# Patient Record
Sex: Female | Born: 1950 | Race: Black or African American | Hispanic: No | Smoking: Current every day smoker
Health system: Southern US, Community
[De-identification: ages and names within clinical notes are randomized; demographics above are authoritative.]

## PROBLEM LIST (undated history)

## (undated) DIAGNOSIS — I10 Essential (primary) hypertension: Secondary | ICD-10-CM

## (undated) DIAGNOSIS — M1990 Unspecified osteoarthritis, unspecified site: Secondary | ICD-10-CM

## (undated) DIAGNOSIS — I4891 Unspecified atrial fibrillation: Secondary | ICD-10-CM

## (undated) DIAGNOSIS — R32 Unspecified urinary incontinence: Secondary | ICD-10-CM

## (undated) DIAGNOSIS — R0600 Dyspnea, unspecified: Secondary | ICD-10-CM

## (undated) DIAGNOSIS — J449 Chronic obstructive pulmonary disease, unspecified: Secondary | ICD-10-CM

## (undated) DIAGNOSIS — C801 Malignant (primary) neoplasm, unspecified: Secondary | ICD-10-CM

## (undated) DIAGNOSIS — F329 Major depressive disorder, single episode, unspecified: Secondary | ICD-10-CM

## (undated) DIAGNOSIS — F32A Depression, unspecified: Secondary | ICD-10-CM

## (undated) DIAGNOSIS — E119 Type 2 diabetes mellitus without complications: Secondary | ICD-10-CM

## (undated) HISTORY — DX: Unspecified urinary incontinence: R32

## (undated) HISTORY — PX: CHOLECYSTECTOMY: SHX55

## (undated) HISTORY — PX: FRACTURE SURGERY: SHX138

---

## 2002-06-04 ENCOUNTER — Encounter: Payer: Self-pay | Admitting: *Deleted

## 2002-06-04 ENCOUNTER — Ambulatory Visit (HOSPITAL_COMMUNITY): Admission: RE | Admit: 2002-06-04 | Discharge: 2002-06-04 | Payer: Self-pay | Admitting: *Deleted

## 2003-12-07 ENCOUNTER — Ambulatory Visit (HOSPITAL_COMMUNITY): Admission: RE | Admit: 2003-12-07 | Discharge: 2003-12-07 | Payer: Self-pay | Admitting: Family Medicine

## 2011-06-22 DIAGNOSIS — J9859 Other diseases of mediastinum, not elsewhere classified: Secondary | ICD-10-CM | POA: Insufficient documentation

## 2011-06-22 DIAGNOSIS — S065XAA Traumatic subdural hemorrhage with loss of consciousness status unknown, initial encounter: Secondary | ICD-10-CM | POA: Insufficient documentation

## 2011-06-22 DIAGNOSIS — S2249XA Multiple fractures of ribs, unspecified side, initial encounter for closed fracture: Secondary | ICD-10-CM | POA: Insufficient documentation

## 2011-06-23 DIAGNOSIS — S22009A Unspecified fracture of unspecified thoracic vertebra, initial encounter for closed fracture: Secondary | ICD-10-CM | POA: Insufficient documentation

## 2012-12-17 ENCOUNTER — Encounter (HOSPITAL_COMMUNITY): Payer: Self-pay | Admitting: *Deleted

## 2012-12-17 ENCOUNTER — Emergency Department (HOSPITAL_COMMUNITY)
Admission: EM | Admit: 2012-12-17 | Discharge: 2012-12-17 | Disposition: A | Discharge status: Home / Self Care | Payer: Medicaid Other | Attending: Emergency Medicine | Admitting: Emergency Medicine

## 2012-12-17 DIAGNOSIS — E119 Type 2 diabetes mellitus without complications: Secondary | ICD-10-CM | POA: Insufficient documentation

## 2012-12-17 DIAGNOSIS — F329 Major depressive disorder, single episode, unspecified: Secondary | ICD-10-CM | POA: Insufficient documentation

## 2012-12-17 DIAGNOSIS — F3289 Other specified depressive episodes: Secondary | ICD-10-CM | POA: Insufficient documentation

## 2012-12-17 DIAGNOSIS — I10 Essential (primary) hypertension: Secondary | ICD-10-CM | POA: Insufficient documentation

## 2012-12-17 DIAGNOSIS — Z79899 Other long term (current) drug therapy: Secondary | ICD-10-CM | POA: Insufficient documentation

## 2012-12-17 DIAGNOSIS — F17200 Nicotine dependence, unspecified, uncomplicated: Secondary | ICD-10-CM | POA: Insufficient documentation

## 2012-12-17 HISTORY — DX: Major depressive disorder, single episode, unspecified: F32.9

## 2012-12-17 HISTORY — DX: Depression, unspecified: F32.A

## 2012-12-17 LAB — BASIC METABOLIC PANEL
CO2: 31 mEq/L (ref 19–32)
Calcium: 9.7 mg/dL (ref 8.4–10.5)
Creatinine, Ser: 0.73 mg/dL (ref 0.50–1.10)
GFR calc Af Amer: >90 mL/min (ref >90)
GFR calc non Af Amer: >90 mL/min (ref >90)

## 2012-12-17 MED ORDER — HYDROCHLOROTHIAZIDE 12.5 MG PO TABS: 25.0000 mg | ORAL_TABLET | Freq: Every day | ORAL | Status: DC

## 2012-12-17 MED ORDER — AMLODIPINE BESYLATE 5 MG PO TABS: 10.0000 mg | ORAL_TABLET | Freq: Every day | ORAL | Status: DC

## 2012-12-17 MED ORDER — HYDROCHLOROTHIAZIDE 25 MG PO TABS
25.0000 mg | ORAL_TABLET | Freq: Every day | ORAL | Status: DC
  Administered 2012-12-17: 25 mg via ORAL
  Filled 2012-12-17 (×4): qty 1

## 2012-12-17 MED ORDER — AMLODIPINE BESYLATE 5 MG PO TABS
10.0000 mg | ORAL_TABLET | Freq: Once | ORAL | Status: AC
  Administered 2012-12-17: 10 mg via ORAL
  Filled 2012-12-17: qty 2

## 2012-12-17 MED ORDER — ENALAPRILAT 1.25 MG/ML IV SOLN
1.2500 mg | Freq: Once | INTRAVENOUS | Status: AC
  Administered 2012-12-17: 1.25 mg via INTRAVENOUS
  Filled 2012-12-17: qty 2

## 2012-12-17 MED ORDER — METFORMIN HCL 500 MG PO TABS: 500.0000 mg | ORAL_TABLET | Freq: Two times a day (BID) | ORAL | Status: DC

## 2012-12-17 MED ORDER — LISINOPRIL 10 MG PO TABS: 10.0000 mg | ORAL_TABLET | Freq: Every day | ORAL | Status: DC

## 2012-12-17 NOTE — ED Notes (Signed)
Pt recently had blood pressure checked, PTA and states it was 250/132. BP in triage 222/102. Pt last took BP meds 2 days ago. States she ran out of some meds and has been unable to pick them up due to inability to afford to get them.

## 2012-12-17 NOTE — ED Provider Notes (Signed)
CSN: [id number]     Arrival date & time 12/17/12  1143 History   First MD Initiated Contact with Patient 12/17/12 1248     Chief Complaint  Patient presents with  . Hypertension   (Consider location/radiation/quality/duration/timing/severity/associated sxs/prior Treatment) HPI Comments: 62 year old female who is a known hypertensive and diabetic who admittedly has not been taking her medications regularly in over one month. She takes them every couple of days when she remembers but states that because of financial issues she is trying to make her medications last as long as possible. She has no ill feelings at all, no chest pain, shortness of breath, headache, nausea, vomiting, abdominal pain, neck pain, back pain, numbness, weakness, rash, dysuria, diarrhea, blurred vision. She is unaware of her blood pressure until she went to get disability evaluation at her family doctor today when they found her blood pressure to be 250/132. On arrival here it was 222/102. Her last use of medication was 2 days ago, she does smoke cigarettes, denies using alcohol, hypertension is a chronic problem for her.  Patient is a 62 y.o. female presenting with hypertension. The history is provided by the patient and a relative.  Hypertension    Past Medical History  Diagnosis Date  . Hypertension   . Diabetes mellitus without complication   . Depression    Past Surgical History  Procedure Laterality Date  . Cholecystectomy     No family history on file. History  Substance Use Topics  . Smoking status: Current Every Day Smoker    Types: Cigarettes  . Smokeless tobacco: Not on file  . Alcohol Use: No   OB History   Grav Para Term Preterm Abortions TAB SAB Ect Mult Living                 Review of Systems  All other systems reviewed and are negative.    Allergies  Review of patient's allergies indicates no known allergies.  Home Medications   Current Outpatient Rx  Name  Route  Sig  Dispense   Refill  . albuterol (PROVENTIL HFA;VENTOLIN HFA) 108 (90 BASE) MCG/ACT inhaler   Inhalation   Inhale 2 puffs into the lungs every 6 (six) hours as needed for wheezing.         Marland Kitchen amLODipine (NORVASC) 10 MG tablet   Oral   Take 10 mg by mouth daily.         . Aspirin-Salicylamide-Caffeine (BC HEADACHE POWDER PO)   Oral   Take 1 packet by mouth every 8 (eight) hours as needed (Pain).         . citalopram (CELEXA) 40 MG tablet   Oral   Take 40 mg by mouth daily.         Marland Kitchen lisinopril-hydrochlorothiazide (PRINZIDE,ZESTORETIC) 10-12.5 MG per tablet   Oral   Take 1 tablet by mouth daily.         . meloxicam (MOBIC) 7.5 MG tablet   Oral   Take 7.5 mg by mouth daily.         Marland Kitchen oxybutynin (DITROPAN) 5 MG tablet   Oral   Take 5 mg by mouth 2 (two) times daily.          . pravastatin (PRAVACHOL) 40 MG tablet   Oral   Take 40 mg by mouth daily.         . traMADol (ULTRAM) 50 MG tablet   Oral   Take 50 mg by mouth every 6 (six) hours as needed  for pain.         Marland Kitchen amLODipine (NORVASC) 5 MG tablet   Oral   Take 2 tablets (10 mg total) by mouth daily.   30 tablet   1   . hydrochlorothiazide (HYDRODIURIL) 12.5 MG tablet   Oral   Take 2 tablets (25 mg total) by mouth daily.   30 tablet   1   . lisinopril (PRINIVIL,ZESTRIL) 10 MG tablet   Oral   Take 1 tablet (10 mg total) by mouth daily.   30 tablet   1   . metFORMIN (GLUCOPHAGE) 500 MG tablet   Oral   Take 1 tablet (500 mg total) by mouth 2 (two) times daily with a meal.   60 tablet   1    BP 186/92  Pulse 76  Temp(Src) 98.2 F (36.8 C) (Oral)  Resp 20  SpO2 98% Physical Exam  Nursing note and vitals reviewed. Constitutional: She appears well-developed and well-nourished. No distress.  HENT:  Head: Normocephalic and atraumatic.  Mouth/Throat: Oropharynx is clear and moist. No oropharyngeal exudate.  Eyes: Conjunctivae and EOM are normal. Pupils are equal, round, and reactive to light. Right eye  exhibits no discharge. Left eye exhibits no discharge. No scleral icterus.  Neck: Normal range of motion. Neck supple. No JVD present. No thyromegaly present.  Cardiovascular: Normal rate, regular rhythm, normal heart sounds and intact distal pulses.  Exam reveals no gallop and no friction rub.   No murmur heard. Pulmonary/Chest: Effort normal and breath sounds normal. No respiratory distress. She has no wheezes. She has no rales.  Abdominal: Soft. Bowel sounds are normal. She exhibits no distension and no mass. There is no tenderness.  Musculoskeletal: Normal range of motion. She exhibits no edema and no tenderness.  Lymphadenopathy:    She has no cervical adenopathy.  Neurological: She is alert. Coordination normal.  Skin: Skin is warm and dry. No rash noted. No erythema.  Psychiatric: She has a normal mood and affect. Her behavior is normal.    ED Course  Procedures (including critical care time) Labs Review Labs Reviewed  BASIC METABOLIC PANEL - Abnormal; Notable for the following:    Glucose, Bld 102 (*)    All other components within normal limits   Imaging Review No results found.  MDM   1. Hypertension    Well appearing, VS with severe htn, but no other acute findings on exam or history to suggest end organ dysfunction - on metformin, but no urinary frequency or excessive thirst.  I have counseled pt to stop smoking and allocate those funds to her medications.  Filed Vitals:   12/17/12 1214 12/17/12 1330 12/17/12 1423 12/17/12 1522  BP: 217/112 196/97 203/99 186/92  Pulse:    76  Temp:      TempSrc:      Resp:    20  SpO2:    98%    Blood pressure has steadily improved, medications are given and refills given on her home prescriptions. Her blood sugar is normal, kidney function is preserved and the patient is alert, oriented and asymptomatic.   Meds given in ED:  Medications  hydrochlorothiazide (HYDRODIURIL) tablet 25 mg (25 mg Oral Given 12/17/12 1423)   enalaprilat (VASOTEC) injection 1.25 mg (1.25 mg Intravenous Given 12/17/12 1325)  amLODipine (NORVASC) tablet 10 mg (10 mg Oral Given 12/17/12 1325)    New Prescriptions   AMLODIPINE (NORVASC) 5 MG TABLET    Take 2 tablets (10 mg total) by mouth daily.  HYDROCHLOROTHIAZIDE (HYDRODIURIL) 12.5 MG TABLET    Take 2 tablets (25 mg total) by mouth daily.   LISINOPRIL (PRINIVIL,ZESTRIL) 10 MG TABLET    Take 1 tablet (10 mg total) by mouth daily.      Vida Roller, MD 12/17/12 [phone]

## 2013-03-31 ENCOUNTER — Emergency Department (HOSPITAL_COMMUNITY): Payer: Medicaid Other

## 2013-03-31 ENCOUNTER — Encounter (HOSPITAL_COMMUNITY): Payer: Self-pay | Admitting: Emergency Medicine

## 2013-03-31 ENCOUNTER — Inpatient Hospital Stay (HOSPITAL_COMMUNITY)
Admit: 2013-03-31 | Discharge: 2013-03-31 | Disposition: A | Discharge status: Home / Self Care | Payer: Medicaid Other | Attending: Internal Medicine | Admitting: Internal Medicine

## 2013-03-31 ENCOUNTER — Observation Stay (HOSPITAL_COMMUNITY): Payer: Medicaid Other

## 2013-03-31 ENCOUNTER — Inpatient Hospital Stay (HOSPITAL_COMMUNITY)
Admission: EM | Admit: 2013-03-31 | Discharge: 2013-04-02 | DRG: 312 | Disposition: A | Discharge status: Home / Self Care | Payer: Medicaid Other | Attending: Family Medicine | Admitting: Family Medicine

## 2013-03-31 DIAGNOSIS — Z7982 Long term (current) use of aspirin: Secondary | ICD-10-CM

## 2013-03-31 DIAGNOSIS — N390 Urinary tract infection, site not specified: Secondary | ICD-10-CM | POA: Diagnosis present

## 2013-03-31 DIAGNOSIS — E876 Hypokalemia: Secondary | ICD-10-CM | POA: Diagnosis present

## 2013-03-31 DIAGNOSIS — E7800 Pure hypercholesterolemia, unspecified: Secondary | ICD-10-CM | POA: Diagnosis present

## 2013-03-31 DIAGNOSIS — R51 Headache: Secondary | ICD-10-CM | POA: Diagnosis present

## 2013-03-31 DIAGNOSIS — R269 Unspecified abnormalities of gait and mobility: Secondary | ICD-10-CM | POA: Diagnosis present

## 2013-03-31 DIAGNOSIS — F17210 Nicotine dependence, cigarettes, uncomplicated: Secondary | ICD-10-CM | POA: Diagnosis present

## 2013-03-31 DIAGNOSIS — R55 Syncope and collapse: Principal | ICD-10-CM | POA: Diagnosis present

## 2013-03-31 DIAGNOSIS — W19XXXA Unspecified fall, initial encounter: Secondary | ICD-10-CM | POA: Diagnosis present

## 2013-03-31 DIAGNOSIS — Z720 Tobacco use: Secondary | ICD-10-CM | POA: Diagnosis present

## 2013-03-31 DIAGNOSIS — Z833 Family history of diabetes mellitus: Secondary | ICD-10-CM

## 2013-03-31 DIAGNOSIS — M129 Arthropathy, unspecified: Secondary | ICD-10-CM | POA: Diagnosis present

## 2013-03-31 DIAGNOSIS — Y92009 Unspecified place in unspecified non-institutional (private) residence as the place of occurrence of the external cause: Secondary | ICD-10-CM

## 2013-03-31 DIAGNOSIS — S42302A Unspecified fracture of shaft of humerus, left arm, initial encounter for closed fracture: Secondary | ICD-10-CM

## 2013-03-31 DIAGNOSIS — R195 Other fecal abnormalities: Secondary | ICD-10-CM

## 2013-03-31 DIAGNOSIS — D649 Anemia, unspecified: Secondary | ICD-10-CM | POA: Diagnosis present

## 2013-03-31 DIAGNOSIS — F17200 Nicotine dependence, unspecified, uncomplicated: Secondary | ICD-10-CM | POA: Diagnosis present

## 2013-03-31 DIAGNOSIS — D509 Iron deficiency anemia, unspecified: Secondary | ICD-10-CM | POA: Diagnosis present

## 2013-03-31 DIAGNOSIS — S42309A Unspecified fracture of shaft of humerus, unspecified arm, initial encounter for closed fracture: Secondary | ICD-10-CM | POA: Diagnosis present

## 2013-03-31 DIAGNOSIS — F329 Major depressive disorder, single episode, unspecified: Secondary | ICD-10-CM | POA: Diagnosis present

## 2013-03-31 DIAGNOSIS — F3289 Other specified depressive episodes: Secondary | ICD-10-CM | POA: Diagnosis present

## 2013-03-31 DIAGNOSIS — I10 Essential (primary) hypertension: Secondary | ICD-10-CM | POA: Diagnosis present

## 2013-03-31 DIAGNOSIS — T3995XA Adverse effect of unspecified nonopioid analgesic, antipyretic and antirheumatic, initial encounter: Secondary | ICD-10-CM | POA: Diagnosis present

## 2013-03-31 DIAGNOSIS — E119 Type 2 diabetes mellitus without complications: Secondary | ICD-10-CM | POA: Diagnosis present

## 2013-03-31 HISTORY — DX: Essential (primary) hypertension: I10

## 2013-03-31 HISTORY — DX: Type 2 diabetes mellitus without complications: E11.9

## 2013-03-31 HISTORY — DX: Unspecified osteoarthritis, unspecified site: M19.90

## 2013-03-31 LAB — CBC WITH DIFFERENTIAL/PLATELET
Basophils Absolute: 0 10*3/uL (ref 0.0–0.1)
Eosinophils Relative: 0 % (ref 0–5)
Lymphocytes Relative: 10 % — ABNORMAL LOW (ref 12–46)
MCV: 87 fL (ref 78.0–100.0)
Neutrophils Relative %: 84 % — ABNORMAL HIGH (ref 43–77)
Platelets: 187 10*3/uL (ref 150–400)
RDW: 15.1 % (ref 11.5–15.5)
WBC: 18.2 10*3/uL — ABNORMAL HIGH (ref 4.0–10.5)

## 2013-03-31 LAB — COMPREHENSIVE METABOLIC PANEL
ALT: 10 U/L (ref 0–35)
AST: 15 U/L (ref 0–37)
CO2: 27 mEq/L (ref 19–32)
Calcium: 9.2 mg/dL (ref 8.4–10.5)
GFR calc non Af Amer: >90 mL/min (ref >90)
Potassium: 3.3 mEq/L — ABNORMAL LOW (ref 3.7–5.3)
Sodium: 132 mEq/L — ABNORMAL LOW (ref 137–147)
Total Protein: 7.3 g/dL (ref 6.0–8.3)

## 2013-03-31 LAB — HEMOGLOBIN A1C: Hgb A1c MFr Bld: 5.9 % — ABNORMAL HIGH (ref <5.7)

## 2013-03-31 LAB — IRON AND TIBC
Saturation Ratios: 5 % — ABNORMAL LOW (ref 20–55)
TIBC: 215 ug/dL — ABNORMAL LOW (ref 250–470)

## 2013-03-31 LAB — TSH: TSH: 0.484 u[IU]/mL (ref 0.350–4.500)

## 2013-03-31 LAB — RETICULOCYTES
RBC.: 3.63 MIL/uL — ABNORMAL LOW (ref 3.87–5.11)
Retic Count, Absolute: 61.7 10*3/uL (ref 19.0–186.0)
Retic Ct Pct: 1.7 % (ref 0.4–3.1)

## 2013-03-31 LAB — URINALYSIS, ROUTINE W REFLEX MICROSCOPIC
Nitrite: NEGATIVE
Specific Gravity, Urine: 1.025 (ref 1.005–1.030)
pH: 6 (ref 5.0–8.0)

## 2013-03-31 LAB — GLUCOSE, CAPILLARY
Glucose-Capillary: 293 mg/dL — ABNORMAL HIGH (ref 70–99)
Glucose-Capillary: 154 mg/dL — ABNORMAL HIGH (ref 70–99)

## 2013-03-31 LAB — TROPONIN I
Troponin I: <0.3 ng/mL (ref <0.30)
Troponin I: <0.3 ng/mL (ref <0.30)

## 2013-03-31 LAB — PROTIME-INR: INR: 0.96 (ref 0.00–1.49)

## 2013-03-31 LAB — URINE MICROSCOPIC-ADD ON

## 2013-03-31 LAB — VITAMIN B12: Vitamin B-12: 370 pg/mL (ref 211–911)

## 2013-03-31 LAB — FOLATE: Folate: 4.8 ng/mL

## 2013-03-31 LAB — FERRITIN: Ferritin: 784 ng/mL — ABNORMAL HIGH (ref 10–291)

## 2013-03-31 MED ORDER — ACETAMINOPHEN 650 MG RE SUPP: 650.0000 mg | Freq: Four times a day (QID) | RECTAL | Status: DC | PRN

## 2013-03-31 MED ORDER — PANTOPRAZOLE SODIUM 40 MG IV SOLR
40.0000 mg | Freq: Two times a day (BID) | INTRAVENOUS | Status: DC
  Filled 2013-03-31: qty 40

## 2013-03-31 MED ORDER — POTASSIUM CHLORIDE CRYS ER 20 MEQ PO TBCR
40.0000 meq | EXTENDED_RELEASE_TABLET | Freq: Two times a day (BID) | ORAL | Status: AC
  Administered 2013-03-31 – 2013-04-02 (×5): 40 meq via ORAL
  Filled 2013-03-31 (×5): qty 2

## 2013-03-31 MED ORDER — ONDANSETRON HCL 4 MG/2ML IJ SOLN: 4.0000 mg | Freq: Four times a day (QID) | INTRAMUSCULAR | Status: DC | PRN

## 2013-03-31 MED ORDER — FAMOTIDINE IN NACL 20-0.9 MG/50ML-% IV SOLN
20.0000 mg | INTRAVENOUS | Status: AC
  Administered 2013-03-31: 20 mg via INTRAVENOUS
  Filled 2013-03-31: qty 50

## 2013-03-31 MED ORDER — ALUM & MAG HYDROXIDE-SIMETH 200-200-20 MG/5ML PO SUSP: 30.0000 mL | Freq: Four times a day (QID) | ORAL | Status: DC | PRN

## 2013-03-31 MED ORDER — ACETAMINOPHEN 325 MG PO TABS: 650.0000 mg | ORAL_TABLET | Freq: Four times a day (QID) | ORAL | Status: DC | PRN

## 2013-03-31 MED ORDER — SODIUM CHLORIDE 0.9 % IJ SOLN
3.0000 mL | Freq: Two times a day (BID) | INTRAMUSCULAR | Status: DC
  Administered 2013-03-31 – 2013-04-02 (×4): 3 mL via INTRAVENOUS

## 2013-03-31 MED ORDER — NICOTINE 14 MG/24HR TD PT24
14.0000 mg | MEDICATED_PATCH | Freq: Every day | TRANSDERMAL | Status: DC
  Administered 2013-03-31 – 2013-04-02 (×3): 14 mg via TRANSDERMAL
  Filled 2013-03-31 (×3): qty 1

## 2013-03-31 MED ORDER — HYDROCODONE-ACETAMINOPHEN 5-325 MG PO TABS: 1.0000 | ORAL_TABLET | ORAL | Status: DC | PRN

## 2013-03-31 MED ORDER — CITALOPRAM HYDROBROMIDE 20 MG PO TABS
40.0000 mg | ORAL_TABLET | Freq: Every day | ORAL | Status: DC
  Administered 2013-03-31 – 2013-04-02 (×3): 40 mg via ORAL
  Filled 2013-03-31: qty 2
  Filled 2013-03-31: qty 1
  Filled 2013-03-31: qty 2
  Filled 2013-03-31: qty 1
  Filled 2013-03-31: qty 2

## 2013-03-31 MED ORDER — DOCUSATE SODIUM 100 MG PO CAPS
100.0000 mg | ORAL_CAPSULE | Freq: Two times a day (BID) | ORAL | Status: DC
  Administered 2013-03-31 – 2013-04-02 (×5): 100 mg via ORAL
  Filled 2013-03-31 (×8): qty 1

## 2013-03-31 MED ORDER — SIMVASTATIN 20 MG PO TABS
20.0000 mg | ORAL_TABLET | Freq: Every day | ORAL | Status: DC
  Administered 2013-03-31 – 2013-04-01 (×2): 20 mg via ORAL
  Filled 2013-03-31 (×3): qty 1

## 2013-03-31 MED ORDER — CEFTRIAXONE SODIUM 1 G IJ SOLR
1.0000 g | INTRAMUSCULAR | Status: DC
  Administered 2013-03-31 – 2013-04-02 (×3): 1 g via INTRAVENOUS
  Filled 2013-03-31 (×6): qty 10

## 2013-03-31 MED ORDER — PANTOPRAZOLE SODIUM 40 MG PO TBEC
40.0000 mg | DELAYED_RELEASE_TABLET | Freq: Every day | ORAL | Status: DC
  Administered 2013-03-31 – 2013-04-01 (×2): 40 mg via ORAL
  Filled 2013-03-31 (×3): qty 1

## 2013-03-31 MED ORDER — INSULIN ASPART 100 UNIT/ML ~~LOC~~ SOLN
0.0000 [IU] | Freq: Every day | SUBCUTANEOUS | Status: DC
  Administered 2013-03-31: 2 [IU] via SUBCUTANEOUS

## 2013-03-31 MED ORDER — INSULIN ASPART 100 UNIT/ML ~~LOC~~ SOLN
0.0000 [IU] | Freq: Three times a day (TID) | SUBCUTANEOUS | Status: DC
  Administered 2013-03-31: 8 [IU] via SUBCUTANEOUS
  Administered 2013-03-31 – 2013-04-01 (×2): 3 [IU] via SUBCUTANEOUS
  Administered 2013-04-01: 2 [IU] via SUBCUTANEOUS
  Administered 2013-04-02: 3 [IU] via SUBCUTANEOUS

## 2013-03-31 MED ORDER — PANTOPRAZOLE SODIUM 40 MG IV SOLR
40.0000 mg | INTRAVENOUS | Status: AC
  Administered 2013-03-31: 40 mg via INTRAVENOUS
  Filled 2013-03-31: qty 40

## 2013-03-31 MED ORDER — ONDANSETRON HCL 4 MG PO TABS: 4.0000 mg | ORAL_TABLET | Freq: Four times a day (QID) | ORAL | Status: DC | PRN

## 2013-03-31 MED ORDER — SODIUM CHLORIDE 0.9 % IV SOLN
INTRAVENOUS | Status: AC
  Administered 2013-03-31: 06:00:00 via INTRAVENOUS

## 2013-03-31 MED ORDER — HYDROMORPHONE HCL PF 1 MG/ML IJ SOLN
0.5000 mg | INTRAMUSCULAR | Status: DC | PRN
  Administered 2013-03-31: 0.5 mg via INTRAVENOUS
  Filled 2013-03-31: qty 1

## 2013-03-31 NOTE — ED Notes (Addendum)
Patient states she fell tonight getting out of bed and is complaining of left arm pain. Patient has noted deformity to upper left arm. Patient given Fentanyl IV en route via EMS.

## 2013-03-31 NOTE — Progress Notes (Signed)
OT Cancellation Note  Patient Details Name: Holly May MRN: [id number] DOB: 05-21-1950   Cancelled Treatment:    Reason Eval/Treat Not Completed: Patient not medically ready (Patient with ortho consult this date for UE fracture.  No specifics given for therapy at this time.  nursing reports MD states he wants to address syncope first then UE,.   Will defer OT eval at this time until UE addressed and parameters/precautions given.  Please reconsult when patient medically appropriate.  Thank you for this referral.    Velora Mediate, OTR/L  03/31/2013, 9:37 AM

## 2013-03-31 NOTE — ED Notes (Signed)
Patient O2 saturation 92-93% on room air. Applied 2L O2 via nasal canula. Patient's O2 saturation increased to 97%.

## 2013-03-31 NOTE — Care Management Note (Signed)
    Page 1 of 1   03/31/2013     11:21:22 AM   CARE MANAGEMENT NOTE 03/31/2013  Patient:  NHYIRA, LEANO   Account Number:  [id number]  Date Initiated:  03/31/2013  Documentation initiated by:  Sharrie Rothman  Subjective/Objective Assessment:   Pt admitted from home with syncope and UTI. Pt lives with her husband and will return home at discharge. Pt is independent with ADL's.     Action/Plan:   Pt may require surgery for broken arm. Will continue to follow for  [facility] and DME needs once plan for surgery has been devised.   Anticipated DC Date:  04/05/2013   Anticipated DC Plan:  HOME/SELF CARE      DC Planning Services  CM consult      Choice offered to / List presented to:             Status of service:  Completed, signed off Medicare Important Message given?   (If response is "NO", the following Medicare IM given date fields will be blank) Date Medicare IM given:   Date Additional Medicare IM given:    Discharge Disposition:  HOME/SELF CARE  Per UR Regulation:    If discussed at Long Length of Stay Meetings, dates discussed:    Comments:  03/31/13 1120 Arlyss Queen, RN BSN CM

## 2013-03-31 NOTE — Progress Notes (Signed)
EEG Completed; Results Pending  

## 2013-03-31 NOTE — Progress Notes (Signed)
INITIAL NUTRITION ASSESSMENT  DOCUMENTATION CODES Per approved criteria  -Not Applicable   INTERVENTION: CHO Modified diet per MD and provide snacks as needed between meals  NUTRITION DIAGNOSIS: Inadequate oral intake related to decreased appetite as evidenced by pt nutrition hx   Goal: Pt to meet >/= 90% of their estimated nutrition needs   Monitor:  Po intake, labs and wt trends  Reason for Assessment: Malnutrition Screen Score = 3  62 y.o. female  Admitting Dx: Syncope  ASSESSMENT:  Pt is 62 yo female with diagnosis of syncope and  hx of wt loss 30-40# per H&P? Her reported wt loss is not consistent. She told RD UBW of 212# last March and says she began losing wt for no apparent reason. Therefore question her actual percentage change. She lives with husband and son in [place]. Her husband is present and reports pt appetite and po intake has decreased over past 2 weeks. She is able to provide limited hx. No physical evidence of malnutrition noted. Will continue to follow for her nutrition care.  Height: Ht Readings from Last 1 Encounters:  03/31/13 5\' 9"  (1.753 m)    Weight: Wt Readings from Last 1 Encounters:  03/31/13 143 lb (64.864 kg)    Ideal Body Weight: 145# (65.9 kg)  % Ideal Body Weight: 99%  Wt Readings from Last 10 Encounters:  03/31/13 143 lb (64.864 kg)    Usual Body Weight: 212# ( March 2013) per pt  BMI:  Body mass index is 21.11 kg/(m^2).normal range  Estimated Nutritional Needs: Kcal: 1600-1800 Protein: 65 gr Fluid: 2000 ml daily  Skin: No issues noted  Diet Order: Carb Control (po 50%) per pt  EDUCATION NEEDS: -Education needs addressed  No intake or output data in the 24 hours ending 03/31/13 0926  Last BM: 03/30/13  Labs:   Recent Labs Lab 03/31/13 0358  NA 132*  K 3.3*  CL 91*  CO2 27  BUN 29*  CREATININE 0.75  CALCIUM 9.2  GLUCOSE 193*    CBG (last 3)   Recent Labs  03/31/13 0750  GLUCAP 293*     Scheduled Meds: . cefTRIAXone (ROCEPHIN)  IV  1 g Intravenous Q24H  . citalopram  40 mg Oral Daily  . docusate sodium  100 mg Oral BID  . insulin aspart  0-15 Units Subcutaneous TID WC  . insulin aspart  0-5 Units Subcutaneous QHS  . nicotine  14 mg Transdermal Daily  . pantoprazole (PROTONIX) IV  40 mg Intravenous Q12H  . potassium chloride  40 mEq Oral BID  . simvastatin  20 mg Oral q1800  . sodium chloride  3 mL Intravenous Q12H    Continuous Infusions: . sodium chloride      Past Medical History  Diagnosis Date  . Hypertension   . Diabetes mellitus without complication   . Depression   . Arthritis     Past Surgical History  Procedure Laterality Date  . Cholecystectomy      Royann Shivers MS,RD,CSG,LDN Office: [phone] Pager: [phone]

## 2013-03-31 NOTE — Consult Note (Signed)
Referring Provider: Dr. Rito Ehrlich Primary Care Physician:  [facility] Primary Gastroenterologist:  Dr. Darrick Penna   Date of Admission: 03/31/13 Date of Consultation: 03/31/13  Reason for Consultation:  Anemia, heme positive stool  HPI:  Holly May is a 62 year old female who presented after a syncopal episode resulting in a fall. Poor historian. Dr. Romeo Apple consulted due to segmental humerus fracture, left upper extremity. Needs internal fixation likely; cardiology consult requested.   Admitting Hgb 10.8. Normocytic anemia. Heme positive stool. No overt GI bleeding. No prior colonoscopy or upper endoscopy. No hematochezia. No melena. BC powders daily for headaches, sometimes two at a time. Reports since March 2013 lost "over 100 lbs". Clothes falling off her. Unintentional weight loss. Poor appetite, decreased po intake. Early satiety. Nausea and vomiting worse this week. Intermittent N/V. No dysphagia. No reflux symptoms. Sometimes vomiting after taking pills. No pain with eating. States sometimes 3-4 weeks without bowel movement. Has felt weak for about 2 weeks.    Past Medical History  Diagnosis Date  . Hypertension   . Diabetes mellitus without complication   . Depression   . Arthritis     Past Surgical History  Procedure Laterality Date  . Cholecystectomy      Prior to Admission medications   Medication Sig Start Date End Date Taking? Authorizing Provider  carvedilol (COREG) 12.5 MG tablet Take 12.5 mg by mouth 2 (two) times daily with a meal.   Yes Historical Provider, MD  lovastatin (MEVACOR) 40 MG tablet Take 40 mg by mouth at bedtime.   Yes Historical Provider, MD  albuterol (PROVENTIL HFA;VENTOLIN HFA) 108 (90 BASE) MCG/ACT inhaler Inhale 2 puffs into the lungs every 6 (six) hours as needed for wheezing.    Historical Provider, MD  amLODipine (NORVASC) 10 MG tablet Take 10 mg by mouth daily.    Historical Provider, MD  Aspirin-Salicylamide-Caffeine (BC  HEADACHE POWDER PO) Take 1 packet by mouth every 8 (eight) hours as needed (Pain).    Historical Provider, MD  citalopram (CELEXA) 40 MG tablet Take 40 mg by mouth daily.    Historical Provider, MD  hydrochlorothiazide (HYDRODIURIL) 12.5 MG tablet Take 2 tablets (25 mg total) by mouth daily. 12/17/12   Vida Roller, MD  lisinopril (PRINIVIL,ZESTRIL) 10 MG tablet Take 1 tablet (10 mg total) by mouth daily. 12/17/12   Vida Roller, MD  meloxicam (MOBIC) 7.5 MG tablet Take 7.5 mg by mouth daily.    Historical Provider, MD  metFORMIN (GLUCOPHAGE) 500 MG tablet Take 1 tablet (500 mg total) by mouth 2 (two) times daily with a meal. 12/17/12   Vida Roller, MD  oxybutynin (DITROPAN) 5 MG tablet Take 5 mg by mouth 2 (two) times daily.     Historical Provider, MD  traMADol (ULTRAM) 50 MG tablet Take 50 mg by mouth every 6 (six) hours as needed for pain.    Historical Provider, MD    Current Facility-Administered Medications  Medication Dose Route Frequency Provider Last Rate Last Dose  . 0.9 %  sodium chloride infusion   Intravenous Continuous Osvaldo Shipper, MD      . acetaminophen (TYLENOL) tablet 650 mg  650 mg Oral Q6H PRN Osvaldo Shipper, MD       Or  . acetaminophen (TYLENOL) suppository 650 mg  650 mg Rectal Q6H PRN Osvaldo Shipper, MD      . alum & mag hydroxide-simeth (MAALOX/MYLANTA) 200-200-20 MG/5ML suspension 30 mL  30 mL Oral Q6H PRN Osvaldo Shipper, MD      .  cefTRIAXone (ROCEPHIN) 1 g in dextrose 5 % 50 mL IVPB  1 g Intravenous Q24H Nishant Dhungel, MD      . citalopram (CELEXA) tablet 40 mg  40 mg Oral Daily Osvaldo Shipper, MD      . docusate sodium (COLACE) capsule 100 mg  100 mg Oral BID Osvaldo Shipper, MD      . HYDROcodone-acetaminophen (NORCO/VICODIN) 5-325 MG per tablet 1-2 tablet  1-2 tablet Oral Q4H PRN Osvaldo Shipper, MD      . HYDROmorphone (DILAUDID) injection 0.5 mg  0.5 mg Intravenous Q4H PRN Osvaldo Shipper, MD      . insulin aspart (novoLOG) injection 0-15 Units  0-15  Units Subcutaneous TID WC Osvaldo Shipper, MD      . insulin aspart (novoLOG) injection 0-5 Units  0-5 Units Subcutaneous QHS Osvaldo Shipper, MD      . nicotine (NICODERM CQ - dosed in mg/24 hours) patch 14 mg  14 mg Transdermal Daily Osvaldo Shipper, MD      . ondansetron (ZOFRAN) tablet 4 mg  4 mg Oral Q6H PRN Osvaldo Shipper, MD       Or  . ondansetron (ZOFRAN) injection 4 mg  4 mg Intravenous Q6H PRN Osvaldo Shipper, MD      . pantoprazole (PROTONIX) injection 40 mg  40 mg Intravenous Q12H Osvaldo Shipper, MD      . potassium chloride SA (K-DUR,KLOR-CON) CR tablet 40 mEq  40 mEq Oral BID Nishant Dhungel, MD      . simvastatin (ZOCOR) tablet 20 mg  20 mg Oral q1800 Osvaldo Shipper, MD      . sodium chloride 0.9 % injection 3 mL  3 mL Intravenous Q12H Osvaldo Shipper, MD        Allergies as of 03/31/2013  . (No Known Allergies)    Family History  Problem Relation Age of Onset  . Diabetes Sister   . Colon cancer Neg Hx     History   Social History  . Marital Status: Married    Spouse Name: N/A    Number of Children: N/A  . Years of Education: N/A   Occupational History  . Not on file.   Social History Main Topics  . Smoking status: Current Every Day Smoker -- 1.00 packs/day    Types: Cigarettes  . Smokeless tobacco: Not on file  . Alcohol Use: No  . Drug Use: No  . Sexual Activity: Not on file   Other Topics Concern  . Not on file   Social History Narrative  . No narrative on file    Review of Systems: Gen: see HPI CV: Denies chest pain, heart palpitations, syncope, edema  Resp: +cough, +DOE GI: see HPI GU : Denies urinary burning, urinary frequency, urinary incontinence.  MS: Denies joint pain,swelling, cramping Derm: Denies rash, itching, dry skin Psych: +depression  Physical Exam: Vital signs in last 24 hours: Temp:  [98 F (36.7 C)-98.1 F (36.7 C)] 98 F (36.7 C) (12/31 0747) Pulse Rate:  [74-88] 88 (12/31 0747) Resp:  [14-20] 20 (12/31 0747) BP:  (113-126)/(52-62) 113/52 mmHg (12/31 0747) SpO2:  [93 %-98 %] 98 % (12/31 0747) Weight:  [143 lb (64.864 kg)] 143 lb (64.864 kg) (12/31 0208) Last BM Date: 03/30/13 General:   Alert,  Well-developed, well-nourished, pleasant and cooperative in NAD Head:  Normocephalic and atraumatic. Eyes:  Sclera clear, no icterus.   Conjunctiva pink. Ears:  Normal auditory acuity. Nose:  No deformity, discharge,  or lesions. Mouth:  Poor dentition Neck:  Supple; no masses or thyromegaly. Lungs:  Scattered rhonchi bilaterally Heart:  S1 S2 present; no murmurs, clicks, rubs,  or gallops. Abdomen:  Soft, nontender and nondistended. No masses, hepatosplenomegaly or hernias noted. Normal bowel sounds, without guarding, and without rebound.   Rectal:  Deferred until time of colonoscopy.   Msk:  Symmetrical without gross deformities. Normal posture. Extremities:  Without clubbing or edema. Neurologic:  Alert and  oriented x4;  grossly normal neurologically. Skin:  Intact without significant lesions or rashes. Cervical Nodes:  No significant cervical adenopathy. Psych:  Alert and cooperative. Normal mood and affect.  Intake/Output from previous day:   Intake/Output this shift:    Lab Results:  Recent Labs  03/31/13 0358  WBC 18.2*  HGB 10.8*  HCT 31.5*  PLT 187   BMET  Recent Labs  03/31/13 0358  NA 132*  K 3.3*  CL 91*  CO2 27  GLUCOSE 193*  BUN 29*  CREATININE 0.75  CALCIUM 9.2   LFT  Recent Labs  03/31/13 0358  PROT 7.3  ALBUMIN 2.9*  AST 15  ALT 10  ALKPHOS 89  BILITOT 0.7   PT/INR  Recent Labs  03/31/13 0358  LABPROT 12.6  INR 0.96    Studies/Results: Ct Head Wo Contrast  03/31/2013   CLINICAL DATA:  Syncope and headaches.  [employer].  EXAM: CT HEAD WITHOUT CONTRAST  TECHNIQUE: Contiguous axial images were obtained from the base of the skull through the vertex without intravenous contrast.  COMPARISON:  None.  FINDINGS: Mild diffuse cerebral atrophy. No  ventricular dilatation. Old lacune or infarct in the deep white matter on the left. Patchy low-attenuation changes adjacent to the periventricular white matter consistent with small vessel ischemia. No mass effect or midline shift. No abnormal extra-axial fluid collections. Gray-white matter junctions are distinct. Basal cisterns are not effaced. No evidence of acute intracranial hemorrhage. No depressed skull fractures. Vascular calcifications. Visualized paranasal sinuses and mastoid air cells are not opacified.  IMPRESSION: No acute intracranial abnormalities. Chronic atrophy and small vessel ischemic changes.   Electronically Signed   By: Burman Nieves M.D.   On: 03/31/2013 05:51   Dg Chest Port 1 View  03/31/2013   CLINICAL DATA:  Chest pain after a fall.  EXAM: PORTABLE CHEST - 1 VIEW  COMPARISON:  None.  FINDINGS: The heart size and mediastinal contours are within normal limits. Both lungs are clear. The visualized skeletal structures are unremarkable. Calcification of the aorta.  IMPRESSION: No active disease.   Electronically Signed   By: Burman Nieves M.D.   On: 03/31/2013 04:12   Dg Humerus Left  03/31/2013   CLINICAL DATA:  Post reduction  EXAM: LEFT HUMERUS - 2+ VIEW  COMPARISON:  Prior radiograph performed earlier on the same day  FINDINGS: Casting material is now seen overlying the left humerus. Previously identified acute transverse fracture through the midshaft of the left humerus with medial angulation again seen. Overall medial angulation at this fracture site is improved. A 2nd oblique displaced fracture is now seen through the distal shaft of the left humerus. There is posterior displacement and anterior a medial angulation at this fracture. The midshaft of the humerus is now free floating. The elbow and shoulder remain intact.  IMPRESSION: 1. Improved medial angulation at the previous identified transverse fracture through the midshaft of the left humerus. 2. New oblique fracture  traversing the distal shaft of the left humerus with posterior displacement and anterior angulation.   Electronically Signed  By: Rise Mu M.D.   On: 03/31/2013 04:53   Dg Humerus Left  03/31/2013   CLINICAL DATA:  Left humerus deformity. Patient passed out and fell. Arm pain.  EXAM: LEFT HUMERUS - 2+ VIEW  COMPARISON:  None.  FINDINGS: There is a transverse fracture through the midshaft of the left humerus with medial angulation of the distal fracture fragments. Suggestion of a lucent structure over the distal humeral shaft but I believe this is associated with the nectar shaft consistent with a superimposed foreign body. Repeat radiograph should be obtained when the patient is able to exclude a focal bone lesion. No definite evidence that this represents a pathologic fracture. No dislocation of the shoulder.  IMPRESSION: Acute transverse fracture of the midshaft left humerus with medial angulation of the distal fracture fragments.   Electronically Signed   By: Burman Nieves M.D.   On: 03/31/2013 03:41    Impression/Plan: 62 year old female with mild, normocytic anemia, heme positive stool, and no overt signs of GI bleeding noted; iron studies are currently pending. Readily admits to daily BC powders, sometimes several a day, which raises the concern for gastritis, PUD, small bowel involvement. Vague symptoms of intermittent nausea, vomiting, and early satiety noted with associated weight loss per patient.   It is unclear her baseline Hgb; she has never had any lower or upper GI evaluation. As no overt bleeding noted, recommend she return to see [place] as an outpatient with plans for a colonoscopy and upper endoscopy. In the interim, needs to absolutely avoid BC powders and continue PPI daily as outpatient. We will be happy to arrange this once she is discharged from this current hospitalization. Doubt syncopal episode related to anemia, as it is mild. Agree with cardiology evaluation. Will  follow-up on iron studies as they come available.  We will arrange outpatient follow-up with [place]. Thank you for the opportunity to take part in her care.   Nira Retort, ANP-BC [facility]     LOS: 0 days    03/31/2013, 9:28 AM

## 2013-03-31 NOTE — ED Provider Notes (Signed)
CSN: [id number]     Arrival date & time 03/31/13  0206 History   First MD Initiated Contact with Patient 03/31/13 [phone]     Chief Complaint  Patient presents with  . Arm Pain   (Consider location/radiation/quality/duration/timing/severity/associated sxs/prior Treatment) HPI Comments: 62 year old female, has a history of diabetes, hypertension, hypercholesterolemia who does not go to a family physician for regular checkups who presents with a complaint of a syncopal episode. The patient states that over the last week she has had 2 discreet episodes where she has passed out falling to the ground unexpectedly. This evening she was standing at the refrigerator making a glass of water when she became acutely lightheaded and fell to the ground. She had no control over her body. She endorses a complete loss of consciousness, when she came to she was on the floor, had had bowel incontinence and a severely painful left arm. She states this is similar to the event that happened several days ago. She reports that she has had very loose dark colored stools recently, no abdominal pain chest pain palpitations headache but has felt mild weakness. Family reports that she uses a BC powder every morning at least once. Paramedics transported the patient who had a deformity of her left mid arm, she was given a small amount of intravenous fentanyl with some improvement of her symptoms.  Patient is a 62 y.o. female presenting with arm pain. The history is provided by the patient, a relative and the EMS personnel.  Arm Pain    Past Medical History  Diagnosis Date  . Hypertension   . Diabetes mellitus without complication   . Depression   . Arthritis    Past Surgical History  Procedure Laterality Date  . Cholecystectomy     Family History  Problem Relation Age of Onset  . Diabetes Sister    History  Substance Use Topics  . Smoking status: Current Every Day Smoker    Types: Cigarettes  . Smokeless tobacco:  Not on file  . Alcohol Use: No   OB History   Grav Para Term Preterm Abortions TAB SAB Ect Mult Living                 Review of Systems  All other systems reviewed and are negative.    Allergies  Review of patient's allergies indicates no known allergies.  Home Medications   Current Outpatient Rx  Name  Route  Sig  Dispense  Refill  . carvedilol (COREG) 12.5 MG tablet   Oral   Take 12.5 mg by mouth 2 (two) times daily with a meal.         . lovastatin (MEVACOR) 40 MG tablet   Oral   Take 40 mg by mouth at bedtime.         Marland Kitchen albuterol (PROVENTIL HFA;VENTOLIN HFA) 108 (90 BASE) MCG/ACT inhaler   Inhalation   Inhale 2 puffs into the lungs every 6 (six) hours as needed for wheezing.         Marland Kitchen amLODipine (NORVASC) 10 MG tablet   Oral   Take 10 mg by mouth daily.         . Aspirin-Salicylamide-Caffeine (BC HEADACHE POWDER PO)   Oral   Take 1 packet by mouth every 8 (eight) hours as needed (Pain).         . citalopram (CELEXA) 40 MG tablet   Oral   Take 40 mg by mouth daily.         Marland Kitchen  hydrochlorothiazide (HYDRODIURIL) 12.5 MG tablet   Oral   Take 2 tablets (25 mg total) by mouth daily.   30 tablet   1   . lisinopril (PRINIVIL,ZESTRIL) 10 MG tablet   Oral   Take 1 tablet (10 mg total) by mouth daily.   30 tablet   1   . meloxicam (MOBIC) 7.5 MG tablet   Oral   Take 7.5 mg by mouth daily.         . metFORMIN (GLUCOPHAGE) 500 MG tablet   Oral   Take 1 tablet (500 mg total) by mouth 2 (two) times daily with a meal.   60 tablet   1   . oxybutynin (DITROPAN) 5 MG tablet   Oral   Take 5 mg by mouth 2 (two) times daily.          . traMADol (ULTRAM) 50 MG tablet   Oral   Take 50 mg by mouth every 6 (six) hours as needed for pain.          BP 125/62  Pulse 74  Temp(Src) 98.1 F (36.7 C) (Oral)  Resp 15  Ht 5\' 9"  (1.753 m)  Wt 143 lb (64.864 kg)  BMI 21.11 kg/m2  SpO2 97% Physical Exam  Nursing note and vitals  reviewed. Constitutional: She appears well-developed and well-nourished. No distress.  HENT:  Head: Normocephalic and atraumatic.  Mouth/Throat: Oropharynx is clear and moist. No oropharyngeal exudate.  Eyes: Conjunctivae and EOM are normal. Pupils are equal, round, and reactive to light. Right eye exhibits no discharge. Left eye exhibits no discharge. No scleral icterus.  Neck: Normal range of motion. Neck supple. No JVD present. No thyromegaly present.  Cardiovascular: Normal rate, regular rhythm, normal heart sounds and intact distal pulses.  Exam reveals no gallop and no friction rub.   No murmur heard. Pulmonary/Chest: Effort normal and breath sounds normal. No respiratory distress. She has no wheezes. She has no rales.  Abdominal: Soft. Bowel sounds are normal. She exhibits no distension and no mass. There is no tenderness.  Genitourinary:  Chaperone present for exam, normal-appearing rectum, no fissures or hemorrhoids or masses palpated. Rectal exam without intraoral masses or hemorrhoids, stool appears liquid, dark orange in appearance  Musculoskeletal: Normal range of motion. She exhibits tenderness ( Tenderness and deformity of the mid left upper extremity, mid humerus. Pulses and sensation are normal at the left hand). She exhibits no edema.  Lymphadenopathy:    She has no cervical adenopathy.  Neurological: She is alert. Coordination normal.  Moves all extremities x4, normal strength 5 out of 5 in all extremities, speech is clear, mentation is normal, cranial nerves III through XII intact.  Skin: Skin is warm and dry. No rash noted. No erythema.  Psychiatric: She has a normal mood and affect. Her behavior is normal.    ED Course  Procedures (including critical care time) Labs Review Labs Reviewed  CBC WITH DIFFERENTIAL - Abnormal; Notable for the following:    WBC 18.2 (*)    RBC 3.62 (*)    Hemoglobin 10.8 (*)    HCT 31.5 (*)    Neutrophils Relative % 84 (*)    Neutro Abs  15.4 (*)    Lymphocytes Relative 10 (*)    All other components within normal limits  COMPREHENSIVE METABOLIC PANEL - Abnormal; Notable for the following:    Sodium 132 (*)    Potassium 3.3 (*)    Chloride 91 (*)    Glucose, Bld 193 (*)  BUN 29 (*)    Albumin 2.9 (*)    All other components within normal limits  APTT - Abnormal; Notable for the following:    aPTT 43 (*)    All other components within normal limits  URINALYSIS, ROUTINE W REFLEX MICROSCOPIC - Abnormal; Notable for the following:    Hgb urine dipstick LARGE (*)    Bilirubin Urine SMALL (*)    Ketones, ur TRACE (*)    Protein, ur 100 (*)    Urobilinogen, UA 4.0 (*)    Leukocytes, UA TRACE (*)    All other components within normal limits  URINE MICROSCOPIC-ADD ON - Abnormal; Notable for the following:    Squamous Epithelial / LPF FEW (*)    Bacteria, UA MANY (*)    Casts WAXY CAST (*)    All other components within normal limits  PROTIME-INR  TROPONIN I  TROPONIN I  TROPONIN I   Imaging Review Dg Chest Port 1 View  03/31/2013   CLINICAL DATA:  Chest pain after a fall.  EXAM: PORTABLE CHEST - 1 VIEW  COMPARISON:  None.  FINDINGS: The heart size and mediastinal contours are within normal limits. Both lungs are clear. The visualized skeletal structures are unremarkable. Calcification of the aorta.  IMPRESSION: No active disease.   Electronically Signed   By: Burman Nieves M.D.   On: 03/31/2013 04:12   Dg Humerus Left  03/31/2013   CLINICAL DATA:  Post reduction  EXAM: LEFT HUMERUS - 2+ VIEW  COMPARISON:  Prior radiograph performed earlier on the same day  FINDINGS: Casting material is now seen overlying the left humerus. Previously identified acute transverse fracture through the midshaft of the left humerus with medial angulation again seen. Overall medial angulation at this fracture site is improved. A 2nd oblique displaced fracture is now seen through the distal shaft of the left humerus. There is posterior  displacement and anterior a medial angulation at this fracture. The midshaft of the humerus is now free floating. The elbow and shoulder remain intact.  IMPRESSION: 1. Improved medial angulation at the previous identified transverse fracture through the midshaft of the left humerus. 2. New oblique fracture traversing the distal shaft of the left humerus with posterior displacement and anterior angulation.   Electronically Signed   By: Rise Mu M.D.   On: 03/31/2013 04:53   Dg Humerus Left  03/31/2013   CLINICAL DATA:  Left humerus deformity. Patient passed out and fell. Arm pain.  EXAM: LEFT HUMERUS - 2+ VIEW  COMPARISON:  None.  FINDINGS: There is a transverse fracture through the midshaft of the left humerus with medial angulation of the distal fracture fragments. Suggestion of a lucent structure over the distal humeral shaft but I believe this is associated with the nectar shaft consistent with a superimposed foreign body. Repeat radiograph should be obtained when the patient is able to exclude a focal bone lesion. No definite evidence that this represents a pathologic fracture. No dislocation of the shoulder.  IMPRESSION: Acute transverse fracture of the midshaft left humerus with medial angulation of the distal fracture fragments.   Electronically Signed   By: Burman Nieves M.D.   On: 03/31/2013 03:41    EKG Interpretation    Date/Time:  Wednesday March 31 2013 03:25:15 EST Ventricular Rate:  76 PR Interval:  138 QRS Duration: 90 QT Interval:  388 QTC Calculation: 436 R Axis:   13 Text Interpretation:  Normal sinus rhythm Nonspecific ST and T wave abnormality Left  ventricular hypertrophy REPOLARIZATION ABNORMALITY Abnormal ECG No previous ECGs available Confirmed by Chasyn Cinque  MD, Akeiba Axelson ([id number]) on 03/31/2013 3:41:46 AM            MDM   1. Syncope   2. Heme positive stool   3. DM type 2 (diabetes mellitus, type 2)   4. HTN (hypertension)   5. Fracture of humerus,  left, closed, initial encounter   6. Anemia    The patient likely has had a syncopal episode, there was no seizure activity but there was some incontinence of bowel. The patient reports that she was unable to get up off of the floor feeling weak. With the use of aspirin I suspect that she may have a bleeding ulcer, she will need Pepcid, Protonix, labs to evaluate for hemodynamics, blood counts, electrolytes, cardiac function. EKG has been obtained and does show some signs of left ventricular hypertrophy and nonspecific T waves. Cardiac monitor, fluids, rectal exam.  I have reduced the fracture, immobilized the patient with a long arm splint and performed a repeat x-ray.  Hemoccult testing is positive at the bedside.  D/w Dr. Rito Ehrlich - will admit  The fracture was easily reduced with minimal traction at the elbow. I suspect that the fracture that is seen on the repeat x-rays was present on the initial x-ray however distraction to allow reduction of the proximal fracture uncovered the distal fracture.  Vida Roller, MD 03/31/13 [phone]
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
## 2013-03-31 NOTE — Evaluation (Signed)
Physical Therapy Evaluation Patient Details Name: Holly May MRN: [id number] DOB: 1950-09-30 Today's Date: 03/31/2013 Time: 4098-1191 PT Time Calculation (min): 29 min  PT Assessment / Plan / Recommendation History of Present Illness  Pt is admitted due to a syncopal episode at home.  She fell on the floor and had a complex fx to her left humerus.  Clinical Impression  Pt was in bed, LUE in a posterior splint and in a sling.  She had some difficulty sitting from supine (not dizzy but just "not feeling right").  She had instability with standing and ataxic gait even with a cane.  She had stable BP during tx.  At this point, she is a very high fall risk and I have asked staff to watch her carefully.  We will continue to work with her and try to establish stable gait.    PT Assessment  Patient needs continued PT services    Follow Up Recommendations  Other (comment) (unable to determine at this time)    Does the patient have the potential to tolerate intense rehabilitation      Barriers to Discharge        Equipment Recommendations   (unable to determine at this time)    Recommendations for Other Services     Frequency Min 5X/week    Precautions / Restrictions Precautions Precautions: Fall Restrictions Weight Bearing Restrictions: No   Pertinent Vitals/Pain       Mobility  Bed Mobility Bed Mobility: Supine to Sit;Sit to Supine Supine to Sit: 7: Independent;HOB elevated Sit to Supine: 7: Independent;HOB flat Transfers Transfers: Sit to Stand;Stand to Sit Sit to Stand: 4: Min guard;With upper extremity assist Stand to Sit: 4: Min guard;With upper extremity assist Details for Transfer Assistance: pt was very unsteady initially upon rising and needed to sit down...BP was taken and found to be 159/70.Marland KitchenMarland KitchenShe did not have any dizziness and was able to stand again and hold stance independently after a few minutes.   Ambulation/Gait Ambulation/Gait Assistance: 3: Mod  assist Ambulation Distance (Feet): 20 Feet Assistive device: Straight cane Gait Pattern: Ataxic Gait velocity: WNL General Gait Details: pt had no dizziness or syncope reported but had weaving gait pattern, even with a cane Stairs: No Wheelchair Mobility Wheelchair Mobility: No    Exercises     PT Diagnosis: Difficulty walking;Abnormality of gait  PT Problem List: Decreased activity tolerance;Decreased balance;Decreased mobility;Decreased knowledge of use of DME PT Treatment Interventions: Gait training     PT Goals(Current goals can be found in the care plan section) Acute Rehab PT Goals Patient Stated Goal: none stated PT Goal Formulation: With patient Time For Goal Achievement: 04/14/13 Potential to Achieve Goals: Good  Visit Information  Last PT Received On: 03/31/13 History of Present Illness: Pt is admitted due to a syncopal episode at home.  She fell on the floor and had a complex fx to her left humerus.       Prior Functioning  Home Living Family/patient expects to be discharged to:: Private residence Living Arrangements: Spouse/significant other;Children Available Help at Discharge: Family;Available 24 hours/day Type of Home: House Home Access: Ramped entrance Home Layout: One level Home Equipment: None Prior Function Level of Independence: Independent Communication Communication: No difficulties    Cognition  Cognition Arousal/Alertness: Lethargic Behavior During Therapy: WFL for tasks assessed/performed Overall Cognitive Status: Within Functional Limits for tasks assessed    Extremity/Trunk Assessment Lower Extremity Assessment Lower Extremity Assessment: Overall WFL for tasks assessed   Balance Balance Balance Assessed:  Yes Static Standing Balance Static Standing - Balance Support: No upper extremity supported Static Standing - Level of Assistance: 5: Stand by assistance Dynamic Standing Balance Dynamic Standing - Balance Support: Right upper  extremity supported Dynamic Standing - Level of Assistance: 3: Mod assist  End of Session PT - End of Session Equipment Utilized During Treatment: Gait belt Activity Tolerance: Patient limited by lethargy Patient left: in bed Nurse Communication: Mobility status  GP     Konrad Penta 03/31/2013, 3:46 PM

## 2013-03-31 NOTE — Progress Notes (Signed)
UR completed. Patient changed to inpatient r/t requiring IVF @ 100cc/hr and IV antibiotics.  
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
## 2013-03-31 NOTE — Progress Notes (Signed)
[facility] PROGRESS NOTE  GWENDA HEINER ZOX:[id number] DOB: [birth year] DOA: 03/31/2013 PCP: No PCP Per Patient   Brief narrative:  refer to admission H&P from this am for details.  Assessment/Plan:  syncope:  -orthostasis vs vasovagal vs seizure. Patient reports LOC for few minutes and bowel incontinence during one episode. -monitor on  telemetry.  -follow 2D Echocardiogram and EEG  - Check orthostatics. Continue  IVF.  - CT head negative for acute abnormality.  PT and OT eval.    left humeral fracture:  . Posterior splint has been placed by the ED physician, which will be continued.  -appreciate orthopedics consult . Plan on further management following syncope w/up. -feels internal fixation might be adequate but may need transfer to [facility] or [facility].   UTI  urine cx ordered. She does report dysuria. Added empiric IV rocephin    heme positive stools with weight loss and  mild anemia -follow  anemia panel.  - consult gastroenterology. Has not ahd any colonoscopy in past. Hold NSAIDs. Continue PPI.  Hypokalemia: Replenish    diabetes mellitus, type II Check HbA1c. On sliding scale coverage. Hold metformin  hypertension:  Check orthostatics. Hold antihypertensive medications for now.    headaches:  Resolved. Head CT negative   tobacco abuse:  Counseled on cessation. Nicotine patch   Diet: diabetic  DVT prophylaxis: SCDs      Code Status: full code Family Communication: none at bedside Disposition Plan: pending workup    Consultants:  Ortho  GI  Procedures:  none  Antibiotics:  IV rocephin ( 12/31__)  HPI/Subjective: Admission H&P from this am reviewed. patient denies any symptoms at home.   Objective: Filed Vitals:   03/31/13 0747  BP: 113/52  Pulse: 88  Temp: 98 F (36.7 C)  Resp: 20   No intake or output data in the 24 hours ending 03/31/13 0903 Filed Weights   03/31/13 0208  Weight: 64.864 kg (143 lb)     Exam:   General: Elderly female in no acute distress  HEENT: No pallor, no icterus oral mucosa  Chest: Clear to auscultation bilaterally, no added sounds  CVS: Normal S1-S2, no murmurs or gallop  Abdomen: Soft, nontender, nondistended, bowel sounds present  Extremities: Warm, no edema, posterior splint over the left arm  CNS: AAO x3    Data Reviewed: Basic Metabolic Panel:  Recent Labs Lab 03/31/13 0358  NA 132*  K 3.3*  CL 91*  CO2 27  GLUCOSE 193*  BUN 29*  CREATININE 0.75  CALCIUM 9.2   Liver Function Tests:  Recent Labs Lab 03/31/13 0358  AST 15  ALT 10  ALKPHOS 89  BILITOT 0.7  PROT 7.3  ALBUMIN 2.9*   No results found for this basename: LIPASE, AMYLASE,  in the last 168 hours No results found for this basename: AMMONIA,  in the last 168 hours CBC:  Recent Labs Lab 03/31/13 0358  WBC 18.2*  NEUTROABS 15.4*  HGB 10.8*  HCT 31.5*  MCV 87.0  PLT 187   Cardiac Enzymes:  Recent Labs Lab 03/31/13 0358  TROPONINI <0.30   BNP (last 3 results) No results found for this basename: PROBNP,  in the last 8760 hours CBG:  Recent Labs Lab 03/31/13 0750  GLUCAP 293*    No results found for this or any previous visit (from the past 240 hour(s)).   Studies: Ct Head Wo Contrast  03/31/2013   CLINICAL DATA:  Syncope and headaches.  [employer].  EXAM: CT HEAD  WITHOUT CONTRAST  TECHNIQUE: Contiguous axial images were obtained from the base of the skull through the vertex without intravenous contrast.  COMPARISON:  None.  FINDINGS: Mild diffuse cerebral atrophy. No ventricular dilatation. Old lacune or infarct in the deep white matter on the left. Patchy low-attenuation changes adjacent to the periventricular white matter consistent with small vessel ischemia. No mass effect or midline shift. No abnormal extra-axial fluid collections. Gray-white matter junctions are distinct. Basal cisterns are not effaced. No evidence of acute intracranial  hemorrhage. No depressed skull fractures. Vascular calcifications. Visualized paranasal sinuses and mastoid air cells are not opacified.  IMPRESSION: No acute intracranial abnormalities. Chronic atrophy and small vessel ischemic changes.   Electronically Signed   By: Burman Nieves M.D.   On: 03/31/2013 05:51   Dg Chest Port 1 View  03/31/2013   CLINICAL DATA:  Chest pain after a fall.  EXAM: PORTABLE CHEST - 1 VIEW  COMPARISON:  None.  FINDINGS: The heart size and mediastinal contours are within normal limits. Both lungs are clear. The visualized skeletal structures are unremarkable. Calcification of the aorta.  IMPRESSION: No active disease.   Electronically Signed   By: Burman Nieves M.D.   On: 03/31/2013 04:12   Dg Humerus Left  03/31/2013   CLINICAL DATA:  Post reduction  EXAM: LEFT HUMERUS - 2+ VIEW  COMPARISON:  Prior radiograph performed earlier on the same day  FINDINGS: Casting material is now seen overlying the left humerus. Previously identified acute transverse fracture through the midshaft of the left humerus with medial angulation again seen. Overall medial angulation at this fracture site is improved. A 2nd oblique displaced fracture is now seen through the distal shaft of the left humerus. There is posterior displacement and anterior a medial angulation at this fracture. The midshaft of the humerus is now free floating. The elbow and shoulder remain intact.  IMPRESSION: 1. Improved medial angulation at the previous identified transverse fracture through the midshaft of the left humerus. 2. New oblique fracture traversing the distal shaft of the left humerus with posterior displacement and anterior angulation.   Electronically Signed   By: Rise Mu M.D.   On: 03/31/2013 04:53   Dg Humerus Left  03/31/2013   CLINICAL DATA:  Left humerus deformity. Patient passed out and fell. Arm pain.  EXAM: LEFT HUMERUS - 2+ VIEW  COMPARISON:  None.  FINDINGS: There is a transverse  fracture through the midshaft of the left humerus with medial angulation of the distal fracture fragments. Suggestion of a lucent structure over the distal humeral shaft but I believe this is associated with the nectar shaft consistent with a superimposed foreign body. Repeat radiograph should be obtained when the patient is able to exclude a focal bone lesion. No definite evidence that this represents a pathologic fracture. No dislocation of the shoulder.  IMPRESSION: Acute transverse fracture of the midshaft left humerus with medial angulation of the distal fracture fragments.   Electronically Signed   By: Burman Nieves M.D.   On: 03/31/2013 03:41    Scheduled Meds: . cefTRIAXone (ROCEPHIN)  IV  1 g Intravenous Q24H  . citalopram  40 mg Oral Daily  . docusate sodium  100 mg Oral BID  . insulin aspart  0-15 Units Subcutaneous TID WC  . insulin aspart  0-5 Units Subcutaneous QHS  . nicotine  14 mg Transdermal Daily  . pantoprazole (PROTONIX) IV  40 mg Intravenous Q12H  . simvastatin  20 mg Oral q1800  .  sodium chloride  3 mL Intravenous Q12H   Continuous Infusions: . sodium chloride        Time spent: 25 minutes    Adamari Frede  [facility] Pager [phone] If 7PM-7AM, please contact night-coverage at [email or web address], password [facility] 03/31/2013, 9:03 AM  LOS: 0 days

## 2013-03-31 NOTE — Consult Note (Signed)
REVIEWED. OPV IN 4 WEEKS TO DISCUSS BENEFITS V. RISKS OF TCS/POSSIBLE EGD FOR ANEMIA.

## 2013-03-31 NOTE — ED Notes (Signed)
Gave patient ice water as requested and approved by MD. 

## 2013-04-01 DIAGNOSIS — D649 Anemia, unspecified: Secondary | ICD-10-CM

## 2013-04-01 DIAGNOSIS — S42309A Unspecified fracture of shaft of humerus, unspecified arm, initial encounter for closed fracture: Secondary | ICD-10-CM

## 2013-04-01 LAB — CBC
HCT: 30.2 % — ABNORMAL LOW (ref 36.0–46.0)
Hemoglobin: 10.2 g/dL — ABNORMAL LOW (ref 12.0–15.0)
MCH: 29.5 pg (ref 26.0–34.0)
MCHC: 33.8 g/dL (ref 30.0–36.0)
MCV: 87.3 fL (ref 78.0–100.0)
Platelets: 261 10*3/uL (ref 150–400)
RBC: 3.46 MIL/uL — ABNORMAL LOW (ref 3.87–5.11)
RDW: 15.5 % (ref 11.5–15.5)
WBC: 15.4 10*3/uL — ABNORMAL HIGH (ref 4.0–10.5)

## 2013-04-01 LAB — COMPREHENSIVE METABOLIC PANEL
ALT: 11 U/L (ref 0–35)
AST: 17 U/L (ref 0–37)
Albumin: 2.6 g/dL — ABNORMAL LOW (ref 3.5–5.2)
Alkaline Phosphatase: 95 U/L (ref 39–117)
BUN: 10 mg/dL (ref 6–23)
CO2: 24 mEq/L (ref 19–32)
Calcium: 8.8 mg/dL (ref 8.4–10.5)
Chloride: 96 mEq/L (ref 96–112)
Creatinine, Ser: 0.54 mg/dL (ref 0.50–1.10)
GFR calc Af Amer: >90 mL/min (ref >90)
GFR calc non Af Amer: >90 mL/min (ref >90)
Glucose, Bld: 153 mg/dL — ABNORMAL HIGH (ref 70–99)
Potassium: 3.6 mEq/L — ABNORMAL LOW (ref 3.7–5.3)
Sodium: 134 mEq/L — ABNORMAL LOW (ref 137–147)
Total Bilirubin: 0.5 mg/dL (ref 0.3–1.2)
Total Protein: 7.3 g/dL (ref 6.0–8.3)

## 2013-04-01 LAB — GLUCOSE, CAPILLARY
Glucose-Capillary: 175 mg/dL — ABNORMAL HIGH (ref 70–99)
Glucose-Capillary: 246 mg/dL — ABNORMAL HIGH (ref 70–99)
Glucose-Capillary: 156 mg/dL — ABNORMAL HIGH (ref 70–99)
Glucose-Capillary: 128 mg/dL — ABNORMAL HIGH (ref 70–99)
Glucose-Capillary: 89 mg/dL (ref 70–99)

## 2013-04-01 MED ORDER — SODIUM CHLORIDE 0.9 % IV SOLN
250.0000 mL | INTRAVENOUS | Status: DC | PRN
  Administered 2013-04-01: 250 mL via INTRAVENOUS

## 2013-04-01 MED ORDER — SODIUM CHLORIDE 0.9 % IJ SOLN: 3.0000 mL | INTRAMUSCULAR | Status: DC | PRN

## 2013-04-01 NOTE — Progress Notes (Signed)
[facility] PROGRESS NOTE  SHARONANN MALBROUGH PJA:[id number] DOB: 1951/01/06 DOA: 03/31/2013 PCP: No PCP Per Patient  Assessment/Plan: 1. Syncope with bowel incontinence. No signs or symptoms otherwise to suggest seizure. Echocardiogram and EEG pending. Check orthostatics. CT head unremarkable. TSH normal. No focal deficits. 2. Left humeral fracture s/p fall. Orthopedics suggested stabilization with internal fixation but wanted cardiology opinion before final recommendations can be made. 3. Heme positive stools with h/o daily BC powder use. Stop NSAIDs. Continue PPI. Outpatient GI evaluation. 4. Mild anemia, stable. Suspect secondary to NSAID use. 5. Leukocytosis: Improved. May be secondary to fracture. No signs or symptoms of infection. 6. History of weight loss and early satiety. GI followup as an outpatient. 7. DM type 2. Capillary blood sugars stable. Hemoglobin A1c 5.9. Resume metformin on discharge. Continue sliding scale insulin. 8. N/v. Etiology unclear. Appears resolved. 9. Chronic headaches with daily BC powder use 10. Tobacco dependence. Recommend cessation. 11. Gait instability improving per PT.   Replace potassium  Check orthostatics   Cardiology consult per orthopedics request  Orthopedics opinion 1/2   Followup with GI as an outpatient. Continue PPI daily. No NSAIDs.  Pending studies:   Urine culture  Code Status: full code DVT prophylaxis: SCDs Family Communication: none present Disposition Plan: home when improved  Murray Hodgkins, MD  [facility]  Pager [phone] If 7PM-7AM, please contact night-coverage at [email or web address], password [facility] 04/01/2013, 1:42 PM  LOS: 1 day   Summary: 63 year old woman presented after syncopal episode with fall resulting in fracture of left humerus. Admitted for further evaluation. Her history suggest orthostasis.  Consultants:  Gastroenterology  Orthopedics  Cardiology  Occupational  therapy  Procedures:  EEG:  2-D echocardiogram:  HPI/Subjective: Complains of some pain from her shoulder from pressure from splint. No pain in hand, normal sensation in hand.  Objective: Filed Vitals:   03/31/13 0500 03/31/13 0747 03/31/13 2331 04/01/13 0611  BP: 118/58 113/52 111/65 155/73  Pulse: 79 88 81 88  Temp:  98 F (36.7 C) 98.2 F (36.8 C) 98.3 F (36.8 C)  TempSrc:  Oral Oral Oral  Resp: 15 20 16 18   Height:      Weight:      SpO2: 94% 98% 96% 94%    Intake/Output Summary (Last 24 hours) at 04/01/13 1342 Last data filed at 04/01/13 1250  Gross per 24 hour  Intake    240 ml  Output      0 ml  Net    240 ml     Filed Weights   03/31/13 0208  Weight: 64.864 kg (143 lb)    Exam:   Afebrile, vital signs stable  General: Appears calm and comfortable. Speech fluent and clear.  Cardiovascular: Regular rate and rhythm. No murmur, rub or gallop. No lower extremity edema.  Telemetry: Sinus rhythm, one brief run SVT  Respiratory: Clear to auscultation bilaterally. No wheezes, rales rhonchi. Normal respiratory effort.  Left arm: Shoulder appears unremarkable. Strong radial pulse. Excellent capillary refill in the fingers. Hand is warm and dry. Sensation grossly intact.   Psychiatric: Grossly normal mood and affect. Speech fluent and appropriate.  Data Reviewed:  Capillary blood sugars stable  Potassium 3.6  Hemoglobin stable, 10.2.  WBC improved, 15.4.  Hemoglobin A1c 5.9.  TSH 0.484  CT head negative  Chest x-ray no acute disease on admission   EKG normal sinus rhythm, no acute changes on admission  Scheduled Meds: . cefTRIAXone (ROCEPHIN)  IV  1 g Intravenous Q24H  . citalopram  40 mg Oral Daily  . docusate sodium  100 mg Oral BID  . insulin aspart  0-15 Units Subcutaneous TID WC  . insulin aspart  0-5 Units Subcutaneous QHS  . nicotine  14 mg Transdermal Daily  . pantoprazole  40 mg Oral QAC supper  . potassium chloride  40 mEq  Oral BID  . simvastatin  20 mg Oral q1800  . sodium chloride  3 mL Intravenous Q12H   Continuous Infusions:   Principal Problem:   Syncope Active Problems:   Fracture of left humerus   DM type 2 (diabetes mellitus, type 2)   HTN (hypertension)   Heme positive stool   Normocytic anemia   Tobacco abuse   Time spent 20 minutes

## 2013-04-01 NOTE — Progress Notes (Signed)
Physical Therapy Treatment Patient Details Name: Holly May MRN: [id number] DOB: May 05, 1950 Today's Date: 04/01/2013 Time: 7793-9030 PT Time Calculation (min): 20 min  PT Assessment / Plan / Recommendation  PT Comments   Pt is pleasant and cooperative. Pt is able to ambulate 200 feet with CGA and without AD. Weaving noted with gait but no LOB. Pt completes therex well with minimal difficulty after initial cueing. Pt appears to be progressing well overall.  Follow Up Recommendations   (unable to determine at this time)     Equipment Recommendations   (unable to determine at this time)    Plan Current plan remains appropriate    Pertinent Vitals/Pain No pain    Mobility  Transfers Transfers: Sit to Stand;Stand to Sit Sit to Stand: 4: Min guard;With upper extremity assist Stand to Sit: 4: Min guard;With upper extremity assist Ambulation/Gait Ambulation Distance (Feet): 200 Feet Assistive device: None Ambulation/Gait Assistance Details: Gait without AD is somewhat unsteady but pt had no LOB.  Gait Pattern: Ataxic General Gait Details: Gait without AD is somewhat unsteady but pt had no LOB. Pt reports no dizziness or sycope but continues to weave from time to time.    Exercises General Exercises - Lower Extremity Ankle Circles/Pumps: Both;10 reps Quad Sets: Both;10 reps Hip ABduction/ADduction: Both;10 reps Straight Leg Raises: Both;10 reps   Visit Information  Last PT Received On: 04/01/13    Subjective Data  Pt complains of discomfort from arm brace. (Padded area with ABD pad to decrease discomfort.)  End of Session PT - End of Session Equipment Utilized During Treatment: Gait belt Activity Tolerance: Patient limited by lethargy Patient left: in bed;with bed alarm set Nurse Communication: Mobility status   Rachelle Hora, PTA  04/01/2013, 10:49 AM

## 2013-04-02 ENCOUNTER — Telehealth: Payer: Self-pay | Admitting: Gastroenterology

## 2013-04-02 ENCOUNTER — Encounter (HOSPITAL_COMMUNITY): Payer: Self-pay | Admitting: Cardiology

## 2013-04-02 DIAGNOSIS — R195 Other fecal abnormalities: Secondary | ICD-10-CM

## 2013-04-02 DIAGNOSIS — R55 Syncope and collapse: Principal | ICD-10-CM

## 2013-04-02 DIAGNOSIS — I319 Disease of pericardium, unspecified: Secondary | ICD-10-CM

## 2013-04-02 DIAGNOSIS — E119 Type 2 diabetes mellitus without complications: Secondary | ICD-10-CM

## 2013-04-02 DIAGNOSIS — I10 Essential (primary) hypertension: Secondary | ICD-10-CM

## 2013-04-02 LAB — GLUCOSE, CAPILLARY
Glucose-Capillary: 160 mg/dL — ABNORMAL HIGH (ref 70–99)
Glucose-Capillary: 158 mg/dL — ABNORMAL HIGH (ref 70–99)
Glucose-Capillary: 160 mg/dL — ABNORMAL HIGH (ref 70–99)

## 2013-04-02 LAB — URINE CULTURE
Colony Count: NO GROWTH
Culture: NO GROWTH

## 2013-04-02 MED ORDER — ACETAMINOPHEN 325 MG PO TABS: 650.0000 mg | ORAL_TABLET | Freq: Four times a day (QID) | ORAL | Status: DC | PRN

## 2013-04-02 MED ORDER — PANTOPRAZOLE SODIUM 40 MG PO TBEC: 40.0000 mg | DELAYED_RELEASE_TABLET | Freq: Every day | ORAL | Status: DC

## 2013-04-02 NOTE — Telephone Encounter (Signed)
PT SEEN IN HOUSE DURING WORKUP FOR SYNCOPE. HAS FRACTURED HUMERUS. OPV IN 4 WEEKS TO DISCUSS BENEFITS V. RISKS OF TCS/POSSIBLE EGD FOR ANEMIA.

## 2013-04-02 NOTE — Progress Notes (Signed)
[facility] PROGRESS NOTE  HALEEMAH BUCKALEW CBJ:[id number] DOB: 11-02-1950 DOA: 03/31/2013 PCP: No PCP Per Patient [facility]  Assessment/Plan: 1. Syncope with bowel incontinence. No recurrence. No signs or symptoms otherwise to suggest seizure. Echocardiogram and EEG pending. Orthostatics unremarkable. CT head unremarkable. TSH normal. No focal deficits. Telemetry unremarkable. Will followup as an outpatient with cardiology after surgery for further evaluation. 2. Left humeral fracture s/p fall. Discussed with Dr. Aline Brochure. He recommends discharge, he will followup in the outpatient setting to arrange definitive management in [place]. 3. Heme positive stools with h/o daily BC powder use and Mobic. Stop NSAIDs. Continue PPI. Outpatient GI evaluation. 4. Mild anemia, stable. Suspect secondary to NSAID use. Followup with GI as an outpatient. 5. Leukocytosis: Improved. Probably secondary to fracture. No signs or symptoms of infection. 6. History of weight loss and early satiety. GI followup as an outpatient. 7. DM type 2. Capillary blood sugars stable. Hemoglobin A1c 5.9. Resume metformin on discharge.  8. Chronic headaches with daily BC powder use. Recommend strict abstinence. 9. Tobacco dependence. Recommend cessation. 10. HTN stable without Rx   Followup with GI as an outpatient in 4 weeks with Dr. Oneida Alar to discuss EGD/colonoscopy. Continue PPI daily. No NSAIDs.  Followup with Dr. Aline Brochure 1/4 or 1/5. He will arrange definitive management in [place].  Follow-up with cardiology 2-3 weeks after surgery as an outpatient to consider longer-term outpatient monitor for unexplained syncope.  Per cardiology "If LVEF is normal and there are no significant abnormalities, anticipate that she should be able to proceed with left humerus surgery at an acceptable perioperative cardiac risk."  Followup hypertension. On 4 different medications, none at maximum dose. Well-controlled  without any medications here. Beta blocker and diuretic on hold on discharge with outpatient followup.  Pending studies:   Urine culture  Code Status: full code DVT prophylaxis: SCDs Family Communication: none present Disposition Plan: home when improved  Murray Hodgkins, MD  [facility]  Pager [phone] If 7PM-7AM, please contact night-coverage at [email or web address], password [facility] 04/02/2013, 2:22 PM  LOS: 2 days   Summary: 63 year old woman presented after syncopal episode with fall resulting in fracture of left humerus. Admitted for further evaluation. Her history suggest orthostasis.  Consultants:  Gastroenterology  Orthopedics  Cardiology  Occupational therapy  PT: no follow-up  Procedures:  EEG: Pending  2-D echocardiogram:  Antibiotics: Ceftriaxone 12/31 >> 1/2 for possible UTI  HPI/Subjective: She feels fine. No dizziness. No new issues. Still some pain in her shoulder where the splint is digging into her arm. No pain in the hand. Normal feeling in the hand and movement.  Objective: Filed Vitals:   04/01/13 1807 04/01/13 1808 04/01/13 2145 04/02/13 0514  BP: 134/75 126/70 133/84 153/79  Pulse: 93 85 84 94  Temp:   98.8 F (37.1 C) 98.1 F (36.7 C)  TempSrc:   Oral Oral  Resp:   20 20  Height:      Weight:      SpO2: 95% 95% 95% 92%    Intake/Output Summary (Last 24 hours) at 04/02/13 1422 Last data filed at 04/02/13 1010  Gross per 24 hour  Intake    360 ml  Output    350 ml  Net     10 ml     Filed Weights   03/31/13 0208  Weight: 64.864 kg (143 lb)    Exam:   Afebrile, vital signs stable  General: Appears calm and comfortable. Speech fluent and clear.  Cardiovascular: Regular rate and  rhythm. No murmur, rub or gallop.  Respiratory: Clear to auscultation bilaterally. No wheezes, rales or rhonchi. Normal respiratory effort.  Bilateral lower extremities appear unremarkable, feet warm, dry, skin appears  unremarkable.  Neurologic grossly normal  Data Reviewed:  Capillary blood sugars stable  Scheduled Meds: . cefTRIAXone (ROCEPHIN)  IV  1 g Intravenous Q24H  . citalopram  40 mg Oral Daily  . docusate sodium  100 mg Oral BID  . insulin aspart  0-15 Units Subcutaneous TID WC  . insulin aspart  0-5 Units Subcutaneous QHS  . nicotine  14 mg Transdermal Daily  . pantoprazole  40 mg Oral QAC supper  . simvastatin  20 mg Oral q1800  . sodium chloride  3 mL Intravenous Q12H   Continuous Infusions:   Principal Problem:   Syncope Active Problems:   Fracture of left humerus   DM type 2 (diabetes mellitus, type 2)   HTN (hypertension)   Heme positive stool   Normocytic anemia   Tobacco abuse

## 2013-04-02 NOTE — Discharge Summary (Signed)
Physician Discharge Summary  LYNNETTA TOM WUJ:[id number] DOB: Apr 13, 1950 DOA: 03/31/2013  PCP: No PCP Per Patient [facility] GI: Dr. Oneida Alar Orthopedics: Dr. Aline Brochure Cardiology: will establish with Dr. Domenic Polite   Admit date: 03/31/2013 Discharge date: 04/02/2013  Recommendations for Outpatient Follow-up:  1. Syncope, could consider outpatient event monitor, can be pursued as outpatient after surgery, see below 2. Followup left humeral fracture to see below 3. Followup mild anemia, heme positive stool in the context of chronic NSAID use 4. Followup early satiety and weight loss 5. Continue to encourage smoking cessation  Follow-up Information   Follow up with Arther Abbott, MD. Schedule an appointment as soon as possible for a visit on 04/05/2013.   Specialties:  Orthopedic Surgery, Radiology   Contact information:   [address], [place], [place] [address] [phone]       Follow up with Barney Drain, MD. Schedule an appointment as soon as possible for a visit in 4 weeks.   Specialty:  Gastroenterology   Contact information:   [address] [place] [address] [phone]       Follow up with Your doctor at [facility] In 1 week.      Follow up with Rozann Lesches, MD On 05/03/2013. (11:20 AM)    Specialty:  Cardiology   Contact information:   [place] [address] [phone]      Discharge Diagnoses:  1. Syncope 2. Left humeral fracture 3. Heme positive stools 4. Mild anemia normocytic 5. Leukocytosis 6. Diabetes mellitus type 2 7. Chronic headaches with daily BC powder use 8. Tobacco dependence 9. Hypertension  Discharge Condition: Improved Disposition: Home  Diet recommendation: Heart healthy diabetic  Filed Weights   03/31/13 0208  Weight: 64.864 kg (143 lb)    History of present illness:  63 year old woman presented after syncopal episode with  fall resulting in fracture of left humerus. Admitted for further evaluation. Her history suggest orthostasis.  Hospital Course:  Ms. Brun was for further evaluation of syncope, anemia and humeral fracture management. She was seen by cardiology. Telemetry unremarkable, cardiac enzymes negative. 2-D echocardiogram pending. Orthostatics were negative. Per cardiology patient may be discharged home with outpatient followup, consider outpatient monitor after surgery. In regard to anemia, she was guaiac positive but uses Goody powders and Mobic chronically. No evidence of bleeding here. GI recommended outpatient followup for consideration of EGD and colonoscopy. In regard to humeral fracture patient remains neurovascularly intact. Discussed with Dr. Aline Brochure, he recommends discharge home, he will see in the office next week and arrange definitive management in [place]. He will evaluate splint today prior to discharge.  1. Syncope with bowel incontinence. No recurrence. No signs or symptoms otherwise to suggest seizure. Echocardiogram and EEG pending. Orthostatics unremarkable. CT head unremarkable. TSH normal. No focal deficits. Telemetry unremarkable. Will followup as an outpatient with cardiology after surgery for further evaluation. Possibly secondary to antihypertensive meds (on 4 at home). 2. Left humeral fracture s/p fall. Discussed with Dr. Aline Brochure. He recommends discharge, he will followup in the outpatient setting to arrange definitive management in [place]. 3. Heme positive stools with h/o daily BC powder use and Mobic. Stop NSAIDs. Continue PPI. Outpatient GI evaluation. 4. Mild anemia, stable. Suspect secondary to NSAID use. Followup with GI as an outpatient. 5. Leukocytosis: Improved. Probably secondary to fracture. No signs or symptoms of infection. 6. History of weight loss and early satiety. GI followup as  an outpatient. 7. DM type 2. Capillary blood sugars stable. Hemoglobin A1c 5.9.  Resume metformin on discharge.  8. Chronic headaches with daily BC powder use. Recommend strict abstinence. 9. Tobacco dependence. Recommend cessation. 10. HTN stable without Rx Followup with GI as an outpatient in 4 weeks with Dr. Oneida Alar to discuss EGD/colonoscopy. Continue PPI daily. No NSAIDs.  Followup with Dr. Aline Brochure 1/4 or 1/5. He will arrange definitive management in [place].  Follow-up with cardiology 2-3 weeks after surgery as an outpatient to consider longer-term outpatient monitor for unexplained syncope. Per cardiology "If LVEF is normal and there are no significant abnormalities, anticipate that she should be able to proceed with left humerus surgery at an acceptable perioperative cardiac risk."  Followup hypertension. On 4 different medications, none at maximum dose. Well-controlled without any medications here. Beta blocker and diuretic on hold on discharge with outpatient followup.  Consultants:  Gastroenterology  Orthopedics  Cardiology  Occupational therapy  PT: no follow-up Procedures:  EEG: Pending  2-D echocardiogram: Antibiotics: Ceftriaxone 12/31 >> 1/2 for possible UTI   on exam today the left arm appears unremarkable in the splint. Hand is warm, dry, excellent perfusion the fingers, normal sensation.    Discharge Instructions  Discharge Orders   Future Orders Complete By Expires   Diet - low sodium heart healthy  As directed    Discharge instructions  As directed    Comments:     Call your physician or seek immediate medical attention for passing out, bleeding, left arm pain, left arm numbness, weakness or change in your left arm. Stop smoking! No NSAIDs (including BC powders, Goody powders, Mobic, aspirin, Aleve, Motrin). Take Protonix to protect your stomach. Call Dr. Ruthe Mannan office for any problems with your arm or splint.   Increase activity slowly  As directed        Medication List    STOP taking these medications       BC HEADACHE  POWDER PO     carvedilol 12.5 MG tablet  Commonly known as:  COREG     hydrochlorothiazide 12.5 MG tablet  Commonly known as:  HYDRODIURIL     meloxicam 7.5 MG tablet  Commonly known as:  MOBIC      TAKE these medications       acetaminophen 325 MG tablet  Commonly known as:  TYLENOL  Take 2 tablets (650 mg total) by mouth every 6 (six) hours as needed for mild pain (or Fever >/= 101).     albuterol 108 (90 BASE) MCG/ACT inhaler  Commonly known as:  PROVENTIL HFA;VENTOLIN HFA  Inhale 2 puffs into the lungs every 6 (six) hours as needed for wheezing.     amLODipine 10 MG tablet  Commonly known as:  NORVASC  Take 10 mg by mouth daily.     citalopram 40 MG tablet  Commonly known as:  CELEXA  Take 40 mg by mouth daily.     lisinopril 10 MG tablet  Commonly known as:  PRINIVIL,ZESTRIL  Take 1 tablet (10 mg total) by mouth daily.     lovastatin 40 MG tablet  Commonly known as:  MEVACOR  Take 40 mg by mouth at bedtime.     metFORMIN 500 MG tablet  Commonly known as:  GLUCOPHAGE  Take 1 tablet (500 mg total) by mouth 2 (two) times daily with a meal.     oxybutynin 5 MG tablet  Commonly known as:  DITROPAN  Take 5 mg by mouth 2 (two) times daily.  pantoprazole 40 MG tablet  Commonly known as:  PROTONIX  Take 1 tablet (40 mg total) by mouth daily before supper.     traMADol 50 MG tablet  Commonly known as:  ULTRAM  Take 50 mg by mouth every 6 (six) hours as needed for pain.       No Known Allergies The results of significant diagnostics from this hospitalization (including imaging, microbiology, ancillary and laboratory) are listed below for reference.    Significant Diagnostic Studies: Ct Head Wo Contrast  03/31/2013   CLINICAL DATA:  Syncope and headaches.  [employer].  EXAM: CT HEAD WITHOUT CONTRAST  TECHNIQUE: Contiguous axial images were obtained from the base of the skull through the vertex without intravenous contrast.  COMPARISON:  None.  FINDINGS:  Mild diffuse cerebral atrophy. No ventricular dilatation. Old lacune or infarct in the deep white matter on the left. Patchy low-attenuation changes adjacent to the periventricular white matter consistent with small vessel ischemia. No mass effect or midline shift. No abnormal extra-axial fluid collections. Gray-white matter junctions are distinct. Basal cisterns are not effaced. No evidence of acute intracranial hemorrhage. No depressed skull fractures. Vascular calcifications. Visualized paranasal sinuses and mastoid air cells are not opacified.  IMPRESSION: No acute intracranial abnormalities. Chronic atrophy and small vessel ischemic changes.   Electronically Signed   By: Lucienne Capers M.D.   On: 03/31/2013 05:51   Dg Chest Port 1 View  03/31/2013   CLINICAL DATA:  Chest pain after a fall.  EXAM: PORTABLE CHEST - 1 VIEW  COMPARISON:  None.  FINDINGS: The heart size and mediastinal contours are within normal limits. Both lungs are clear. The visualized skeletal structures are unremarkable. Calcification of the aorta.  IMPRESSION: No active disease.   Electronically Signed   By: Lucienne Capers M.D.   On: 03/31/2013 04:12   Dg Humerus Left  03/31/2013   CLINICAL DATA:  Post reduction  EXAM: LEFT HUMERUS - 2+ VIEW  COMPARISON:  Prior radiograph performed earlier on the same day  FINDINGS: Casting material is now seen overlying the left humerus. Previously identified acute transverse fracture through the midshaft of the left humerus with medial angulation again seen. Overall medial angulation at this fracture site is improved. A 2nd oblique displaced fracture is now seen through the distal shaft of the left humerus. There is posterior displacement and anterior a medial angulation at this fracture. The midshaft of the humerus is now free floating. The elbow and shoulder remain intact.  IMPRESSION: 1. Improved medial angulation at the previous identified transverse fracture through the midshaft of the left  humerus. 2. New oblique fracture traversing the distal shaft of the left humerus with posterior displacement and anterior angulation.   Electronically Signed   By: Jeannine Boga M.D.   On: 03/31/2013 04:53   Dg Humerus Left  03/31/2013   CLINICAL DATA:  Left humerus deformity. Patient passed out and fell. Arm pain.  EXAM: LEFT HUMERUS - 2+ VIEW  COMPARISON:  None.  FINDINGS: There is a transverse fracture through the midshaft of the left humerus with medial angulation of the distal fracture fragments. Suggestion of a lucent structure over the distal humeral shaft but I believe this is associated with the nectar shaft consistent with a superimposed foreign body. Repeat radiograph should be obtained when the patient is able to exclude a focal bone lesion. No definite evidence that this represents a pathologic fracture. No dislocation of the shoulder.  IMPRESSION: Acute transverse fracture of the midshaft  left humerus with medial angulation of the distal fracture fragments.   Electronically Signed   By: Lucienne Capers M.D.   On: 03/31/2013 03:41   Labs: Basic Metabolic Panel:  Recent Labs Lab 03/31/13 0358 04/01/13 0618  NA 132* 134*  K 3.3* 3.6*  CL 91* 96  CO2 27 24  GLUCOSE 193* 153*  BUN 29* 10  CREATININE 0.75 0.54  CALCIUM 9.2 8.8   Liver Function Tests:  Recent Labs Lab 03/31/13 0358 04/01/13 0618  AST 15 17  ALT 10 11  ALKPHOS 89 95  BILITOT 0.7 0.5  PROT 7.3 7.3  ALBUMIN 2.9* 2.6*   CBC:  Recent Labs Lab 03/31/13 0358 04/01/13 0618  WBC 18.2* 15.4*  NEUTROABS 15.4*  --   HGB 10.8* 10.2*  HCT 31.5* 30.2*  MCV 87.0 87.3  PLT 187 261   Cardiac Enzymes:  Recent Labs Lab 03/31/13 0358 03/31/13 0859 03/31/13 1541  TROPONINI <0.30 <0.30 <0.30   CBG:  Recent Labs Lab 04/01/13 1138 04/01/13 1631 04/01/13 2159 04/02/13 0740 04/02/13 1213  GLUCAP 128* 89 160* 158* 160*    Principal Problem:   Syncope Active Problems:   Fracture of left  humerus   DM type 2 (diabetes mellitus, type 2)   HTN (hypertension)   Heme positive stool   Normocytic anemia   Tobacco abuse   Time coordinating discharge: 35 minutes  Signed:  Murray Hodgkins, MD [facility] 04/02/2013, 3:45 PM

## 2013-04-02 NOTE — Progress Notes (Signed)
Physical Therapy Treatment Patient Details Name: Holly May MRN: [id number] DOB: 02-19-1951 Today's Date: 04/02/2013 Time: 9767-3419 PT Time Calculation (min): 16 min  PT Assessment / Plan / Recommendation  History of Present Illness Pt reports feeling much better...was sitting up in a recliner by the window and has been up in the room independently   PT Comments   Pt is back to functional baseline.  She does c/o discomfort from the long arm splint on her LUE and she states that she anticipates having surgery on it next week in [place].  She has no gait instability.  She was instructed in gait safety as she will have an intrinsic risk of fall due to fx of LUE.    Follow Up Recommendations  No PT follow up     Does the patient have the potential to tolerate intense rehabilitation     Barriers to Discharge        Equipment Recommendations  None recommended by PT    Recommendations for Other Services    Frequency     Progress towards PT Goals Progress towards PT goals: Goals met/education completed, patient discharged from PT  Plan Discharge plan needs to be updated    Precautions / Restrictions     Pertinent Vitals/Pain     Mobility  Transfers Transfers: Sit to Stand;Stand to Sit Sit to Stand: 7: Independent;With upper extremity assist Stand to Sit: 7: Independent;With upper extremity assist Details for Transfer Assistance: no instability Ambulation/Gait Ambulation/Gait Assistance: 6: Modified independent (Device/Increase time) Ambulation Distance (Feet): 175 Feet Assistive device: None Gait Pattern: Within Functional Limits Gait velocity: WNL General Gait Details: gait is stable today with no assistive device...no LOB Stairs: No (pt declines) Wheelchair Mobility Wheelchair Mobility: No    Exercises     PT Diagnosis:    PT Problem List:   PT Treatment Interventions:     PT Goals (current goals can now be found in the care plan section)    Visit  Information  Last PT Received On: 04/02/13 History of Present Illness: Pt reports feeling much better...was sitting up in a recliner by the window and has been up in the room independently    Subjective Data      Cognition       Balance  Balance Balance Assessed: Yes Dynamic Standing Balance Dynamic Standing - Balance Support: No upper extremity supported Dynamic Standing - Level of Assistance: 7: Independent  End of Session PT - End of Session Equipment Utilized During Treatment: Gait belt Activity Tolerance: Patient tolerated treatment well Patient left: in chair;with call bell/phone within reach   GP     Demetrios Isaacs L 04/02/2013, 3:20 PM

## 2013-04-02 NOTE — Consult Note (Signed)
Consulting cardiologist: Dr. Satira May  Clinical Summary [name] is a 63 y.o.female admitted to the hospital after her recent episodes of syncope. First occurred after getting up from bed and going into the kitchen to get something to drink, second time occurred a day later around midnight again when she got up to go to the kitchen to get some ice from the refrigerator. She does not recall recall passing out, unaware of any preceding chest pain, dizziness, or palpitations. He has chronic shortness of breath, no regular sense of palpitations. She has been experiencing weight loss, approximately 30-40 pounds over the last year, also has chronic headaches and uses analgesics.  Workup shows normal troponin I levels, iron deficiency anemia with fecal occult positive, no acute changes by head CT. ECG shows sinus rhythm with nonspecific ST-T changes. She is undergoing a GI workup for her anemia, likely to have an outpatient EGD and colonoscopy ultimately with Dr. Oneida May. She also has a left humerus fracture and has been seen by Dr. Aline May, may ultimately need surgery. She has had an EEG, results pending.   Telemetry has been unremarkable. She was not noted to be orthostatic by recent measurements.   No Known Allergies  Medications Scheduled Medications: . cefTRIAXone (ROCEPHIN)  IV  1 g Intravenous Q24H  . citalopram  40 mg Oral Daily  . docusate sodium  100 mg Oral BID  . insulin aspart  0-15 Units Subcutaneous TID WC  . insulin aspart  0-5 Units Subcutaneous QHS  . nicotine  14 mg Transdermal Daily  . pantoprazole  40 mg Oral QAC supper  . simvastatin  20 mg Oral q1800  . sodium chloride  3 mL Intravenous Q12H    PRN Medications: sodium chloride, acetaminophen, acetaminophen, alum & mag hydroxide-simeth, HYDROcodone-acetaminophen, HYDROmorphone (DILAUDID) injection, ondansetron (ZOFRAN) IV, ondansetron, sodium chloride   Past Medical History  Diagnosis Date  . Essential  hypertension, benign   . Type 2 diabetes mellitus   . Depression   . Arthritis     Past Surgical History  Procedure Laterality Date  . Cholecystectomy      Family History  Problem Relation Age of Onset  . Diabetes Sister   . Colon cancer Neg Hx     Social History Holly May reports that she has been smoking Cigarettes.  She has been smoking about 1.00 pack per day. She does not have any smokeless tobacco history on file. Holly May reports that she does not drink alcohol.  Review of Systems No regular exertional chest pain, chronically short of breath as outlined. No sense of palpitations. Stable appetite. Otherwise as outlined above.  Physical Examination Blood pressure 153/79, pulse 94, temperature 98.1 F (36.7 C), temperature source Oral, resp. rate 20, height 5\' 9"  (1.753 m), weight 143 lb (64.864 kg), SpO2 92.00%.  Intake/Output Summary (Last 24 hours) at 04/02/13 1359 Last data filed at 04/02/13 1010  Gross per 24 hour  Intake    360 ml  Output    350 ml  Net     10 ml   Chronically ill-appearing woman, no distress. HEENT: Conjunctiva and lids normal, oropharynx clear with poor dentition. Neck: Supple, no elevated JVP or carotid bruits, no thyromegaly. Lungs: Clear to auscultation, nonlabored breathing at rest. Cardiac: Regular rate and rhythm, no S3 or significant systolic murmur, no pericardial rub. Abdomen: Soft, nontender, bowel sounds present, no guarding or rebound. Extremities: No pitting edema, distal pulses 2+. Left arm in cast. Skin: Warm and  dry. Musculoskeletal: No kyphosis. Neuropsychiatric: Alert and oriented x3, affect grossly appropriate.   Lab Results  Basic Metabolic Panel:  Recent Labs Lab 03/31/13 0358 04/01/13 0618  NA 132* 134*  K 3.3* 3.6*  CL 91* 96  CO2 27 24  GLUCOSE 193* 153*  BUN 29* 10  CREATININE 0.75 0.54  CALCIUM 9.2 8.8    Liver Function Tests:  Recent Labs Lab 03/31/13 0358 04/01/13 0618  AST 15 17  ALT  10 11  ALKPHOS 89 95  BILITOT 0.7 0.5  PROT 7.3 7.3  ALBUMIN 2.9* 2.6*    CBC:  Recent Labs Lab 03/31/13 0358 04/01/13 0618  WBC 18.2* 15.4*  NEUTROABS 15.4*  --   HGB 10.8* 10.2*  HCT 31.5* 30.2*  MCV 87.0 87.3  PLT 187 261    Cardiac Enzymes:  Recent Labs Lab 03/31/13 0358 03/31/13 0859 03/31/13 1541  TROPONINI <0.30 <0.30 <0.30    Imaging PORTABLE CHEST - 1 VIEW  COMPARISON: None.  FINDINGS:  The heart size and mediastinal contours are within normal limits.  Both lungs are clear. The visualized skeletal structures are  unremarkable. Calcification of the aorta.  IMPRESSION:  No active disease.   Impression  1. Unexplained syncope as described above. No evidence of ACS by cardiac enzymes, no arrhythmias by telemetry, echocardiogram pending for assessment of LVEF. She has also had an EEG with results pending. Head CT negative for acute event. No recurrence under observation.  2. Left humerus fracture, may need surgical management per Dr. Aline May.  3. Iron deficiency anemia, workup planned by Dr. Oneida May with outpatient EGD and colonoscopy.  4. Hypertension history, not orthostatic.  5. Type 2 diabetes mellitus.   Recommendations  We will followup on the echocardiogram already ordered. If LVEF is normal and there are no significant abnormalities, anticipate that she should be able to proceed with left humerus surgery at an acceptable perioperative cardiac risk. As far as her unexplained syncope, the possibility of a longer-term outpatient monitor could be considered such as a 30 day event recorder.  Holly May, M.D., F.A.C.C.

## 2013-04-02 NOTE — Progress Notes (Signed)
*  PRELIMINARY RESULTS* Echocardiogram 2D Echocardiogram has been performed.  [place], [place] 04/02/2013, 9:11 AM

## 2013-04-05 ENCOUNTER — Telehealth: Payer: Self-pay | Admitting: Orthopedic Surgery

## 2013-04-05 NOTE — Telephone Encounter (Signed)
Received a call from Jamse [place] requesting an appointment for fracture arm.  From your consult note, it looks like you were waiting on a cardiology consult. Please advise if to go ahead and schedule here and when to schedule .  She is self pay and working on getting some money together

## 2013-04-05 NOTE — Telephone Encounter (Signed)
Appointment scheduled for 04/06/12 and is aware.

## 2013-04-05 NOTE — Telephone Encounter (Signed)
Yes give appointment

## 2013-04-06 ENCOUNTER — Encounter: Payer: Self-pay | Admitting: Gastroenterology

## 2013-04-06 ENCOUNTER — Ambulatory Visit (INDEPENDENT_AMBULATORY_CARE_PROVIDER_SITE_OTHER): Payer: Medicaid Other | Admitting: Orthopedic Surgery

## 2013-04-06 ENCOUNTER — Encounter: Payer: Self-pay | Admitting: Orthopedic Surgery

## 2013-04-06 VITALS — BP 110/66 | Ht 69.0 in | Wt 137.0 lb

## 2013-04-06 DIAGNOSIS — S42302D Unspecified fracture of shaft of humerus, left arm, subsequent encounter for fracture with routine healing: Secondary | ICD-10-CM

## 2013-04-06 DIAGNOSIS — S42209A Unspecified fracture of upper end of unspecified humerus, initial encounter for closed fracture: Secondary | ICD-10-CM | POA: Insufficient documentation

## 2013-04-06 DIAGNOSIS — S42309A Unspecified fracture of shaft of humerus, unspecified arm, initial encounter for closed fracture: Secondary | ICD-10-CM | POA: Insufficient documentation

## 2013-04-06 DIAGNOSIS — S42309D Unspecified fracture of shaft of humerus, unspecified arm, subsequent encounter for fracture with routine healing: Secondary | ICD-10-CM

## 2013-04-06 MED ORDER — HYDROCODONE-ACETAMINOPHEN 7.5-325 MG PO TABS: 1.0000 | ORAL_TABLET | ORAL | Status: DC | PRN

## 2013-04-06 NOTE — Patient Instructions (Addendum)
You have a very bad fracture of your left humerus. You will be referred to a specialist who can better treat this fracture. We're giving him pain medication to help with discomfort related to the fracture. Our office will make the arrangements for the referral.  Meds ordered this encounter  Medications  . HYDROcodone-acetaminophen (NORCO) 7.5-325 MG per tablet    Sig: Take 1 tablet by mouth every 4 (four) hours as needed for moderate pain.    Dispense:  90 tablet    Refill:  0

## 2013-04-06 NOTE — Addendum Note (Signed)
Addended by: Arther Abbott E on: 04/06/2013 12:07 PM   Modules accepted: Level of Service
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
## 2013-04-06 NOTE — Telephone Encounter (Signed)
Pt is aware of OV on 2/4 at 10 with SF and appt card was mailed

## 2013-04-07 NOTE — Procedures (Signed)
[place] A. Merlene Laughter, MD     [email or web address]         [name], [name]                 ACCOUNT NO.:  [id number]  MEDICAL RECORD NO.:  [id number]  LOCATION:  EE                           FACILITY:  [facility]  PHYSICIAN:  Vernisha Bacote A. Merlene Laughter, M.D. DATE OF BIRTH:  1950/04/11  DATE OF PROCEDURE: DATE OF DISCHARGE:  03/31/2013                             EEG INTERPRETATION   HISTORY:  This is a 63 year old lady who blacked out, seizures are consideration and hence an EEG is obtained.  MEDICATIONS:  Maalox, Tylenol, Vicodin, NicoDerm, Zofran, potassium, Colace, Rocephin, Protonix, Zocor.  ANALYSIS:  A 16-channel recording using standard 10/20 measurement is carried out for 21 minutes.  There is a well-formed posterior dominant rhythm of 10 hertz, which attenuates with eye opening.  There is beta activity observed in the frontal areas.  Awake and drowsy activities are recorded.  Photic stimulation and hyperventilation were not carried out. There is no focal or lateralized slowing.  There is no epileptiform activity observed.  IMPRESSION:  Normal recording of awake and drowsy states.     Holly May A. Merlene Laughter, M.D.     KAD/MEDQ  D:  04/07/2013  T:  04/07/2013  Job:  [id number]

## 2013-04-09 NOTE — Progress Notes (Signed)
EEG read as normal per neurology. Echo with normal LVEF, normal wall motion. Grade 1 diastolic dysfunction. See cardiology recommendations from Dr. Domenic Polite 1/2.  Holly Hodgkins, MD [facility]

## 2013-05-02 ENCOUNTER — Encounter: Payer: Self-pay | Admitting: Cardiology

## 2013-05-02 NOTE — Progress Notes (Signed)
No show  This encounter was created in error - please disregard.

## 2013-05-03 ENCOUNTER — Encounter: Payer: Self-pay | Admitting: Cardiology

## 2013-05-03 ENCOUNTER — Encounter: Payer: Self-pay | Admitting: *Deleted

## 2013-05-05 ENCOUNTER — Ambulatory Visit: Payer: Self-pay | Admitting: Gastroenterology

## 2013-05-05 ENCOUNTER — Telehealth: Payer: Self-pay | Admitting: Gastroenterology

## 2013-05-05 NOTE — Telephone Encounter (Signed)
CONTACT PT TO [facility] 

## 2013-05-05 NOTE — Telephone Encounter (Signed)
Pt was a no show

## 2013-05-06 NOTE — Progress Notes (Signed)
HPI: Holly May is a 63 year old female patient of Dr. Domenic Polite where following for ongoing assessment and management of syncope. We saw the patient during hospitalization on consultation after the patient had a syncopal episode sustaining a left humeral fracture. Patient's other history includes hypertension, diabetes, and tobacco dependence. She was found to have heme positive stools and was found to have normocytic anemia. This was believed to be related to gait patterns and Mobic chronically. There was no evidence of GI bleeding. She was seen by Dr. Aline Brochure, orthopedics, she was to followup for definitive management in [place]. She is here for continued evaluation and possibility of need for cardiac monitor.  During hospitalization, the patient's carvedilol was discontinued, along with HCTZ, and meloxicam. She was continued on amlodipine 10 mg, lisinopril 10 mg. On review of EKG and labs he was no evidence of ACS. There was no evidence of arrhythmias during hospitalization. Echocardiogram demonstrated normal LV systolic function with grade 1 diastolic dysfunction.    She comes today with no further complaints of syncope. She is confused about medications she is to take.  No Known Allergies  Current Outpatient Prescriptions  Medication Sig Dispense Refill  . acetaminophen (TYLENOL) 325 MG tablet Take 2 tablets (650 mg total) by mouth every 6 (six) hours as needed for mild pain (or Fever >/= 101).      Marland Kitchen albuterol (PROVENTIL HFA;VENTOLIN HFA) 108 (90 BASE) MCG/ACT inhaler Inhale 2 puffs into the lungs every 6 (six) hours as needed for wheezing.      Marland Kitchen amLODipine (NORVASC) 10 MG tablet Take 10 mg by mouth daily.      . carvedilol (COREG) 12.5 MG tablet Take 12.5 mg by mouth 2 (two) times daily with a meal.      . citalopram (CELEXA) 40 MG tablet Take 40 mg by mouth daily.      . hydrochlorothiazide (HYDRODIURIL) 25 MG tablet Take 25 mg by mouth daily.      Marland Kitchen HYDROcodone-acetaminophen  (NORCO) 7.5-325 MG per tablet Take 1 tablet by mouth every 4 (four) hours as needed for moderate pain.  90 tablet  0  . lisinopril (PRINIVIL,ZESTRIL) 10 MG tablet Take 1 tablet (10 mg total) by mouth daily.  30 tablet  1  . lovastatin (MEVACOR) 40 MG tablet Take 40 mg by mouth at bedtime.      . meloxicam (MOBIC) 15 MG tablet Take 1/2 tablet daily      . metFORMIN (GLUCOPHAGE) 500 MG tablet Take 1 tablet (500 mg total) by mouth 2 (two) times daily with a meal.  60 tablet  1  . oxybutynin (DITROPAN) 5 MG tablet Take 5 mg by mouth 2 (two) times daily.       . pantoprazole (PROTONIX) 40 MG tablet Take 1 tablet (40 mg total) by mouth daily before supper.  30 tablet  0  . traMADol (ULTRAM) 50 MG tablet Take 50 mg by mouth every 6 (six) hours as needed for pain.       No current facility-administered medications for this visit.    Past Medical History  Diagnosis Date  . Essential hypertension, benign   . Type 2 diabetes mellitus   . Depression   . Arthritis     Past Surgical History  Procedure Laterality Date  . Cholecystectomy      ROS: Review of systems complete and found to be negative unless listed above  PHYSICAL EXAM BP 157/76  Pulse 95  Ht 5\' 7"  (1.702 m)  Wt  142 lb (64.411 kg)  BMI 22.24 kg/m2  General: Well developed, well nourished, in no acute distress Head: Eyes PERRLA, No xanthomas.   Normal cephalic and atramatic  Lungs: Clear bilaterally to auscultation and percussion. Heart: HRRR S1 S2, without MRG.  Pulses are 2+ & equal.            No carotid bruit. No JVD.  No abdominal bruits. No femoral bruits. Abdomen: Bowel sounds are positive, abdomen soft and non-tender without masses or                  Hernia's noted. Msk:  Back normal, normal gait. Normal strength and tone for age. Extremities: No clubbing, cyanosis or edema.  DP +1. Wearing brace to left arm, with sling. Neuro: Alert and oriented X 3. Psych:  Good affect, responds appropriately    ASSESSMENT AND  PLAN

## 2013-05-07 ENCOUNTER — Encounter: Payer: Self-pay | Admitting: Adult Health

## 2013-05-07 ENCOUNTER — Encounter (INDEPENDENT_AMBULATORY_CARE_PROVIDER_SITE_OTHER): Payer: Self-pay

## 2013-05-07 ENCOUNTER — Ambulatory Visit (INDEPENDENT_AMBULATORY_CARE_PROVIDER_SITE_OTHER): Payer: Medicaid Other | Admitting: Adult Health

## 2013-05-07 VITALS — BP 157/76 | HR 95 | Ht 67.0 in | Wt 142.0 lb

## 2013-05-07 DIAGNOSIS — F17200 Nicotine dependence, unspecified, uncomplicated: Secondary | ICD-10-CM

## 2013-05-07 DIAGNOSIS — R55 Syncope and collapse: Secondary | ICD-10-CM

## 2013-05-07 DIAGNOSIS — Z720 Tobacco use: Secondary | ICD-10-CM

## 2013-05-07 MED ORDER — GUAIFENESIN ER 600 MG PO TB12: 1200.0000 mg | ORAL_TABLET | Freq: Two times a day (BID) | ORAL | Status: DC

## 2013-05-07 NOTE — Assessment & Plan Note (Signed)
Unfortunately continues to smoke. She reports that she  has some thick secretions when she coughs. She is taking benadryl. I have asked her to stop the antihistamine and start on Muccinex to assist with decongestion. She is advised to quit smoking.

## 2013-05-07 NOTE — Patient Instructions (Addendum)
Your physician recommends that you schedule a follow-up appointment in:  1 month with Dr Domenic Polite   Your physician has recommended you make the following change in your medication:   Mucinex 600 mg tablets twice a day for 4 days

## 2013-05-07 NOTE — Assessment & Plan Note (Signed)
She has continued taking medication that were discontinued on discharge. I have gone over each medication with her, and placed the HCTZ, Coreg, and meloxicam aside. She is to continue others. She will be seen again in 1 month with Dr.McDowell.  She may need to be placed back on lower dose of BB to assist with BP control should it be elevated. She is slightly elevated today, but is anxious with somatic complaints concerning stress associated with insurance and disability.

## 2013-05-07 NOTE — Progress Notes (Deleted)
Name: Holly May    DOB: 1950-06-08  Age: 63 y.o.  MR#: [id number]       PCP:  No PCP Per Patient      Insurance: Payor: MEDICAID POTENTIAL / Plan: MEDICAID POTENTIAL / Product Type: *No Product type* /   CC:    Chief Complaint  Patient presents with  . Loss of Consciousness  . Hypertension    VS Filed Vitals:   05/07/13 1408  BP: 157/76  Pulse: 95  Height: 5\' 7"  (1.702 m)  Weight: 142 lb (64.411 kg)    Weights Current Weight  05/07/13 142 lb (64.411 kg)  04/06/13 137 lb (62.143 kg)  03/31/13 143 lb (64.864 kg)    Blood Pressure  BP Readings from Last 3 Encounters:  05/07/13 157/76  04/06/13 110/66  04/02/13 149/73     Admit date:  (Not on file) Last encounter with RMR:  Visit date not found   Allergy Review of patient's allergies indicates no known allergies.  Current Outpatient Prescriptions  Medication Sig Dispense Refill  . acetaminophen (TYLENOL) 325 MG tablet Take 2 tablets (650 mg total) by mouth every 6 (six) hours as needed for mild pain (or Fever >/= 101).      Marland Kitchen albuterol (PROVENTIL HFA;VENTOLIN HFA) 108 (90 BASE) MCG/ACT inhaler Inhale 2 puffs into the lungs every 6 (six) hours as needed for wheezing.      Marland Kitchen amLODipine (NORVASC) 10 MG tablet Take 10 mg by mouth daily.      . carvedilol (COREG) 12.5 MG tablet Take 12.5 mg by mouth 2 (two) times daily with a meal.      . citalopram (CELEXA) 40 MG tablet Take 40 mg by mouth daily.      . hydrochlorothiazide (HYDRODIURIL) 25 MG tablet Take 25 mg by mouth daily.      Marland Kitchen HYDROcodone-acetaminophen (NORCO) 7.5-325 MG per tablet Take 1 tablet by mouth every 4 (four) hours as needed for moderate pain.  90 tablet  0  . lisinopril (PRINIVIL,ZESTRIL) 10 MG tablet Take 1 tablet (10 mg total) by mouth daily.  30 tablet  1  . lovastatin (MEVACOR) 40 MG tablet Take 40 mg by mouth at bedtime.      . meloxicam (MOBIC) 15 MG tablet Take 1/2 tablet daily      . metFORMIN (GLUCOPHAGE) 500 MG tablet Take 1 tablet (500 mg  total) by mouth 2 (two) times daily with a meal.  60 tablet  1  . oxybutynin (DITROPAN) 5 MG tablet Take 5 mg by mouth 2 (two) times daily.       . pantoprazole (PROTONIX) 40 MG tablet Take 1 tablet (40 mg total) by mouth daily before supper.  30 tablet  0  . traMADol (ULTRAM) 50 MG tablet Take 50 mg by mouth every 6 (six) hours as needed for pain.       No current facility-administered medications for this visit.    Discontinued Meds:   There are no discontinued medications.  Patient Active Problem List   Diagnosis Date Noted  . Humerus shaft fracture 04/06/2013  . Closed fracture of unspecified part of upper end of humerus 04/06/2013  . Syncope 03/31/2013  . Fracture of left humerus 03/31/2013  . DM type 2 (diabetes mellitus, type 2) 03/31/2013  . HTN (hypertension) 03/31/2013  . Heme positive stool 03/31/2013  . Normocytic anemia 03/31/2013  . Tobacco abuse 03/31/2013    LABS    Component Value Date/Time   NA 134* 04/01/2013 1601  NA 132* 03/31/2013 0358   NA 140 12/17/2012 1308   K 3.6* 04/01/2013 0618   K 3.3* 03/31/2013 0358   K 3.6 12/17/2012 1308   CL 96 04/01/2013 0618   CL 91* 03/31/2013 0358   CL 102 12/17/2012 1308   CO2 24 04/01/2013 0618   CO2 27 03/31/2013 0358   CO2 31 12/17/2012 1308   GLUCOSE 153* 04/01/2013 0618   GLUCOSE 193* 03/31/2013 0358   GLUCOSE 102* 12/17/2012 1308   BUN 10 04/01/2013 0618   BUN 29* 03/31/2013 0358   BUN 10 12/17/2012 1308   CREATININE 0.54 04/01/2013 0618   CREATININE 0.75 03/31/2013 0358   CREATININE 0.73 12/17/2012 1308   CALCIUM 8.8 04/01/2013 0618   CALCIUM 9.2 03/31/2013 0358   CALCIUM 9.7 12/17/2012 1308   GFRNONAA >90 04/01/2013 0618   GFRNONAA >90 03/31/2013 0358   GFRNONAA >90 12/17/2012 1308   GFRAA >90 04/01/2013 0618   GFRAA >90 03/31/2013 0358   GFRAA >90 12/17/2012 1308   CMP     Component Value Date/Time   NA 134* 04/01/2013 0618   K 3.6* 04/01/2013 0618   CL 96 04/01/2013 0618   CO2 24 04/01/2013 0618   GLUCOSE 153* 04/01/2013  0618   BUN 10 04/01/2013 0618   CREATININE 0.54 04/01/2013 0618   CALCIUM 8.8 04/01/2013 0618   PROT 7.3 04/01/2013 0618   ALBUMIN 2.6* 04/01/2013 0618   AST 17 04/01/2013 0618   ALT 11 04/01/2013 0618   ALKPHOS 95 04/01/2013 0618   BILITOT 0.5 04/01/2013 0618   GFRNONAA >90 04/01/2013 0618   GFRAA >90 04/01/2013 0618       Component Value Date/Time   WBC 15.4* 04/01/2013 0618   WBC 18.2* 03/31/2013 0358   HGB 10.2* 04/01/2013 0618   HGB 10.8* 03/31/2013 0358   HCT 30.2* 04/01/2013 0618   HCT 31.5* 03/31/2013 0358   MCV 87.3 04/01/2013 0618   MCV 87.0 03/31/2013 0358    Lipid Panel  No results found for this basename: chol, trig, hdl, cholhdl, vldl, ldlcalc    ABG No results found for this basename: phart, pco2, pco2art, po2, po2art, hco3, tco2, acidbasedef, o2sat     Lab Results  Component Value Date   TSH 0.484 03/31/2013   BNP (last 3 results) No results found for this basename: PROBNP,  in the last 8760 hours Cardiac Panel (last 3 results) No results found for this basename: CKTOTAL, CKMB, TROPONINI, RELINDX,  in the last 72 hours  Iron/TIBC/Ferritin    Component Value Date/Time   IRON 11* 03/31/2013 0859   TIBC 215* 03/31/2013 0859   FERRITIN 784* 03/31/2013 0859     EKG Orders placed during the hospital encounter of 03/31/13  . EKG 12-LEAD  . EKG 12-LEAD  . EKG     Prior Assessment and Plan Problem List as of 05/07/2013     Cardiovascular and Mediastinum   HTN (hypertension)   Syncope     Endocrine   DM type 2 (diabetes mellitus, type 2)     Musculoskeletal and Integument   Fracture of left humerus   Humerus shaft fracture   Closed fracture of unspecified part of upper end of humerus     Other   Heme positive stool   Normocytic anemia   Tobacco abuse       Imaging: No results found.

## 2013-05-10 ENCOUNTER — Encounter: Payer: Self-pay | Admitting: Gastroenterology

## 2013-05-10 NOTE — Telephone Encounter (Signed)
Mailed letter °

## 2013-06-15 ENCOUNTER — Encounter: Payer: Self-pay | Admitting: Cardiology

## 2013-06-15 ENCOUNTER — Ambulatory Visit (INDEPENDENT_AMBULATORY_CARE_PROVIDER_SITE_OTHER): Payer: Medicaid Other | Admitting: Cardiology

## 2013-06-15 VITALS — BP 127/74 | HR 92 | Ht 67.0 in | Wt 149.0 lb

## 2013-06-15 DIAGNOSIS — R55 Syncope and collapse: Secondary | ICD-10-CM

## 2013-06-15 DIAGNOSIS — Z720 Tobacco use: Secondary | ICD-10-CM

## 2013-06-15 DIAGNOSIS — F17200 Nicotine dependence, unspecified, uncomplicated: Secondary | ICD-10-CM

## 2013-06-15 NOTE — Progress Notes (Signed)
Clinical Summary [name] is a 63 y.o.female recently seen in the office by [name] NP in February 2015. She had been seen by [facility] May back in January due to an episode of unexplained syncope, hospitalization demonstrating no evidence of arrhythmia by telemetry, no ACS by enzymes, normal LVEF by echocardiogram, negative head CT for acute event, negative orthostatics, and unremarkable EEG. She did sustain a left humerus fracture and was seen by Dr. Aline [place] [facility]. We cleared her to proceed with surgery in January, however did suggest that an outpatient cardiac monitor could be considered later, particularly if she had any recurrent events. At the last visit with [name] NP, medications were reviewed, and no other testing was undertaken.  She tells me that she has seen an orthopedic surgeon in [place], no surgery is planned, and that her left humerus is healing with conservative measures. She has a brace and a sling on her left arm.  She follows with [facility] at [facility] care.  No Known Allergies  Current Outpatient Prescriptions  Medication Sig Dispense Refill  . acetaminophen (TYLENOL) 325 MG tablet Take 2 tablets (650 mg total) by mouth every 6 (six) hours as needed for mild pain (or Fever >/= 101).      Marland Kitchen albuterol (PROVENTIL HFA;VENTOLIN HFA) 108 (90 BASE) MCG/ACT inhaler Inhale 2 puffs into the lungs every 6 (six) hours as needed for wheezing.      Marland Kitchen amLODipine (NORVASC) 10 MG tablet Take 10 mg by mouth daily.      . citalopram (CELEXA) 40 MG tablet Take 40 mg by mouth daily.      Marland Kitchen guaiFENesin (MUCINEX) 600 MG 12 hr tablet Take 2 tablets (1,200 mg total) by mouth 2 (two) times daily.  16 tablet  0  . HYDROcodone-acetaminophen (NORCO) 7.5-325 MG per tablet Take 1 tablet by mouth every 4 (four) hours as needed for moderate pain.  90 tablet  0  . lisinopril (PRINIVIL,ZESTRIL) 10 MG tablet Take 1 tablet (10 mg total) by mouth daily.  30 tablet  1    . lovastatin (MEVACOR) 40 MG tablet Take 40 mg by mouth at bedtime.      . metFORMIN (GLUCOPHAGE) 500 MG tablet Take 1 tablet (500 mg total) by mouth 2 (two) times daily with a meal.  60 tablet  1  . oxybutynin (DITROPAN) 5 MG tablet Take 5 mg by mouth 2 (two) times daily.       . pantoprazole (PROTONIX) 40 MG tablet Take 1 tablet (40 mg total) by mouth daily before supper.  30 tablet  0   No current facility-administered medications for this visit.    Past Medical History  Diagnosis Date  . Essential hypertension, benign   . Type 2 diabetes mellitus   . Depression   . Arthritis     Social History [name] reports that she has been smoking Cigarettes.  She has been smoking about 1.00 pack per day. She does not have any smokeless tobacco history on file. [name] reports that she does not drink alcohol.  Review of Systems Negative except as outlined.  Physical Examination Filed Vitals:   06/15/13 1254  BP: 127/74  Pulse: 92   Filed Weights   06/15/13 1254  Weight: 149 lb (67.586 kg)    Chronically ill-appearing woman, no distress.  HEENT: Conjunctiva and lids normal, oropharynx clear with poor dentition.  Neck: Supple, no elevated JVP or carotid bruits, no thyromegaly.  Lungs:  Clear to auscultation, nonlabored breathing at rest.  Cardiac: Regular rate and rhythm, no S3 or significant systolic murmur, no pericardial rub.  Abdomen: Soft, nontender, bowel sounds present, no guarding or rebound.  Extremities: No pitting edema, distal pulses 2+. Left arm in a brace and sling.    Problem List and Plan   Syncope Not recurrent, no clear cardiac etiology was identified based on testing from January. Recommend observation at this point. If this becomes a recurrent issue, an outpatient heart monitor could be ordered. Keep followup with primary care provider. She can see [facility] May back as needed.  Tobacco abuse Tobacco cessation discussed.    Satira Sark, M.D.,  F.A.C.C.

## 2013-06-15 NOTE — Assessment & Plan Note (Signed)
Not recurrent, no clear cardiac etiology was identified based on testing from January. Recommend observation at this point. If this becomes a recurrent issue, an outpatient heart monitor could be ordered. Keep followup with primary care provider. She can see [place] back as needed.

## 2013-06-15 NOTE — Patient Instructions (Signed)
Your physician recommends that you schedule a follow-up appointment only if needed    Your physician recommends that you continue on your current medications as directed. Please refer to the Current Medication list given to you today.      Thank you for choosing [facility] !   

## 2013-06-15 NOTE — Assessment & Plan Note (Signed)
Tobacco cessation discussed 

## 2013-12-16 ENCOUNTER — Other Ambulatory Visit: Payer: Self-pay | Admitting: Orthopedic Surgery

## 2013-12-16 DIAGNOSIS — M898X2 Other specified disorders of bone, upper arm: Secondary | ICD-10-CM

## 2013-12-16 DIAGNOSIS — M79622 Pain in left upper arm: Secondary | ICD-10-CM

## 2013-12-21 ENCOUNTER — Ambulatory Visit: Payer: Medicaid Other

## 2013-12-21 ENCOUNTER — Other Ambulatory Visit: Payer: Self-pay | Admitting: Orthopedic Surgery

## 2013-12-21 ENCOUNTER — Ambulatory Visit
Admission: RE | Admit: 2013-12-21 | Discharge: 2013-12-21 | Disposition: A | Discharge status: Home / Self Care | Payer: Medicaid Other | Source: Ambulatory Visit | Attending: Orthopedic Surgery | Admitting: Orthopedic Surgery

## 2013-12-21 DIAGNOSIS — M79622 Pain in left upper arm: Secondary | ICD-10-CM

## 2013-12-21 DIAGNOSIS — M898X2 Other specified disorders of bone, upper arm: Secondary | ICD-10-CM

## 2014-04-19 ENCOUNTER — Other Ambulatory Visit: Payer: Self-pay | Admitting: Orthopedic Surgery

## 2014-04-19 DIAGNOSIS — S42322K Displaced transverse fracture of shaft of humerus, left arm, subsequent encounter for fracture with nonunion: Secondary | ICD-10-CM

## 2014-04-22 ENCOUNTER — Other Ambulatory Visit: Payer: Medicaid Other

## 2014-04-25 ENCOUNTER — Ambulatory Visit
Admission: RE | Admit: 2014-04-25 | Discharge: 2014-04-25 | Disposition: A | Discharge status: Home / Self Care | Payer: Medicaid Other | Source: Ambulatory Visit | Attending: Orthopedic Surgery | Admitting: Orthopedic Surgery

## 2014-04-25 DIAGNOSIS — S42322K Displaced transverse fracture of shaft of humerus, left arm, subsequent encounter for fracture with nonunion: Secondary | ICD-10-CM

## 2014-05-18 ENCOUNTER — Ambulatory Visit (INDEPENDENT_AMBULATORY_CARE_PROVIDER_SITE_OTHER): Payer: Medicaid Other | Admitting: Cardiology

## 2014-05-18 ENCOUNTER — Encounter: Payer: Self-pay | Admitting: *Deleted

## 2014-05-18 ENCOUNTER — Encounter: Payer: Self-pay | Admitting: Cardiology

## 2014-05-18 VITALS — BP 138/66 | HR 73 | Ht 67.0 in | Wt 157.0 lb

## 2014-05-18 DIAGNOSIS — Z9189 Other specified personal risk factors, not elsewhere classified: Secondary | ICD-10-CM

## 2014-05-18 DIAGNOSIS — Z87898 Personal history of other specified conditions: Secondary | ICD-10-CM

## 2014-05-18 DIAGNOSIS — Z01810 Encounter for preprocedural cardiovascular examination: Secondary | ICD-10-CM

## 2014-05-18 DIAGNOSIS — R9431 Abnormal electrocardiogram [ECG] [EKG]: Secondary | ICD-10-CM | POA: Insufficient documentation

## 2014-05-18 DIAGNOSIS — Z720 Tobacco use: Secondary | ICD-10-CM

## 2014-05-18 DIAGNOSIS — I10 Essential (primary) hypertension: Secondary | ICD-10-CM

## 2014-05-18 NOTE — Patient Instructions (Signed)
Your physician recommends that you schedule a follow-up appointment in: TBD  Your physician recommends that you continue on your current medications as directed. Please refer to the Current Medication list given to you today.  Your physician has requested that you have en exercise stress myoview. For further information please visit [email or web address]. Please follow instruction sheet, as given.  Thank you for choosing [facility]!

## 2014-05-18 NOTE — Progress Notes (Signed)
Cardiology Office Note  Date: 05/18/2014   ID: Holly, May 1951/03/11, MRN [id number]  PCP: Bronson Curb PA-C  Primary Cardiologist: Rozann Lesches, MD   Chief Complaint  Patient presents with  . Preoperative evaluation    History of Present Illness: Holly May is a 64 y.o. female referred for cardiology consultation by Mr. [employer]. She is being considered for elective left arm surgery. Our last visit with her was in March 2015 in follow-up of an episode of unexplained syncope that she had had back in December 2014. Evaluation at that time showed normal LVEF and no cardiac arrhythmias, no evidence of ACS by cardiac enzymes. She has had no further syncopal events.  She has no documented history of obstructive CAD or myocardial infarction. Recent ECG is abnormal and reviewed below. She denies any recurring chest pain symptoms. She has not undergone any objective ischemic cardiac testing.  She has been trying to quit smoking, having a lot of difficulty with this despite using nicotine patches.  We reviewed her medications, she continues on Norvasc, lisinopril, and Mevacor.  Past Medical History  Diagnosis Date  . Essential hypertension, benign   . Type 2 diabetes mellitus   . Depression   . Arthritis   . Urinary incontinence     Past Surgical History  Procedure Laterality Date  . Cholecystectomy      Current Outpatient Prescriptions  Medication Sig Dispense Refill  . acetaminophen (TYLENOL) 325 MG tablet Take 2 tablets (650 mg total) by mouth every 6 (six) hours as needed for mild pain (or Fever >/= 101).    Marland Kitchen albuterol (PROVENTIL HFA;VENTOLIN HFA) 108 (90 BASE) MCG/ACT inhaler Inhale 2 puffs into the lungs every 6 (six) hours as needed for wheezing.    . citalopram (CELEXA) 40 MG tablet Take 40 mg by mouth daily.    Marland Kitchen gabapentin (NEURONTIN) 300 MG capsule Take 300 mg by mouth at bedtime.    Marland Kitchen guaiFENesin (MUCINEX) 600 MG 12 hr tablet Take 2  tablets (1,200 mg total) by mouth 2 (two) times daily. 16 tablet 0  . lisinopril (PRINIVIL,ZESTRIL) 40 MG tablet Take 40 mg by mouth daily.    Marland Kitchen lovastatin (MEVACOR) 40 MG tablet Take 40 mg by mouth at bedtime.    . metFORMIN (GLUCOPHAGE) 1000 MG tablet Take 1,000 mg by mouth 2 (two) times daily with a meal.    . metoprolol succinate (TOPROL-XL) 25 MG 24 hr tablet Take 25 mg by mouth daily.    Marland Kitchen oxybutynin (DITROPAN) 5 MG tablet Take 5 mg by mouth 2 (two) times daily.     . pantoprazole (PROTONIX) 40 MG tablet Take 1 tablet (40 mg total) by mouth daily before supper. 30 tablet 0   No current facility-administered medications for this visit.    Allergies:  Review of patient's allergies indicates no known allergies.   Social History: The patient  reports that she has been smoking Cigarettes.  She started smoking about 43 years ago. She has a 43 pack-year smoking history. She has never used smokeless tobacco. She reports that she does not drink alcohol or use illicit drugs.   Family History: The patient's family history includes Diabetes Mellitus II in her mother and sister; Hypertension in her brother and sister.   ROS:  Please see the history of present illness. Otherwise, complete review of systems is positive for upper left arm musculoskeletal discomfort.  All other systems are reviewed and negative.    Physical  Exam: VS:  BP 138/66 mmHg  Pulse 73  Ht 5\' 7"  (1.702 m)  Wt 157 lb (71.215 kg)  BMI 24.58 kg/m2  SpO2 96%, BMI Body mass index is 24.58 kg/(m^2).  Wt Readings from Last 3 Encounters:  05/18/14 157 lb (71.215 kg)  06/15/13 149 lb (67.586 kg)  05/07/13 142 lb (64.411 kg)     General: Patient appears comfortable at rest. HEENT: Conjunctiva and lids normal, oropharynx clear with moist mucosa. Neck: Supple, no elevated JVP, no thyromegaly. Lungs: Clear to auscultation, nonlabored breathing at rest. Cardiac: Regular rate and rhythm, no S3, soft right basal systolic murmur, no  pericardial rub. Abdomen: Soft, nontender, bowel sounds present, no guarding or rebound. Extremities: No pitting edema, distal pulses 2+. Skin: Warm and dry. Musculoskeletal: No kyphosis. Neuropsychiatric: Alert and oriented x3, affect grossly appropriate.   ECG: Outside ECG from 05/03/2014 reviewed finding sinus rhythm with nonspecific lateral ST segment abnormalities and nonspecific T-wave changes. Changes are similar in comparison to previous tracing from February 2015.  Recent Labwork:  Lab work from January 2015 showed hemoglobin 10.2, platelets 261, potassium 3.6, BUN 10, creatinine 0.5.  Other Studies Reviewed Today:  Echocardiogram 04/02/2013: Study Conclusions  - Left ventricle: The cavity size was normal. Wall thickness was increased in a pattern of mild to moderate LVH. Systolic function was normal. The estimated ejection fraction was in the range of 60% to 65%. Wall motion was normal; there were no regional wall motion abnormalities. Doppler parameters are consistent with abnormal left ventricular relaxation (grade 1 diastolic dysfunction). - Aortic valve: Mildly calcified annulus. Trileaflet. No significant regurgitation. Mean gradient: 52mm Hg (S). - Mitral valve: Calcified annulus. Trivial regurgitation. - Right atrium: Central venous pressure: 66mm Hg (est). - Tricuspid valve: Trivial regurgitation. - Pulmonary arteries: Systolic pressure could not be accurately estimated. - Pericardium, extracardiac: A prominent pericardial fat pad was present. A small pericardial effusion was identified anterior to the heart. No evidence of tamponade. Impressions:  - No prior study for comparison. Mild to moderate LVH with LVEF 55-97%, grade 1 diastolic dysfunction. Trivial mitral and tricuspid regurgitation. Unable to assess PASP. Small anterior pericardial effusion - no obvious hemodynamic significance at this point.  ASSESSMENT AND PLAN:  1.  Preoperative evaluation prior to elective left arm surgery, presumably under general anesthesia. Patient has no documented history of ischemic heart disease or myocardial infarction, although baseline ECG is abnormal as outlined above, and she does have cardiac risk factors including hypertension, type 2 diabetes mellitus, and tobacco abuse. LVEF was normal as of January 2015. She has not undergone previous ischemic testing, and we will proceed with an exercise Cardiolite for further risk stratification.  2. Prior history of unexplained syncope, no recurrence.  3. Essential hypertension, on lisinopril and Norvasc.  4. Ongoing tobacco abuse, smoking cessation discussed.   Current medicines are reviewed at length with the patient today.  The patient does not have concerns regarding medicines.  Orders Placed This Encounter  Procedures  . Myocardial Perfusion Imaging   Disposition: We will call with test results and forward to surgeon when more information available.   Signed, Satira Sark, MD, [facility] 05/18/2014 2:51 PM    [facility] [address] [place], [place] [address] Phone: [phone]; Fax: [phone]

## 2014-05-23 ENCOUNTER — Telehealth: Payer: Self-pay | Admitting: *Deleted

## 2014-05-23 NOTE — Telephone Encounter (Signed)
Will forward to Dr. Domenic Polite as [facility] as pt is scheduled to have stress test 2/25 and will await results.

## 2014-05-23 NOTE — Telephone Encounter (Signed)
Pt was told to get [place] orthopaedic doctor names dr Legrand Como handy [phone] fax [phone] [id number] [place] [phone]

## 2014-05-26 ENCOUNTER — Ambulatory Visit (HOSPITAL_COMMUNITY)
Admission: RE | Admit: 2014-05-26 | Discharge: 2014-05-26 | Disposition: A | Discharge status: Home / Self Care | Payer: Medicaid Other | Source: Ambulatory Visit | Attending: Cardiology | Admitting: Cardiology

## 2014-05-26 ENCOUNTER — Encounter (HOSPITAL_COMMUNITY)
Admission: RE | Admit: 2014-05-26 | Discharge: 2014-05-26 | Disposition: A | Discharge status: Home / Self Care | Payer: Medicaid Other | Source: Ambulatory Visit | Attending: Cardiology | Admitting: Cardiology

## 2014-05-26 ENCOUNTER — Encounter (HOSPITAL_COMMUNITY): Payer: Self-pay

## 2014-05-26 DIAGNOSIS — Z01810 Encounter for preprocedural cardiovascular examination: Secondary | ICD-10-CM

## 2014-05-26 DIAGNOSIS — R9431 Abnormal electrocardiogram [ECG] [EKG]: Secondary | ICD-10-CM

## 2014-05-26 DIAGNOSIS — I10 Essential (primary) hypertension: Secondary | ICD-10-CM | POA: Diagnosis not present

## 2014-05-26 DIAGNOSIS — Z87891 Personal history of nicotine dependence: Secondary | ICD-10-CM | POA: Diagnosis not present

## 2014-05-26 MED ORDER — TECHNETIUM TC 99M SESTAMIBI GENERIC - CARDIOLITE
30.0000 | Freq: Once | INTRAVENOUS | Status: AC | PRN
  Administered 2014-05-26: 30 via INTRAVENOUS

## 2014-05-26 MED ORDER — SODIUM CHLORIDE 0.9 % IJ SOLN
10.0000 mL | INTRAMUSCULAR | Status: DC | PRN
  Administered 2014-05-26: 10 mL via INTRAVENOUS
  Filled 2014-05-26: qty 10

## 2014-05-26 MED ORDER — TECHNETIUM TC 99M SESTAMIBI - CARDIOLITE
10.0000 | Freq: Once | INTRAVENOUS | Status: AC | PRN
  Administered 2014-05-26: 09:00:00 10 via INTRAVENOUS

## 2014-05-26 MED ORDER — REGADENOSON 0.4 MG/5ML IV SOLN
0.4000 mg | Freq: Once | INTRAVENOUS | Status: AC | PRN
  Administered 2014-05-26: 0.4 mg via INTRAVENOUS

## 2014-05-26 MED ORDER — SODIUM CHLORIDE 0.9 % IJ SOLN
INTRAMUSCULAR | Status: AC
  Administered 2014-05-26: 10 mL via INTRAVENOUS
  Filled 2014-05-26: qty 3

## 2014-05-26 MED ORDER — REGADENOSON 0.4 MG/5ML IV SOLN
INTRAVENOUS | Status: AC
  Administered 2014-05-26: 0.4 mg via INTRAVENOUS
  Filled 2014-05-26: qty 5

## 2014-05-26 NOTE — Progress Notes (Signed)
Stress Lab Nurses Notes - Holly May  Holly May 05/26/2014 Reason for doing test: Surgical Clearance and abnormal EKG Type of test: Test Changed unable to reach THR, Lexiscan given Nurse performing test: Gerrit Halls, RN Nuclear Medicine Tech: Melburn Hake Echo Tech: Not Applicable MD performing test: Branch/K.Purcell Nails NP Family MD: Karna Dupes PA Test explained and consent signed: Yes.   IV started: Saline lock flushed, No redness or edema and Saline lock started in radiology Symptoms: SOB while on TM. No other symptoms noted with Lexiscan. Treatment/Intervention: None Reason test stopped: protocol completed After recovery IV was: Discontinued via X-ray tech and No redness or edema Patient to return to [place]. Med at :11:15 Patient discharged: Home Patient's Condition upon discharge was: stable Comments: During test peak BP 221/107 & HR 104 .  Recovery BP 203/100 & HR 77 .  Symptoms resolved in recovery. Holly May

## 2015-04-24 ENCOUNTER — Emergency Department (HOSPITAL_COMMUNITY)
Admission: EM | Admit: 2015-04-24 | Discharge: 2015-04-24 | Disposition: A | Discharge status: Home / Self Care | Payer: Medicaid Other | Attending: Emergency Medicine | Admitting: Emergency Medicine

## 2015-04-24 ENCOUNTER — Encounter (HOSPITAL_COMMUNITY): Payer: Self-pay | Admitting: *Deleted

## 2015-04-24 ENCOUNTER — Emergency Department (HOSPITAL_COMMUNITY): Payer: Medicaid Other

## 2015-04-24 DIAGNOSIS — F329 Major depressive disorder, single episode, unspecified: Secondary | ICD-10-CM | POA: Insufficient documentation

## 2015-04-24 DIAGNOSIS — S22000A Wedge compression fracture of unspecified thoracic vertebra, initial encounter for closed fracture: Secondary | ICD-10-CM | POA: Insufficient documentation

## 2015-04-24 DIAGNOSIS — W01198A Fall on same level from slipping, tripping and stumbling with subsequent striking against other object, initial encounter: Secondary | ICD-10-CM | POA: Insufficient documentation

## 2015-04-24 DIAGNOSIS — Z79899 Other long term (current) drug therapy: Secondary | ICD-10-CM | POA: Insufficient documentation

## 2015-04-24 DIAGNOSIS — S2242XA Multiple fractures of ribs, left side, initial encounter for closed fracture: Secondary | ICD-10-CM | POA: Insufficient documentation

## 2015-04-24 DIAGNOSIS — W19XXXA Unspecified fall, initial encounter: Secondary | ICD-10-CM

## 2015-04-24 DIAGNOSIS — Y9389 Activity, other specified: Secondary | ICD-10-CM | POA: Insufficient documentation

## 2015-04-24 DIAGNOSIS — Y9289 Other specified places as the place of occurrence of the external cause: Secondary | ICD-10-CM | POA: Insufficient documentation

## 2015-04-24 DIAGNOSIS — I10 Essential (primary) hypertension: Secondary | ICD-10-CM | POA: Insufficient documentation

## 2015-04-24 DIAGNOSIS — Y998 Other external cause status: Secondary | ICD-10-CM | POA: Diagnosis not present

## 2015-04-24 DIAGNOSIS — R05 Cough: Secondary | ICD-10-CM | POA: Diagnosis not present

## 2015-04-24 DIAGNOSIS — F17210 Nicotine dependence, cigarettes, uncomplicated: Secondary | ICD-10-CM | POA: Insufficient documentation

## 2015-04-24 DIAGNOSIS — E119 Type 2 diabetes mellitus without complications: Secondary | ICD-10-CM | POA: Diagnosis not present

## 2015-04-24 DIAGNOSIS — Z7984 Long term (current) use of oral hypoglycemic drugs: Secondary | ICD-10-CM | POA: Insufficient documentation

## 2015-04-24 DIAGNOSIS — S29001A Unspecified injury of muscle and tendon of front wall of thorax, initial encounter: Secondary | ICD-10-CM | POA: Diagnosis present

## 2015-04-24 MED ORDER — HYDROCODONE-ACETAMINOPHEN 5-325 MG PO TABS: 1.0000 | ORAL_TABLET | Freq: Four times a day (QID) | ORAL | Status: DC | PRN

## 2015-04-24 MED ORDER — DOXYCYCLINE HYCLATE 100 MG PO CAPS: 100.0000 mg | ORAL_CAPSULE | Freq: Two times a day (BID) | ORAL | Status: DC

## 2015-04-24 NOTE — ED Notes (Signed)
Pt states she fell on wood pile while getting wood into the house on Saturday. Pain left upper back pain and left chest pain. Pt states bad "pulling pain" Hurts to take a deep breath. Hurts worse with certain movements. Pt states she fell on the left side also. Pain with palpation.

## 2015-04-24 NOTE — ED Provider Notes (Signed)
CSN: [id number]     Arrival date & time 04/24/15  0800 History  By signing my name below, I, Terressa Koyanagi, attest that this documentation has been prepared under the direction and in the presence of Milton Ferguson, MD. Electronically Signed: Terressa Koyanagi, ED Scribe. 04/24/2015. 9:01 AM.   Chief Complaint  Patient presents with  . Fall   Patient is a 65 y.o. female presenting with fall. The history is provided by the patient (The patient states she fell on her left chest Saturday). No language interpreter was used.  Fall This is a new problem. The current episode started 2 days ago. The problem occurs hourly. The problem has not changed since onset.Associated symptoms include chest pain (left sided chest wall pain). Pertinent negatives include no abdominal pain and no headaches. The symptoms are aggravated by twisting, coughing and bending. Nothing relieves the symptoms. She has tried nothing for the symptoms.   PCP: Bronson Curb, PA-C HPI Comments: Holly May is a 65 y.o. female, with PMHx noted below including daily tobacco use (1 ppd), who presents to the Emergency Department complaining of an unwitnessed fall sustained 2 days ago. Pt reports she bent down to pick something up, became dizzy, lost her balance, and fell hitting her chest and her back. Pt denies LOC. Associated Sx include upper back pain, left sided chest pain, and productive cough with white sputum. Aggravating factors include deep inhalations, coughing, movement.   Past Medical History  Diagnosis Date  . Essential hypertension, benign   . Type 2 diabetes mellitus ([place])   . Depression   . Arthritis   . Urinary incontinence    Past Surgical History  Procedure Laterality Date  . Cholecystectomy     Family History  Problem Relation Age of Onset  . Diabetes Mellitus II Sister   . Diabetes Mellitus II Mother   . Hypertension Brother   . Hypertension Sister    Social History  Substance Use Topics  . Smoking  status: Current Every Day Smoker -- 1.00 packs/day for 43 years    Types: Cigarettes    Start date: 01/09/1971  . Smokeless tobacco: Never Used  . Alcohol Use: No   OB History    No data available     Review of Systems  Constitutional: Negative for appetite change and fatigue.  HENT: Negative for congestion, ear discharge and sinus pressure.   Eyes: Negative for discharge.  Respiratory: Positive for cough.   Cardiovascular: Positive for chest pain (left sided chest wall pain).  Gastrointestinal: Negative for abdominal pain and diarrhea.  Genitourinary: Negative for frequency and hematuria.  Musculoskeletal: Positive for back pain.  Skin: Negative for rash.  Neurological: Negative for seizures and headaches.  Psychiatric/Behavioral: Negative for hallucinations.   Allergies  Review of patient's allergies indicates no known allergies.  Home Medications   Prior to Admission medications   Medication Sig Start Date End Date Taking? Authorizing Provider  acetaminophen (TYLENOL) 325 MG tablet Take 2 tablets (650 mg total) by mouth every 6 (six) hours as needed for mild pain (or Fever >/= 101). 04/02/13   Samuella Cota, MD  albuterol (PROVENTIL HFA;VENTOLIN HFA) 108 (90 BASE) MCG/ACT inhaler Inhale 2 puffs into the lungs every 6 (six) hours as needed for wheezing.    Historical Provider, MD  citalopram (CELEXA) 40 MG tablet Take 40 mg by mouth daily.    Historical Provider, MD  gabapentin (NEURONTIN) 300 MG capsule Take 300 mg by mouth at bedtime.  Historical Provider, MD  guaiFENesin (MUCINEX) 600 MG 12 hr tablet Take 2 tablets (1,200 mg total) by mouth 2 (two) times daily. 05/07/13   Lendon Colonel, NP  lisinopril (PRINIVIL,ZESTRIL) 40 MG tablet Take 40 mg by mouth daily.    Historical Provider, MD  lovastatin (MEVACOR) 40 MG tablet Take 40 mg by mouth at bedtime.    Historical Provider, MD  metFORMIN (GLUCOPHAGE) 1000 MG tablet Take 1,000 mg by mouth 2 (two) times daily with a  meal.    Historical Provider, MD  metoprolol succinate (TOPROL-XL) 25 MG 24 hr tablet Take 25 mg by mouth daily.    Historical Provider, MD  oxybutynin (DITROPAN) 5 MG tablet Take 5 mg by mouth 2 (two) times daily.     Historical Provider, MD  pantoprazole (PROTONIX) 40 MG tablet Take 1 tablet (40 mg total) by mouth daily before supper. 04/02/13   Samuella Cota, MD   Triage Vitals: BP 156/71 mmHg  Pulse 64  Temp(Src) 98 F (36.7 C) (Oral)  Resp 16  Ht 5\' 7"  (1.702 m)  Wt 157 lb (71.215 kg)  BMI 24.58 kg/m2  SpO2 100% Physical Exam  Constitutional: She is oriented to person, place, and time. She appears well-developed.  HENT:  Head: Normocephalic.  Eyes: Conjunctivae and EOM are normal. No scleral icterus.  Neck: Neck supple. No thyromegaly present.  Cardiovascular: Normal rate and regular rhythm.  Exam reveals no gallop and no friction rub.   No murmur heard. Pulmonary/Chest: No stridor. She has no wheezes. She has rales (bilaterally). She exhibits tenderness (left lateral chest).  Abdominal: She exhibits no distension. There is no tenderness. There is no rebound.  Musculoskeletal: Normal range of motion. She exhibits no edema.  Lymphadenopathy:    She has no cervical adenopathy.  Neurological: She is oriented to person, place, and time. She exhibits normal muscle tone. Coordination normal.  Skin: No rash noted. No erythema.  Psychiatric: She has a normal mood and affect. Her behavior is normal.    ED Course  Procedures (including critical care time) DIAGNOSTIC STUDIES: Oxygen Saturation is 100% on ra, nl by my interpretation.    COORDINATION OF CARE: 8:52 AM: Discussed treatment plan which includes imaging results, antibiotics, and f/u with PCP in 2-3 days with pt at bedside; patient verbalizes understanding and agrees with treatment plan.  Imaging Review Dg Chest 2 View  04/24/2015  CLINICAL DATA:  Fall.  Pain. EXAM: CHEST  2 VIEW COMPARISON:  03/31/2013. FINDINGS:  Patient is rotated to the right. Mild prominence of the ascending thoracic aorta is noted. An ascending thoracic aortic aneurysm cannot be excluded. Hilar structures are unremarkable. Heart size normal. Nodular densities in the lung bases again noted. these on PA view only consistent nipple shadows. No pulmonary infiltrate. Multiple left-sided rib fractures are present. No pneumothorax. IMPRESSION: 1. Multiple left-sided rib fractures. Mild mid thoracic spine compression fracture, age undetermined. No pneumothorax. 2. Mild prominence of the ascending aorta. Ascending thoracic aortic aneurysm cannot be excluded. Electronically Signed   By: Marcello Moores  Register   On: 04/24/2015 08:44   I have personally reviewed and evaluated these images as part of my medical decision-making.  MDM   Final diagnoses:  Fall, initial encounter    Patient with multiple rib fractures. She will be treated with Vicodin also given doxycycline for some lung congestion. Patient will follow-up with her PCP this week for recheck  The chart was scribed for me under my direct supervision.  I personally performed  the history, physical, and medical decision making and all procedures in the evaluation of this patient.Milton Ferguson, MD 04/24/15 [phone]

## 2015-04-24 NOTE — Discharge Instructions (Signed)
Follow up with your md later this week for recheck °

## 2015-05-19 ENCOUNTER — Encounter: Payer: Self-pay | Admitting: Cardiology

## 2015-05-19 ENCOUNTER — Ambulatory Visit (INDEPENDENT_AMBULATORY_CARE_PROVIDER_SITE_OTHER): Payer: Medicaid Other | Admitting: Cardiology

## 2015-05-19 VITALS — BP 114/66 | HR 63 | Ht 69.0 in | Wt 149.0 lb

## 2015-05-19 DIAGNOSIS — R938 Abnormal findings on diagnostic imaging of other specified body structures: Secondary | ICD-10-CM

## 2015-05-19 DIAGNOSIS — R9389 Abnormal findings on diagnostic imaging of other specified body structures: Secondary | ICD-10-CM

## 2015-05-19 DIAGNOSIS — I10 Essential (primary) hypertension: Secondary | ICD-10-CM

## 2015-05-19 DIAGNOSIS — R0989 Other specified symptoms and signs involving the circulatory and respiratory systems: Secondary | ICD-10-CM

## 2015-05-19 DIAGNOSIS — I773 Arterial fibromuscular dysplasia: Secondary | ICD-10-CM

## 2015-05-19 DIAGNOSIS — S2239XD Fracture of one rib, unspecified side, subsequent encounter for fracture with routine healing: Secondary | ICD-10-CM | POA: Diagnosis not present

## 2015-05-19 DIAGNOSIS — R9431 Abnormal electrocardiogram [ECG] [EKG]: Secondary | ICD-10-CM

## 2015-05-19 NOTE — Patient Instructions (Signed)
Your physician recommends that you schedule a follow-up appointment in: to be determined    Your physician recommends that you continue on your current medications as directed. Please refer to the Current Medication list given to you today.     Please schedule chest CT, we will call you with results.      Thank you for choosing [facility] !

## 2015-05-19 NOTE — Progress Notes (Signed)
Cardiology Office Note  Date: 05/19/2015   ID: Mikhaela, Giorgi [birth year], MRN [id number]  PCP: [facility]  Primary Cardiologist: Rozann Lesches, MD   Chief Complaint  Patient presents with  . Abnormal chest x-ray    History of Present Illness: Holly May is a 65 y.o. female referred back to the office by Dr. Terance Hart with [facility]. She was seen recently in the [facility] after a mechanical fall. She was diagnosed with rib fractures and treated with analgesics. Chest x-ray incidentally reported mild prominence of the ascending aorta, aneurysm not excluded.  She states that she is doing well in terms of her rib fractures, no significant pain. She has had no falls, dizziness, palpitations, or exertional chest pain in the interim. ECG today shows sinus rhythm with diffuse nonspecific ST-T wave abnormalities in the absence of active symptoms.  She does not have any known history of PAD or aneurysmal disease. We discussed the results of her chest x-ray and plan to clarify with a chest CTA to better evaluate the aorta.  I reviewed her medications which are outlined below. Blood pressure regimen includes lisinopril and metoprolol. She is also on Mevacor for management of her lipids. Most recent lab work is outlined below.  Past Medical History  Diagnosis Date  . Essential hypertension, benign   . Type 2 diabetes mellitus ([place])   . Depression   . Arthritis   . Urinary incontinence     Past Surgical History  Procedure Laterality Date  . Cholecystectomy      Current Outpatient Prescriptions  Medication Sig Dispense Refill  . acetaminophen (TYLENOL) 325 MG tablet Take 2 tablets (650 mg total) by mouth every 6 (six) hours as needed for mild pain (or Fever >/= 101).    Marland Kitchen albuterol (PROVENTIL HFA;VENTOLIN HFA) 108 (90 BASE) MCG/ACT inhaler Inhale 2 puffs into the lungs every 6 (six) hours as needed for wheezing.    . citalopram  (CELEXA) 40 MG tablet Take 40 mg by mouth daily.    Marland Kitchen gabapentin (NEURONTIN) 300 MG capsule Take 300 mg by mouth at bedtime.    . hydrochlorothiazide (HYDRODIURIL) 25 MG tablet Take 25 mg by mouth daily.    Marland Kitchen lisinopril (PRINIVIL,ZESTRIL) 40 MG tablet Take 40 mg by mouth daily.    Marland Kitchen lovastatin (MEVACOR) 40 MG tablet Take 40 mg by mouth at bedtime.    . metFORMIN (GLUCOPHAGE) 1000 MG tablet Take 1,000 mg by mouth 2 (two) times daily with a meal.    . metoprolol (LOPRESSOR) 100 MG tablet Take 100 mg by mouth daily.    . metoprolol succinate (TOPROL-XL) 25 MG 24 hr tablet Take 25 mg by mouth daily.    Marland Kitchen oxybutynin (DITROPAN) 5 MG tablet Take 5 mg by mouth 2 (two) times daily.     Marland Kitchen guaiFENesin (MUCINEX) 600 MG 12 hr tablet Take 2 tablets (1,200 mg total) by mouth 2 (two) times daily. (Patient not taking: Reported on 05/19/2015) 16 tablet 0   No current facility-administered medications for this visit.   Allergies:  Review of patient's allergies indicates no known allergies.   Social History: The patient  reports that she has been smoking Cigarettes.  She started smoking about 44 years ago. She has a 43 pack-year smoking history. She has never used smokeless tobacco. She reports that she does not drink alcohol or use illicit drugs.   ROS:  Please see the history of present  illness. Otherwise, complete review of systems is positive for NYHA class II dyspnea.  All other systems are reviewed and negative.   Physical Exam: VS:  BP 114/66 mmHg  Pulse 63  Ht 5\' 9"  (1.753 m)  Wt 149 lb (67.586 kg)  BMI 21.99 kg/m2  SpO2 98%, BMI Body mass index is 21.99 kg/(m^2).  Wt Readings from Last 3 Encounters:  05/19/15 149 lb (67.586 kg)  04/24/15 157 lb (71.215 kg)  05/18/14 157 lb (71.215 kg)    General: Patient appears comfortable at rest. HEENT: Conjunctiva and lids normal, oropharynx clear. Neck: Supple, no elevated JVP, no thyromegaly. Lungs: Clear to auscultation, nonlabored breathing at  rest. Cardiac: Regular rate and rhythm, no S3, soft right basal systolic murmur, no pericardial rub. Abdomen: Soft, nontender, bowel sounds present, no guarding or rebound. Extremities: No pitting edema, distal pulses 2+. Skin: Warm and dry. Musculoskeletal: No kyphosis. Neuropsychiatric: Alert and oriented x3, affect grossly appropriate.  ECG: I personally reviewed the prior tracing from 05/20/2014 which showed sinus rhythm with nonspecific ST-T changes.  Recent Labwork:  January 2015: Hemoglobin 10.2, platelets 261, potassium 3.6, BUN 10, creatinine 0.06 April 2015: Hemoglobin 13.9, platelets 299, BUN 15, creatinine 0.9, potassium 3.5, AST 11, ALT 8, TSH 1.5  Other Studies Reviewed Today:  Chest x-ray 04/24/2015: FINDINGS: Patient is rotated to the right. Mild prominence of the ascending thoracic aorta is noted. An ascending thoracic aortic aneurysm cannot be excluded. Hilar structures are unremarkable. Heart size normal. Nodular densities in the lung bases again noted. these on PA view only consistent nipple shadows. No pulmonary infiltrate. Multiple left-sided rib fractures are present. No pneumothorax.  IMPRESSION: 1. Multiple left-sided rib fractures. Mild mid thoracic spine compression fracture, age undetermined. No pneumothorax.  2. Mild prominence of the ascending aorta. Ascending thoracic aortic aneurysm cannot be excluded.  Echocardiogram 04/02/2013: Study Conclusions  - Left ventricle: The cavity size was normal. Wall thickness was increased in a pattern of mild to moderate LVH. Systolic function was normal. The estimated ejection fraction was in the range of 60% to 65%. Wall motion was normal; there were no regional wall motion abnormalities. Doppler parameters are consistent with abnormal left ventricular relaxation (grade 1 diastolic dysfunction). - Aortic valve: Mildly calcified annulus. Trileaflet. No significant regurgitation. Mean gradient:  21mm Hg (S). - Mitral valve: Calcified annulus. Trivial regurgitation. - Right atrium: Central venous pressure: 39mm Hg (est). - Tricuspid valve: Trivial regurgitation. - Pulmonary arteries: Systolic pressure could not be accurately estimated. - Pericardium, extracardiac: A prominent pericardial fat pad was present. A small pericardial effusion was identified anterior to the heart. No evidence of tamponade. Impressions:  - No prior study for comparison. Mild to moderate LVH with LVEF [id number], grade 1 diastolic dysfunction. Trivial mitral and tricuspid regurgitation. Unable to assess PASP. Small anterior pericardial effusion - no obvious hemodynamic significance at this point.  Assessment and Plan:  1. Abnormal chest x-ray identifying prominence of the thoracic aorta, unable to exclude aneurysmal change. She is not clearly symptomatic, and has no history of PAD or aneurysmal disease. We will clarify this finding further with a chest CTA.  2. Essential hypertension, blood pressure is normal today on lisinopril and metoprolol.  3. Chronically abnormal ECG. No angina symptoms. Cardiolite from 2016 was low risk without active ischemia and normal LVEF.  4. Recent rib fractures following mechanical fall. Patient states she is doing well with no residual pain at this time. She indicates that the fall was related to tripping, no loss  of consciousness or palpitations.  5. Type 2 diabetes mellitus, on Glucophage. She continues to follow with Dr. Terance Hart.  Current medicines were reviewed with the patient today.   Orders Placed This Encounter  Procedures  . CT Angio Chest W/Cm &/Or Wo Cm  . EKG 12-Lead    Disposition: Call with results.   Signed, Satira Sark, MD, [facility] 05/19/2015 12:02 PM    [facility] [place]. [address], [place], [place] [address] Phone: [phone]; Fax: [phone]

## 2015-05-24 ENCOUNTER — Other Ambulatory Visit (HOSPITAL_COMMUNITY): Payer: Self-pay | Admitting: Family Medicine

## 2015-05-24 DIAGNOSIS — E559 Vitamin D deficiency, unspecified: Secondary | ICD-10-CM

## 2015-05-24 DIAGNOSIS — M810 Age-related osteoporosis without current pathological fracture: Secondary | ICD-10-CM

## 2015-05-24 DIAGNOSIS — E2839 Other primary ovarian failure: Secondary | ICD-10-CM

## 2015-05-24 DIAGNOSIS — Z780 Asymptomatic menopausal state: Secondary | ICD-10-CM

## 2015-05-25 ENCOUNTER — Telehealth: Payer: Self-pay

## 2015-05-25 ENCOUNTER — Ambulatory Visit (HOSPITAL_COMMUNITY)
Admission: RE | Admit: 2015-05-25 | Discharge: 2015-05-25 | Disposition: A | Discharge status: Home / Self Care | Payer: Medicaid Other | Source: Ambulatory Visit | Attending: Cardiology | Admitting: Cardiology

## 2015-05-25 DIAGNOSIS — J984 Other disorders of lung: Secondary | ICD-10-CM | POA: Insufficient documentation

## 2015-05-25 DIAGNOSIS — R938 Abnormal findings on diagnostic imaging of other specified body structures: Secondary | ICD-10-CM | POA: Diagnosis not present

## 2015-05-25 DIAGNOSIS — I77810 Thoracic aortic ectasia: Secondary | ICD-10-CM | POA: Diagnosis not present

## 2015-05-25 DIAGNOSIS — R222 Localized swelling, mass and lump, trunk: Secondary | ICD-10-CM | POA: Diagnosis not present

## 2015-05-25 DIAGNOSIS — I2510 Atherosclerotic heart disease of native coronary artery without angina pectoris: Secondary | ICD-10-CM | POA: Insufficient documentation

## 2015-05-25 DIAGNOSIS — I700 Atherosclerosis of aorta: Secondary | ICD-10-CM | POA: Insufficient documentation

## 2015-05-25 DIAGNOSIS — I773 Arterial fibromuscular dysplasia: Secondary | ICD-10-CM | POA: Insufficient documentation

## 2015-05-25 DIAGNOSIS — R9389 Abnormal findings on diagnostic imaging of other specified body structures: Secondary | ICD-10-CM

## 2015-05-25 DIAGNOSIS — R0989 Other specified symptoms and signs involving the circulatory and respiratory systems: Secondary | ICD-10-CM

## 2015-05-25 LAB — POCT I-STAT CREATININE: Creatinine, Ser: 0.8 mg/dL (ref 0.44–1.00)

## 2015-05-25 MED ORDER — IOHEXOL 350 MG/ML SOLN
100.0000 mL | Freq: Once | INTRAVENOUS | Status: AC | PRN
  Administered 2015-05-25: 100 mL via INTRAVENOUS

## 2015-05-25 NOTE — Telephone Encounter (Signed)
-----   Message from Merlene Laughter, LPN sent at [id number] 12:36 PM EST -----   ----- Message -----    From: Satira Sark, MD    Sent: 05/25/2015  12:35 PM      To: Merlene Laughter, LPN  Results reviewed. The aorta is tortuous and ectatic but there is no definite aneurysm or dissection which was the main question for this study. She does have evidence of atherosclerosis within the aorta and also the coronary arteries, however stress testing from last year did not show any active ischemia, and I would recommend continuing medical therapy and observation at this time.  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 A follow-up CT of the chest was recommended for surveillance in 6 months per radiologist. Please forward copy of this test result with my recommendations to the patient's primary care provider Dr. Terance Hart to arrange this follow-up imaging (and verify receipt). We can see her back in one year's time.

## 2015-05-25 NOTE — Telephone Encounter (Signed)
MD note below faxed to [place] aware

## 2015-05-31 ENCOUNTER — Ambulatory Visit (HOSPITAL_COMMUNITY)
Admission: RE | Admit: 2015-05-31 | Discharge: 2015-05-31 | Disposition: A | Discharge status: Home / Self Care | Payer: Medicaid Other | Source: Ambulatory Visit | Attending: Family Medicine | Admitting: Family Medicine

## 2015-05-31 DIAGNOSIS — E2839 Other primary ovarian failure: Secondary | ICD-10-CM

## 2015-05-31 DIAGNOSIS — Z780 Asymptomatic menopausal state: Secondary | ICD-10-CM | POA: Insufficient documentation

## 2015-05-31 DIAGNOSIS — E559 Vitamin D deficiency, unspecified: Secondary | ICD-10-CM | POA: Insufficient documentation

## 2015-05-31 DIAGNOSIS — M8580 Other specified disorders of bone density and structure, unspecified site: Secondary | ICD-10-CM | POA: Diagnosis not present

## 2015-05-31 DIAGNOSIS — M810 Age-related osteoporosis without current pathological fracture: Secondary | ICD-10-CM

## 2015-10-19 ENCOUNTER — Other Ambulatory Visit (HOSPITAL_COMMUNITY): Payer: Self-pay | Admitting: Internal Medicine

## 2015-10-19 DIAGNOSIS — R918 Other nonspecific abnormal finding of lung field: Secondary | ICD-10-CM

## 2015-10-26 ENCOUNTER — Ambulatory Visit (HOSPITAL_COMMUNITY)
Admission: RE | Admit: 2015-10-26 | Discharge: 2015-10-26 | Disposition: A | Discharge status: Home / Self Care | Payer: Medicaid Other | Source: Ambulatory Visit | Attending: Internal Medicine | Admitting: Internal Medicine

## 2015-10-26 DIAGNOSIS — S2242XA Multiple fractures of ribs, left side, initial encounter for closed fracture: Secondary | ICD-10-CM | POA: Diagnosis not present

## 2015-10-26 DIAGNOSIS — X58XXXA Exposure to other specified factors, initial encounter: Secondary | ICD-10-CM | POA: Insufficient documentation

## 2015-10-26 DIAGNOSIS — R918 Other nonspecific abnormal finding of lung field: Secondary | ICD-10-CM

## 2015-10-26 DIAGNOSIS — J90 Pleural effusion, not elsewhere classified: Secondary | ICD-10-CM | POA: Insufficient documentation

## 2015-10-26 DIAGNOSIS — I700 Atherosclerosis of aorta: Secondary | ICD-10-CM | POA: Insufficient documentation

## 2015-10-26 DIAGNOSIS — I2510 Atherosclerotic heart disease of native coronary artery without angina pectoris: Secondary | ICD-10-CM | POA: Diagnosis not present

## 2015-10-26 LAB — POCT I-STAT CREATININE: Creatinine, Ser: 0.9 mg/dL (ref 0.44–1.00)

## 2015-10-26 MED ORDER — IOPAMIDOL (ISOVUE-300) INJECTION 61%
100.0000 mL | Freq: Once | INTRAVENOUS | Status: AC | PRN
  Administered 2015-10-26: 100 mL via INTRAVENOUS

## 2016-06-12 ENCOUNTER — Encounter (HOSPITAL_COMMUNITY): Payer: Self-pay | Admitting: Emergency Medicine

## 2016-06-12 ENCOUNTER — Inpatient Hospital Stay (HOSPITAL_COMMUNITY)
Admission: EM | Admit: 2016-06-12 | Discharge: 2016-06-14 | DRG: 190 | Disposition: A | Discharge status: Home / Self Care | Payer: Medicaid Other | Attending: Family Medicine | Admitting: Family Medicine

## 2016-06-12 ENCOUNTER — Emergency Department (HOSPITAL_COMMUNITY): Payer: Medicaid Other

## 2016-06-12 DIAGNOSIS — Z23 Encounter for immunization: Secondary | ICD-10-CM

## 2016-06-12 DIAGNOSIS — F17210 Nicotine dependence, cigarettes, uncomplicated: Secondary | ICD-10-CM | POA: Diagnosis present

## 2016-06-12 DIAGNOSIS — E119 Type 2 diabetes mellitus without complications: Secondary | ICD-10-CM

## 2016-06-12 DIAGNOSIS — F329 Major depressive disorder, single episode, unspecified: Secondary | ICD-10-CM | POA: Diagnosis present

## 2016-06-12 DIAGNOSIS — I4581 Long QT syndrome: Secondary | ICD-10-CM | POA: Diagnosis present

## 2016-06-12 DIAGNOSIS — Z8249 Family history of ischemic heart disease and other diseases of the circulatory system: Secondary | ICD-10-CM

## 2016-06-12 DIAGNOSIS — J441 Chronic obstructive pulmonary disease with (acute) exacerbation: Secondary | ICD-10-CM | POA: Diagnosis present

## 2016-06-12 DIAGNOSIS — E1165 Type 2 diabetes mellitus with hyperglycemia: Secondary | ICD-10-CM | POA: Diagnosis present

## 2016-06-12 DIAGNOSIS — Z720 Tobacco use: Secondary | ICD-10-CM | POA: Diagnosis not present

## 2016-06-12 DIAGNOSIS — I248 Other forms of acute ischemic heart disease: Secondary | ICD-10-CM | POA: Diagnosis present

## 2016-06-12 DIAGNOSIS — Z79899 Other long term (current) drug therapy: Secondary | ICD-10-CM | POA: Diagnosis not present

## 2016-06-12 DIAGNOSIS — E871 Hypo-osmolality and hyponatremia: Secondary | ICD-10-CM | POA: Diagnosis present

## 2016-06-12 DIAGNOSIS — I10 Essential (primary) hypertension: Secondary | ICD-10-CM | POA: Diagnosis present

## 2016-06-12 DIAGNOSIS — R42 Dizziness and giddiness: Secondary | ICD-10-CM | POA: Diagnosis present

## 2016-06-12 DIAGNOSIS — M1990 Unspecified osteoarthritis, unspecified site: Secondary | ICD-10-CM | POA: Diagnosis present

## 2016-06-12 DIAGNOSIS — E876 Hypokalemia: Secondary | ICD-10-CM | POA: Diagnosis not present

## 2016-06-12 DIAGNOSIS — J9620 Acute and chronic respiratory failure, unspecified whether with hypoxia or hypercapnia: Secondary | ICD-10-CM | POA: Diagnosis present

## 2016-06-12 DIAGNOSIS — J9621 Acute and chronic respiratory failure with hypoxia: Secondary | ICD-10-CM | POA: Diagnosis not present

## 2016-06-12 DIAGNOSIS — Z7984 Long term (current) use of oral hypoglycemic drugs: Secondary | ICD-10-CM

## 2016-06-12 DIAGNOSIS — R7989 Other specified abnormal findings of blood chemistry: Secondary | ICD-10-CM | POA: Diagnosis present

## 2016-06-12 DIAGNOSIS — R778 Other specified abnormalities of plasma proteins: Secondary | ICD-10-CM | POA: Diagnosis present

## 2016-06-12 DIAGNOSIS — N179 Acute kidney failure, unspecified: Secondary | ICD-10-CM | POA: Diagnosis present

## 2016-06-12 DIAGNOSIS — Z833 Family history of diabetes mellitus: Secondary | ICD-10-CM

## 2016-06-12 DIAGNOSIS — J449 Chronic obstructive pulmonary disease, unspecified: Secondary | ICD-10-CM | POA: Diagnosis present

## 2016-06-12 DIAGNOSIS — R748 Abnormal levels of other serum enzymes: Secondary | ICD-10-CM | POA: Diagnosis not present

## 2016-06-12 LAB — DIFFERENTIAL
Basophils Absolute: 0 10*3/uL (ref 0.0–0.1)
Basophils Relative: 0 %
EOS ABS: 0 10*3/uL (ref 0.0–0.7)
EOS PCT: 0 %
Lymphocytes Relative: 30 %
Lymphs Abs: 3.4 10*3/uL (ref 0.7–4.0)
MONOS PCT: 12 %
Monocytes Absolute: 1.4 10*3/uL — ABNORMAL HIGH (ref 0.1–1.0)
NEUTROS PCT: 58 %
Neutro Abs: 6.5 10*3/uL (ref 1.7–7.7)

## 2016-06-12 LAB — BLOOD GAS, VENOUS
ACID-BASE EXCESS: 8.7 mmol/L — AB (ref 0.0–2.0)
Bicarbonate: 30.9 mmol/L — ABNORMAL HIGH (ref 20.0–28.0)
FIO2: 0.21
O2 SAT: 69.8 %
PCO2 VEN: 51.1 mmHg (ref 44.0–60.0)
PH VEN: 7.429 (ref 7.250–7.430)
PO2 VEN: 37.4 mmHg (ref 32.0–45.0)
Patient temperature: 37

## 2016-06-12 LAB — COMPREHENSIVE METABOLIC PANEL
ALK PHOS: 55 U/L (ref 38–126)
ALT: 15 U/L (ref 14–54)
AST: 28 U/L (ref 15–41)
Albumin: 3.4 g/dL — ABNORMAL LOW (ref 3.5–5.0)
Anion gap: 10 (ref 5–15)
BUN: 19 mg/dL (ref 6–20)
CALCIUM: 8.4 mg/dL — AB (ref 8.9–10.3)
CHLORIDE: 88 mmol/L — AB (ref 101–111)
CO2: 32 mmol/L (ref 22–32)
CREATININE: 1.17 mg/dL — AB (ref 0.44–1.00)
GFR calc Af Amer: 56 mL/min — ABNORMAL LOW (ref >60)
GFR calc non Af Amer: 49 mL/min — ABNORMAL LOW (ref >60)
Glucose, Bld: 199 mg/dL — ABNORMAL HIGH (ref 65–99)
Potassium: 2.3 mmol/L — CL (ref 3.5–5.1)
SODIUM: 130 mmol/L — AB (ref 135–145)
Total Bilirubin: 0.6 mg/dL (ref 0.3–1.2)
Total Protein: 7.1 g/dL (ref 6.5–8.1)

## 2016-06-12 LAB — CBC
HCT: 39.4 % (ref 36.0–46.0)
Hemoglobin: 13.7 g/dL (ref 12.0–15.0)
MCH: 32.9 pg (ref 26.0–34.0)
MCHC: 34.8 g/dL (ref 30.0–36.0)
MCV: 94.5 fL (ref 78.0–100.0)
PLATELETS: 186 10*3/uL (ref 150–400)
RBC: 4.17 MIL/uL (ref 3.87–5.11)
RDW: 14.7 % (ref 11.5–15.5)
WBC: 11.4 10*3/uL — ABNORMAL HIGH (ref 4.0–10.5)

## 2016-06-12 LAB — TROPONIN I: Troponin I: 0.06 ng/mL (ref <0.03)

## 2016-06-12 LAB — GLUCOSE, CAPILLARY: Glucose-Capillary: 227 mg/dL — ABNORMAL HIGH (ref 65–99)

## 2016-06-12 LAB — MAGNESIUM: MAGNESIUM: 2 mg/dL (ref 1.7–2.4)

## 2016-06-12 MED ORDER — ONDANSETRON HCL 4 MG PO TABS: 4.0000 mg | ORAL_TABLET | Freq: Four times a day (QID) | ORAL | Status: DC | PRN

## 2016-06-12 MED ORDER — INFLUENZA VAC SPLIT QUAD 0.5 ML IM SUSY
0.5000 mL | PREFILLED_SYRINGE | INTRAMUSCULAR | Status: AC
  Administered 2016-06-13: 0.5 mL via INTRAMUSCULAR
  Filled 2016-06-12: qty 0.5

## 2016-06-12 MED ORDER — ENOXAPARIN SODIUM 40 MG/0.4ML ~~LOC~~ SOLN
40.0000 mg | SUBCUTANEOUS | Status: DC
  Administered 2016-06-13 (×2): 40 mg via SUBCUTANEOUS
  Filled 2016-06-12 (×2): qty 0.4

## 2016-06-12 MED ORDER — IPRATROPIUM-ALBUTEROL 0.5-2.5 (3) MG/3ML IN SOLN
3.0000 mL | RESPIRATORY_TRACT | Status: AC
  Administered 2016-06-12 (×3): 3 mL via RESPIRATORY_TRACT
  Filled 2016-06-12 (×3): qty 3

## 2016-06-12 MED ORDER — ONDANSETRON HCL 4 MG/2ML IJ SOLN: 4.0000 mg | Freq: Four times a day (QID) | INTRAMUSCULAR | Status: DC | PRN

## 2016-06-12 MED ORDER — OXYBUTYNIN CHLORIDE 5 MG PO TABS
5.0000 mg | ORAL_TABLET | Freq: Two times a day (BID) | ORAL | Status: DC
  Administered 2016-06-13 – 2016-06-14 (×4): 5 mg via ORAL
  Filled 2016-06-12 (×4): qty 1

## 2016-06-12 MED ORDER — INSULIN ASPART 100 UNIT/ML ~~LOC~~ SOLN
0.0000 [IU] | Freq: Every day | SUBCUTANEOUS | Status: DC
  Administered 2016-06-13 (×2): 2 [IU] via SUBCUTANEOUS

## 2016-06-12 MED ORDER — METOPROLOL SUCCINATE ER 25 MG PO TB24
25.0000 mg | ORAL_TABLET | Freq: Every day | ORAL | Status: DC
  Administered 2016-06-13 – 2016-06-14 (×2): 25 mg via ORAL
  Filled 2016-06-12 (×2): qty 1

## 2016-06-12 MED ORDER — GABAPENTIN 300 MG PO CAPS
600.0000 mg | ORAL_CAPSULE | Freq: Every day | ORAL | Status: DC
  Administered 2016-06-13 (×2): 600 mg via ORAL
  Filled 2016-06-12 (×2): qty 2

## 2016-06-12 MED ORDER — METHYLPREDNISOLONE SODIUM SUCC 125 MG IJ SOLR
125.0000 mg | Freq: Once | INTRAMUSCULAR | Status: AC
  Administered 2016-06-12: 125 mg via INTRAVENOUS
  Filled 2016-06-12: qty 2

## 2016-06-12 MED ORDER — METHYLPREDNISOLONE SODIUM SUCC 125 MG IJ SOLR
60.0000 mg | Freq: Two times a day (BID) | INTRAMUSCULAR | Status: DC
  Administered 2016-06-13 (×2): 60 mg via INTRAVENOUS
  Filled 2016-06-12 (×2): qty 2

## 2016-06-12 MED ORDER — ALBUTEROL SULFATE (2.5 MG/3ML) 0.083% IN NEBU: 2.5000 mg | INHALATION_SOLUTION | RESPIRATORY_TRACT | Status: DC | PRN

## 2016-06-12 MED ORDER — INSULIN ASPART 100 UNIT/ML ~~LOC~~ SOLN
0.0000 [IU] | Freq: Three times a day (TID) | SUBCUTANEOUS | Status: DC
  Administered 2016-06-13: 3 [IU] via SUBCUTANEOUS
  Administered 2016-06-13: 8 [IU] via SUBCUTANEOUS
  Administered 2016-06-14: 3 [IU] via SUBCUTANEOUS

## 2016-06-12 MED ORDER — IPRATROPIUM-ALBUTEROL 0.5-2.5 (3) MG/3ML IN SOLN
3.0000 mL | Freq: Four times a day (QID) | RESPIRATORY_TRACT | Status: DC
  Administered 2016-06-13 – 2016-06-14 (×6): 3 mL via RESPIRATORY_TRACT
  Filled 2016-06-12 (×6): qty 3

## 2016-06-12 MED ORDER — DOXYCYCLINE HYCLATE 100 MG IV SOLR
100.0000 mg | Freq: Two times a day (BID) | INTRAVENOUS | Status: DC
  Administered 2016-06-13 (×2): 100 mg via INTRAVENOUS
  Filled 2016-06-12 (×9): qty 100

## 2016-06-12 MED ORDER — POTASSIUM CHLORIDE CRYS ER 20 MEQ PO TBCR
40.0000 meq | EXTENDED_RELEASE_TABLET | Freq: Once | ORAL | Status: AC
  Administered 2016-06-12: 40 meq via ORAL
  Filled 2016-06-12: qty 2

## 2016-06-12 MED ORDER — ACETAMINOPHEN 325 MG PO TABS: 650.0000 mg | ORAL_TABLET | Freq: Four times a day (QID) | ORAL | Status: DC | PRN

## 2016-06-12 MED ORDER — SODIUM CHLORIDE 0.9% FLUSH
3.0000 mL | Freq: Two times a day (BID) | INTRAVENOUS | Status: DC
  Administered 2016-06-13 – 2016-06-14 (×4): 3 mL via INTRAVENOUS

## 2016-06-12 MED ORDER — NICOTINE 14 MG/24HR TD PT24
14.0000 mg | MEDICATED_PATCH | Freq: Every day | TRANSDERMAL | Status: DC
  Administered 2016-06-13 – 2016-06-14 (×2): 14 mg via TRANSDERMAL
  Filled 2016-06-12 (×2): qty 1

## 2016-06-12 MED ORDER — ACETAMINOPHEN 650 MG RE SUPP: 650.0000 mg | Freq: Four times a day (QID) | RECTAL | Status: DC | PRN

## 2016-06-12 MED ORDER — PRAVASTATIN SODIUM 40 MG PO TABS
40.0000 mg | ORAL_TABLET | Freq: Every day | ORAL | Status: DC
  Administered 2016-06-13: 40 mg via ORAL
  Filled 2016-06-12 (×2): qty 1

## 2016-06-12 MED ORDER — CITALOPRAM HYDROBROMIDE 20 MG PO TABS
40.0000 mg | ORAL_TABLET | Freq: Every day | ORAL | Status: DC
  Administered 2016-06-13 – 2016-06-14 (×2): 40 mg via ORAL
  Filled 2016-06-12 (×2): qty 2

## 2016-06-12 MED ORDER — PNEUMOCOCCAL VAC POLYVALENT 25 MCG/0.5ML IJ INJ
0.5000 mL | INJECTION | INTRAMUSCULAR | Status: AC
  Administered 2016-06-13: 0.5 mL via INTRAMUSCULAR
  Filled 2016-06-12: qty 0.5

## 2016-06-12 MED ORDER — POTASSIUM CHLORIDE 10 MEQ/100ML IV SOLN
10.0000 meq | INTRAVENOUS | Status: AC
  Administered 2016-06-12 (×2): 10 meq via INTRAVENOUS
  Filled 2016-06-12 (×2): qty 100

## 2016-06-12 MED ORDER — POTASSIUM CHLORIDE IN NACL 40-0.9 MEQ/L-% IV SOLN
INTRAVENOUS | Status: AC
  Administered 2016-06-12 – 2016-06-13 (×2): 100 mL/h via INTRAVENOUS

## 2016-06-12 NOTE — ED Provider Notes (Signed)
[facility] DEPT Provider Note   CSN: [id number] Arrival date & time: 06/12/16  1758     History   Chief Complaint Chief Complaint  Patient presents with  . Shortness of Breath    HPI DESHONDA CRYDERMAN is a 66 y.o. female.  HPI 66 year old female who presents with shortness of breath. She has history of COPD, chronic tobacco use, DM, HTN. Onset of progressively worsening DOE to now at rest over past 3 days. Associated with cough and chest tightness and chest pain w/ movement and coughing. Vomiting clear spit with coughing. No diarrhea, abdominal pain, LE edema, calf tenderness. Subjective fever and chills. No known sick contacts. Feeling dizzy when standing up. Feeling more tired and sleepy Past Medical History:  Diagnosis Date  . Arthritis   . Depression   . Essential hypertension, benign   . Type 2 diabetes mellitus ([place])   . Urinary incontinence     Patient Active Problem List   Diagnosis Date Noted  . Acute on chronic respiratory failure ([place]) 06/12/2016  . Preoperative cardiovascular examination 05/18/2014  . Abnormal ECG 05/18/2014  . Humerus shaft fracture 04/06/2013  . Closed fracture of unspecified part of upper end of humerus 04/06/2013  . Syncope 03/31/2013  . Fracture of left humerus 03/31/2013  . DM type 2 (diabetes mellitus, type 2) ([place]) 03/31/2013  . HTN (hypertension) 03/31/2013  . Heme positive stool 03/31/2013  . Normocytic anemia 03/31/2013  . Tobacco abuse 03/31/2013    Past Surgical History:  Procedure Laterality Date  . CHOLECYSTECTOMY      OB History    No data available       Home Medications    Prior to Admission medications   Medication Sig Start Date End Date Taking? Authorizing Provider  albuterol (PROVENTIL HFA;VENTOLIN HFA) 108 (90 BASE) MCG/ACT inhaler Inhale 2 puffs into the lungs every 6 (six) hours as needed for wheezing.   Yes Historical Provider, MD  citalopram (CELEXA) 40 MG tablet Take 40 mg by mouth daily.   Yes  Historical Provider, MD  gabapentin (NEURONTIN) 300 MG capsule Take 600 mg by mouth at bedtime.    Yes Historical Provider, MD  hydrochlorothiazide (HYDRODIURIL) 25 MG tablet Take 25 mg by mouth daily.    Historical Provider, MD  lisinopril (PRINIVIL,ZESTRIL) 40 MG tablet Take 40 mg by mouth daily.    Historical Provider, MD  lovastatin (MEVACOR) 40 MG tablet Take 40 mg by mouth at bedtime.    Historical Provider, MD  metFORMIN (GLUCOPHAGE) 1000 MG tablet Take 1,000 mg by mouth 2 (two) times daily with a meal.    Historical Provider, MD  metoprolol (LOPRESSOR) 100 MG tablet Take 100 mg by mouth daily.    Historical Provider, MD  metoprolol succinate (TOPROL-XL) 25 MG 24 hr tablet Take 25 mg by mouth daily.    Historical Provider, MD  oxybutynin (DITROPAN) 5 MG tablet Take 5 mg by mouth 2 (two) times daily.     Historical Provider, MD    Family History Family History  Problem Relation Age of Onset  . Diabetes Mellitus II Sister   . Diabetes Mellitus II Mother   . Hypertension Brother   . Hypertension Sister     Social History Social History  Substance Use Topics  . Smoking status: Current Every Day Smoker    Packs/day: 1.00    Years: 43.00    Types: Cigarettes    Start date: 01/09/1971  . Smokeless tobacco: Never Used  . Alcohol  use No     Allergies   Patient has no known allergies.   Review of Systems Review of Systems 10/14 systems reviewed and are negative other than those stated in the HPI   Physical Exam Updated Vital Signs BP 142/81   Pulse 67   Temp 98 F (36.7 C) (Oral)   Resp 15   Ht 5\' 6"  (1.676 m)   Wt 149 lb (67.6 kg)   SpO2 91%   BMI 24.05 kg/m   Physical Exam Physical Exam  Nursing note and vitals reviewed. Constitutional: Listless, somnolent but arouses to voice and answers questions appropriately, non-toxic, and in no acute distress Head: Normocephalic and atraumatic.  Mouth/Throat: Oropharynx is clear and moist.  Neck: Normal range of  motion. Neck supple.  Cardiovascular: Normal rate and regular rhythm.   Pulmonary/Chest: Effort normal. Crackles at the lung base. Poor air movement. With scattered wheeze  Abdominal: Soft. There is no tenderness. There is no rebound and no guarding.  Musculoskeletal: Normal range of motion. no edema. Neurological: Alert, no facial droop, fluent speech, moves all extremities symmetrically Skin: Skin is warm and dry.  Psychiatric: Cooperative   ED Treatments / Results  Labs (all labs ordered are listed, but only abnormal results are displayed) Labs Reviewed  CBC - Abnormal; Notable for the following:       Result Value   WBC 11.4 (*)    All other components within normal limits  COMPREHENSIVE METABOLIC PANEL - Abnormal; Notable for the following:    Sodium 130 (*)    Potassium 2.3 (*)    Chloride 88 (*)    Glucose, Bld 199 (*)    Creatinine, Ser 1.17 (*)    Calcium 8.4 (*)    Albumin 3.4 (*)    GFR calc non Af Amer 49 (*)    GFR calc Af Amer 56 (*)    All other components within normal limits  TROPONIN I - Abnormal; Notable for the following:    Troponin I 0.06 (*)    All other components within normal limits  BLOOD GAS, VENOUS - Abnormal; Notable for the following:    Bicarbonate 30.9 (*)    Acid-Base Excess 8.7 (*)    All other components within normal limits  DIFFERENTIAL - Abnormal; Notable for the following:    Monocytes Absolute 1.4 (*)    All other components within normal limits  MAGNESIUM    EKG  EKG Interpretation None       Radiology Dg Chest 2 View  Result Date: 06/12/2016 CLINICAL DATA:  Chest pain with productive cough and shortness of breath EXAM: CHEST  2 VIEW COMPARISON:  10/26/2015, 04/24/2015 FINDINGS: Hyperinflation is present. No pleural effusion or acute consolidation. Borderline cardiomegaly with mild central congestion. Atherosclerosis. No pneumothorax. Old left rib deformities. Stable mild wedging of mid thoracic vertebra. IMPRESSION: 1.  Hyperinflation without acute infiltrate or effusion 2. Borderline cardiomegaly with mild central congestion Electronically Signed   By: Donavan Foil M.D.   On: 06/12/2016 18:34    Procedures Procedures (including critical care time) CRITICAL CARE Performed by: Forde Dandy   Total critical care time: 35 minutes  Critical care time was exclusive of separately billable procedures and treating other patients.  Critical care was necessary to treat or prevent imminent or life-threatening deterioration.  Critical care was time spent personally by me on the following activities: development of treatment plan with patient and/or surrogate as well as nursing, evaluation of patient's response to treatment, examination  of patient, obtaining history from patient or surrogate, ordering and performing treatments and interventions, ordering and review of laboratory studies, ordering and review of radiographic studies, pulse oximetry and re-evaluation of patient's condition.  Medications Ordered in ED Medications  potassium chloride 10 mEq in 100 mL IVPB (10 mEq Intravenous New Bag/Given 06/12/16 2032)  ipratropium-albuterol (DUONEB) 0.5-2.5 (3) MG/3ML nebulizer solution 3 mL (3 mLs Nebulization Given 06/12/16 2021)  methylPREDNISolone sodium succinate (SOLU-MEDROL) 125 mg/2 mL injection 125 mg (125 mg Intravenous Given 06/12/16 1851)  potassium chloride SA (K-DUR,KLOR-CON) CR tablet 40 mEq (40 mEq Oral Given 06/12/16 1939)     Initial Impression / Assessment and Plan / ED Course  I have reviewed the triage vital signs and the nursing notes.  Pertinent labs & imaging results that were available during my care of the patient were reviewed by me and considered in my medical decision making (see chart for details).     With shortness of breath, cough, MSK chest pain over past few days. Tachypnea on arrival, with low 90% saturations, placed on oxygen for comfort. Faint wheezing on exam. Concern for COPD  exacerbation. duoneb x 3, solumedrol given with some improvement, but persistent wheezing and dyspnea.  With critical potassium of 2.3. Borderline QTc prolongation noted on EKG. repleted with 60 mEQ oral potassium and 20 IV potassium, and will need further repletion. Troponin elevation of 0.06, likely more from demand. No ischemia on EKG. Magnesium pending.   Requiring admission for ongoing management of COPD and hypokalemia as well as cardiac evaluation.  Final Clinical Impressions(s) / ED Diagnoses   Final diagnoses:  Acute exacerbation of chronic obstructive pulmonary disease (COPD) ([place])  Hypokalemia  Elevated troponin    New Prescriptions New Prescriptions   No medications on file     Forde Dandy, MD 06/12/16 2048

## 2016-06-12 NOTE — ED Notes (Signed)
CRITICAL VALUE ALERT  Critical value received:  Trop 0.06, K 2.3  Date of notification:  06/12/16  Time of notification:  1922  Critical value read back:Yes.    Nurse who received alert:  B. Olena Heckle, RN  MD notified (1st page):  Oleta Mouse  Time of first page:  1922

## 2016-06-12 NOTE — ED Notes (Signed)
Unable to scan med due to registration in room and on computer

## 2016-06-12 NOTE — ED Triage Notes (Signed)
Pt reports productive cough with shortness of breath and pain with coughing.  Began 3 days ago.

## 2016-06-12 NOTE — H&P (Signed)
History and Physical    Holly May QPR:[id number] DOB: 1950-08-21 DOA: 06/12/2016  PCP: [facility] Consultants:  None Patient coming from: Home - lives with husband and son; [place]: sister or son; [phone]  Chief Complaint: SOB  HPI: Holly May is a 66 y.o. female with medical history significant of COPD not on home O2, chronic tobacco use, DM, and HTN presenting with breathing hard and feels like something in her chest is pulling.  Breathing has been acting up, but the last 3-4 days she has really gotten worse.  Hard to move because SOB.  Some productive sputum.  Left-sided chest pain.  Denies h/o heart disease.  +fevers, hot/cold sweats.  Has been taking 2 ?Neurontin at night.   Does not wear O2 at home.     ED Course:  O2 for comfort.  Duoneb and Solumedrol with some improvement but ongoing wheezing and dyspnea.  Critical potassium of 2.3, borderline QTc prolongation, given 60 mEq PO and 20 mEq IV KCl (would be expected to raise K+ to 3.1).  Chest mag.  Troponin 0.06, likely demand ischemia.  Review of Systems: As per HPI; otherwise 10 point review of systems reviewed and negative.   Ambulatory Status:  Sometimes she needs a cane  Past Medical History:  Diagnosis Date  . Arthritis   . Depression   . Essential hypertension, benign   . Type 2 diabetes mellitus ([place])   . Urinary incontinence     Past Surgical History:  Procedure Laterality Date  . CHOLECYSTECTOMY      Social History   Social History  . Marital status: Married    Spouse name: N/A  . Number of children: N/A  . Years of education: N/A   Occupational History  . retired    Social History Main Topics  . Smoking status: Current Every Day Smoker    Packs/day: 1.00    Years: 43.00    Types: Cigarettes    Start date: 01/09/1971  . Smokeless tobacco: Never Used  . Alcohol use 0.0 oz/week     Comment: sometimes  . Drug use: No  . Sexual activity: Not on file   Other  Topics Concern  . Not on file   Social History Narrative  . No narrative on file    No Known Allergies  Family History  Problem Relation Age of Onset  . Diabetes Mellitus II Sister   . Diabetes Mellitus II Mother   . Hypertension Brother   . Hypertension Sister     Prior to Admission medications   Medication Sig Start Date End Date Taking? Authorizing Provider  albuterol (PROVENTIL HFA;VENTOLIN HFA) 108 (90 BASE) MCG/ACT inhaler Inhale 2 puffs into the lungs every 6 (six) hours as needed for wheezing.   Yes Historical Provider, MD  citalopram (CELEXA) 40 MG tablet Take 40 mg by mouth daily.   Yes Historical Provider, MD  gabapentin (NEURONTIN) 300 MG capsule Take 600 mg by mouth at bedtime.    Yes Historical Provider, MD  hydrochlorothiazide (HYDRODIURIL) 25 MG tablet Take 25 mg by mouth daily.    Historical Provider, MD  lisinopril (PRINIVIL,ZESTRIL) 40 MG tablet Take 40 mg by mouth daily.    Historical Provider, MD  lovastatin (MEVACOR) 40 MG tablet Take 40 mg by mouth at bedtime.    Historical Provider, MD  metFORMIN (GLUCOPHAGE) 1000 MG tablet Take 1,000 mg by mouth 2 (two) times daily with a meal.    Historical Provider, MD  metoprolol (LOPRESSOR) 100 MG tablet Take 100 mg by mouth daily.    Historical Provider, MD  metoprolol succinate (TOPROL-XL) 25 MG 24 hr tablet Take 25 mg by mouth daily.    Historical Provider, MD  oxybutynin (DITROPAN) 5 MG tablet Take 5 mg by mouth 2 (two) times daily.     Historical Provider, MD    Physical Exam: Vitals:   06/12/16 2115 06/12/16 2130 06/12/16 2200 06/12/16 2215  BP:  133/77 148/88   Pulse: 73 73  75  Resp: 14 17 19 18   Temp:      TempSrc:      SpO2: 94% 93%  92%  Weight:      Height:         General: Appears calm and comfortable and is NAD Eyes:  PERRL, EOMI, normal lids, iris ENT:  grossly normal hearing, lips & tongue, mmm Neck:  no LAD, masses or thyromegaly Cardiovascular:  RRR, no m/r/g. No LE edema.  Respiratory:   Course breath sounds with scattered wheezes, increased WOB Abdomen:  soft, ntnd, NABS Skin:  no rash or induration seen on limited exam Musculoskeletal:  grossly normal tone BUE/BLE, good ROM, no bony abnormality Psychiatric:  grossly normal mood and affect, speech fluent and appropriate, AOx3 Neurologic:  CN 2-12 grossly intact, moves all extremities in coordinated fashion, sensation intact  Labs on Admission: I have personally reviewed following labs and imaging studies  CBC:  Recent Labs Lab 06/12/16 1808  WBC 11.4*  NEUTROABS 6.5  HGB 13.7  HCT 39.4  MCV 94.5  PLT 412   Basic Metabolic Panel:  Recent Labs Lab 06/12/16 1808  NA 130*  K 2.3*  CL 88*  CO2 32  GLUCOSE 199*  BUN 19  CREATININE 1.17*  CALCIUM 8.4*  MG 2.0   GFR: Estimated Creatinine Clearance: 46.1 mL/min (by C-G formula based on SCr of 1.17 mg/dL (H)). Liver Function Tests:  Recent Labs Lab 06/12/16 1808  AST 28  ALT 15  ALKPHOS 55  BILITOT 0.6  PROT 7.1  ALBUMIN 3.4*   No results for input(s): LIPASE, AMYLASE in the last 168 hours. No results for input(s): AMMONIA in the last 168 hours. Coagulation Profile: No results for input(s): INR, PROTIME in the last 168 hours. Cardiac Enzymes:  Recent Labs Lab 06/12/16 1808  TROPONINI 0.06*   BNP (last 3 results) No results for input(s): PROBNP in the last 8760 hours. HbA1C: No results for input(s): HGBA1C in the last 72 hours. CBG: No results for input(s): GLUCAP in the last 168 hours. Lipid Profile: No results for input(s): CHOL, HDL, LDLCALC, TRIG, CHOLHDL, LDLDIRECT in the last 72 hours. Thyroid Function Tests: No results for input(s): TSH, T4TOTAL, FREET4, T3FREE, THYROIDAB in the last 72 hours. Anemia Panel: No results for input(s): VITAMINB12, FOLATE, FERRITIN, TIBC, IRON, RETICCTPCT in the last 72 hours. Urine analysis:    Component Value Date/Time   COLORURINE YELLOW 03/31/2013 [place] 03/31/2013 0454    LABSPEC 1.025 03/31/2013 0454   PHURINE 6.0 03/31/2013 0454   GLUCOSEU NEGATIVE 03/31/2013 0454   HGBUR LARGE (A) 03/31/2013 0454   BILIRUBINUR SMALL (A) 03/31/2013 0454   KETONESUR TRACE (A) 03/31/2013 0454   PROTEINUR 100 (A) 03/31/2013 0454   UROBILINOGEN 4.0 (H) 03/31/2013 0454   NITRITE NEGATIVE 03/31/2013 0454   LEUKOCYTESUR TRACE (A) 03/31/2013 0454    Creatinine Clearance: Estimated Creatinine Clearance: 46.1 mL/min (by C-G formula based on SCr of 1.17 mg/dL (H)).  Sepsis Labs: @LABRCNTIP (procalcitonin:4,lacticidven:4) )  No results found for this or any previous visit (from the past 240 hour(s)).   Radiological Exams on Admission: Dg Chest 2 View  Result Date: 06/12/2016 CLINICAL DATA:  Chest pain with productive cough and shortness of breath EXAM: CHEST  2 VIEW COMPARISON:  10/26/2015, 04/24/2015 FINDINGS: Hyperinflation is present. No pleural effusion or acute consolidation. Borderline cardiomegaly with mild central congestion. Atherosclerosis. No pneumothorax. Old left rib deformities. Stable mild wedging of mid thoracic vertebra. IMPRESSION: 1. Hyperinflation without acute infiltrate or effusion 2. Borderline cardiomegaly with mild central congestion Electronically Signed   By: Donavan Foil M.D.   On: 06/12/2016 18:34    EKG: Independently reviewed.  NSR with rate 67; nonspecific ST changes with no evidence of acute ischemia, prolonged QT 516  Assessment/Plan Principal Problem:   Acute on chronic respiratory failure (HCC) Active Problems:   DM type 2 (diabetes mellitus, type 2) (HCC)   HTN (hypertension)   Tobacco abuse   COPD with acute exacerbation ([facility])   Elevated troponin   Hypokalemia   Acute on chronic respiratory failure from COPD exacerbation -Does not appear to have CAP -Instead this appears to be chronic bronchitis -She has continued tobacco abuse.  -WBC 11.4 -will admit patient to SDU, telemetry bed (due to hypokalemia) -Nebulizers: scheduled Duoneb  and prn albuterol -Solu-Medrol 80 mg IV daily  -IV doxycycline for 5 days.  -VBG: 7.429/51.1/37.4/30.9  Hypokalemia -Severe, K+ 2.3, prior 3.6 Given 60 mEq PO and 20 mEq IV in the ER -K+ would be expected to rise to 3.1 -Will given an additional 100 cc/hr of IVF with KCl 40 mEq -Possibly due to HCTZ, will hold  Acute renal failure -Sodium 130, likely hypovolemic hyponatremia -Creatinine 1.17, prior 0.54 -Will hydrate and follow -Hold ACE, HCTZ, and other nephrotoxic medications  Elevated troponin -Troponin 0.06 -Likely demand ischemia  DM -Glucose 199 -Hold Glucophage -Cover with SSI  Tobacco dependence -Encourage cessation.  This was discussed with the patient and should be reviewed on an ongoing basis.   -Patch ordered..  DVT prophylaxis: Lovenox Code Status: Full - confirmed with patient Family Communication: None present Disposition Plan:  Home once clinically improved Consults called: None  Admission status: Admit - It is my clinical opinion that admission to INPATIENT is reasonable and necessary because this patient will require at least 2 midnights in the hospital to treat this condition based on the medical complexity of the problems presented.  Given the aforementioned information, the predictability of an adverse outcome is felt to be significant.    Karmen Bongo MD [facility]  If 7PM-7AM, please contact night-coverage [email or web address] Password [facility]  06/12/2016, 10:32 PM

## 2016-06-13 DIAGNOSIS — J441 Chronic obstructive pulmonary disease with (acute) exacerbation: Principal | ICD-10-CM

## 2016-06-13 DIAGNOSIS — E1165 Type 2 diabetes mellitus with hyperglycemia: Secondary | ICD-10-CM

## 2016-06-13 DIAGNOSIS — E876 Hypokalemia: Secondary | ICD-10-CM

## 2016-06-13 DIAGNOSIS — I10 Essential (primary) hypertension: Secondary | ICD-10-CM

## 2016-06-13 DIAGNOSIS — J9621 Acute and chronic respiratory failure with hypoxia: Secondary | ICD-10-CM

## 2016-06-13 DIAGNOSIS — Z720 Tobacco use: Secondary | ICD-10-CM

## 2016-06-13 DIAGNOSIS — R748 Abnormal levels of other serum enzymes: Secondary | ICD-10-CM

## 2016-06-13 DIAGNOSIS — N179 Acute kidney failure, unspecified: Secondary | ICD-10-CM

## 2016-06-13 LAB — BASIC METABOLIC PANEL
ANION GAP: 8 (ref 5–15)
BUN: 17 mg/dL (ref 6–20)
CHLORIDE: 95 mmol/L — AB (ref 101–111)
CO2: 30 mmol/L (ref 22–32)
Calcium: 8.4 mg/dL — ABNORMAL LOW (ref 8.9–10.3)
Creatinine, Ser: 1.01 mg/dL — ABNORMAL HIGH (ref 0.44–1.00)
GFR calc non Af Amer: 58 mL/min — ABNORMAL LOW (ref >60)
Glucose, Bld: 282 mg/dL — ABNORMAL HIGH (ref 65–99)
POTASSIUM: 3.4 mmol/L — AB (ref 3.5–5.1)
Sodium: 133 mmol/L — ABNORMAL LOW (ref 135–145)

## 2016-06-13 LAB — TROPONIN I
TROPONIN I: 0.05 ng/mL — AB (ref <0.03)
TROPONIN I: 0.05 ng/mL — AB (ref <0.03)
Troponin I: 0.05 ng/mL (ref <0.03)

## 2016-06-13 LAB — GLUCOSE, CAPILLARY
GLUCOSE-CAPILLARY: 210 mg/dL — AB (ref 65–99)
Glucose-Capillary: 275 mg/dL — ABNORMAL HIGH (ref 65–99)
Glucose-Capillary: 192 mg/dL — ABNORMAL HIGH (ref 65–99)

## 2016-06-13 LAB — CBC
HEMATOCRIT: 38.6 % (ref 36.0–46.0)
HEMOGLOBIN: 13.2 g/dL (ref 12.0–15.0)
MCH: 32.4 pg (ref 26.0–34.0)
MCHC: 34.2 g/dL (ref 30.0–36.0)
MCV: 94.8 fL (ref 78.0–100.0)
Platelets: 191 10*3/uL (ref 150–400)
RBC: 4.07 MIL/uL (ref 3.87–5.11)
RDW: 14.7 % (ref 11.5–15.5)
WBC: 6.5 10*3/uL (ref 4.0–10.5)

## 2016-06-13 MED ORDER — DOXYCYCLINE HYCLATE 100 MG IV SOLR
INTRAVENOUS | Status: AC
  Filled 2016-06-13: qty 100

## 2016-06-13 MED ORDER — METHYLPREDNISOLONE SODIUM SUCC 40 MG IJ SOLR
40.0000 mg | Freq: Two times a day (BID) | INTRAMUSCULAR | Status: DC
  Administered 2016-06-13 – 2016-06-14 (×2): 40 mg via INTRAVENOUS
  Filled 2016-06-13 (×2): qty 1

## 2016-06-13 MED ORDER — INSULIN ASPART 100 UNIT/ML ~~LOC~~ SOLN
6.0000 [IU] | Freq: Three times a day (TID) | SUBCUTANEOUS | Status: DC
  Administered 2016-06-13 (×3): 6 [IU] via SUBCUTANEOUS

## 2016-06-13 MED ORDER — INSULIN GLARGINE 100 UNIT/ML ~~LOC~~ SOLN
14.0000 [IU] | Freq: Every day | SUBCUTANEOUS | Status: DC
  Administered 2016-06-13: 14 [IU] via SUBCUTANEOUS
  Filled 2016-06-13 (×4): qty 0.14

## 2016-06-13 NOTE — Evaluation (Signed)
Occupational Therapy Evaluation Patient Details Name: Holly May MRN: [id number] DOB: 1950/08/09 Today's Date: 06/13/2016    History of Present Illness Holly May is a 66 y.o. female with medical history significant of COPD not on home O2, chronic tobacco use, DM, and HTN presenting with breathing hard and feels like something in her chest is pulling.  Breathing has been acting up, but the last 3-4 days she has really gotten worse.  Hard to move because SOB.  Some productive sputum.  Left-sided chest pain.  Denies h/o heart disease.  +fevers, hot/cold sweats.  Has been taking 2 ?Neurontin at night.   Does not wear O2 at home.     Clinical Impression   Patient in bed upon therapy arrival and agreeable to participate in OT evaluation. Patient reports no weakness deficits since hospital admission. Strength and basic ADL tasks were assessed and patient is independent and at baseline. Patient reports that she does not do too much at home to begin with. She does simple meals if her son is not available to cook. She is able to complete all bathing and dressing independently. Pt states that she does not use any DME for mobility although she feels she may need a cane. PT was informed. No further OT needs at this time.    Follow Up Recommendations  No OT follow up    Equipment Recommendations  None recommended by OT    Recommendations for Other Services PT consult (Pt reports that she may need a cane.)     Precautions / Restrictions Precautions Precautions: None Precaution Comments: No reported falls in the past 6 months. Restrictions Weight Bearing Restrictions: No      Mobility Bed Mobility Overal bed mobility: Independent                Transfers Overall transfer level: Independent               General transfer comment: Only assist given with IV pole and oxygen tubing.    Balance                                            ADL Overall ADL's :  Independent                                       General ADL Comments: pt was able to donn shoes while seated on EOB independently. Patient ambulated to toilet from bed with only therapy providing assistance with IV pole and oxygen tubing. Patient complete all aspects of toileting independently and washed her hands while standing at sink without issues.     Vision Baseline Vision/History: No visual deficits Patient Visual Report: No change from baseline              Pertinent Vitals/Pain Pain Assessment: No/denies pain     Hand Dominance Right   Extremity/Trunk Assessment Upper Extremity Assessment Upper Extremity Assessment: Overall WFL for tasks assessed   Lower Extremity Assessment Lower Extremity Assessment: Defer to PT evaluation       Communication Communication Communication: No difficulties   Cognition Arousal/Alertness: Awake/alert Behavior During Therapy: WFL for tasks assessed/performed Overall Cognitive Status: Within Functional Limits for tasks assessed  Home Living Family/patient expects to be discharged to:: Private residence Living Arrangements: Spouse/significant other;Children (son) Available Help at Discharge: Available PRN/intermittently (Husband is in poor health. Son only works on the weekends.) Type of Home: House Home Access: Stairs to enter Technical brewer of Steps: 2-3  Entrance Stairs-Rails: None Home Layout: One level     Bathroom Shower/Tub: Teacher, early years/pre: Standard     Home Equipment: None          Prior Functioning/Environment Level of Independence: Independent        Comments: Pt reports that she doesn't do too much at home. Son will sometimes cook.                                  End of Session    Activity Tolerance: Patient tolerated treatment well Patient left: in bed;with call bell/phone within reach;with bed alarm  set;with nursing/sitter in room                   ADL either performed or assessed with clinical judgement  Time: 0831-0857 OT Time Calculation (min): 26 min Charges:  OT General Charges $OT Visit: 1 Procedure OT Evaluation $OT Eval Low Complexity: 1 Procedure G-Codes:     Ailene Ravel, OTR/L,CBIS  [phone]   Klynn Linnemann, [facility] 06/13/2016, 9:17 AM

## 2016-06-13 NOTE — Care Management (Signed)
    Durable Medical Equipment        Start     Ordered   06/13/16 1349  For home use only DME Cane  Once     06/13/16 1348

## 2016-06-13 NOTE — Clinical Social Work Note (Signed)
CSW received consult for COPD gold protocol. Pt does not meet criteria of 3 or more admissions in last 6 months. CSW signing off.  Benay Pike, [place]
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
## 2016-06-13 NOTE — Progress Notes (Signed)
PROGRESS NOTE    Holly May  ZOX:[id number]  DOB: 1950/09/09  DOA: 06/12/2016 PCP: [facility] Outpatient Specialists:   Hospital course: Holly May is a 66 y.o. female with medical history significant of COPD not on home O2, chronic tobacco use, DM, and HTN presenting with breathing hard and feels like something in her chest is pulling.  Breathing has been acting up, but the last 3-4 days she has really gotten worse.  Hard to move because SOB.  Some productive sputum.  Left-sided chest pain.  Denies h/o heart disease.  +fevers, hot/cold sweats.  Has been taking 2 ?Neurontin at night.   Does not wear O2 at home.  Assessment & Plan:   Acute on chronic respiratory failure - slowly improving with aggressive treatments. Continue current management. Acute COPD Exacerbation-continue current management as noted above. Slowly improving. AKI-improving with IV fluid hydration. Hypokalemia-replete and recheck in the morning. Type 2 Diabetes Mellitus with hyperglycemia - made some adjustments to insulin treatment program and will monitor closely. Chronic active nicotine dependence-counseled on cessation and provided nicotine patch Elevated troponin-suspect this is secondary to demand ischemia from respiratory failure. Monitor closely. Chest pain symptoms.  DVT prophylaxis: Lovenox Code Status: Full - confirmed with patient Family Communication: None present Disposition Plan:  Home once clinically improved  Subjective: Patient reports that she slowly starting to improve.  Objective: Vitals:   06/12/16 2250 06/13/16 0253 06/13/16 0650 06/13/16 0740  BP: (!) 120/54  (!) 123/56   Pulse: 72  66   Resp: 18  18   Temp: 99.1 F (37.3 C)  98.5 F (36.9 C)   TempSrc: Oral  Oral   SpO2: 94% (!) 85% 98% 99%  Weight: 67.9 kg (149 lb 11.2 oz)     Height:        Intake/Output Summary (Last 24 hours) at 06/13/16 0833 Last data filed at 06/12/16 2144  Gross per 24  hour  Intake              200 ml  Output                0 ml  Net              200 ml   Filed Weights   06/12/16 1806 06/12/16 2250  Weight: 67.6 kg (149 lb) 67.9 kg (149 lb 11.2 oz)    Exam:  General exam: Awake, alert, no apparent distress. Respiratory system: Shallow breathing. No increased work of breathing. Cardiovascular system: S1 & S2 heard Gastrointestinal system: Abdomen is nondistended, soft and nontender. Normal bowel sounds heard. Central nervous system: Alert and oriented. No focal neurological deficits. Extremities: no CCE.  Data Reviewed: Basic Metabolic Panel:  Recent Labs Lab 06/12/16 1808 06/13/16 0513  NA 130* 133*  K 2.3* 3.4*  CL 88* 95*  CO2 32 30  GLUCOSE 199* 282*  BUN 19 17  CREATININE 1.17* 1.01*  CALCIUM 8.4* 8.4*  MG 2.0  --    Liver Function Tests:  Recent Labs Lab 06/12/16 1808  AST 28  ALT 15  ALKPHOS 55  BILITOT 0.6  PROT 7.1  ALBUMIN 3.4*   No results for input(s): LIPASE, AMYLASE in the last 168 hours. No results for input(s): AMMONIA in the last 168 hours. CBC:  Recent Labs Lab 06/12/16 1808 06/13/16 0513  WBC 11.4* 6.5  NEUTROABS 6.5  --   HGB 13.7 13.2  HCT 39.4 38.6  MCV 94.5 94.8  PLT  186 191   Cardiac Enzymes:  Recent Labs Lab 06/12/16 1808 06/12/16 2312 06/13/16 0513  TROPONINI 0.06* 0.05* 0.05*   CBG (last 3)   Recent Labs  06/12/16 2352  GLUCAP 227*   No results found for this or any previous visit (from the past 240 hour(s)).   Studies: Dg Chest 2 View  Result Date: 06/12/2016 CLINICAL DATA:  Chest pain with productive cough and shortness of breath EXAM: CHEST  2 VIEW COMPARISON:  10/26/2015, 04/24/2015 FINDINGS: Hyperinflation is present. No pleural effusion or acute consolidation. Borderline cardiomegaly with mild central congestion. Atherosclerosis. No pneumothorax. Old left rib deformities. Stable mild wedging of mid thoracic vertebra. IMPRESSION: 1. Hyperinflation without acute  infiltrate or effusion 2. Borderline cardiomegaly with mild central congestion Electronically Signed   By: Donavan Foil M.D.   On: 06/12/2016 18:34     Scheduled Meds: . citalopram  40 mg Oral Daily  . doxycycline (VIBRAMYCIN) IV  100 mg Intravenous Q12H  . enoxaparin (LOVENOX) injection  40 mg Subcutaneous Q24H  . gabapentin  600 mg Oral QHS  . Influenza vac split quadrivalent PF  0.5 mL Intramuscular Tomorrow-1000  . insulin aspart  0-15 Units Subcutaneous TID WC  . insulin aspart  0-5 Units Subcutaneous QHS  . insulin aspart  6 Units Subcutaneous TID WC  . insulin glargine  14 Units Subcutaneous QHS  . ipratropium-albuterol  3 mL Nebulization Q6H  . methylPREDNISolone (SOLU-MEDROL) injection  60 mg Intravenous Q12H  . metoprolol succinate  25 mg Oral Daily  . nicotine  14 mg Transdermal Daily  . oxybutynin  5 mg Oral BID  . pneumococcal 23 valent vaccine  0.5 mL Intramuscular Tomorrow-1000  . pravastatin  40 mg Oral q1800  . sodium chloride flush  3 mL Intravenous Q12H   Continuous Infusions: . 0.9 % NaCl with KCl 40 mEq / L 100 mL/hr (06/12/16 2255)    Principal Problem:   Acute on chronic respiratory failure (HCC) Active Problems:   DM type 2 (diabetes mellitus, type 2) (HCC)   HTN (hypertension)   Tobacco abuse   Acute exacerbation of chronic obstructive pulmonary disease (COPD) (HCC)   Elevated troponin   Hypokalemia   Acute renal failure (ARF) ([place])  Time spent:   Irwin Brakeman, MD, FAAFP [facility] Pager [phone]  If 7PM-7AM, please contact night-coverage [email or web address] Password [facility] 06/13/2016, 8:33 AM    LOS: 1 day

## 2016-06-13 NOTE — Care Management Note (Addendum)
Case Management Note  Patient Details  Name: Holly May MRN: [id number] Date of Birth: 1951-02-15  Subjective/Objective:                  Admitted with COPD. She is from home, lives with family. ind with ADL's. No DME or HH pta. Has PCP, transportation to appointments and no difficulty affording medications. PT has recommended cane and OP PT. Pt has chosen [facility] from list of DME providers. Medicaid will not pay for OP PT and pt understands.   Action/Plan: Plan for DC home with self care. Holly May, of [facility] aware of DME referral and will obtain pt info from chart and deliver DME to pt room prior to DC. Pt will need to be weaned from O2 prior to DC.    Expected Discharge Date:     06/14/2016             Expected Discharge Plan:  Home/Self Care  In-House Referral:  Clinical Social Work  Discharge planning Services  CM Consult  Post Acute Care Choice:  Durable Medical Equipment Choice offered to:  Patient  DME Arranged:  Holly May DME Agency:  [place].  Status of Service:  Completed, signed off  Holly Barge, RN 06/13/2016, 3:53 PM

## 2016-06-13 NOTE — Evaluation (Signed)
Physical Therapy Evaluation Patient Details Name: Holly May MRN: [id number] DOB: 07-08-1950 Today's Date: 06/13/2016   History of Present Illness  Holly May is a 66 y.o. female with medical history significant of COPD not on home O2, chronic tobacco use, DM, and HTN presenting with breathing hard and feels like something in her chest is pulling.  Breathing has been acting up, but the last 3-4 days she has really gotten worse.  Hard to move because SOB.  Some productive sputum.  Left-sided chest pain.  Denies h/o heart disease.  +fevers, hot/cold sweats.  Has been taking 2 ?Neurontin at night.   Does not wear O2 at home  Clinical Impression  Pt received from nursing students lying in bed and was agreeable to PT evaluation. Per OT and pt, pt not very active at home at baseline but pt expressed to OT that she feels she may need a cane for ambulation. Pt independent with bed mobility and transfers (supervision intermittently due to poor control with descent of stand > sit) but required min guard for ambulation with SPC. Since pt does not use an assistive device at baseline, PT educated pt on how to properly use the cane during ambulation. After ambulating a few feet, pt verbalized that her cane was too short so PT adjusted it accordingly. Pt continued to not use the [facility] properly as she was basically just carrying it along with her and touching it to the floor intermittently. Pt unsteady throughout gait and had intermittent scissoring with BLE. Pt stated she has not had any falls in the last 6 months but has had some close calls. Recommending OPPT since pt verbalized no issues with getting out in the community on her own in order to improve strength, balance, and gait training to maximize her overall function. Pt initially refused this recommendation but after some education she appeared to be on board with attempting OPPT.    Follow Up Recommendations Outpatient PT    Equipment Recommendations   Cane    Recommendations for Other Services       Precautions / Restrictions Precautions Precautions: Fall Precaution Comments: pt denied any falls in the last 6 months but did report some close calls and reported an incident when she couldn't get up her steps  Restrictions Weight Bearing Restrictions: No      Mobility  Bed Mobility Overal bed mobility: Independent                Transfers Overall transfer level: Independent               General transfer comment: Only assist given with IV pole and oxygen tubing.  Ambulation/Gait Ambulation/Gait assistance: Min guard Ambulation Distance (Feet): 300 Feet Assistive device: Straight cane Gait Pattern/deviations: Staggering left;Staggering right;Narrow base of support;Scissoring     General Gait Details: pt with intermittent scissoring gait and was unsteady throughout 374ft with SPC - pt not properly using SPC properly even after fitting it to her and demonstrating proper use.  Stairs            Wheelchair Mobility    Modified Rankin (Stroke Patients Only)       Balance Overall balance assessment: Needs assistance Sitting-balance support: Feet supported Sitting balance-Leahy Scale: Normal     Standing balance support: Single extremity supported Standing balance-Leahy Scale: Fair Standing balance comment: unsteady throughout gait  Pertinent Vitals/Pain Pain Assessment: No/denies pain    Home Living Family/patient expects to be discharged to:: Private residence Living Arrangements: Spouse/significant other;Children (son) Available Help at Discharge: Available PRN/intermittently (Husband is in poor health. Son only works on the weekends.) Type of Home: House Home Access: Stairs to enter Entrance Stairs-Rails: None Technical brewer of Steps: 2-3  Home Layout: One level Home Equipment: None      Prior Function Level of Independence: Independent          Comments: Pt reports that she doesn't do too much at home. Son will sometimes cook.     Hand Dominance   Dominant Hand: Right    Extremity/Trunk Assessment   Upper Extremity Assessment Upper Extremity Assessment: Overall WFL for tasks assessed    Lower Extremity Assessment Lower Extremity Assessment: Generalized weakness       Communication   Communication: No difficulties  Cognition Arousal/Alertness: Awake/alert Behavior During Therapy: WFL for tasks assessed/performed Overall Cognitive Status: Within Functional Limits for tasks assessed                      General Comments      Exercises     Assessment/Plan    PT Assessment Patient needs continued PT services  PT Problem List Decreased balance;Decreased knowledge of use of DME;Decreased strength       PT Treatment Interventions DME instruction;Gait training;Functional mobility training;Therapeutic exercise;Balance training;Patient/family education    PT Goals (Current goals can be found in the Care Plan section)  Acute Rehab PT Goals Patient Stated Goal: to go home PT Goal Formulation: With patient Time For Goal Achievement: 06/17/16 Potential to Achieve Goals: Good    Frequency Min 3X/week   Barriers to discharge Decreased caregiver support per OT eval, pt has limited support at home    Co-evaluation               End of Session Equipment Utilized During Treatment: Gait belt;Oxygen;Other (comment) (SPC) Activity Tolerance: Patient tolerated treatment well;No increased pain Patient left: in chair;with call bell/phone within reach;with chair alarm set Nurse Communication: Mobility status PT Visit Diagnosis: Unsteadiness on feet (R26.81);Muscle weakness (generalized) (M62.81);Difficulty in walking, not elsewhere classified (R26.2)    Functional Assessment Tool Used: AM-PAC 6 Clicks Basic Mobility;Clinical judgement Functional Limitation: Mobility: Walking and moving  around Mobility: Walking and Moving Around Current Status (G2836): At least 20 percent but less than 40 percent impaired, limited or restricted Mobility: Walking and Moving Around Goal Status [phone]): At least 20 percent but less than 40 percent impaired, limited or restricted    Time: 0950-1015 PT Time Calculation (min) (ACUTE ONLY): 25 min   Charges:   PT Evaluation $PT Eval Low Complexity: 1 Procedure PT Treatments $Gait Training: 8-22 mins   PT G Codes:   PT G-Codes **NOT FOR INPATIENT CLASS** Functional Assessment Tool Used: AM-PAC 6 Clicks Basic Mobility;Clinical judgement Functional Limitation: Mobility: Walking and moving around Mobility: Walking and Moving Around Current Status (M5465): At least 20 percent but less than 40 percent impaired, limited or restricted Mobility: Walking and Moving Around Goal Status [phone]): At least 20 percent but less than 40 percent impaired, limited or restricted     Geraldine Solar PT, DPT

## 2016-06-13 NOTE — Progress Notes (Signed)
Initial Nutrition Assessment  DOCUMENTATION CODES:  Not applicable  INTERVENTION:  Glucerna Shake po BID, each supplement provides 220 kcal and 10 grams of protein   NUTRITION DIAGNOSIS:  Increased nutrient needs related to chronic illness (COPD) as evidenced by estimated nutritional requirements for this condition  GOAL:  Patient will meet greater than or equal to 90% of their needs  MONITOR:  PO intake, Supplement acceptance  REASON FOR ASSESSMENT:  Consult COPD Protocol  ASSESSMENT:  66 y/o female PMHx COPD on O2, Chronic Tobacco use, DM, HTN. Presented with SOB and chest pain. Worked up for critical potassium, acute renal failure and acute on chronic respiratory failure.   Encounter from this morning  Pt reports that she has had nausea, but no vomiting, as well as constipation. She also says she has had a loss of her appetite. She is eating only 1 meal per day.  This is a chronic loss of appetite.   She took a vitamin D supplement. Did not drink nutritional beverages. She did not follow a diabetic diet and admits to infrequently checking her BG. She says her metformin has been decreased due to her improved BG.   She claims to have lost >60 lbs. She gives a UBW of 213 lbs and says she is "underweight" now. There is no documentation in the past five years to confirm this drastic of a weight loss  She was agreeable to supplementation while she is admitted.   NFPE: Well nourished, no wasting  Medications: IV ABC, Insulin, methylprednisolone,  Labs: Hypokalemia improved, BG >200, Albumin: 3.4   Recent Labs Lab 06/12/16 1808 06/13/16 0513  NA 130* 133*  K 2.3* 3.4*  CL 88* 95*  CO2 32 30  BUN 19 17  CREATININE 1.17* 1.01*  CALCIUM 8.4* 8.4*  MG 2.0  --   GLUCOSE 199* 282*   Diet Order:  Diet Carb Modified Fluid consistency: Thin; Room service appropriate? Yes  Skin:  Reviewed, no issues  Last BM:  3/13  Height:  Ht Readings from Last 1 Encounters:  06/12/16  5\' 6"  (1.676 m)   Weight:  Wt Readings from Last 1 Encounters:  06/12/16 149 lb 11.2 oz (67.9 kg)   Wt Readings from Last 10 Encounters:  06/12/16 149 lb 11.2 oz (67.9 kg)  05/19/15 149 lb (67.6 kg)  04/24/15 157 lb (71.2 kg)  05/18/14 157 lb (71.2 kg)  06/15/13 149 lb (67.6 kg)  05/07/13 142 lb (64.4 kg)  04/06/13 137 lb (62.1 kg)  03/31/13 143 lb (64.9 kg)   Ideal Body Weight:  59.1 kg  BMI:  Body mass index is 24.16 kg/m.  Estimated Nutritional Needs:  Kcal:  1700-1900 (25-28 kcal/kg bw) Protein:  80-95 g (1.2-1.4 g/kg bw) Fluid:  1.7-1.9 L fluid (54ml/kcal)  EDUCATION NEEDS:  No education needs identified at this time  Burtis Junes RD, LDN, [place] [facility] Pager: [phone] 06/13/2016 5:18 PM

## 2016-06-14 LAB — CBC
HCT: 36.6 % (ref 36.0–46.0)
Hemoglobin: 12.6 g/dL (ref 12.0–15.0)
MCH: 32.6 pg (ref 26.0–34.0)
MCHC: 34.4 g/dL (ref 30.0–36.0)
MCV: 94.8 fL (ref 78.0–100.0)
PLATELETS: 212 10*3/uL (ref 150–400)
RBC: 3.86 MIL/uL — ABNORMAL LOW (ref 3.87–5.11)
RDW: 14.8 % (ref 11.5–15.5)
WBC: 16.3 10*3/uL — ABNORMAL HIGH (ref 4.0–10.5)

## 2016-06-14 LAB — BASIC METABOLIC PANEL
Anion gap: 10 (ref 5–15)
BUN: 15 mg/dL (ref 6–20)
CHLORIDE: 96 mmol/L — AB (ref 101–111)
CO2: 29 mmol/L (ref 22–32)
CREATININE: 0.73 mg/dL (ref 0.44–1.00)
Calcium: 8.8 mg/dL — ABNORMAL LOW (ref 8.9–10.3)
GFR calc Af Amer: >60 mL/min (ref >60)
GFR calc non Af Amer: >60 mL/min (ref >60)
GLUCOSE: 187 mg/dL — AB (ref 65–99)
Potassium: 3.4 mmol/L — ABNORMAL LOW (ref 3.5–5.1)
SODIUM: 135 mmol/L (ref 135–145)

## 2016-06-14 LAB — GLUCOSE, CAPILLARY
Glucose-Capillary: 196 mg/dL — ABNORMAL HIGH (ref 65–99)
Glucose-Capillary: 192 mg/dL — ABNORMAL HIGH (ref 65–99)

## 2016-06-14 LAB — HEMOGLOBIN A1C
Hgb A1c MFr Bld: 6.1 % — ABNORMAL HIGH (ref 4.8–5.6)
Mean Plasma Glucose: 128 mg/dL

## 2016-06-14 LAB — HIV ANTIBODY (ROUTINE TESTING W REFLEX): HIV SCREEN 4TH GENERATION: NONREACTIVE

## 2016-06-14 MED ORDER — CITALOPRAM HYDROBROMIDE 20 MG PO TABS: 20.0000 mg | ORAL_TABLET | Freq: Every day | ORAL | 0 refills | Status: DC

## 2016-06-14 MED ORDER — NICOTINE 21 MG/24HR TD PT24: 21.0000 mg | MEDICATED_PATCH | Freq: Every day | TRANSDERMAL | 0 refills | Status: AC

## 2016-06-14 MED ORDER — POTASSIUM CHLORIDE ER 20 MEQ PO TBCR: 20.0000 meq | EXTENDED_RELEASE_TABLET | Freq: Every day | ORAL | 0 refills | Status: DC

## 2016-06-14 MED ORDER — DOXYCYCLINE HYCLATE 100 MG PO CAPS: 100.0000 mg | ORAL_CAPSULE | Freq: Two times a day (BID) | ORAL | 0 refills | Status: AC

## 2016-06-14 MED ORDER — ALBUTEROL SULFATE HFA 108 (90 BASE) MCG/ACT IN AERS: 2.0000 | INHALATION_SPRAY | RESPIRATORY_TRACT | 1 refills | Status: DC | PRN

## 2016-06-14 MED ORDER — PREDNISONE 20 MG PO TABS: 40.0000 mg | ORAL_TABLET | Freq: Every day | ORAL | 0 refills | Status: AC

## 2016-06-14 NOTE — Discharge Summary (Signed)
Physician Discharge Summary  Holly May YOV:[id number] DOB: 27-Apr-1950 DOA: 06/12/2016  PCP: [facility]  Admit date: 06/12/2016 Discharge date: 06/14/2016  Admitted From: Home  Disposition:  Home   Recommendations for Outpatient Follow-up:  1. Follow up with PCP in 1 weeks 2. Please obtain BMP/CBC in one week 3. Please help with tobacco cessation  Discharge Condition: STaBLE  CODE STATUS: FULL   Brief/Interim Summary: Holly May is a 66 y.o. female with medical history significant of COPD not on home O2, chronic tobacco use, DM, and HTN presenting with breathing hard and feels like something in her chest is pulling.  Breathing has been acting up, but the last 3-4 days she has really gotten worse.  Hard to move because SOB.  Some productive sputum.  Left-sided chest pain.  Denies h/o heart disease.  +fevers, hot/cold sweats.  Has been taking 2 ?Neurontin at night.   Does not wear O2 at home.     ED Course:  O2 for comfort.  Duoneb and Solumedrol with some improvement but ongoing wheezing and dyspnea.  Critical potassium of 2.3, borderline QTc prolongation, given 60 mEq PO and 20 mEq IV KCl (would be expected to raise K+ to 3.1).  Chest mag.  Troponin 0.06, likely demand ischemia.  Acute on chronic respiratory failure - Much improved with aggressive treatments. Discharge home on oral steroids and antibiotics and close outpatient follow up with PCP.  Acute COPD Exacerbation - much improved, off oxygen, wants to go home.  AKI-improved with IV fluid hydration. Hypokalemia-repleted Type 2 Diabetes Mellitus with hyperglycemia - steroids caused hyperglycemia but improved and will resume home regimen at discharge.  Chronic active nicotine dependence-counseled on cessation and provided nicotine patch Elevated troponin-suspect this is secondary to demand ischemia from respiratory failure. Remained flat. No chest pain.    DVT prophylaxis:Lovenox Code  Status:Full - confirmed with patient Family Communication:sister Disposition Plan:Home    Discharge Diagnoses:  Principal Problem:   Acute on chronic respiratory failure (HCC) Active Problems:   DM type 2 (diabetes mellitus, type 2) (HCC)   HTN (hypertension)   Tobacco abuse   Acute exacerbation of chronic obstructive pulmonary disease (COPD) (HCC)   Elevated troponin   Hypokalemia   Acute renal failure (ARF) ([place])  Discharge Instructions  Discharge Instructions    Increase activity slowly    Complete by:  As directed      Allergies as of 06/14/2016   No Known Allergies     Medication List    STOP taking these medications   metoprolol 100 MG tablet Commonly known as:  LOPRESSOR     TAKE these medications   albuterol 108 (90 Base) MCG/ACT inhaler Commonly known as:  PROVENTIL HFA;VENTOLIN HFA Inhale 2 puffs into the lungs every 4 (four) hours as needed for wheezing or shortness of breath (cough, shortness of breath or wheezing.). What changed:  You were already taking a medication with the same name, and this prescription was added. Make sure you understand how and when to take each.   albuterol 108 (90 Base) MCG/ACT inhaler Commonly known as:  PROVENTIL HFA;VENTOLIN HFA Inhale 2 puffs into the lungs every 6 (six) hours as needed for wheezing. What changed:  Another medication with the same name was added. Make sure you understand how and when to take each.   amLODipine 10 MG tablet Commonly known as:  NORVASC Take 10 mg by mouth daily.   citalopram 20 MG tablet Commonly known as:  CELEXA Take 1 tablet (20 mg total) by mouth daily. What changed:  medication strength  how much to take   doxycycline 100 MG capsule Commonly known as:  VIBRAMYCIN Take 1 capsule (100 mg total) by mouth 2 (two) times daily.   gabapentin 300 MG capsule Commonly known as:  NEURONTIN Take 600 mg by mouth at bedtime.   hydrochlorothiazide 25 MG tablet Commonly known as:   HYDRODIURIL Take 25 mg by mouth daily.   lisinopril 40 MG tablet Commonly known as:  PRINIVIL,ZESTRIL Take 40 mg by mouth daily.   lovastatin 40 MG tablet Commonly known as:  MEVACOR Take 40 mg by mouth at bedtime.   metFORMIN 1000 MG tablet Commonly known as:  GLUCOPHAGE Take 500 mg by mouth daily with breakfast.   metoprolol succinate 25 MG 24 hr tablet Commonly known as:  TOPROL-XL Take 25 mg by mouth daily.   nicotine 21 mg/24hr patch Commonly known as:  NICODERM CQ - dosed in mg/24 hours Place 1 patch (21 mg total) onto the skin daily. Start taking on:  06/15/2016   oxybutynin 5 MG tablet Commonly known as:  DITROPAN Take 5 mg by mouth 2 (two) times daily.   Potassium Chloride ER 20 MEQ Tbcr Take 20 mEq by mouth daily.   predniSONE 20 MG tablet Commonly known as:  DELTASONE Take 2 tablets (40 mg total) by mouth daily with breakfast.   traZODone 50 MG tablet Commonly known as:  DESYREL Take 50 mg by mouth at bedtime.   Vitamin D (Ergocalciferol) 50000 units Caps capsule Commonly known as:  DRISDOL Take 50,000 Units by mouth every 7 (seven) days.            Durable Medical Equipment        Start     Ordered   06/13/16 1349  For home use only DME Cane  Once     06/13/16 [place] [facility] Schedule an appointment as soon as possible for a visit in 1 week(s).   Contact information: [address] [place] [address] [phone]          No Known Allergies  Procedures/Studies: Dg Chest 2 View  Result Date: 06/12/2016 CLINICAL DATA:  Chest pain with productive cough and shortness of breath EXAM: CHEST  2 VIEW COMPARISON:  10/26/2015, 04/24/2015 FINDINGS: Hyperinflation is present. No pleural effusion or acute consolidation. Borderline cardiomegaly with mild central congestion. Atherosclerosis. No pneumothorax. Old left rib deformities. Stable mild wedging of mid thoracic vertebra. IMPRESSION:  1. Hyperinflation without acute infiltrate or effusion 2. Borderline cardiomegaly with mild central congestion Electronically Signed   By: Donavan Foil M.D.   On: 06/12/2016 18:34    (Echo, Carotid, EGD, Colonoscopy, ERCP)    Subjective: Pt feels better and wants to go home.   Discharge Exam: Vitals:   06/13/16 2012 06/14/16 0426  BP: (!) 156/62 136/63  Pulse: 77 66  Resp: 18 18  Temp: 97.7 F (36.5 C) 98.1 F (36.7 C)   Vitals:   06/14/16 0121 06/14/16 0231 06/14/16 0426 06/14/16 0814  BP:   136/63   Pulse:   66   Resp:   18   Temp:   98.1 F (36.7 C)   TempSrc:   Oral   SpO2: 98% 98% 94% 98%  Weight:      Height:        General exam: Awake, alert, no apparent distress. Respiratory system:  Lungs Clear to auscultation.  No increased work of breathing. Cardiovascular system: S1 & S2 heard Gastrointestinal system: Abdomen is nondistended, soft and nontender. Normal bowel sounds heard. Central nervous system: Alert and oriented. No focal neurological deficits. Extremities: no CCE.  The results of significant diagnostics from this hospitalization (including imaging, microbiology, ancillary and laboratory) are listed below for reference.     Microbiology: No results found for this or any previous visit (from the past 240 hour(s)).   Labs: BNP (last 3 results) No results for input(s): BNP in the last 8760 hours. Basic Metabolic Panel:  Recent Labs Lab 06/12/16 1808 06/13/16 0513 06/14/16 0406  NA 130* 133* 135  K 2.3* 3.4* 3.4*  CL 88* 95* 96*  CO2 32 30 29  GLUCOSE 199* 282* 187*  BUN 19 17 15   CREATININE 1.17* 1.01* 0.73  CALCIUM 8.4* 8.4* 8.8*  MG 2.0  --   --    Liver Function Tests:  Recent Labs Lab 06/12/16 1808  AST 28  ALT 15  ALKPHOS 55  BILITOT 0.6  PROT 7.1  ALBUMIN 3.4*   No results for input(s): LIPASE, AMYLASE in the last 168 hours. No results for input(s): AMMONIA in the last 168 hours. CBC:  Recent Labs Lab 06/12/16 1808  06/13/16 0513 06/14/16 0406  WBC 11.4* 6.5 16.3*  NEUTROABS 6.5  --   --   HGB 13.7 13.2 12.6  HCT 39.4 38.6 36.6  MCV 94.5 94.8 94.8  PLT 186 191 212   Cardiac Enzymes:  Recent Labs Lab 06/12/16 1808 06/12/16 2312 06/13/16 0513 06/13/16 1035  TROPONINI 0.06* 0.05* 0.05* 0.05*   BNP: Invalid input(s): POCBNP CBG:  Recent Labs Lab 06/13/16 1153 06/13/16 1711 06/13/16 2333 06/14/16 0330 06/14/16 0747  GLUCAP 275* 192* 210* 196* 192*   D-Dimer No results for input(s): DDIMER in the last 72 hours. Hgb A1c  Recent Labs  06/12/16 1808  HGBA1C 6.1*   Lipid Profile No results for input(s): CHOL, HDL, LDLCALC, TRIG, CHOLHDL, LDLDIRECT in the last 72 hours. Thyroid function studies No results for input(s): TSH, T4TOTAL, T3FREE, THYROIDAB in the last 72 hours.  Invalid input(s): FREET3 Anemia work up No results for input(s): VITAMINB12, FOLATE, FERRITIN, TIBC, IRON, RETICCTPCT in the last 72 hours. Urinalysis    Component Value Date/Time   COLORURINE YELLOW 03/31/2013 [place] 03/31/2013 0454   LABSPEC 1.025 03/31/2013 0454   PHURINE 6.0 03/31/2013 0454   GLUCOSEU NEGATIVE 03/31/2013 0454   HGBUR LARGE (A) 03/31/2013 0454   BILIRUBINUR SMALL (A) 03/31/2013 0454   KETONESUR TRACE (A) 03/31/2013 0454   PROTEINUR 100 (A) 03/31/2013 0454   UROBILINOGEN 4.0 (H) 03/31/2013 0454   NITRITE NEGATIVE 03/31/2013 0454   LEUKOCYTESUR TRACE (A) 03/31/2013 0454   Sepsis Labs Invalid input(s): PROCALCITONIN,  WBC,  LACTICIDVEN Microbiology No results found for this or any previous visit (from the past 240 hour(s)).  Time coordinating discharge: 32 minutes  SIGNED:  Irwin Brakeman, MD  [facility] 06/14/2016, 10:04 AM Pager   If 7PM-7AM, please contact night-coverage [email or web address] Password [email or web address]

## 2016-10-22 ENCOUNTER — Other Ambulatory Visit (HOSPITAL_COMMUNITY): Payer: Self-pay | Admitting: Internal Medicine

## 2016-10-22 DIAGNOSIS — J441 Chronic obstructive pulmonary disease with (acute) exacerbation: Secondary | ICD-10-CM

## 2016-10-28 ENCOUNTER — Ambulatory Visit (HOSPITAL_COMMUNITY)
Admission: RE | Admit: 2016-10-28 | Discharge: 2016-10-28 | Disposition: A | Discharge status: Home / Self Care | Payer: Medicaid Other | Source: Ambulatory Visit | Attending: Internal Medicine | Admitting: Internal Medicine

## 2016-10-28 DIAGNOSIS — K760 Fatty (change of) liver, not elsewhere classified: Secondary | ICD-10-CM | POA: Diagnosis not present

## 2016-10-28 DIAGNOSIS — J441 Chronic obstructive pulmonary disease with (acute) exacerbation: Secondary | ICD-10-CM

## 2016-10-28 DIAGNOSIS — R918 Other nonspecific abnormal finding of lung field: Secondary | ICD-10-CM | POA: Insufficient documentation

## 2016-10-28 DIAGNOSIS — F17200 Nicotine dependence, unspecified, uncomplicated: Secondary | ICD-10-CM | POA: Insufficient documentation

## 2016-10-28 DIAGNOSIS — I2510 Atherosclerotic heart disease of native coronary artery without angina pectoris: Secondary | ICD-10-CM | POA: Insufficient documentation

## 2016-10-28 LAB — POCT I-STAT CREATININE: Creatinine, Ser: 1 mg/dL (ref 0.44–1.00)

## 2016-10-28 MED ORDER — IOPAMIDOL (ISOVUE-300) INJECTION 61%
75.0000 mL | Freq: Once | INTRAVENOUS | Status: AC | PRN
  Administered 2016-10-28: 75 mL via INTRAVENOUS

## 2017-03-29 ENCOUNTER — Emergency Department (HOSPITAL_COMMUNITY): Payer: Medicaid Other

## 2017-03-29 ENCOUNTER — Encounter (HOSPITAL_COMMUNITY): Payer: Self-pay | Admitting: Emergency Medicine

## 2017-03-29 ENCOUNTER — Other Ambulatory Visit: Payer: Self-pay

## 2017-03-29 ENCOUNTER — Emergency Department (HOSPITAL_COMMUNITY)
Admission: EM | Admit: 2017-03-29 | Discharge: 2017-03-29 | Disposition: A | Discharge status: Home / Self Care | Payer: Medicaid Other | Attending: Emergency Medicine | Admitting: Emergency Medicine

## 2017-03-29 DIAGNOSIS — Z7984 Long term (current) use of oral hypoglycemic drugs: Secondary | ICD-10-CM | POA: Insufficient documentation

## 2017-03-29 DIAGNOSIS — J189 Pneumonia, unspecified organism: Secondary | ICD-10-CM

## 2017-03-29 DIAGNOSIS — R0981 Nasal congestion: Secondary | ICD-10-CM | POA: Insufficient documentation

## 2017-03-29 DIAGNOSIS — J449 Chronic obstructive pulmonary disease, unspecified: Secondary | ICD-10-CM | POA: Insufficient documentation

## 2017-03-29 DIAGNOSIS — M25571 Pain in right ankle and joints of right foot: Secondary | ICD-10-CM | POA: Insufficient documentation

## 2017-03-29 DIAGNOSIS — I10 Essential (primary) hypertension: Secondary | ICD-10-CM | POA: Insufficient documentation

## 2017-03-29 DIAGNOSIS — E119 Type 2 diabetes mellitus without complications: Secondary | ICD-10-CM | POA: Insufficient documentation

## 2017-03-29 DIAGNOSIS — R05 Cough: Secondary | ICD-10-CM | POA: Diagnosis not present

## 2017-03-29 DIAGNOSIS — F17210 Nicotine dependence, cigarettes, uncomplicated: Secondary | ICD-10-CM | POA: Diagnosis not present

## 2017-03-29 DIAGNOSIS — R0602 Shortness of breath: Secondary | ICD-10-CM | POA: Diagnosis not present

## 2017-03-29 DIAGNOSIS — R0789 Other chest pain: Secondary | ICD-10-CM | POA: Diagnosis present

## 2017-03-29 DIAGNOSIS — J181 Lobar pneumonia, unspecified organism: Secondary | ICD-10-CM | POA: Diagnosis not present

## 2017-03-29 LAB — COMPREHENSIVE METABOLIC PANEL
ALT: 15 U/L (ref 14–54)
ANION GAP: 14 (ref 5–15)
AST: 22 U/L (ref 15–41)
Albumin: 4 g/dL (ref 3.5–5.0)
Alkaline Phosphatase: 85 U/L (ref 38–126)
BUN: 15 mg/dL (ref 6–20)
CHLORIDE: 95 mmol/L — AB (ref 101–111)
CO2: 28 mmol/L (ref 22–32)
CREATININE: 0.77 mg/dL (ref 0.44–1.00)
Calcium: 9.5 mg/dL (ref 8.9–10.3)
Glucose, Bld: 97 mg/dL (ref 65–99)
POTASSIUM: 3.4 mmol/L — AB (ref 3.5–5.1)
Sodium: 137 mmol/L (ref 135–145)
Total Bilirubin: 0.5 mg/dL (ref 0.3–1.2)
Total Protein: 7.4 g/dL (ref 6.5–8.1)

## 2017-03-29 LAB — CBC WITH DIFFERENTIAL/PLATELET
Basophils Absolute: 0 10*3/uL (ref 0.0–0.1)
Basophils Relative: 0 %
EOS ABS: 0.3 10*3/uL (ref 0.0–0.7)
EOS PCT: 3 %
HCT: 43.2 % (ref 36.0–46.0)
Hemoglobin: 14.4 g/dL (ref 12.0–15.0)
LYMPHS ABS: 4.3 10*3/uL — AB (ref 0.7–4.0)
LYMPHS PCT: 34 %
MCH: 32.3 pg (ref 26.0–34.0)
MCHC: 33.3 g/dL (ref 30.0–36.0)
MCV: 96.9 fL (ref 78.0–100.0)
MONO ABS: 0.8 10*3/uL (ref 0.1–1.0)
Monocytes Relative: 6 %
Neutro Abs: 7.2 10*3/uL (ref 1.7–7.7)
Neutrophils Relative %: 57 %
PLATELETS: 260 10*3/uL (ref 150–400)
RBC: 4.46 MIL/uL (ref 3.87–5.11)
RDW: 13.7 % (ref 11.5–15.5)
WBC: 12.6 10*3/uL — ABNORMAL HIGH (ref 4.0–10.5)

## 2017-03-29 LAB — LIPASE, BLOOD: LIPASE: 14 U/L (ref 11–51)

## 2017-03-29 LAB — D-DIMER, QUANTITATIVE (NOT AT ARMC): D DIMER QUANT: 0.67 ug{FEU}/mL — AB (ref 0.00–0.50)

## 2017-03-29 LAB — TROPONIN I

## 2017-03-29 MED ORDER — DEXTROSE 5 % IV SOLN
1.0000 g | Freq: Once | INTRAVENOUS | Status: AC
  Administered 2017-03-29: 1 g via INTRAVENOUS
  Filled 2017-03-29: qty 10

## 2017-03-29 MED ORDER — AZITHROMYCIN 250 MG PO TABS
500.0000 mg | ORAL_TABLET | Freq: Once | ORAL | Status: AC
  Administered 2017-03-29: 500 mg via ORAL
  Filled 2017-03-29: qty 2

## 2017-03-29 MED ORDER — IOPAMIDOL (ISOVUE-370) INJECTION 76%
80.0000 mL | Freq: Once | INTRAVENOUS | Status: AC | PRN
  Administered 2017-03-29: 80 mL via INTRAVENOUS

## 2017-03-29 MED ORDER — ACETAMINOPHEN 325 MG PO TABS
650.0000 mg | ORAL_TABLET | Freq: Once | ORAL | Status: AC
  Administered 2017-03-29: 650 mg via ORAL
  Filled 2017-03-29: qty 2

## 2017-03-29 MED ORDER — DOXYCYCLINE HYCLATE 100 MG PO CAPS: 100.0000 mg | ORAL_CAPSULE | Freq: Two times a day (BID) | ORAL | 0 refills | Status: DC

## 2017-03-29 MED ORDER — IPRATROPIUM-ALBUTEROL 0.5-2.5 (3) MG/3ML IN SOLN
3.0000 mL | Freq: Once | RESPIRATORY_TRACT | Status: AC
  Administered 2017-03-29: 3 mL via RESPIRATORY_TRACT
  Filled 2017-03-29: qty 3

## 2017-03-29 NOTE — ED Triage Notes (Signed)
Patient complains of cough and congestion x 2 weeks. Also states she twisted her right ankle 2 weeks ago in the snow.

## 2017-03-29 NOTE — ED Notes (Signed)
Ambulated to end of hallway, pt 96%-98% O2 with ambulation, pt has steady gait, requires no assistance and denies SOB, dizziness and lightheadedness

## 2017-03-29 NOTE — ED Provider Notes (Signed)
Pt is a 66 year old with a history of COPD, hypertension and DM type II with new onset left-sided chest pain which she woke with this morning, not reproducible, slightly elevated d-dimer but a negative CT angio regarding PE findings.  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  Patient was given a DuoNeb with some improvement in wheezing and her cough is been more productive, now producing green sputum.  She states her breathing is at baseline.  She has no significant wheezing with expiration on exam after neb treatment was given.  She still has a coarse right sided breath sounds.  Patient was ambulated in the department without shortness of breath, weakness, dizziness or desaturation with her pulse ox is remaining above 96%.  Low risk curb-65.  Discussed admission versus going home, patient is motivated to go home, but understands the need to return for any worsening symptoms including shortness of breath, chest pain, spiking fevers.  She was sent home on doxycycline twice daily for 10 days given her history of QT prolongation.  Advised close follow-up with her PCP or returning here for any worsening symptoms over the weekend.  Also discussed CT lung findings and need for follow-up evaluation of these findings.  Patient understands and will follow up with her primary provider regarding this.       Evalee Jefferson, PA-C 03/29/17 2102    Evalee Jefferson, PA-C 03/29/17 2110    Davonna Belling, MD 03/30/17 0000

## 2017-03-29 NOTE — ED Notes (Signed)
Pt state that she woke up with left chest pain with sob.

## 2017-03-29 NOTE — Discharge Instructions (Signed)
As discussed you have some findings on your CT scan that will need follow-up care which you should discuss with your primary doctor.  It is recommended that you have a repeat CT scan of your chest in 3 months to confirm that the infection in your right lower lung is resolved and also to make sure that this was an infection and not a mass as the source of today's symptoms.  Additionally you have a small mass in your upper chest as we discussed which has been slowly getting larger over the past several years, slightly bigger today and it is suggested that you see a cardiothoracic surgeon to discuss this finding.  In the interim, you need to complete the entire course of the antibiotics as prescribed.  Continue also using your inhaler if needed for wheezing or shortness of breath.  Return here if you develop worsening symptoms as discussed.

## 2017-03-29 NOTE — ED Provider Notes (Signed)
[facility] EMERGENCY DEPARTMENT Provider Note   CSN: [id number] Arrival date & time: 03/29/17  1254     History   Chief Complaint Chief Complaint  Patient presents with  . Cough  . Nasal Congestion  . Ankle Pain    HPI Holly May is a 66 y.o. female with a history of COPD, HTN, and DM Type II who presents to the emergency department with a chief complaint of left-sided, non-radiating chest pain that has been constant since onset when she awoke this morning.  She states that the pain is worse with coughing and alleviated by nothing. No h/o of similar.   She also complains of productive cough and nasal congestion that has been worsening over the last 2 weeks.  She denies fever, chills, back pain, nausea, vomiting, diarrhea, abdominal pain, leg swelling, sore throat, headache, neck pain or stiffness, or rash.  She reports a history of dyspnea at baseline, and is uncertain of her dyspnea has been worse over the last few weeks. She is unable to state if anything aggravates or alleviates her dyspnea. She endorses daily use of her albuterol inhaler, typically 3-4 times a day.  She denies recent increase in albuterol use.  She also endorses constant, mild right ankle pain that began 2 weeks ago after she slipped on mud 2 weeks ago.  No treatment prior to arrival.  She reports pain is worse with weightbearing on the right leg and alleviated by nothing.  Review of her medical record indicates her last admission was 3/14-16/18 for elevated troponin and acute on chronic respiratory failure, COPD exacerbation.  During admission, she had a critical potassium of 2.3, borderline QTC prolongation, and an elevated troponin likely secondary to demand ischemia.   Cardiolite in 2016 with low risk without active ischemia and normal LVEF.  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  She is a poor historian.  The history is provided by the patient. No language interpreter was used.    Past Medical History:  Diagnosis Date  . Arthritis   . Depression   . Essential hypertension, benign   . Type 2 diabetes mellitus ([place])   . Urinary incontinence     Patient Active Problem List   Diagnosis Date Noted  . Acute on chronic respiratory failure ([place]) 06/12/2016  . Acute exacerbation of chronic obstructive pulmonary disease (COPD) ([place]) 06/12/2016  . Elevated troponin 06/12/2016  . Hypokalemia 06/12/2016  . Acute renal failure (ARF) ([place]) 06/12/2016  . Preoperative cardiovascular examination 05/18/2014  . Abnormal ECG 05/18/2014  . Humerus shaft fracture 04/06/2013  . Closed fracture of unspecified part of upper end of humerus 04/06/2013  . Syncope 03/31/2013  . Fracture of left humerus 03/31/2013  . DM type 2 (diabetes mellitus, type 2) ([place]) 03/31/2013  . HTN (hypertension) 03/31/2013  . Heme positive stool 03/31/2013  . Normocytic anemia 03/31/2013  . Tobacco abuse 03/31/2013    Past Surgical History:  Procedure Laterality Date  . CHOLECYSTECTOMY      OB History    No data available       Home Medications    Prior to Admission medications   Medication Sig Start Date End Date Taking? Authorizing Provider  albuterol (PROVENTIL HFA;VENTOLIN HFA) 108 (90 BASE) MCG/ACT inhaler Inhale 2 puffs into the lungs every 6 (six) hours as needed for wheezing.  [provider]  albuterol (PROVENTIL HFA;VENTOLIN HFA) 108 (90 Base) MCG/ACT inhaler Inhale 2 puffs into the lungs every 4 (four) hours as needed for wheezing or shortness of breath (cough, shortness of breath or wheezing.). 06/14/16   Johnson, Clanford L, MD  amLODipine (NORVASC) 10 MG tablet Take 10 mg by mouth daily.    [provider]  citalopram (CELEXA) 20 MG tablet Take 1 tablet (20 mg total) by mouth daily. 06/14/16   Johnson, Clanford  L, MD  gabapentin (NEURONTIN) 300 MG capsule Take 600 mg by mouth at bedtime.     [provider]  hydrochlorothiazide (HYDRODIURIL) 25 MG tablet Take 25 mg by mouth daily.    [provider]  lisinopril (PRINIVIL,ZESTRIL) 40 MG tablet Take 40 mg by mouth daily.    [provider]  lovastatin (MEVACOR) 40 MG tablet Take 40 mg by mouth at bedtime.    [provider]  metFORMIN (GLUCOPHAGE) 1000 MG tablet Take 500 mg by mouth daily with breakfast.     [provider]  metoprolol succinate (TOPROL-XL) 25 MG 24 hr tablet Take 25 mg by mouth daily.    [provider]  oxybutynin (DITROPAN) 5 MG tablet Take 5 mg by mouth 2 (two) times daily.     [provider]  potassium chloride 20 MEQ TBCR Take 20 mEq by mouth daily. 06/14/16   Johnson, Clanford L, MD  traZODone (DESYREL) 50 MG tablet Take 50 mg by mouth at bedtime.    [provider]  Vitamin D, Ergocalciferol, (DRISDOL) 50000 units CAPS capsule Take 50,000 Units by mouth every 7 (seven) days.    [provider]    Family History Family History  Problem Relation Age of Onset  . Diabetes Mellitus II Sister   . Diabetes Mellitus II Mother   . Hypertension Brother   . Hypertension Sister     Social History Social History   Tobacco Use  . Smoking status: Current Every Day Smoker    Packs/day: 1.00    Years: 43.00    Pack years: 43.00    Types: Cigarettes    Start date: 01/09/1971  . Smokeless tobacco: Never Used  Substance Use Topics  . Alcohol use: Yes    Alcohol/week: 0.0 oz    Comment: sometimes  . Drug use: No     Allergies   Patient has no known allergies.   Review of Systems Review of Systems  Constitutional: Negative for chills and fever.  HENT: Positive for congestion. Negative for sore throat.   Eyes: Negative for visual disturbance.  Respiratory: Positive for cough and shortness of breath. Negative for chest tightness.     Cardiovascular: Positive for chest pain. Negative for leg swelling.  Gastrointestinal: Negative for abdominal pain, diarrhea, nausea and vomiting.  Genitourinary: Negative for dysuria.  Musculoskeletal: Negative for back pain.  Skin: Negative for rash.  Allergic/Immunologic: Positive for immunocompromised state.  Neurological: Negative for dizziness, syncope and headaches.  Psychiatric/Behavioral: Negative for confusion.   Physical Exam Updated Vital Signs BP (!) 145/71   Pulse 67   Temp 98.4 F (36.9 C) (Oral)   Resp 15   Ht 5\' 7"  (1.702 m)   Wt 67.6 kg (149 lb)   SpO2 94%   BMI 23.34 kg/m   Physical Exam  Constitutional: No distress.  HENT:  Head: Normocephalic.  Right Ear: A middle ear effusion is present.  Left Ear: A middle ear effusion is present.  Nose: Rhinorrhea present. Right sinus  exhibits no maxillary sinus tenderness and no frontal sinus tenderness. Left sinus exhibits no maxillary sinus tenderness and no frontal sinus tenderness.  Mouth/Throat: Uvula is midline and oropharynx is clear and moist.  Eyes: Conjunctivae are normal.  Neck: Normal range of motion. Neck supple. No JVD present.  Cardiovascular: Normal rate, regular rhythm, normal heart sounds and intact distal pulses. Exam reveals no gallop and no friction rub.  No murmur heard. Pulmonary/Chest: Effort normal. No respiratory distress. She has wheezes. She exhibits no tenderness.  Coarse right-sided breath sounds noted to the right middle lung and bases. Scatter expiratory wheezes heard bilaterally.   No tenderness to palpation over the sternum or anterolateral chest wall on the left. No crepitus or step-offs to the left ribs.   Abdominal: Soft. She exhibits no distension and no mass. There is no tenderness. There is no rebound and no guarding. No hernia.  No CVA tenderness bilaterally.   Neurological: She is alert.  Skin: Skin is warm. No rash noted. She is not diaphoretic.  Psychiatric: Her behavior  is normal.  Nursing note and vitals reviewed.  ED Treatments / Results  Labs (all labs ordered are listed, but only abnormal results are displayed) Labs Reviewed  CBC WITH DIFFERENTIAL/PLATELET - Abnormal; Notable for the following components:      Result Value   WBC 12.6 (*)    Lymphs Abs 4.3 (*)    All other components within normal limits  COMPREHENSIVE METABOLIC PANEL - Abnormal; Notable for the following components:   Potassium 3.4 (*)    Chloride 95 (*)    All other components within normal limits  D-DIMER, QUANTITATIVE (NOT AT [facility]) - Abnormal; Notable for the following components:   D-Dimer, Quant 0.67 (*)    All other components within normal limits  CULTURE, BLOOD (ROUTINE X 2)  CULTURE, BLOOD (ROUTINE X 2)  TROPONIN I  LIPASE, BLOOD    EKG  EKG Interpretation  Date/Time:  Saturday March 29 2017 16:12:10 EST Ventricular Rate:  75 PR Interval:    QRS Duration: 85 QT Interval:  429 QTC Calculation: 480 R Axis:   7 Text Interpretation:  Sinus rhythm Abnormal R-wave progression, early transition Nonspecific repol abnormality, lateral leads Baseline wander in lead(s) II III aVR aVF V1 V2 V3 V4 V5 V6 Confirmed by Davonna Belling [phone]) on 03/29/2017 4:28:07 PM       Radiology Dg Chest 2 View  Result Date: 03/29/2017 CLINICAL DATA:  Cough.  COPD. EXAM: CHEST  2 VIEW COMPARISON:  06/12/2016 chest radiograph. FINDINGS: Stable cardiomediastinal silhouette with normal heart size and aortic atherosclerosis. No pneumothorax. No pleural effusion. Hyperinflated lungs. New small bandlike focus of consolidation in the right middle lobe. No pulmonary edema. IMPRESSION: New small bandlike focus of consolidation in the right middle lobe suggesting pneumonia. Recommend follow-up PA and lateral post treatment chest radiographs in 4-6 weeks. Electronically Signed   By: Ilona Sorrel M.D.   On: 03/29/2017 14:55   Dg Foot Complete Right  Result Date: 03/29/2017 CLINICAL DATA:   Twisting injury 2 weeks ago with persistent pain, initial encounter EXAM: RIGHT FOOT COMPLETE - 3+ VIEW COMPARISON:  None. FINDINGS: There is no evidence of fracture or dislocation. There is no evidence of arthropathy or other focal bone abnormality. Soft tissues are unremarkable. IMPRESSION: No acute abnormality noted. Electronically Signed   By: Inez Catalina M.D.   On: 03/29/2017 16:31    Procedures Procedures (including critical care time)  Medications Ordered in ED Medications  acetaminophen (  TYLENOL) tablet 650 mg (not administered)  cefTRIAXone (ROCEPHIN) 1 g in dextrose 5 % 50 mL IVPB (not administered)  azithromycin (ZITHROMAX) tablet 500 mg (not administered)  ipratropium-albuterol (DUONEB) 0.5-2.5 (3) MG/3ML nebulizer solution 3 mL (3 mLs Nebulization Given 03/29/17 1557)     Initial Impression / Assessment and Plan / ED Course  I have reviewed the triage vital signs and the nursing notes.  Pertinent labs & imaging results that were available during my care of the patient were reviewed by me and considered in my medical decision making (see chart for details).     66 year old female with a history of COPD, HTN, and DM Type II who presents to the emergency department with a chief complaint of acute left-sided chest pain, onset this morning. She has chronic dyspnea and right foot pain secondary to a mechanical injury, X-ray negative for fracture. CXR suggestive of RIGHT middle lobe pneumonia, however, this does not explains the patient's LEFT sided chest discomfort, which is not reproducible on exam. Ceftriaxone and azithromycin initiated in the ED. EKG with no acute changes including ST segment elevation. Troponin, d-dimer, CMP, Lipase, Blood Cultures x2 are pending.  The patient was discussed with attending physician Dr. Alvino Chapel, who is in agreement with the workup and plan at this time. Patient care transferred to PA Idol at the end of my shift. Patient presentation, ED course, and  plan of care discussed with review of all pertinent labs and imaging. Please see his/her note for further details regarding further ED course and disposition.     Final diagnoses:  None    ED Discharge Orders    None       Joanne Gavel, PA-C 03/29/17 1740    Davonna Belling, MD 03/30/17 0000

## 2017-04-03 LAB — CULTURE, BLOOD (ROUTINE X 2)
CULTURE: NO GROWTH
CULTURE: NO GROWTH
SPECIAL REQUESTS: ADEQUATE
Special Requests: ADEQUATE

## 2017-04-08 ENCOUNTER — Emergency Department (HOSPITAL_COMMUNITY)
Admission: EM | Admit: 2017-04-08 | Discharge: 2017-04-08 | Disposition: A | Discharge status: Home / Self Care | Payer: Self-pay | Attending: Emergency Medicine | Admitting: Emergency Medicine

## 2017-04-08 ENCOUNTER — Emergency Department (HOSPITAL_COMMUNITY): Payer: Self-pay

## 2017-04-08 ENCOUNTER — Encounter (HOSPITAL_COMMUNITY): Payer: Self-pay | Admitting: Emergency Medicine

## 2017-04-08 DIAGNOSIS — Z79899 Other long term (current) drug therapy: Secondary | ICD-10-CM | POA: Insufficient documentation

## 2017-04-08 DIAGNOSIS — S62613A Displaced fracture of proximal phalanx of left middle finger, initial encounter for closed fracture: Secondary | ICD-10-CM | POA: Insufficient documentation

## 2017-04-08 DIAGNOSIS — Z7984 Long term (current) use of oral hypoglycemic drugs: Secondary | ICD-10-CM | POA: Insufficient documentation

## 2017-04-08 DIAGNOSIS — F17210 Nicotine dependence, cigarettes, uncomplicated: Secondary | ICD-10-CM | POA: Insufficient documentation

## 2017-04-08 DIAGNOSIS — E119 Type 2 diabetes mellitus without complications: Secondary | ICD-10-CM | POA: Insufficient documentation

## 2017-04-08 DIAGNOSIS — Y939 Activity, unspecified: Secondary | ICD-10-CM | POA: Insufficient documentation

## 2017-04-08 DIAGNOSIS — Y929 Unspecified place or not applicable: Secondary | ICD-10-CM | POA: Insufficient documentation

## 2017-04-08 DIAGNOSIS — Y998 Other external cause status: Secondary | ICD-10-CM | POA: Insufficient documentation

## 2017-04-08 DIAGNOSIS — I10 Essential (primary) hypertension: Secondary | ICD-10-CM | POA: Insufficient documentation

## 2017-04-08 DIAGNOSIS — W502XXA Accidental twist by another person, initial encounter: Secondary | ICD-10-CM | POA: Insufficient documentation

## 2017-04-08 MED ORDER — ACETAMINOPHEN 325 MG PO TABS
650.0000 mg | ORAL_TABLET | Freq: Once | ORAL | Status: AC
Start: 2017-04-08 — End: 2017-04-08
  Administered 2017-04-08: 650 mg via ORAL
  Filled 2017-04-08: qty 2

## 2017-04-08 MED ORDER — HYDROCODONE-ACETAMINOPHEN 7.5-325 MG PO TABS: 1.0000 | ORAL_TABLET | Freq: Four times a day (QID) | ORAL | 0 refills | Status: DC | PRN
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
## 2017-04-08 NOTE — Discharge Instructions (Signed)
Elevate and apply ice packs on and off to your finger.  Keep it splinted.  Call Dr. Ruthe Mannan office to arrange a follow-up appointment
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
## 2017-04-10 ENCOUNTER — Other Ambulatory Visit: Payer: Self-pay | Admitting: Orthopedic Surgery

## 2017-04-10 ENCOUNTER — Telehealth: Payer: Self-pay | Admitting: Orthopaedic Surgery

## 2017-04-10 ENCOUNTER — Ambulatory Visit: Payer: Self-pay | Admitting: Orthopaedic Surgery

## 2017-04-10 DIAGNOSIS — S62613A Displaced fracture of proximal phalanx of left middle finger, initial encounter for closed fracture: Secondary | ICD-10-CM | POA: Insufficient documentation

## 2017-04-10 NOTE — Telephone Encounter (Signed)
Patient's appointment at our clinic has been cancelled for today, 04/10/17, per Dr Brooke Bonito, and Dr Ruthe Mannan review.  Advised orthopaedic hand surgeon, as our clinic does not treat this medical issue.  Contact information given to patient for her to call the hand surgeon's office directly; patient voices understanding.

## 2017-04-11 ENCOUNTER — Ambulatory Visit (HOSPITAL_BASED_OUTPATIENT_CLINIC_OR_DEPARTMENT_OTHER)
Admission: RE | Admit: 2017-04-11 | Discharge: 2017-04-11 | Disposition: A | Discharge status: Home / Self Care | Payer: Medicaid Other | Source: Ambulatory Visit | Attending: Orthopedic Surgery | Admitting: Orthopedic Surgery

## 2017-04-11 ENCOUNTER — Encounter (HOSPITAL_BASED_OUTPATIENT_CLINIC_OR_DEPARTMENT_OTHER): Payer: Self-pay | Admitting: *Deleted

## 2017-04-11 ENCOUNTER — Ambulatory Visit (HOSPITAL_BASED_OUTPATIENT_CLINIC_OR_DEPARTMENT_OTHER): Payer: Medicaid Other | Admitting: Anesthesiology

## 2017-04-11 ENCOUNTER — Other Ambulatory Visit: Payer: Self-pay

## 2017-04-11 ENCOUNTER — Encounter (HOSPITAL_BASED_OUTPATIENT_CLINIC_OR_DEPARTMENT_OTHER)
Admission: RE | Disposition: A | Discharge status: Home / Self Care | Payer: Self-pay | Source: Ambulatory Visit | Attending: Orthopedic Surgery

## 2017-04-11 DIAGNOSIS — S62613A Displaced fracture of proximal phalanx of left middle finger, initial encounter for closed fracture: Secondary | ICD-10-CM | POA: Insufficient documentation

## 2017-04-11 DIAGNOSIS — X501XXA Overexertion from prolonged static or awkward postures, initial encounter: Secondary | ICD-10-CM | POA: Diagnosis not present

## 2017-04-11 DIAGNOSIS — Z8249 Family history of ischemic heart disease and other diseases of the circulatory system: Secondary | ICD-10-CM | POA: Insufficient documentation

## 2017-04-11 DIAGNOSIS — M1990 Unspecified osteoarthritis, unspecified site: Secondary | ICD-10-CM | POA: Insufficient documentation

## 2017-04-11 DIAGNOSIS — Z7984 Long term (current) use of oral hypoglycemic drugs: Secondary | ICD-10-CM | POA: Diagnosis not present

## 2017-04-11 DIAGNOSIS — Z833 Family history of diabetes mellitus: Secondary | ICD-10-CM | POA: Diagnosis not present

## 2017-04-11 DIAGNOSIS — F17210 Nicotine dependence, cigarettes, uncomplicated: Secondary | ICD-10-CM | POA: Diagnosis not present

## 2017-04-11 DIAGNOSIS — F329 Major depressive disorder, single episode, unspecified: Secondary | ICD-10-CM | POA: Diagnosis not present

## 2017-04-11 DIAGNOSIS — I10 Essential (primary) hypertension: Secondary | ICD-10-CM | POA: Insufficient documentation

## 2017-04-11 DIAGNOSIS — E119 Type 2 diabetes mellitus without complications: Secondary | ICD-10-CM | POA: Diagnosis not present

## 2017-04-11 DIAGNOSIS — Z79899 Other long term (current) drug therapy: Secondary | ICD-10-CM | POA: Insufficient documentation

## 2017-04-11 HISTORY — PX: OPEN REDUCTION INTERNAL FIXATION (ORIF) PROXIMAL PHALANX: SHX6235

## 2017-04-11 LAB — GLUCOSE, CAPILLARY
Glucose-Capillary: 119 mg/dL — ABNORMAL HIGH (ref 65–99)
Glucose-Capillary: 129 mg/dL — ABNORMAL HIGH (ref 65–99)

## 2017-04-11 SURGERY — OPEN REDUCTION INTERNAL FIXATION (ORIF) PROXIMAL PHALANX
Anesthesia: General | Site: Finger | Laterality: Left

## 2017-04-11 MED ORDER — SCOPOLAMINE 1 MG/3DAYS TD PT72: 1.0000 | MEDICATED_PATCH | Freq: Once | TRANSDERMAL | Status: DC | PRN

## 2017-04-11 MED ORDER — DEXAMETHASONE SODIUM PHOSPHATE 10 MG/ML IJ SOLN
INTRAMUSCULAR | Status: AC
  Filled 2017-04-11: qty 1

## 2017-04-11 MED ORDER — CEFAZOLIN SODIUM-DEXTROSE 2-4 GM/100ML-% IV SOLN
2.0000 g | INTRAVENOUS | Status: AC
  Administered 2017-04-11: 2 g via INTRAVENOUS

## 2017-04-11 MED ORDER — PROPOFOL 500 MG/50ML IV EMUL
INTRAVENOUS | Status: AC
  Filled 2017-04-11: qty 50

## 2017-04-11 MED ORDER — LIDOCAINE HCL (CARDIAC) 20 MG/ML IV SOLN
INTRAVENOUS | Status: DC | PRN
  Administered 2017-04-11: 50 mg via INTRAVENOUS

## 2017-04-11 MED ORDER — BUPIVACAINE HCL (PF) 0.25 % IJ SOLN
INTRAMUSCULAR | Status: DC | PRN
  Administered 2017-04-11: 10 mL

## 2017-04-11 MED ORDER — LACTATED RINGERS IV SOLN
INTRAVENOUS | Status: DC
  Administered 2017-04-11 (×2): via INTRAVENOUS

## 2017-04-11 MED ORDER — EPHEDRINE SULFATE 50 MG/ML IJ SOLN
INTRAMUSCULAR | Status: DC | PRN
  Administered 2017-04-11: 15 mg via INTRAVENOUS

## 2017-04-11 MED ORDER — CHLORHEXIDINE GLUCONATE 4 % EX LIQD: 60.0000 mL | Freq: Once | CUTANEOUS | Status: DC

## 2017-04-11 MED ORDER — LIDOCAINE 2% (20 MG/ML) 5 ML SYRINGE
INTRAMUSCULAR | Status: AC
  Filled 2017-04-11: qty 5

## 2017-04-11 MED ORDER — METOCLOPRAMIDE HCL 5 MG/ML IJ SOLN: 10.0000 mg | Freq: Once | INTRAMUSCULAR | Status: DC | PRN

## 2017-04-11 MED ORDER — LACTATED RINGERS IV SOLN: INTRAVENOUS | Status: DC

## 2017-04-11 MED ORDER — PROPOFOL 10 MG/ML IV BOLUS
INTRAVENOUS | Status: DC | PRN
  Administered 2017-04-11: 100 mg via INTRAVENOUS

## 2017-04-11 MED ORDER — ONDANSETRON HCL 4 MG/2ML IJ SOLN
INTRAMUSCULAR | Status: AC
  Filled 2017-04-11: qty 2

## 2017-04-11 MED ORDER — DEXAMETHASONE SODIUM PHOSPHATE 10 MG/ML IJ SOLN
INTRAMUSCULAR | Status: DC | PRN
  Administered 2017-04-11: 5 mg via INTRAVENOUS

## 2017-04-11 MED ORDER — EPHEDRINE 5 MG/ML INJ
INTRAVENOUS | Status: AC
  Filled 2017-04-11: qty 10

## 2017-04-11 MED ORDER — MIDAZOLAM HCL 2 MG/2ML IJ SOLN: 1.0000 mg | INTRAMUSCULAR | Status: DC | PRN

## 2017-04-11 MED ORDER — FENTANYL CITRATE (PF) 100 MCG/2ML IJ SOLN
50.0000 ug | INTRAMUSCULAR | Status: DC | PRN
  Administered 2017-04-11: 50 ug via INTRAVENOUS

## 2017-04-11 MED ORDER — SUCCINYLCHOLINE CHLORIDE 200 MG/10ML IV SOSY
PREFILLED_SYRINGE | INTRAVENOUS | Status: AC
  Filled 2017-04-11: qty 10

## 2017-04-11 MED ORDER — CEFAZOLIN SODIUM-DEXTROSE 2-4 GM/100ML-% IV SOLN
INTRAVENOUS | Status: AC
  Filled 2017-04-11: qty 100

## 2017-04-11 MED ORDER — OXYCODONE-ACETAMINOPHEN 5-325 MG PO TABS: 1.0000 | ORAL_TABLET | ORAL | 0 refills | Status: DC | PRN

## 2017-04-11 MED ORDER — MEPERIDINE HCL 25 MG/ML IJ SOLN: 6.2500 mg | INTRAMUSCULAR | Status: DC | PRN

## 2017-04-11 MED ORDER — FENTANYL CITRATE (PF) 100 MCG/2ML IJ SOLN
INTRAMUSCULAR | Status: AC
  Filled 2017-04-11: qty 2

## 2017-04-11 MED ORDER — MIDAZOLAM HCL 2 MG/2ML IJ SOLN
INTRAMUSCULAR | Status: AC
  Filled 2017-04-11: qty 2

## 2017-04-11 MED ORDER — FENTANYL CITRATE (PF) 100 MCG/2ML IJ SOLN: 25.0000 ug | INTRAMUSCULAR | Status: DC | PRN

## 2017-04-11 MED ORDER — ONDANSETRON HCL 4 MG/2ML IJ SOLN
INTRAMUSCULAR | Status: DC | PRN
  Administered 2017-04-11: 4 mg via INTRAVENOUS

## 2017-04-11 MED ORDER — PHENYLEPHRINE 40 MCG/ML (10ML) SYRINGE FOR IV PUSH (FOR BLOOD PRESSURE SUPPORT)
PREFILLED_SYRINGE | INTRAVENOUS | Status: AC
  Filled 2017-04-11: qty 10

## 2017-04-11 SURGICAL SUPPLY — 70 items
APL SKNCLS STERI-STRIP NONHPOA (GAUZE/BANDAGES/DRESSINGS)
BANDAGE ACE 3X5.8 VEL STRL LF (GAUZE/BANDAGES/DRESSINGS) IMPLANT
BANDAGE ACE 4X5 VEL STRL LF (GAUZE/BANDAGES/DRESSINGS) ×3 IMPLANT
BENZOIN TINCTURE PRP APPL 2/3 (GAUZE/BANDAGES/DRESSINGS) IMPLANT
BIT DRILL 1.1 MINI (BIT) ×1 IMPLANT
BLADE SURG 15 STRL LF DISP TIS (BLADE) ×1 IMPLANT
BLADE SURG 15 STRL SS (BLADE) ×3
BNDG CMPR 9X4 STRL LF SNTH (GAUZE/BANDAGES/DRESSINGS) ×1
BNDG ELASTIC 2X5.8 VLCR STR LF (GAUZE/BANDAGES/DRESSINGS) IMPLANT
BNDG ESMARK 4X9 LF (GAUZE/BANDAGES/DRESSINGS) ×3 IMPLANT
BNDG GAUZE ELAST 4 BULKY (GAUZE/BANDAGES/DRESSINGS) ×3 IMPLANT
CANISTER SUCT 1200ML W/VALVE (MISCELLANEOUS) IMPLANT
CLOSURE WOUND 1/2 X4 (GAUZE/BANDAGES/DRESSINGS)
CORD BIPOLAR FORCEPS 12FT (ELECTRODE) ×3 IMPLANT
COVER BACK TABLE 60X90IN (DRAPES) ×3 IMPLANT
CUFF TOURNIQUET SINGLE 18IN (TOURNIQUET CUFF) ×3 IMPLANT
DECANTER SPIKE VIAL GLASS SM (MISCELLANEOUS) IMPLANT
DRAPE EXTREMITY T 121X128X90 (DRAPE) ×3 IMPLANT
DRAPE OEC MINIVIEW 54X84 (DRAPES) ×3 IMPLANT
DRAPE SURG 17X23 STRL (DRAPES) ×3 IMPLANT
DRILL BIT 1.1 MINI (BIT) ×3
DURAPREP 26ML APPLICATOR (WOUND CARE) ×3 IMPLANT
GAUZE SPONGE 4X4 12PLY STRL (GAUZE/BANDAGES/DRESSINGS) ×3 IMPLANT
GAUZE SPONGE 4X4 16PLY XRAY LF (GAUZE/BANDAGES/DRESSINGS) IMPLANT
GAUZE XEROFORM 1X8 LF (GAUZE/BANDAGES/DRESSINGS) IMPLANT
GLOVE BIO SURGEON STRL SZ 6.5 (GLOVE) ×2 IMPLANT
GLOVE BIO SURGEONS STRL SZ 6.5 (GLOVE) ×1
GLOVE BIOGEL PI IND STRL 7.0 (GLOVE) ×1 IMPLANT
GLOVE BIOGEL PI INDICATOR 7.0 (GLOVE) ×2
GLOVE SURG SYN 8.0 (GLOVE) ×3 IMPLANT
GOWN STRL REUS W/ TWL LRG LVL3 (GOWN DISPOSABLE) ×1 IMPLANT
GOWN STRL REUS W/TWL LRG LVL3 (GOWN DISPOSABLE) ×3
GOWN STRL REUS W/TWL XL LVL3 (GOWN DISPOSABLE) ×6 IMPLANT
NEEDLE HYPO 25X1 1.5 SAFETY (NEEDLE) ×3 IMPLANT
NS IRRIG 1000ML POUR BTL (IV SOLUTION) IMPLANT
PACK BASIN DAY SURGERY FS (CUSTOM PROCEDURE TRAY) ×3 IMPLANT
PAD CAST 3X4 CTTN HI CHSV (CAST SUPPLIES) ×1 IMPLANT
PAD CAST 4YDX4 CTTN HI CHSV (CAST SUPPLIES) IMPLANT
PADDING CAST ABS 4INX4YD NS (CAST SUPPLIES) ×2
PADDING CAST ABS COTTON 4X4 ST (CAST SUPPLIES) ×1 IMPLANT
PADDING CAST COTTON 3X4 STRL (CAST SUPPLIES) ×3
PADDING CAST COTTON 4X4 STRL (CAST SUPPLIES)
PADDING UNDERCAST 2 STRL (CAST SUPPLIES)
PADDING UNDERCAST 2X4 STRL (CAST SUPPLIES) IMPLANT
PLATE T 1.3 3H HEAD/8H SFT (Plate) ×1 IMPLANT
PLATE-T 1.3 3H HEAD/8H SFT (Plate) ×3 IMPLANT
SCREW SELF TAP CORTEX 1.3 10MM (Screw) ×3 IMPLANT
SCREW SELF TAP CORTEX 1.3 7MM (Screw) ×6 IMPLANT
SCREW SELF TAP CORTEX 1.3 8MM (Screw) ×12 IMPLANT
SHEET MEDIUM DRAPE 40X70 STRL (DRAPES) ×3 IMPLANT
SPLINT PLASTER CAST XFAST 4X15 (CAST SUPPLIES) ×15 IMPLANT
SPLINT PLASTER XTRA FAST SET 4 (CAST SUPPLIES) ×30
STOCKINETTE 4X48 STRL (DRAPES) ×3 IMPLANT
STRIP CLOSURE SKIN 1/2X4 (GAUZE/BANDAGES/DRESSINGS) IMPLANT
SUCTION FRAZIER HANDLE 10FR (MISCELLANEOUS)
SUCTION TUBE FRAZIER 10FR DISP (MISCELLANEOUS) IMPLANT
SUT ETHILON 4 0 PS 2 18 (SUTURE) ×3 IMPLANT
SUT ETHILON 5 0 PS 2 18 (SUTURE) IMPLANT
SUT MERSILENE 4 0 P 3 (SUTURE) IMPLANT
SUT VIC AB 4-0 P-3 18XBRD (SUTURE) IMPLANT
SUT VIC AB 4-0 P3 18 (SUTURE)
SUT VICRYL 4-0 PS2 18IN ABS (SUTURE) ×3 IMPLANT
SUT VICRYL RAPIDE 4-0 (SUTURE) IMPLANT
SUT VICRYL RAPIDE 4/0 PS 2 (SUTURE) IMPLANT
SYR 10ML LL (SYRINGE) ×3 IMPLANT
SYR BULB 3OZ (MISCELLANEOUS) IMPLANT
TOWEL OR 17X24 6PK STRL BLUE (TOWEL DISPOSABLE) ×3 IMPLANT
TUBE CONNECTING 20'X1/4 (TUBING)
TUBE CONNECTING 20X1/4 (TUBING) IMPLANT
UNDERPAD 30X30 (UNDERPADS AND DIAPERS) ×3 IMPLANT

## 2017-04-11 NOTE — Anesthesia Preprocedure Evaluation (Signed)
Anesthesia Evaluation  Patient identified by MRN, date of birth, ID band Patient awake    Reviewed: Allergy & Precautions, NPO status , Patient's Chart, lab work & pertinent test results  Airway Mallampati: II  TM Distance: >3 FB Neck ROM: Full    Dental no notable dental hx. (+) Edentulous Upper, Edentulous Lower   Pulmonary COPD,  COPD inhaler, Current Smoker,    Pulmonary exam normal breath sounds clear to auscultation       Cardiovascular hypertension, Pt. on medications and Pt. on home beta blockers Normal cardiovascular exam Rhythm:Regular Rate:Normal     Neuro/Psych negative neurological ROS  negative psych ROS   GI/Hepatic negative GI ROS, Neg liver ROS,   Endo/Other  diabetes, Type 2, Oral Hypoglycemic Agents  Renal/GU negative Renal ROS  negative genitourinary   Musculoskeletal negative musculoskeletal ROS (+)   Abdominal   Peds negative pediatric ROS (+)  Hematology negative hematology ROS (+)   Anesthesia Other Findings   Reproductive/Obstetrics negative OB ROS                            Anesthesia Physical Anesthesia Plan  ASA: III  Anesthesia Plan: General   Post-op Pain Management:    Induction:   PONV Risk Score and Plan: 2 and Ondansetron and Treatment may vary due to age or medical condition  Airway Management Planned: LMA  Additional Equipment:   Intra-op Plan:   Post-operative Plan:   Informed Consent: I have reviewed the patients History and Physical, chart, labs and discussed the procedure including the risks, benefits and alternatives for the proposed anesthesia with the patient or authorized representative who has indicated his/her understanding and acceptance.   Dental advisory given  Plan Discussed with:   Anesthesia Plan Comments:         Anesthesia Quick Evaluation

## 2017-04-11 NOTE — Discharge Instructions (Signed)

## 2017-04-11 NOTE — Transfer of Care (Signed)
Immediate Anesthesia Transfer of Care Note  Patient: Holly May  Procedure(s) Performed: OPEN REDUCTION INTERNAL FIXATION (ORIF) PROXIMAL PHALANX (Left Finger)  Patient Location: PACU  Anesthesia Type:General  Level of Consciousness: awake  Airway & Oxygen Therapy: Patient Spontanous Breathing and Patient connected to face mask oxygen  Post-op Assessment: Report given to RN and Post -op Vital signs reviewed and stable  Post vital signs: Reviewed and stable  Last Vitals:  Vitals:   04/11/17 1238 04/11/17 1519  BP: (!) 152/69 128/67  Pulse: 64 72  Resp: 18 14  Temp: (!) 36.4 C   SpO2: 100% 100%    Last Pain:  Vitals:   04/11/17 1238  TempSrc: Oral  PainSc: 8          Complications: No apparent anesthesia complications

## 2017-04-11 NOTE — Op Note (Signed)
Please see operative report [phone]

## 2017-04-11 NOTE — H&P (Signed)
Holly May is an 67 y.o. female.   Chief Complaint: Left long finger pain, swelling, and deformity. HPI: Patient's a very pleasant 67 year old female who is status post a twisting injury to her left long finger with radiographic evidence of a displaced proximal phalangeal fracture.  Past Medical History:  Diagnosis Date  . Arthritis   . Depression   . Essential hypertension, benign   . Type 2 diabetes mellitus ([place])   . Urinary incontinence     Past Surgical History:  Procedure Laterality Date  . CHOLECYSTECTOMY      Family History  Problem Relation Age of Onset  . Diabetes Mellitus II Sister   . Diabetes Mellitus II Mother   . Hypertension Brother   . Hypertension Sister    Social History:  reports that she has been smoking cigarettes.  She started smoking about 46 years ago. She has a 43.00 pack-year smoking history. she has never used smokeless tobacco. She reports that she drinks alcohol. She reports that she does not use drugs.  Allergies: No Known Allergies  Medications Prior to Admission  Medication Sig Dispense Refill  . albuterol (PROVENTIL HFA;VENTOLIN HFA) 108 (90 BASE) MCG/ACT inhaler Inhale 2 puffs into the lungs every 6 (six) hours as needed for wheezing.    Marland Kitchen albuterol (PROVENTIL HFA;VENTOLIN HFA) 108 (90 Base) MCG/ACT inhaler Inhale 2 puffs into the lungs every 4 (four) hours as needed for wheezing or shortness of breath (cough, shortness of breath or wheezing.). 1 Inhaler 1  . amLODipine (NORVASC) 10 MG tablet Take 10 mg by mouth daily.    . citalopram (CELEXA) 20 MG tablet Take 1 tablet (20 mg total) by mouth daily. 30 tablet 0  . gabapentin (NEURONTIN) 300 MG capsule Take 600 mg by mouth at bedtime.     . hydrochlorothiazide (HYDRODIURIL) 25 MG tablet Take 25 mg by mouth daily.    Marland Kitchen HYDROcodone-acetaminophen (NORCO) 7.5-325 MG tablet Take 1 tablet by mouth every 6 (six) hours as needed for moderate pain. 15 tablet 0  . lisinopril (PRINIVIL,ZESTRIL) 40 MG  tablet Take 40 mg by mouth daily.    Marland Kitchen lovastatin (MEVACOR) 40 MG tablet Take 40 mg by mouth at bedtime.    . metFORMIN (GLUCOPHAGE) 1000 MG tablet Take 500 mg by mouth daily with breakfast.     . metoprolol succinate (TOPROL-XL) 25 MG 24 hr tablet Take 25 mg by mouth daily.    Marland Kitchen oxybutynin (DITROPAN) 5 MG tablet Take 5 mg by mouth 2 (two) times daily.     . potassium chloride 20 MEQ TBCR Take 20 mEq by mouth daily. 7 tablet 0  . traZODone (DESYREL) 50 MG tablet Take 50 mg by mouth at bedtime.    . Vitamin D, Ergocalciferol, (DRISDOL) 50000 units CAPS capsule Take 50,000 Units by mouth every 7 (seven) days.    Marland Kitchen doxycycline (VIBRAMYCIN) 100 MG capsule Take 1 capsule (100 mg total) by mouth 2 (two) times daily. 20 capsule 0    Results for orders placed or performed during the hospital encounter of 04/11/17 (from the past 48 hour(s))  Glucose, capillary     Status: Abnormal   Collection Time: 04/11/17 12:56 PM  Result Value Ref Range   Glucose-Capillary 119 (H) 65 - 99 mg/dL   No results found.  Review of Systems  All other systems reviewed and are negative.   Blood pressure (!) 152/69, pulse 64, temperature (!) 97.5 F (36.4 C), temperature source Oral, resp. rate 18, height 5\' 8"  (  1.727 m), weight 67.9 kg (149 lb 9.6 oz), SpO2 100 %. Physical Exam  Constitutional: She is oriented to person, place, and time. She appears well-developed and well-nourished.  HENT:  Head: Normocephalic and atraumatic.  Neck: Normal range of motion.  Cardiovascular: Normal rate.  Respiratory: Effort normal.  Musculoskeletal:       Left hand: She exhibits tenderness, bony tenderness, deformity and swelling.  Left long finger pain, swelling, and deformity  Neurological: She is alert and oriented to person, place, and time.  Skin: Skin is warm.  Psychiatric: She has a normal mood and affect. Her behavior is normal. Judgment and thought content normal.     Assessment/Plan 67 year old female with a  displaced fracture of her left long finger proximal phalanx with angulation and deformity. Have discussed operative fixation of this fracture. Patient understands the risks and benefits and wishes to proceed.  Holly Amor, MD 04/11/2017, 1:21 PM

## 2017-04-11 NOTE — Anesthesia Procedure Notes (Signed)
Procedure Name: LMA Insertion Date/Time: 04/11/2017 1:51 PM Performed by: Willa Frater, CRNA Pre-anesthesia Checklist: Patient identified, Emergency Drugs available, Suction available and Patient being monitored Patient Re-evaluated:Patient Re-evaluated prior to induction Oxygen Delivery Method: Circle system utilized Preoxygenation: Pre-oxygenation with 100% oxygen Induction Type: IV induction Ventilation: Mask ventilation without difficulty LMA: LMA inserted LMA Size: 4.0 Number of attempts: 1 Airway Equipment and Method: Bite block Placement Confirmation: positive ETCO2 Tube secured with: Tape Dental Injury: Teeth and Oropharynx as per pre-operative assessment

## 2017-04-12 NOTE — Op Note (Deleted)
  The note originally documented on this encounter has been moved the the encounter in which it belongs.  

## 2017-04-12 NOTE — Op Note (Signed)
NAMEALEXYSS, BALZARINI                 ACCOUNT NO.:  [id number]  MEDICAL RECORD NO.:  [id number]  LOCATION:  [id number]                        FACILITY:  [facility]  PHYSICIAN:  Sheral Apley. Khamora Karan, M.D.DATE OF BIRTH:  04-19-1950  DATE OF PROCEDURE:  04/11/2017 DATE OF DISCHARGE:                              OPERATIVE REPORT   PREOPERATIVE DIAGNOSIS:  Displaced left long finger proximal phalangeal fracture.  POSTOPERATIVE DIAGNOSIS:  Displaced left long finger proximal phalangeal fracture.  PROCEDURE:  Open reduction and internal fixation of displaced intra- articular fracture, proximal phalanx, left long finger.  SURGEON:  Sheral Apley. Burney Gauze, MD.  ASSISTANT:  Marily Lente Dasnoit, PA.  ANESTHESIA:  General.  COMPLICATION:  None.  DRAINS:  None.  The patient was taken to the operating suite after induction of adequate general anesthetic.  The left upper extremity was prepped and draped in a sterile fashion.  An Esmarch was used to exsanguinate the limb.  The tourniquet was then inflated to 250 mmHg.  At this point in time, an incision was made over the proximal interphalangeal joint and proximal phalanx of the left long finger.  A large radially-based flap was elevated.  The extensor mechanism was incised and the periosteum was split.  Dissection revealed a distal third proximal phalangeal fracture, long spiral oblique with the distal fragment extending into the joint. We were able to achieve reduction with longitudinal traction and upward pressure of the distal fragment.  This was held reduced with a phalangeal clamp.  A single lag screw was placed from ulnar to radial transversely to the long axis of the proximal phalanx under direct fluoroscopic guidance.  Once this was done to provisionally stabilize the fracture, a T-plate was placed 1.3 mm from the modular handset screw with 3 screws distal to the fracture and 3 screws proximal to the fracture under direct and fluoroscopic  guidance.  Fluoroscopic imaging then revealed adequate reduction of the fracture and reduction of the joint.  The wound was thoroughly irrigated.  We closed the periosteum with 4-0 Vicryl, the extensor mechanism with 4-0 FiberWire, and the skin with 4-0 nylon.  Xeroform, 4 x 4's, and an ulnar gutter splint were applied.  The patient tolerated the procedure well, went to the recovery room in stable fashion.     Sheral Apley Burney Gauze, M.D.     MAW/MEDQ  D:  04/11/2017  T:  04/12/2017  Job:  [id number]

## 2017-04-14 ENCOUNTER — Encounter (HOSPITAL_BASED_OUTPATIENT_CLINIC_OR_DEPARTMENT_OTHER): Payer: Self-pay | Admitting: Orthopedic Surgery

## 2017-04-14 NOTE — Anesthesia Postprocedure Evaluation (Signed)
Anesthesia Post Note  Patient: Atilano Ina  Procedure(s) Performed: OPEN REDUCTION INTERNAL FIXATION (ORIF) PROXIMAL PHALANX (Left Finger)     Patient location during evaluation: PACU Anesthesia Type: General Level of consciousness: awake and alert Pain management: pain level controlled Vital Signs Assessment: post-procedure vital signs reviewed and stable Respiratory status: spontaneous breathing, nonlabored ventilation, respiratory function stable and patient connected to nasal cannula oxygen Cardiovascular status: blood pressure returned to baseline and stable Postop Assessment: no apparent nausea or vomiting Anesthetic complications: no    Last Vitals:  Vitals:   04/11/17 1545 04/11/17 1558  BP: 138/62 (!) 156/56  Pulse: 64 61  Resp: 14 18  Temp:  36.4 C  SpO2: 100% 95%    Last Pain:  Vitals:   04/11/17 1558  TempSrc: Oral  PainSc: 0-No pain                 Montez Hageman

## 2017-07-26 ENCOUNTER — Emergency Department (HOSPITAL_COMMUNITY)
Admission: EM | Admit: 2017-07-26 | Discharge: 2017-07-26 | Disposition: A | Discharge status: Home / Self Care | Payer: Medicaid Other | Attending: Emergency Medicine | Admitting: Emergency Medicine

## 2017-07-26 ENCOUNTER — Encounter (HOSPITAL_COMMUNITY): Payer: Self-pay | Admitting: Emergency Medicine

## 2017-07-26 ENCOUNTER — Emergency Department (HOSPITAL_COMMUNITY): Payer: Medicaid Other

## 2017-07-26 ENCOUNTER — Other Ambulatory Visit: Payer: Self-pay

## 2017-07-26 DIAGNOSIS — Y999 Unspecified external cause status: Secondary | ICD-10-CM | POA: Diagnosis not present

## 2017-07-26 DIAGNOSIS — F10920 Alcohol use, unspecified with intoxication, uncomplicated: Secondary | ICD-10-CM | POA: Insufficient documentation

## 2017-07-26 DIAGNOSIS — Z7984 Long term (current) use of oral hypoglycemic drugs: Secondary | ICD-10-CM | POA: Diagnosis not present

## 2017-07-26 DIAGNOSIS — S0101XA Laceration without foreign body of scalp, initial encounter: Secondary | ICD-10-CM | POA: Diagnosis present

## 2017-07-26 DIAGNOSIS — Z79899 Other long term (current) drug therapy: Secondary | ICD-10-CM | POA: Diagnosis not present

## 2017-07-26 DIAGNOSIS — Y929 Unspecified place or not applicable: Secondary | ICD-10-CM | POA: Insufficient documentation

## 2017-07-26 DIAGNOSIS — E119 Type 2 diabetes mellitus without complications: Secondary | ICD-10-CM | POA: Diagnosis not present

## 2017-07-26 DIAGNOSIS — I10 Essential (primary) hypertension: Secondary | ICD-10-CM | POA: Diagnosis not present

## 2017-07-26 DIAGNOSIS — W19XXXA Unspecified fall, initial encounter: Secondary | ICD-10-CM | POA: Diagnosis not present

## 2017-07-26 DIAGNOSIS — Y9389 Activity, other specified: Secondary | ICD-10-CM | POA: Insufficient documentation

## 2017-07-26 MED ORDER — LIDOCAINE-EPINEPHRINE (PF) 2 %-1:200000 IJ SOLN
10.0000 mL | Freq: Once | INTRAMUSCULAR | Status: AC
  Administered 2017-07-26: 10 mL
  Filled 2017-07-26: qty 20
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
## 2017-07-26 NOTE — ED Provider Notes (Signed)
[facility] EMERGENCY DEPARTMENT Provider Note   CSN: [id number] Arrival date & time: 07/26/17  1953     History   Chief Complaint Chief Complaint  Patient presents with  . Fall    HPI Holly May is a 67 y.o. female.  HPI 67 y.o. Female with fall today after drinking alcohol.  Laceration to right scalp.  Family called ems.  Patient unable to give any other history.  Past Medical History:  Diagnosis Date  . Arthritis   . Depression   . Essential hypertension, benign   . Type 2 diabetes mellitus ([place])   . Urinary incontinence     Patient Active Problem List   Diagnosis Date Noted  . Acute on chronic respiratory failure ([place]) 06/12/2016  . Acute exacerbation of chronic obstructive pulmonary disease (COPD) ([place]) 06/12/2016  . Elevated troponin 06/12/2016  . Hypokalemia 06/12/2016  . Acute renal failure (ARF) ([place]) 06/12/2016  . Preoperative cardiovascular examination 05/18/2014  . Abnormal ECG 05/18/2014  . Humerus shaft fracture 04/06/2013  . Closed fracture of unspecified part of upper end of humerus 04/06/2013  . Syncope 03/31/2013  . Fracture of left humerus 03/31/2013  . DM type 2 (diabetes mellitus, type 2) ([place]) 03/31/2013  . HTN (hypertension) 03/31/2013  . Heme positive stool 03/31/2013  . Normocytic anemia 03/31/2013  . Tobacco abuse 03/31/2013    Past Surgical History:  Procedure Laterality Date  . CHOLECYSTECTOMY    . OPEN REDUCTION INTERNAL FIXATION (ORIF) PROXIMAL PHALANX Left 04/11/2017   Procedure: OPEN REDUCTION INTERNAL FIXATION (ORIF) PROXIMAL PHALANX;  Surgeon: Charlotte Crumb, MD;  Location: [facility];  Service: Orthopedics;  Laterality: Left;     OB History   None      Home Medications    Prior to Admission medications   Medication Sig Start Date End Date Taking? Authorizing Provider  amLODipine (NORVASC) 10 MG tablet Take 10 mg by mouth daily.   Yes [provider]  citalopram (CELEXA) 20 MG tablet  Take 1 tablet (20 mg total) by mouth daily. 06/14/16  Yes Johnson, Clanford L, MD  gabapentin (NEURONTIN) 300 MG capsule Take 600 mg by mouth at bedtime.    Yes [provider]  hydrochlorothiazide (HYDRODIURIL) 25 MG tablet Take 25 mg by mouth daily.   Yes [provider]  lisinopril (PRINIVIL,ZESTRIL) 40 MG tablet Take 40 mg by mouth daily.   Yes [provider]  lovastatin (MEVACOR) 40 MG tablet Take 40 mg by mouth at bedtime.   Yes [provider]  metFORMIN (GLUCOPHAGE) 1000 MG tablet Take 500 mg by mouth daily with breakfast.    Yes [provider]  metoprolol succinate (TOPROL-XL) 25 MG 24 hr tablet Take 25 mg by mouth daily.   Yes [provider]  oxybutynin (DITROPAN) 5 MG tablet Take 5 mg by mouth 3 (three) times daily.    Yes [provider]  Vitamin D, Ergocalciferol, (DRISDOL) 50000 units CAPS capsule Take 50,000 Units by mouth every 7 (seven) days.   Yes [provider]  albuterol (PROVENTIL HFA;VENTOLIN HFA) 108 (90 Base) MCG/ACT inhaler Inhale 2 puffs into the lungs every 4 (four) hours as needed for wheezing or shortness of breath (cough, shortness of breath or wheezing.). 06/14/16   Murlean Iba, MD    Family History Family History  Problem Relation Age of Onset  . Diabetes Mellitus II Sister   . Diabetes Mellitus II Mother   . Hypertension Brother   . Hypertension Sister  Social History Social History   Tobacco Use  . Smoking status: Current Every Day Smoker    Packs/day: 1.00    Years: 43.00    Pack years: 43.00    Types: Cigarettes    Start date: 01/09/1971  . Smokeless tobacco: Never Used  Substance Use Topics  . Alcohol use: Yes    Alcohol/week: 0.0 oz    Comment: sometimes  . Drug use: No     Allergies   Patient has no known allergies.   Review of Systems Review of Systems  All other systems reviewed and are negative.    Physical Exam Updated Vital Signs BP (!)  142/85 (BP Location: Left Arm)   Pulse 69   Temp 97.7 F (36.5 C) (Oral)   Resp 15   SpO2 98%   Physical Exam  Constitutional: She appears well-developed and well-nourished.  HENT:  Head: Normocephalic.  Laceration 3 cm right posterior scalp  Eyes: Pupils are equal, round, and reactive to light. EOM are normal.  Neck: Normal range of motion.  Cardiovascular: Normal rate and regular rhythm.  Pulmonary/Chest: Effort normal and breath sounds normal.  Abdominal: Soft.  Musculoskeletal: Normal range of motion.  Neurological: She is alert.  Skin: Skin is warm and dry.  Psychiatric:  Appears intoxicated   Nursing note and vitals reviewed.    ED Treatments / Results  Labs (all labs ordered are listed, but only abnormal results are displayed) Labs Reviewed - No data to display  EKG None  Radiology Ct Head Wo Contrast  Result Date: 07/26/2017 CLINICAL DATA:  Patient fell at home while drinking alcohol. Right scalp abrasion. EXAM: CT HEAD WITHOUT CONTRAST CT CERVICAL SPINE WITHOUT CONTRAST TECHNIQUE: Multidetector CT imaging of the head and cervical spine was performed following the standard protocol without intravenous contrast. Multiplanar CT image reconstructions of the cervical spine were also generated. COMPARISON:  Head CT 03/31/2013 FINDINGS: CT HEAD FINDINGS Brain: Chronic small vessel ischemic disease. No large vascular territory infarct. No intra-axial mass nor extra-axial fluid collections. There is no hydrocephalus. Midline fourth ventricle and basal cisterns. Vascular: No hyperdense vessel sign. Atherosclerosis of the vertebral, basilar and cavernous internal carotid arteries. Skull: No skull fracture. Sinuses/Orbits: Intact orbits and globes. No acute paranasal sinus disease. Clear mastoids. Other: Small right posterior parietal scalp contusion. CT CERVICAL SPINE FINDINGS Alignment: Maintained cervical lordosis. Intact craniocervical relationship. Mild osteoarthritic joint  space narrowing of the atlantodental interval. Skull base and vertebrae: No acute fracture. No primary bone lesion or focal pathologic process. Soft tissues and spinal canal: No prevertebral fluid or swelling. No visible canal hematoma. Disc levels: No significant disc flattening. No significant central or foraminal stenosis. Facets are normally aligned bilaterally. Upper chest: Clear lung apices. Other: Bilateral extracranial carotid arteriosclerosis at the carotid bulbs. IMPRESSION: 1. Chronic small vessel ischemic disease and atrophy. No acute intracranial abnormality. 2. No acute cervical spine fracture or posttraumatic listhesis Electronically Signed   By: Ashley Royalty M.D.   On: 07/26/2017 21:05   Ct Cervical Spine Wo Contrast  Result Date: 07/26/2017 CLINICAL DATA:  Patient fell at home while drinking alcohol. Right scalp abrasion. EXAM: CT HEAD WITHOUT CONTRAST CT CERVICAL SPINE WITHOUT CONTRAST TECHNIQUE: Multidetector CT imaging of the head and cervical spine was performed following the standard protocol without intravenous contrast. Multiplanar CT image reconstructions of the cervical spine were also generated. COMPARISON:  Head CT 03/31/2013 FINDINGS: CT HEAD FINDINGS Brain: Chronic small vessel ischemic disease. No large vascular territory infarct. No intra-axial mass  nor extra-axial fluid collections. There is no hydrocephalus. Midline fourth ventricle and basal cisterns. Vascular: No hyperdense vessel sign. Atherosclerosis of the vertebral, basilar and cavernous internal carotid arteries. Skull: No skull fracture. Sinuses/Orbits: Intact orbits and globes. No acute paranasal sinus disease. Clear mastoids. Other: Small right posterior parietal scalp contusion. CT CERVICAL SPINE FINDINGS Alignment: Maintained cervical lordosis. Intact craniocervical relationship. Mild osteoarthritic joint space narrowing of the atlantodental interval. Skull base and vertebrae: No acute fracture. No primary bone lesion  or focal pathologic process. Soft tissues and spinal canal: No prevertebral fluid or swelling. No visible canal hematoma. Disc levels: No significant disc flattening. No significant central or foraminal stenosis. Facets are normally aligned bilaterally. Upper chest: Clear lung apices. Other: Bilateral extracranial carotid arteriosclerosis at the carotid bulbs. IMPRESSION: 1. Chronic small vessel ischemic disease and atrophy. No acute intracranial abnormality. 2. No acute cervical spine fracture or posttraumatic listhesis Electronically Signed   By: Ashley Royalty M.D.   On: 07/26/2017 21:05    Procedures .Marland KitchenLaceration Repair Date/Time: 07/26/2017 9:38 PM Performed by: Pattricia Boss, MD Authorized by: Pattricia Boss, MD   Consent:    Consent obtained:  Emergent situation   Consent given by:  Patient   Alternatives discussed:  No treatment Anesthesia (see MAR for exact dosages):    Anesthesia method:  Local infiltration   Local anesthetic:  Lidocaine 2% WITH epi Laceration details:    Location:  Scalp   Scalp location:  Occipital   Length (cm):  3 Exploration:    Contaminated: no   Treatment:    Area cleansed with:  Saline   Amount of cleaning:  Extensive   Irrigation solution:  Sterile saline   Irrigation volume:  100 cc   Irrigation method:  Syringe   Visualized foreign bodies/material removed: no   Skin repair:    Repair method:  Staples   Number of staples:  3 Approximation:    Approximation:  Close Post-procedure details:    Dressing:  Open (no dressing)   (including critical care time)  Medications Ordered in ED Medications  lidocaine-EPINEPHrine (XYLOCAINE W/EPI) 2 %-1:200000 (PF) injection 10 mL (has no administration in time range)     Initial Impression / Assessment and Plan / ED Course  I have reviewed the triage vital signs and the nursing notes.  Pertinent labs & imaging results that were available during my care of the patient were reviewed by me and considered  in my medical decision making (see chart for details).    Family at bedside.  Gust return precautions need for follow-up and they voiced understanding.  Final Clinical Impressions(s) / ED Diagnoses   Final diagnoses:  Alcoholic intoxication without complication ([place])  Fall, initial encounter  Laceration of scalp, initial encounter    ED Discharge Orders    None       Pattricia Boss, MD 07/26/17 2140

## 2017-07-26 NOTE — Discharge Instructions (Addendum)
Return if any increasing confusion, lateralized weakness, or worsening headache Staples out in 5 to 10 days

## 2018-01-27 ENCOUNTER — Other Ambulatory Visit (HOSPITAL_COMMUNITY): Payer: Self-pay | Admitting: Internal Medicine

## 2018-01-27 DIAGNOSIS — R918 Other nonspecific abnormal finding of lung field: Secondary | ICD-10-CM

## 2018-02-13 ENCOUNTER — Ambulatory Visit (HOSPITAL_COMMUNITY)
Admission: RE | Admit: 2018-02-13 | Discharge: 2018-02-13 | Disposition: A | Discharge status: Home / Self Care | Payer: Medicare Other | Source: Ambulatory Visit | Attending: Internal Medicine | Admitting: Internal Medicine

## 2018-02-13 DIAGNOSIS — J432 Centrilobular emphysema: Secondary | ICD-10-CM | POA: Insufficient documentation

## 2018-02-13 DIAGNOSIS — M4854XA Collapsed vertebra, not elsewhere classified, thoracic region, initial encounter for fracture: Secondary | ICD-10-CM | POA: Diagnosis not present

## 2018-02-13 DIAGNOSIS — I700 Atherosclerosis of aorta: Secondary | ICD-10-CM | POA: Diagnosis not present

## 2018-02-13 DIAGNOSIS — R222 Localized swelling, mass and lump, trunk: Secondary | ICD-10-CM | POA: Diagnosis not present

## 2018-02-13 DIAGNOSIS — R918 Other nonspecific abnormal finding of lung field: Secondary | ICD-10-CM

## 2018-02-13 LAB — POCT I-STAT CREATININE: CREATININE: 1 mg/dL (ref 0.44–1.00)

## 2018-02-13 MED ORDER — IOPAMIDOL (ISOVUE-370) INJECTION 76%
100.0000 mL | Freq: Once | INTRAVENOUS | Status: AC | PRN
  Administered 2018-02-13: 75 mL via INTRAVENOUS

## 2018-02-17 ENCOUNTER — Observation Stay (HOSPITAL_COMMUNITY)
Admission: EM | Admit: 2018-02-17 | Discharge: 2018-02-18 | Disposition: A | Discharge status: Home / Self Care | Payer: Medicare Other | Attending: Internal Medicine | Admitting: Internal Medicine

## 2018-02-17 ENCOUNTER — Emergency Department (HOSPITAL_COMMUNITY): Payer: Medicare Other

## 2018-02-17 ENCOUNTER — Other Ambulatory Visit: Payer: Self-pay

## 2018-02-17 ENCOUNTER — Encounter (HOSPITAL_COMMUNITY): Payer: Self-pay | Admitting: Emergency Medicine

## 2018-02-17 DIAGNOSIS — F17210 Nicotine dependence, cigarettes, uncomplicated: Secondary | ICD-10-CM | POA: Insufficient documentation

## 2018-02-17 DIAGNOSIS — Z7984 Long term (current) use of oral hypoglycemic drugs: Secondary | ICD-10-CM | POA: Insufficient documentation

## 2018-02-17 DIAGNOSIS — E119 Type 2 diabetes mellitus without complications: Secondary | ICD-10-CM

## 2018-02-17 DIAGNOSIS — Z79899 Other long term (current) drug therapy: Secondary | ICD-10-CM | POA: Diagnosis not present

## 2018-02-17 DIAGNOSIS — I10 Essential (primary) hypertension: Secondary | ICD-10-CM | POA: Diagnosis present

## 2018-02-17 DIAGNOSIS — J449 Chronic obstructive pulmonary disease, unspecified: Secondary | ICD-10-CM | POA: Diagnosis present

## 2018-02-17 DIAGNOSIS — J441 Chronic obstructive pulmonary disease with (acute) exacerbation: Secondary | ICD-10-CM | POA: Diagnosis not present

## 2018-02-17 DIAGNOSIS — R0602 Shortness of breath: Secondary | ICD-10-CM | POA: Diagnosis present

## 2018-02-17 LAB — COMPREHENSIVE METABOLIC PANEL
ALBUMIN: 4 g/dL (ref 3.5–5.0)
ALK PHOS: 89 U/L (ref 38–126)
ALT: 10 U/L (ref 0–44)
ANION GAP: 13 (ref 5–15)
AST: 19 U/L (ref 15–41)
BILIRUBIN TOTAL: 0.7 mg/dL (ref 0.3–1.2)
BUN: 17 mg/dL (ref 8–23)
CO2: 31 mmol/L (ref 22–32)
Calcium: 9.4 mg/dL (ref 8.9–10.3)
Chloride: 90 mmol/L — ABNORMAL LOW (ref 98–111)
Creatinine, Ser: 0.9 mg/dL (ref 0.44–1.00)
GFR calc Af Amer: >60 mL/min (ref >60)
GFR calc non Af Amer: >60 mL/min (ref >60)
Glucose, Bld: 171 mg/dL — ABNORMAL HIGH (ref 70–99)
POTASSIUM: 2.9 mmol/L — AB (ref 3.5–5.1)
SODIUM: 134 mmol/L — AB (ref 135–145)
Total Protein: 8.1 g/dL (ref 6.5–8.1)

## 2018-02-17 LAB — CBC WITH DIFFERENTIAL/PLATELET
Abs Immature Granulocytes: 0.03 10*3/uL (ref 0.00–0.07)
Basophils Absolute: 0.1 10*3/uL (ref 0.0–0.1)
Basophils Relative: 1 %
EOS PCT: 2 %
Eosinophils Absolute: 0.3 10*3/uL (ref 0.0–0.5)
HEMATOCRIT: 46.9 % — AB (ref 36.0–46.0)
HEMOGLOBIN: 15.5 g/dL — AB (ref 12.0–15.0)
IMMATURE GRANULOCYTES: 0 %
LYMPHS ABS: 3.7 10*3/uL (ref 0.7–4.0)
LYMPHS PCT: 30 %
MCH: 32.2 pg (ref 26.0–34.0)
MCHC: 33 g/dL (ref 30.0–36.0)
MCV: 97.5 fL (ref 80.0–100.0)
Monocytes Absolute: 1.3 10*3/uL — ABNORMAL HIGH (ref 0.1–1.0)
Monocytes Relative: 11 %
NEUTROS PCT: 56 %
Neutro Abs: 7 10*3/uL (ref 1.7–7.7)
Platelets: 273 10*3/uL (ref 150–400)
RBC: 4.81 MIL/uL (ref 3.87–5.11)
RDW: 14.6 % (ref 11.5–15.5)
WBC: 12.3 10*3/uL — AB (ref 4.0–10.5)
nRBC: 0 % (ref 0.0–0.2)

## 2018-02-17 LAB — GLUCOSE, CAPILLARY: Glucose-Capillary: 244 mg/dL — ABNORMAL HIGH (ref 70–99)

## 2018-02-17 LAB — TROPONIN I
TROPONIN I: 0.03 ng/mL — AB (ref <0.03)
Troponin I: 0.03 ng/mL (ref <0.03)

## 2018-02-17 LAB — BRAIN NATRIURETIC PEPTIDE: B Natriuretic Peptide: 45 pg/mL (ref 0.0–100.0)

## 2018-02-17 LAB — MAGNESIUM: Magnesium: 2.6 mg/dL — ABNORMAL HIGH (ref 1.7–2.4)

## 2018-02-17 MED ORDER — CITALOPRAM HYDROBROMIDE 20 MG PO TABS
20.0000 mg | ORAL_TABLET | Freq: Every day | ORAL | Status: DC
  Administered 2018-02-18: 20 mg via ORAL
  Filled 2018-02-17: qty 1

## 2018-02-17 MED ORDER — MAGNESIUM SULFATE 2 GM/50ML IV SOLN
2.0000 g | Freq: Once | INTRAVENOUS | Status: AC
  Administered 2018-02-17: 2 g via INTRAVENOUS
  Filled 2018-02-17: qty 50

## 2018-02-17 MED ORDER — METFORMIN HCL 500 MG PO TABS
500.0000 mg | ORAL_TABLET | Freq: Every day | ORAL | Status: DC
  Administered 2018-02-18: 500 mg via ORAL
  Filled 2018-02-17: qty 1

## 2018-02-17 MED ORDER — OXYBUTYNIN CHLORIDE 5 MG PO TABS
5.0000 mg | ORAL_TABLET | Freq: Three times a day (TID) | ORAL | Status: DC
  Administered 2018-02-17 – 2018-02-18 (×2): 5 mg via ORAL
  Filled 2018-02-17 (×2): qty 1

## 2018-02-17 MED ORDER — SODIUM CHLORIDE 0.9 % IV SOLN
INTRAVENOUS | Status: DC | PRN
  Administered 2018-02-17: 250 mL via INTRAVENOUS

## 2018-02-17 MED ORDER — LEVOFLOXACIN IN D5W 500 MG/100ML IV SOLN
500.0000 mg | Freq: Once | INTRAVENOUS | Status: AC
  Administered 2018-02-17: 500 mg via INTRAVENOUS
  Filled 2018-02-17: qty 100

## 2018-02-17 MED ORDER — ONDANSETRON HCL 4 MG PO TABS: 4.0000 mg | ORAL_TABLET | Freq: Four times a day (QID) | ORAL | Status: DC | PRN

## 2018-02-17 MED ORDER — PRAVASTATIN SODIUM 40 MG PO TABS
40.0000 mg | ORAL_TABLET | Freq: Every day | ORAL | Status: DC
  Administered 2018-02-17: 40 mg via ORAL
  Filled 2018-02-17: qty 1

## 2018-02-17 MED ORDER — ONDANSETRON HCL 4 MG/2ML IJ SOLN: 4.0000 mg | Freq: Four times a day (QID) | INTRAMUSCULAR | Status: DC | PRN

## 2018-02-17 MED ORDER — LISINOPRIL 10 MG PO TABS
40.0000 mg | ORAL_TABLET | Freq: Every day | ORAL | Status: DC
  Administered 2018-02-18: 40 mg via ORAL
  Filled 2018-02-17: qty 4

## 2018-02-17 MED ORDER — GLUCERNA SHAKE PO LIQD
237.0000 mL | Freq: Three times a day (TID) | ORAL | Status: DC
  Administered 2018-02-17: 237 mL via ORAL

## 2018-02-17 MED ORDER — INSULIN ASPART 100 UNIT/ML ~~LOC~~ SOLN
0.0000 [IU] | Freq: Three times a day (TID) | SUBCUTANEOUS | Status: DC
  Administered 2018-02-18 (×2): 2 [IU] via SUBCUTANEOUS

## 2018-02-17 MED ORDER — POTASSIUM CHLORIDE CRYS ER 20 MEQ PO TBCR
40.0000 meq | EXTENDED_RELEASE_TABLET | Freq: Once | ORAL | Status: AC
  Administered 2018-02-17: 40 meq via ORAL
  Filled 2018-02-17: qty 2

## 2018-02-17 MED ORDER — GABAPENTIN 300 MG PO CAPS
600.0000 mg | ORAL_CAPSULE | Freq: Every day | ORAL | Status: DC
  Administered 2018-02-17: 600 mg via ORAL
  Filled 2018-02-17: qty 2

## 2018-02-17 MED ORDER — METHYLPREDNISOLONE SODIUM SUCC 125 MG IJ SOLR
125.0000 mg | Freq: Once | INTRAMUSCULAR | Status: AC
  Administered 2018-02-17: 125 mg via INTRAVENOUS
  Filled 2018-02-17: qty 2

## 2018-02-17 MED ORDER — ACETAMINOPHEN 325 MG PO TABS: 650.0000 mg | ORAL_TABLET | Freq: Four times a day (QID) | ORAL | Status: DC | PRN

## 2018-02-17 MED ORDER — AMLODIPINE BESYLATE 5 MG PO TABS
10.0000 mg | ORAL_TABLET | Freq: Every day | ORAL | Status: DC
  Administered 2018-02-18: 10 mg via ORAL
  Filled 2018-02-17: qty 2

## 2018-02-17 MED ORDER — IPRATROPIUM-ALBUTEROL 0.5-2.5 (3) MG/3ML IN SOLN
3.0000 mL | Freq: Four times a day (QID) | RESPIRATORY_TRACT | Status: DC
  Administered 2018-02-17 – 2018-02-18 (×2): 3 mL via RESPIRATORY_TRACT
  Filled 2018-02-17 (×2): qty 3

## 2018-02-17 MED ORDER — ENOXAPARIN SODIUM 40 MG/0.4ML ~~LOC~~ SOLN
40.0000 mg | SUBCUTANEOUS | Status: DC
  Administered 2018-02-17: 40 mg via SUBCUTANEOUS
  Filled 2018-02-17: qty 0.4

## 2018-02-17 MED ORDER — DM-GUAIFENESIN ER 30-600 MG PO TB12
1.0000 | ORAL_TABLET | Freq: Two times a day (BID) | ORAL | Status: DC
  Administered 2018-02-17 – 2018-02-18 (×2): 1 via ORAL
  Filled 2018-02-17 (×2): qty 1

## 2018-02-17 MED ORDER — POTASSIUM CHLORIDE CRYS ER 20 MEQ PO TBCR
40.0000 meq | EXTENDED_RELEASE_TABLET | ORAL | Status: AC
  Administered 2018-02-17 (×2): 40 meq via ORAL
  Filled 2018-02-17 (×2): qty 2

## 2018-02-17 MED ORDER — METOPROLOL SUCCINATE ER 25 MG PO TB24
25.0000 mg | ORAL_TABLET | Freq: Every day | ORAL | Status: DC
  Administered 2018-02-18: 25 mg via ORAL
  Filled 2018-02-17: qty 1

## 2018-02-17 MED ORDER — IPRATROPIUM-ALBUTEROL 0.5-2.5 (3) MG/3ML IN SOLN
3.0000 mL | Freq: Once | RESPIRATORY_TRACT | Status: AC
  Administered 2018-02-17: 3 mL via RESPIRATORY_TRACT
  Filled 2018-02-17: qty 3

## 2018-02-17 MED ORDER — SODIUM CHLORIDE 0.9 % IV SOLN
100.0000 mg | Freq: Two times a day (BID) | INTRAVENOUS | Status: DC
  Administered 2018-02-17 – 2018-02-18 (×2): 100 mg via INTRAVENOUS
  Filled 2018-02-17 (×5): qty 100

## 2018-02-17 MED ORDER — IPRATROPIUM-ALBUTEROL 0.5-2.5 (3) MG/3ML IN SOLN: 3.0000 mL | RESPIRATORY_TRACT | Status: DC | PRN

## 2018-02-17 MED ORDER — METHYLPREDNISOLONE SODIUM SUCC 40 MG IJ SOLR
40.0000 mg | Freq: Two times a day (BID) | INTRAMUSCULAR | Status: DC
  Administered 2018-02-18: 40 mg via INTRAVENOUS
  Filled 2018-02-17: qty 1

## 2018-02-17 MED ORDER — ACETAMINOPHEN 650 MG RE SUPP: 650.0000 mg | Freq: Four times a day (QID) | RECTAL | Status: DC | PRN

## 2018-02-17 MED ORDER — TRAZODONE HCL 50 MG PO TABS
150.0000 mg | ORAL_TABLET | Freq: Every day | ORAL | Status: DC
  Administered 2018-02-17: 150 mg via ORAL
  Filled 2018-02-17: qty 3

## 2018-02-17 MED ORDER — HYDROCHLOROTHIAZIDE 25 MG PO TABS
25.0000 mg | ORAL_TABLET | Freq: Every day | ORAL | Status: DC
  Administered 2018-02-18: 25 mg via ORAL
  Filled 2018-02-17: qty 1

## 2018-02-17 MED ORDER — ALBUTEROL SULFATE (2.5 MG/3ML) 0.083% IN NEBU
2.5000 mg | INHALATION_SOLUTION | Freq: Once | RESPIRATORY_TRACT | Status: AC
  Administered 2018-02-17: 2.5 mg via RESPIRATORY_TRACT
  Filled 2018-02-17: qty 3

## 2018-02-17 NOTE — ED Triage Notes (Signed)
Pt states having a wood stove in her home and has been having difficulty breathing x 3 days.  88% room air.  Productive cough

## 2018-02-17 NOTE — ED Notes (Signed)
CRITICAL VALUE ALERT  Critical Value:  Troponin 0.03  Date & Time Notied:  02/17/2018, 1038 Provider Notified: Dr. Roderic Palau  Orders Received/Actions taken: see chart

## 2018-02-17 NOTE — ED Notes (Signed)
Date and time results received: 02/17/18 1714  Test: Troponin Critical Value: 0.03  Name of Provider Notified: [facility]  Orders Received? Or Actions Taken?: See chart

## 2018-02-17 NOTE — ED Provider Notes (Signed)
[facility] EMERGENCY DEPARTMENT Provider Note   CSN: [id number] Arrival date & time: 02/17/18  0935     History   Chief Complaint Chief Complaint  Patient presents with  . Shortness of Breath    HPI Holly May is a 67 y.o. female.  Patient complains of shortness of breath.  She is also had a cough with yellow sputum production.  Patient has a history of COPD  The history is provided by the patient. No language interpreter was used.  Shortness of Breath  This is a new problem. The problem occurs continuously.The current episode started less than 1 hour ago. The problem has not changed since onset.Pertinent negatives include no fever, no headaches, no cough, no chest pain, no abdominal pain and no rash. She has tried nothing for the symptoms. The treatment provided no relief. She has had prior hospitalizations. She has had prior ED visits. She has had prior ICU admissions. Associated medical issues include COPD.    Past Medical History:  Diagnosis Date  . Arthritis   . Depression   . Essential hypertension, benign   . Type 2 diabetes mellitus ([place])   . Urinary incontinence     Patient Active Problem List   Diagnosis Date Noted  . Acute on chronic respiratory failure ([place]) 06/12/2016  . Acute exacerbation of chronic obstructive pulmonary disease (COPD) ([place]) 06/12/2016  . Elevated troponin 06/12/2016  . Hypokalemia 06/12/2016  . Acute renal failure (ARF) ([place]) 06/12/2016  . Preoperative cardiovascular examination 05/18/2014  . Abnormal ECG 05/18/2014  . Humerus shaft fracture 04/06/2013  . Closed fracture of unspecified part of upper end of humerus 04/06/2013  . Syncope 03/31/2013  . Fracture of left humerus 03/31/2013  . DM type 2 (diabetes mellitus, type 2) ([place]) 03/31/2013  . HTN (hypertension) 03/31/2013  . Heme positive stool 03/31/2013  . Normocytic anemia 03/31/2013  . Tobacco abuse 03/31/2013    Past Surgical History:  Procedure Laterality Date  .  CHOLECYSTECTOMY    . OPEN REDUCTION INTERNAL FIXATION (ORIF) PROXIMAL PHALANX Left 04/11/2017   Procedure: OPEN REDUCTION INTERNAL FIXATION (ORIF) PROXIMAL PHALANX;  Surgeon: Charlotte Crumb, MD;  Location: [facility];  Service: Orthopedics;  Laterality: Left;     OB History   None      Home Medications    Prior to Admission medications   Medication Sig Start Date End Date Taking? Authorizing Provider  albuterol (PROVENTIL HFA;VENTOLIN HFA) 108 (90 Base) MCG/ACT inhaler Inhale 2 puffs into the lungs every 4 (four) hours as needed for wheezing or shortness of breath (cough, shortness of breath or wheezing.). 06/14/16  Yes Johnson, Clanford L, MD  amLODipine (NORVASC) 10 MG tablet Take 10 mg by mouth daily.   Yes [provider]  citalopram (CELEXA) 20 MG tablet Take 1 tablet (20 mg total) by mouth daily. 06/14/16  Yes Johnson, Clanford L, MD  gabapentin (NEURONTIN) 300 MG capsule Take 600 mg by mouth at bedtime.    Yes [provider]  hydrochlorothiazide (HYDRODIURIL) 25 MG tablet Take 25 mg by mouth daily.   Yes [provider]  lisinopril (PRINIVIL,ZESTRIL) 40 MG tablet Take 40 mg by mouth daily.   Yes [provider]  lovastatin (MEVACOR) 40 MG tablet Take 40 mg by mouth at bedtime.   Yes [provider]  metFORMIN (GLUCOPHAGE) 1000 MG tablet Take 500 mg by mouth daily with breakfast.    Yes [provider]  metoprolol succinate (TOPROL-XL) 25 MG 24 hr tablet  Take 25 mg by mouth daily.   Yes [provider]  oxybutynin (DITROPAN) 5 MG tablet Take 5 mg by mouth 3 (three) times daily.    Yes [provider]  SPIRIVA HANDIHALER 18 MCG inhalation capsule Place 1 puff into inhaler and inhale daily. 01/24/18  Yes [provider]  traZODone (DESYREL) 150 MG tablet Take 1 tablet by mouth at bedtime. 01/27/18  Yes [provider]  Vitamin D, Ergocalciferol, (DRISDOL) 50000 units CAPS capsule  Take 50,000 Units by mouth every 7 (seven) days.   Yes [provider]    Family History Family History  Problem Relation Age of Onset  . Diabetes Mellitus II Sister   . Diabetes Mellitus II Mother   . Hypertension Brother   . Hypertension Sister     Social History Social History   Tobacco Use  . Smoking status: Current Every Day Smoker    Packs/day: 1.00    Years: 43.00    Pack years: 43.00    Types: Cigarettes    Start date: 01/09/1971  . Smokeless tobacco: Never Used  Substance Use Topics  . Alcohol use: Yes    Alcohol/week: 0.0 standard drinks    Comment: sometimes  . Drug use: No     Allergies   Patient has no known allergies.   Review of Systems Review of Systems  Constitutional: Negative for appetite change, fatigue and fever.  HENT: Negative for congestion, ear discharge and sinus pressure.   Eyes: Negative for discharge.  Respiratory: Positive for shortness of breath. Negative for cough.   Cardiovascular: Negative for chest pain.  Gastrointestinal: Negative for abdominal pain and diarrhea.  Genitourinary: Negative for frequency and hematuria.  Musculoskeletal: Negative for back pain.  Skin: Negative for rash.  Neurological: Negative for seizures and headaches.  Psychiatric/Behavioral: Negative for hallucinations.     Physical Exam Updated Vital Signs BP 106/73   Pulse 78   Temp 97.6 F (36.4 C) (Oral)   Resp 17   Ht 5\' 7"  (1.702 m)   Wt 64 kg   SpO2 97%   BMI 22.08 kg/m   Physical Exam  Constitutional: She is oriented to person, place, and time. She appears well-developed.  HENT:  Head: Normocephalic.  Eyes: Conjunctivae and EOM are normal. No scleral icterus.  Neck: Neck supple. No thyromegaly present.  Cardiovascular: Normal rate and regular rhythm. Exam reveals no gallop and no friction rub.  No murmur heard. Pulmonary/Chest: No stridor. She has wheezes. She has no rales. She exhibits no tenderness.  Abdominal: She exhibits  no distension. There is no tenderness. There is no rebound.  Musculoskeletal: Normal range of motion. She exhibits no edema.  Lymphadenopathy:    She has no cervical adenopathy.  Neurological: She is oriented to person, place, and time. She exhibits normal muscle tone. Coordination normal.  Skin: No rash noted. No erythema.  Psychiatric: She has a normal mood and affect. Her behavior is normal.     ED Treatments / Results  Labs (all labs ordered are listed, but only abnormal results are displayed) Labs Reviewed  CBC WITH DIFFERENTIAL/PLATELET - Abnormal; Notable for the following components:      Result Value   WBC 12.3 (*)    Hemoglobin 15.5 (*)    HCT 46.9 (*)    Monocytes Absolute 1.3 (*)    All other components within normal limits  COMPREHENSIVE METABOLIC PANEL - Abnormal; Notable for the following components:   Sodium 134 (*)    Potassium  2.9 (*)    Chloride 90 (*)    Glucose, Bld 171 (*)    All other components within normal limits  TROPONIN I - Abnormal; Notable for the following components:   Troponin I 0.03 (*)    All other components within normal limits  BRAIN NATRIURETIC PEPTIDE    EKG EKG Interpretation  Date/Time:  Tuesday February 17 2018 09:45:08 EST Ventricular Rate:  93 PR Interval:    QRS Duration: 91 QT Interval:  383 QTC Calculation: 477 R Axis:   25 Text Interpretation:  Sinus rhythm Abnormal R-wave progression, early transition Probable LVH with secondary repol abnrm Confirmed by Milton Ferguson [phone]) on 02/17/2018 12:51:41 PM   Radiology Dg Chest 2 View  Result Date: 02/17/2018 CLINICAL DATA:  Shortness of breath. History of hypertension, diabetes, emphysema, current smoker. EXAM: CHEST - 2 VIEW COMPARISON:  CT scan of the chest of February 03, 2018 and PA and lateral chest x-ray of March 29, 2017. FINDINGS: The lungs are hyperinflated with mild hemidiaphragm flattening. There is no focal infiltrate. There is no pleural effusion. The heart  and pulmonary vascularity are normal. 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 There is calcification in the wall of the aortic arch. The thoracic vertebral bodies are preserved in height. There is mild wedge compression of the body of L1 which is stable. There is deformity of the posterior aspect of the right 6 rib which is been previously demonstrated. IMPRESSION: COPD. No acute cardiopulmonary abnormality. The known mediastinal mass is not demonstrated on today's study. Thoracic aortic atherosclerosis. Electronically Signed   By: David  Martinique M.D.   On: 02/17/2018 10:51    Procedures Procedures (including critical care time)  Medications Ordered in ED Medications  levofloxacin (LEVAQUIN) IVPB 500 mg (has no administration in time range)  potassium chloride SA (K-DUR,KLOR-CON) CR tablet 40 mEq (40 mEq Oral Given 02/17/18 1211)  methylPREDNISolone sodium succinate (SOLU-MEDROL) 125 mg/2 mL injection 125 mg (125 mg Intravenous Given 02/17/18 1329)  ipratropium-albuterol (DUONEB) 0.5-2.5 (3) MG/3ML nebulizer solution 3 mL (3 mLs Nebulization Given 02/17/18 1409)  albuterol (PROVENTIL) (2.5 MG/3ML) 0.083% nebulizer solution 2.5 mg (2.5 mg Nebulization Given 02/17/18 1409)  magnesium sulfate IVPB 2 g 50 mL (0 g Intravenous Stopped 02/17/18 1433)     Initial Impression / Assessment and Plan / ED Course  I have reviewed the triage vital signs and the nursing notes.  Pertinent labs & imaging results that were available during my care of the patient were reviewed by me and considered in my medical decision making (see chart for details).     Patient with exacerbation of COPD.  She will be admitted to medicine  Final Clinical Impressions(s) / ED Diagnoses   Final diagnoses:  None    ED Discharge Orders    None       Milton Ferguson, MD 02/17/18 1503

## 2018-02-17 NOTE — H&P (Signed)
History and Physical    Holly May SWN:[id number] DOB: 1950/12/11 DOA: 02/17/2018  PCP: Abran Richard, MD   Patient coming from: Home  Chief Complaint: SOB, Cough  HPI: Holly May is a 67 y.o. female with medical history significant COPD-not on home O2, HTN, DM, ongoing tobacco abuse presented to the ED with complaints of shortness of breath and increasing productive cough over the past 3 days.  Cough is productive of yellowish sputum.  She endorses subjective fevers and chills.  She reports tightness over her left chest, nonradiating, that lasted 2 days and has resolved, related to her COPD.  No prior CAD history, son has had open heart surgery- 2ce, ?indication.  No recent travel, no personal history of blood clots in lungs or heart, no lower extremity swelling redness or pain.  No diarrhea no vomiting. (Despite chest xray findings patient denies any surgeries.)  ED Course: Hypoxia 88% on RA per Triage, WBC- 12.3, Low K- 2.9, Mild elevated trop- 0.03, EKG- mild ST depressions in lateral leads- old, and unchanged EKG, no acute cardiopulmonary abnormality, known mediastinal mass not demonstrated. Patient given Duonebs, levaquin, Solumedrol, KCL and magnesium supplimentation. Hospitalist to admit for COPD exacerbation.  Review of Systems: As per HPI all other systems reviewed and negative.   Past Medical History:  Diagnosis Date  . Arthritis   . Depression   . Essential hypertension, benign   . Type 2 diabetes mellitus ([place])   . Urinary incontinence     Past Surgical History:  Procedure Laterality Date  . CHOLECYSTECTOMY    . OPEN REDUCTION INTERNAL FIXATION (ORIF) PROXIMAL PHALANX Left 04/11/2017   Procedure: OPEN REDUCTION INTERNAL FIXATION (ORIF) PROXIMAL PHALANX;  Surgeon: Charlotte Crumb, MD;  Location: [place];  Service: Orthopedics;  Laterality: Left;     reports that she has been smoking cigarettes. She started smoking about 47 years ago. She has a  43.00 pack-year smoking history. She has never used smokeless tobacco. She reports that she drinks alcohol. She reports that she does not use drugs.  No Known Allergies  Family History  Problem Relation Age of Onset  . Diabetes Mellitus II Sister   . Diabetes Mellitus II Mother   . Hypertension Brother   . Hypertension Sister     Prior to Admission medications   Medication Sig Start Date End Date Taking? Authorizing Provider  albuterol (PROVENTIL HFA;VENTOLIN HFA) 108 (90 Base) MCG/ACT inhaler Inhale 2 puffs into the lungs every 4 (four) hours as needed for wheezing or shortness of breath (cough, shortness of breath or wheezing.). 06/14/16  Yes Johnson, Clanford L, MD  amLODipine (NORVASC) 10 MG tablet Take 10 mg by mouth daily.   Yes [provider]  citalopram (CELEXA) 20 MG tablet Take 1 tablet (20 mg total) by mouth daily. 06/14/16  Yes Johnson, Clanford L, MD  gabapentin (NEURONTIN) 300 MG capsule Take 600 mg by mouth at bedtime.    Yes [provider]  hydrochlorothiazide (HYDRODIURIL) 25 MG tablet Take 25 mg by mouth daily.   Yes [provider]  lisinopril (PRINIVIL,ZESTRIL) 40 MG tablet Take 40 mg by mouth daily.   Yes [provider]  lovastatin (MEVACOR) 40 MG tablet Take 40 mg by mouth at bedtime.   Yes [provider]  metFORMIN (GLUCOPHAGE) 1000 MG tablet Take 500 mg by mouth daily with breakfast.    Yes [provider]  metoprolol succinate (TOPROL-XL) 25 MG 24 hr tablet Take 25 mg by mouth daily.  Yes [provider]  oxybutynin (DITROPAN) 5 MG tablet Take 5 mg by mouth 3 (three) times daily.    Yes [provider]  SPIRIVA HANDIHALER 18 MCG inhalation capsule Place 1 puff into inhaler and inhale daily. 01/24/18  Yes [provider]  traZODone (DESYREL) 150 MG tablet Take 1 tablet by mouth at bedtime. 01/27/18  Yes [provider]  Vitamin D, Ergocalciferol, (DRISDOL) 50000 units CAPS  capsule Take 50,000 Units by mouth every 7 (seven) days.   Yes [provider]    Physical Exam: Vitals:   02/17/18 1330 02/17/18 1400 02/17/18 1410 02/17/18 1522  BP: 106/68 106/73  136/77  Pulse: 84 78  89  Resp: 18 17  17   Temp:      TempSrc:      SpO2: 92% 95% 97% 99%  Weight:      Height:        Constitutional:  calm, comfortable, 2L [place] Vitals:   02/17/18 1330 02/17/18 1400 02/17/18 1410 02/17/18 1522  BP: 106/68 106/73  136/77  Pulse: 84 78  89  Resp: 18 17  17   Temp:      TempSrc:      SpO2: 92% 95% 97% 99%  Weight:      Height:       Eyes: PERRL, lids and conjunctivae normal ENMT: Mucous membranes are moist. Posterior pharynx clear of any exudate or lesions. poor dentition.  Neck: normal, supple, no masses, no thyromegaly Respiratory: Diffuse rhonchi, no crackles. Normal respiratory effort. No accessory muscle use.  Cardiovascular: Regular rate and rhythm, no murmurs / rubs / gallops. No extremity edema. 2+ pedal pulses.  Abdomen: no tenderness, no masses palpated. No hepatosplenomegaly. Bowel sounds positive.  Musculoskeletal: no clubbing / cyanosis. No joint deformity upper and lower extremities. Good ROM, no contractures. Normal muscle tone.  Skin: no rashes, lesions, ulcers. No induration Neurologic: CN 2-12 grossly intact. Sensation intact, DTR normal. Strength 5/5 in all 4.  Psychiatric: Normal judgment and insight. Alert and oriented x 3. Normal mood.   Labs on Admission: I have personally reviewed following labs and imaging studies  CBC: Recent Labs  Lab 02/17/18 0941  WBC 12.3*  NEUTROABS 7.0  HGB 15.5*  HCT 46.9*  MCV 97.5  PLT 350   Basic Metabolic Panel: Recent Labs  Lab 02/13/18 1640 02/17/18 0941  NA  --  134*  K  --  2.9*  CL  --  90*  CO2  --  31  GLUCOSE  --  171*  BUN  --  17  CREATININE 1.00 0.90  CALCIUM  --  9.4   Liver Function Tests: Recent Labs  Lab 02/17/18 0941  AST 19  ALT 10  ALKPHOS 89  BILITOT 0.7    PROT 8.1  ALBUMIN 4.0   Cardiac Enzymes: Recent Labs  Lab 02/17/18 0941  TROPONINI 0.03*   Radiological Exams on Admission: Dg Chest 2 View  Result Date: 02/17/2018 CLINICAL DATA:  Shortness of breath. History of hypertension, diabetes, emphysema, current smoker. EXAM: CHEST - 2 VIEW COMPARISON:  CT scan of the chest of February 03, 2018 and PA and lateral chest x-ray of March 29, 2017. FINDINGS: The lungs are hyperinflated with mild hemidiaphragm flattening. There is no focal infiltrate. There is no pleural effusion. The heart and pulmonary vascularity are normal. 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 There is calcification in the wall of the aortic arch. The thoracic  vertebral bodies are preserved in height. There is mild wedge compression of the body of L1 which is stable. There is deformity of the posterior aspect of the right 6 rib which is been previously demonstrated. IMPRESSION: COPD. No acute cardiopulmonary abnormality. The known mediastinal mass is not demonstrated on today's study. Thoracic aortic atherosclerosis. Electronically Signed   By: David  Martinique M.D.   On: 02/17/2018 10:51    EKG: Independently reviewed. Sinus, LVH, Unchanged EKG- with mild ST depression lateral leads- V5, V6- Old. Mild prolonged QTC- 477.  Assessment/Plan Principal Problem:   Acute exacerbation of chronic obstructive pulmonary disease (COPD) (HCC) Active Problems:   DM type 2 (diabetes mellitus, type 2) (HCC)   HTN (hypertension)  COPD exacerbation- Productive cough, SOB, hypoxia- 88%. WBC- 12.3, Chest xray- no acute abnormality. Started on levaquin, solumedrol in ED - Cont Steroids 40mg  BID - IV Doxycycline for (mild prolonged QTC) - Mucolytics, Supp O2 - Duonebs, Sch and PRN - CBC a.m  Hypokalemia- K- 2.9. Likely from HCTZ use. MAg supplimentation already given in ED - Replete K - Check Mag - BMP a.m  Chest tightness- elevated trop-  0.03. Likely related to COPD exacerbation. Prior mild Troponin elevations with COPD exacerbation. Unchanged EKG.  - Trend troponin  HTN- Stable - Resume home meds- Metop, HCTZ, Lisinopril and Norvasc  DM- Glucose 171. No recent hgBa1c - SSI - Cont home metformin  Tobacco Abuse- 1PPD. - Counselled to quit smoking.    DVT prophylaxis: Lovenox Code Status: Full Family Communication: None at bedside Disposition Plan: 1-2 days Consults called: None Admission status: Obs, tele   Bethena Roys MD [facility] Pager [phone] From 3PM-11PM.  Otherwise please contact night-coverage [email or web address] Password [email or web address]  02/17/2018, 3:26 PM

## 2018-02-18 DIAGNOSIS — J441 Chronic obstructive pulmonary disease with (acute) exacerbation: Secondary | ICD-10-CM

## 2018-02-18 LAB — BASIC METABOLIC PANEL
ANION GAP: 9 (ref 5–15)
BUN: 29 mg/dL — AB (ref 8–23)
CO2: 28 mmol/L (ref 22–32)
Calcium: 9.3 mg/dL (ref 8.9–10.3)
Chloride: 95 mmol/L — ABNORMAL LOW (ref 98–111)
Creatinine, Ser: 1.14 mg/dL — ABNORMAL HIGH (ref 0.44–1.00)
GFR calc Af Amer: 57 mL/min — ABNORMAL LOW (ref >60)
GFR, EST NON AFRICAN AMERICAN: 49 mL/min — AB (ref >60)
Glucose, Bld: 178 mg/dL — ABNORMAL HIGH (ref 70–99)
POTASSIUM: 4.9 mmol/L (ref 3.5–5.1)
SODIUM: 132 mmol/L — AB (ref 135–145)

## 2018-02-18 LAB — CBC
HCT: 41.1 % (ref 36.0–46.0)
HEMOGLOBIN: 13.5 g/dL (ref 12.0–15.0)
MCH: 33.1 pg (ref 26.0–34.0)
MCHC: 32.8 g/dL (ref 30.0–36.0)
MCV: 100.7 fL — ABNORMAL HIGH (ref 80.0–100.0)
PLATELETS: 273 10*3/uL (ref 150–400)
RBC: 4.08 MIL/uL (ref 3.87–5.11)
RDW: 14.5 % (ref 11.5–15.5)
WBC: 10.7 10*3/uL — AB (ref 4.0–10.5)
nRBC: 0 % (ref 0.0–0.2)

## 2018-02-18 LAB — GLUCOSE, CAPILLARY
GLUCOSE-CAPILLARY: 200 mg/dL — AB (ref 70–99)
GLUCOSE-CAPILLARY: 180 mg/dL — AB (ref 70–99)

## 2018-02-18 MED ORDER — IPRATROPIUM-ALBUTEROL 0.5-2.5 (3) MG/3ML IN SOLN: 3.0000 mL | RESPIRATORY_TRACT | 3 refills | Status: DC | PRN

## 2018-02-18 MED ORDER — PREDNISONE 20 MG PO TABS: 40.0000 mg | ORAL_TABLET | Freq: Every day | ORAL | 0 refills | Status: AC

## 2018-02-18 MED ORDER — ALBUTEROL SULFATE HFA 108 (90 BASE) MCG/ACT IN AERS: 2.0000 | INHALATION_SPRAY | RESPIRATORY_TRACT | 3 refills | Status: AC | PRN
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
## 2018-02-18 NOTE — Progress Notes (Signed)
Nutrition Brief Note  Patient identified on the Malnutrition Screening Tool (MST) Report. Pt had noted poor appetite and unintentional wt loss.   Wt Readings from Last 15 Encounters:  02/17/18 63.1 kg  04/11/17 67.9 kg  04/08/17 61.2 kg  03/29/17 67.6 kg  06/12/16 67.9 kg  05/19/15 67.6 kg  04/24/15 71.2 kg  05/18/14 71.2 kg  06/15/13 67.6 kg  05/07/13 64.4 kg  04/06/13 62.1 kg  03/31/13 64.9 kg   Body mass index is 21.8 kg/m. Patient meets criteria for healthy wt for ht based on current BMI.   Pt w/ poor health literacy. 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 This has been going on for many years.   Similarly, she says she has lost a sizeable amount of weight in the distant past . She says she was 212 lbs in 2013. From review of objective history, pt has fluctuated between 135-150 lbs for the past 5 years. There is no available evidence to support pts purported wt loss.   She asks for steroids for weight gain.   RD reviewed how her poor appetite is likely multifactorial, stemming from her COPD, continued tobacco abuse, and long term adaption of her body to 1 meal/day.   RD noted COPD causes decreased appetite and increased energy expenditure. To address this, she should stop smoking. That would be more appropriate than providing pills for wt gain. Noted steroids are not indicated as wt gain supplement.   RD spent some time reviewing high kcal/protein foods. Also advised that she needs to break her habit of 1 meal/d. To do this, she may need to force herself to eat until her body gets used to more consistent intake.   As RD finishing, RN enters to D/C patient. Of note, pt ate 100% of her meal and also consumed a couple side dishes.   Burtis Junes RD, LDN, CNSC Clinical Nutrition Available Tues-Sat via Pager: [phone] 02/18/2018 1:20 PM

## 2018-02-18 NOTE — Progress Notes (Signed)
Patient Saturations on Room Air at Rest = 97%  Patient Saturations on Room Air while Ambulating = 92%  

## 2018-02-18 NOTE — Care Management Note (Signed)
Case Management Note  Patient Details  Name: Holly May MRN: [id number] Date of Birth: 07/16/1950  Subjective/Objective:  Admitted with COPD exacerbation. Pt from home, lives with son. Pt ind with ADL's at baseline. Is not on oxygen pta, has a neb machine but needs a Rx for treatments. Pt says she is on a fixed budget, does not meet medicaid requirements. Has to purchase depends which are costly. Pt drives herself to appointments and to do shopping. Pt heats her home with wood.                   Action/Plan: Anticipate DC home with self care. No CM needs identified or communicated at this time. CM may be consulted if needs arise.   Expected Discharge Date:       02/19/18           Expected Discharge Plan:  Home/Self Care  In-House Referral:  NA  Discharge planning Services  CM Consult  Post Acute Care Choice:  NA Choice offered to:  NA  Status of Service:  Completed, signed off  Sherald Barge, RN 02/18/2018, 11:18 AM

## 2018-02-19 LAB — HIV ANTIBODY (ROUTINE TESTING W REFLEX): HIV SCREEN 4TH GENERATION: NONREACTIVE

## 2018-03-17 ENCOUNTER — Institutional Professional Consult (permissible substitution): Payer: Medicare Other | Admitting: Cardiothoracic Surgery

## 2018-03-17 ENCOUNTER — Encounter: Payer: Self-pay | Admitting: Cardiothoracic Surgery

## 2018-03-17 VITALS — BP 130/72 | HR 82 | Resp 20 | Ht 67.0 in | Wt 139.0 lb

## 2018-03-17 DIAGNOSIS — E320 Persistent hyperplasia of thymus: Secondary | ICD-10-CM | POA: Diagnosis not present

## 2018-03-17 NOTE — Progress Notes (Signed)
PCP is Abran Richard, MD Referring Provider is Abran Richard, MD  Chief Complaint  Patient presents with  . Consult    Surgical eval for enlarged Thymus gland, Chest CTA 02/13/18     HPI: Patient examined, serial CT scan images from 2017, 2018, and November 2019 all personally reviewed and discussed with patient.  The patient is a 67 year old female in poor general health from heavy smoking and alcohol intake.  She was recently hospitalized for COPD flareup.  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  This is smoothly configured and now measures 3.5 cm in diameter.  In 2017 it measured approximately 2.8 cm.  She has evidence of emphysema but no discrete pulmonary nodules.  No abnormal mediastinal adenopathy.  Patient smokes 1 pack/day.  She has very poor bedside mechanics and abnormal chest exam on auscultation.  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  I have recommended to the patient that she should stop smoking and allow pulmonary recovery before undergoing thoracic surgery to reduce the risks of postoperative pneumonia, prolonged intubation, and other potential complications from prolonged ventilation.  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   Past Medical History:  Diagnosis Date  . Arthritis   . Depression   . Essential hypertension, benign   . Type 2 diabetes mellitus ([place])   . Urinary incontinence     Past Surgical History:  Procedure Laterality Date  . CHOLECYSTECTOMY    . OPEN REDUCTION INTERNAL FIXATION (ORIF) PROXIMAL PHALANX Left 04/11/2017   Procedure: OPEN REDUCTION INTERNAL FIXATION (ORIF) PROXIMAL PHALANX;  Surgeon: Charlotte Crumb, MD;  Location: [place];  Service: Orthopedics;  Laterality: Left;    Family History   Problem Relation Age of Onset  . Diabetes Mellitus II Sister   . Diabetes Mellitus II Mother   . Hypertension Brother   . Hypertension Sister     Social History Social History   Tobacco Use  . Smoking status: Current Every Day Smoker    Packs/day: 1.00    Years: 43.00    Pack years: 43.00    Types: Cigarettes    Start date: 01/09/1971  . Smokeless tobacco: Never Used  Substance Use Topics  . Alcohol use: Yes    Alcohol/week: 0.0 standard drinks    Comment: sometimes  . Drug use: No    Current Outpatient Medications  Medication Sig Dispense Refill  . albuterol (PROVENTIL HFA;VENTOLIN HFA) 108 (90 Base) MCG/ACT inhaler Inhale 2 puffs into the lungs every 4 (four) hours as needed for wheezing or shortness of breath (cough, shortness of breath or wheezing.). 1 Inhaler 3  . amLODipine (NORVASC) 10 MG tablet Take 10 mg by mouth daily.    . citalopram (CELEXA) 20 MG tablet Take 1 tablet (20 mg total) by mouth daily. 30 tablet 0  . gabapentin (NEURONTIN) 300 MG capsule Take 600 mg by mouth at bedtime.     . hydrochlorothiazide (HYDRODIURIL) 25 MG tablet Take 25 mg by mouth daily.    Marland Kitchen ipratropium-albuterol (DUONEB) 0.5-2.5 (3) MG/3ML SOLN Take 3 mLs by nebulization every 4 (four) hours as needed (shortness of breath or wheezing). 360 mL 3  . lisinopril (PRINIVIL,ZESTRIL) 40 MG tablet Take 40 mg by mouth daily.    Marland Kitchen lovastatin (MEVACOR) 40 MG tablet Take 40 mg by  mouth at bedtime.    . metFORMIN (GLUCOPHAGE) 1000 MG tablet Take 500 mg by mouth daily with breakfast.     . metoprolol succinate (TOPROL-XL) 25 MG 24 hr tablet Take 25 mg by mouth daily.    Marland Kitchen oxybutynin (DITROPAN) 5 MG tablet Take 5 mg by mouth 3 (three) times daily.     Marland Kitchen SPIRIVA HANDIHALER 18 MCG inhalation capsule Place 1 puff into inhaler and inhale daily.  4  . traZODone (DESYREL) 150 MG tablet Take 1 tablet by mouth at bedtime.  5  . Vitamin D, Ergocalciferol, (DRISDOL) 50000 units CAPS capsule Take 50,000 Units by  mouth every 7 (seven) days.     No current facility-administered medications for this visit.     No Known Allergies  Review of Systems                    Review of Systems :  [ y ] = yes, [  ] = no        General :  Weight gain [   ]    Weight loss  [  y ]  Fatigue [ y ]  Fever [  ]  Chills  [  ]                                          HEENT    Headache [  ]  Dizziness [  ]  Blurred vision [  ] Glaucoma  [  ]                          Nosebleeds [  ] Painful or loose teeth [  ]        Cardiac :  Chest pain/ pressure [  ]  Resting SOB [  ] exertional SOB [ y ]                        Orthopnea [  ]  Pedal edema  [  ]  Palpitations [  ] Syncope/presyncope [yes 2017 with subsequent left numerous fracture]                        Paroxysmal nocturnal dyspnea [  ]         Pulmonary : cough Blue.Reese  ]  wheezing Blue.Reese  ]  Hemoptysis [  ] Sputum [  ] Snoring [  ]                              Pneumothorax [  ]  Sleep apnea [  ]        GI : Vomiting [  ]  Dysphagia [  ]  Melena  [  ]  Abdominal pain [  ] BRBPR [  ]              Heart burn [  ]  Constipation [  ] Diarrhea  [  ] Colonoscopy [   ]        GU : Hematuria [  ]  Dysuria [  ]  Nocturia [  ] UTI's [  ]        Vascular : Claudication [  ]  Rest pain [  ]  DVT [  ] Vein stripping [  ] leg ulcers [  ]                          TIA [  ] Stroke [  ]  Varicose veins [  ]        NEURO :  Headaches  [  ] Seizures [  ] Vision changes [  ] Paresthesias [  ]                                               Musculoskeletal :  Arthritis [ y ] Gout  [  ]  Back pain [  ]  Joint pain [  ]        Skin :  Rash [  ]  Melanoma [  ] Sores [  ]        Heme : Bleeding problems [  ]Clotting Disorders [  ] Anemia [  ]Blood Transfusion [ ]         Endocrine : Diabetes [ y ] Heat or Cold intolerance [  ] Polyuria [  ]excessive thirst [ ]         Psych : Depression [  ]  Anxiety [  ]  Psych hospitalizations [  ] Memory change [  ]                                                                             BP 130/72   Pulse 82   Resp 20   Ht 5\' 7"  (1.702 m)   Wt 139 lb (63 kg)   SpO2 94% Comment: RA  BMI 21.77 kg/m  Physical Exam     Physical Exam  General: Thin middle-aged AA female who appears older than her stated age and chronically ill HEENT: Normocephalic pupils equal , dentition poor with only 4 teeth and mandible Neck: Supple without JVD, adenopathy, or bruit Chest: Coarse breath sounds bilaterally with loud rhonchi at right lung base.  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  No evidence of lung cancer.  Impression: Patient's anterior mediastinal mass/thymoma should be resected.  However the patient's pulmonary status now would preclude elective surgery because of very high risk of pneumonia, ventilator dependence, pulmonary complications.  Her bedside mechanics and air movement are  so poor that I would not send her for PFTs at this time.  I recommended to the patient that she slowly wean off  the tobacco, continue her inhalers and I will see her back in 8 weeks to see if her pulmonary status can improve.  Since this tumor is slow-growing there is no reason to push forward with surgery and expose the patient to very high risk of surgical complications and poor outcome.  Plan: Return in 8 weeks to assess pulmonary status.  She would need to be able to realistically tolerate partial sternotomy and  resection of his anterior mediastinal mass.  At this point she would not be considered an operative candidate.  The importance of her stopping smoking was emphasized for several reasons.   Len Childs, MD [facility] [phone]

## 2018-04-16 ENCOUNTER — Emergency Department (HOSPITAL_COMMUNITY)
Admission: EM | Admit: 2018-04-16 | Discharge: 2018-04-17 | Disposition: A | Discharge status: Home / Self Care | Payer: Medicare Other | Attending: Emergency Medicine | Admitting: Emergency Medicine

## 2018-04-16 ENCOUNTER — Encounter (HOSPITAL_COMMUNITY): Payer: Self-pay

## 2018-04-16 DIAGNOSIS — Y998 Other external cause status: Secondary | ICD-10-CM | POA: Insufficient documentation

## 2018-04-16 DIAGNOSIS — Y9389 Activity, other specified: Secondary | ICD-10-CM | POA: Insufficient documentation

## 2018-04-16 DIAGNOSIS — W19XXXA Unspecified fall, initial encounter: Secondary | ICD-10-CM

## 2018-04-16 DIAGNOSIS — E119 Type 2 diabetes mellitus without complications: Secondary | ICD-10-CM | POA: Diagnosis not present

## 2018-04-16 DIAGNOSIS — F329 Major depressive disorder, single episode, unspecified: Secondary | ICD-10-CM | POA: Diagnosis not present

## 2018-04-16 DIAGNOSIS — J449 Chronic obstructive pulmonary disease, unspecified: Secondary | ICD-10-CM | POA: Diagnosis not present

## 2018-04-16 DIAGNOSIS — Z79899 Other long term (current) drug therapy: Secondary | ICD-10-CM | POA: Insufficient documentation

## 2018-04-16 DIAGNOSIS — Y92000 Kitchen of unspecified non-institutional (private) residence as  the place of occurrence of the external cause: Secondary | ICD-10-CM | POA: Insufficient documentation

## 2018-04-16 DIAGNOSIS — S0101XA Laceration without foreign body of scalp, initial encounter: Secondary | ICD-10-CM | POA: Insufficient documentation

## 2018-04-16 DIAGNOSIS — Z7984 Long term (current) use of oral hypoglycemic drugs: Secondary | ICD-10-CM | POA: Diagnosis not present

## 2018-04-16 DIAGNOSIS — X150XXA Contact with hot stove (kitchen), initial encounter: Secondary | ICD-10-CM | POA: Diagnosis not present

## 2018-04-16 DIAGNOSIS — I10 Essential (primary) hypertension: Secondary | ICD-10-CM | POA: Diagnosis not present

## 2018-04-16 DIAGNOSIS — F17210 Nicotine dependence, cigarettes, uncomplicated: Secondary | ICD-10-CM | POA: Diagnosis not present

## 2018-04-16 DIAGNOSIS — Y92009 Unspecified place in unspecified non-institutional (private) residence as the place of occurrence of the external cause: Secondary | ICD-10-CM

## 2018-04-16 DIAGNOSIS — Z9049 Acquired absence of other specified parts of digestive tract: Secondary | ICD-10-CM | POA: Diagnosis not present

## 2018-04-16 DIAGNOSIS — S0990XA Unspecified injury of head, initial encounter: Secondary | ICD-10-CM | POA: Diagnosis present

## 2018-04-16 NOTE — ED Triage Notes (Signed)
Pt brought in by [facility] EMS - called out for fall- EMS reports son was at home, but did not see the fall, found pt on floor and called EMS. Pt has ETOH on board. Pt reports drinking "peach Bernette's".  Pt has no complaints of pain, pt states she "does not want staples. Approx 1.5 inch linear laceration noted to posterior/top portion of scalp. Bleeding controlled.

## 2018-04-17 ENCOUNTER — Emergency Department (HOSPITAL_COMMUNITY): Payer: Medicare Other

## 2018-04-17 NOTE — ED Notes (Signed)
Pt given sprite- son at bedside, reviewed staple care with son and reminded that staples needed to be taken out in one week. Pt and family verbalized understanding.

## 2018-04-17 NOTE — Discharge Instructions (Addendum)
Keep the wound clean and dry.  You can put antibiotic ointment on the staples.  Take Tylenol if needed for pain.  The staples should be removed in 1 week either at your doctor's office or the ED.  Return to the ED for any problems on the head injury sheet.

## 2018-04-17 NOTE — ED Provider Notes (Signed)
Holly May is a 68 y.o. female here with laceration of the scalp s/p fall at home. Dr. Tomi Bamberger is caring for the patient. I did the wound closure.   BP 131/69 (BP Location: Left Arm)   Pulse (!) 56   Temp 97.9 F (36.6 C) (Oral)   Resp 16   Wt 63 kg   SpO2 97%   BMI 21.75 kg/m    LACERATION REPAIR Performed by: [facility] Authorized by: Najeeb Uptain [place] Consent: Verbal consent obtained. Risks and benefits: risks, benefits and alternatives were discussed Consent given by: patient Patient identity confirmed: provided demographic data Prepped and Draped in normal sterile fashion Wound explored  Laceration Location: scalp  Laceration Length: 2 cm  No Foreign Bodies seen or palpated  Anesthesia: None  Irrigation method: syringe Amount of cleaning: standard  Skin closure: staples  Number of sutures: 4  Patient tolerance: Patient tolerated the procedure well with no immediate complications.    Debroah Baller [place], [place] 04/17/18 0105    Rolland Porter, MD 04/17/18 [phone]

## 2018-04-17 NOTE — ED Provider Notes (Signed)
[facility] EMERGENCY DEPARTMENT Provider Note   CSN: [id number] Arrival date & time: 04/16/18  2306  Time seen 12:20 AM   History   Chief Complaint Chief Complaint  Patient presents with  . Laceration    fall   Level 5 caveat due to intoxication   HPI Holly May is a 68 y.o. female.  HPI patient presents via EMS after she fell at home.  Patient states she fell and hit her head on a wood burning stove.  Patient appears to be intoxicated.  She has slurred speech but she is also edentulous with makes it very hard to understand what she is saying.  She denies having any pain or injury.  When asked when her last tetanus was she states last year and then states "I had 3 of them".  PCP Abran Richard, MD   Past Medical History:  Diagnosis Date  . Arthritis   . Depression   . Essential hypertension, benign   . Type 2 diabetes mellitus ([place])   . Urinary incontinence     Patient Active Problem List   Diagnosis Date Noted  . Acute on chronic respiratory failure ([place]) 06/12/2016  . Acute exacerbation of chronic obstructive pulmonary disease (COPD) ([place]) 06/12/2016  . Elevated troponin 06/12/2016  . Hypokalemia 06/12/2016  . Acute renal failure (ARF) ([place]) 06/12/2016  . Preoperative cardiovascular examination 05/18/2014  . Abnormal ECG 05/18/2014  . Humerus shaft fracture 04/06/2013  . Closed fracture of unspecified part of upper end of humerus 04/06/2013  . Syncope 03/31/2013  . Fracture of left humerus 03/31/2013  . DM type 2 (diabetes mellitus, type 2) ([place]) 03/31/2013  . HTN (hypertension) 03/31/2013  . Heme positive stool 03/31/2013  . Normocytic anemia 03/31/2013  . Tobacco abuse 03/31/2013    Past Surgical History:  Procedure Laterality Date  . CHOLECYSTECTOMY    . OPEN REDUCTION INTERNAL FIXATION (ORIF) PROXIMAL PHALANX Left 04/11/2017   Procedure: OPEN REDUCTION INTERNAL FIXATION (ORIF) PROXIMAL PHALANX;  Surgeon: Charlotte Crumb, MD;  Location: [place];  Service: Orthopedics;  Laterality: Left;     OB History   No obstetric history on file.      Home Medications    Prior to Admission medications   Medication Sig Start Date End Date Taking? Authorizing Provider  albuterol (PROVENTIL HFA;VENTOLIN HFA) 108 (90 Base) MCG/ACT inhaler Inhale 2 puffs into the lungs every 4 (four) hours as needed for wheezing or shortness of breath (cough, shortness of breath or wheezing.). 02/18/18   Manuella Ghazi, Pratik D, DO  amLODipine (NORVASC) 10 MG tablet Take 10 mg by mouth daily.    [provider]  citalopram (CELEXA) 20 MG tablet Take 1 tablet (20 mg total) by mouth daily. 06/14/16   Johnson, Clanford L, MD  gabapentin (NEURONTIN) 300 MG capsule Take 600 mg by mouth at bedtime.     [provider]  hydrochlorothiazide (HYDRODIURIL) 25 MG tablet Take 25 mg by mouth daily.    [provider]  ipratropium-albuterol (DUONEB) 0.5-2.5 (3) MG/3ML SOLN Take 3 mLs by nebulization every 4 (four) hours as needed (shortness of breath or wheezing). 02/18/18   Manuella Ghazi, Pratik D, DO  lisinopril (PRINIVIL,ZESTRIL) 40 MG tablet Take 40 mg by mouth daily.    [provider]  lovastatin (MEVACOR) 40 MG tablet Take 40 mg by mouth at bedtime.    [provider]  metFORMIN (GLUCOPHAGE) 1000 MG tablet Take 500 mg by mouth daily with breakfast.  [provider]  metoprolol succinate (TOPROL-XL) 25 MG 24 hr tablet Take 25 mg by mouth daily.    [provider]  oxybutynin (DITROPAN) 5 MG tablet Take 5 mg by mouth 3 (three) times daily.     [provider]  SPIRIVA HANDIHALER 18 MCG inhalation capsule Place 1 puff into inhaler and inhale daily. 01/24/18   [provider]  traZODone (DESYREL) 150 MG tablet Take 1 tablet by mouth at bedtime. 01/27/18   [provider]  Vitamin D, Ergocalciferol, (DRISDOL) 50000 units CAPS capsule Take 50,000 Units by mouth every 7 (seven) days.     [provider]    Family History Family History  Problem Relation Age of Onset  . Diabetes Mellitus II Sister   . Diabetes Mellitus II Mother   . Hypertension Brother   . Hypertension Sister     Social History Social History   Tobacco Use  . Smoking status: Current Every Day Smoker    Packs/day: 1.00    Years: 43.00    Pack years: 43.00    Types: Cigarettes    Start date: 01/09/1971  . Smokeless tobacco: Never Used  Substance Use Topics  . Alcohol use: Yes    Alcohol/week: 0.0 standard drinks  . Drug use: No  Lives at home   Allergies   Patient has no known allergies.   Review of Systems Review of Systems  Unable to perform ROS: Other     Physical Exam Updated Vital Signs BP 131/69 (BP Location: Left Arm)   Pulse (!) 56   Temp 97.9 F (36.6 C) (Oral)   Resp 16   Wt 63 kg   SpO2 97%   BMI 21.75 kg/m   Vital signs normal except for bradycardia   Physical Exam Vitals signs and nursing note reviewed.  Constitutional:      General: She is not in acute distress.    Appearance: Normal appearance. She is well-developed. She is not ill-appearing or toxic-appearing.  HENT:     Head: Normocephalic.     Comments: Patient has a 3 cm linear laceration of her left scalp on the top and slightly posterior.  There is blood in the wound but it is not actively bleeding.    Right Ear: External ear normal.     Left Ear: External ear normal.     Nose: Nose normal. No mucosal edema or rhinorrhea.     Mouth/Throat:     Dentition: No dental abscesses.     Pharynx: No uvula swelling.     Comments: Patient is edentulous which makes her speech very difficult to understand Eyes:     Conjunctiva/sclera: Conjunctivae normal.     Pupils: Pupils are equal, round, and reactive to light.  Neck:     Musculoskeletal: Full passive range of motion without pain, normal range of motion and neck supple.  Cardiovascular:     Rate and Rhythm: Normal rate and regular rhythm.       Heart sounds: Normal heart sounds. No murmur. No friction rub. No gallop.   Pulmonary:     Effort: Pulmonary effort is normal. No respiratory distress.     Breath sounds: Normal breath sounds. No wheezing, rhonchi or rales.  Chest:     Chest wall: No tenderness or crepitus.  Abdominal:     General: Bowel sounds are normal. There is no distension.     Palpations: Abdomen is soft.     Tenderness: There is no abdominal tenderness. There  is no guarding or rebound.  Musculoskeletal: Normal range of motion.        General: No tenderness.     Comments: Moves all extremities well.   Skin:    General: Skin is warm and dry.     Coloration: Skin is not pale.     Findings: No erythema or rash.  Neurological:     Mental Status: She is alert and oriented to person, place, and time.     Cranial Nerves: No cranial nerve deficit.  Psychiatric:        Mood and Affect: Mood normal.        Speech: Speech is slurred.        Behavior: Behavior is slowed.      ED Treatments / Results  Labs (all labs ordered are listed, but only abnormal results are displayed) Labs Reviewed - No data to display  EKG None  Radiology Ct Head Wo Contrast  Result Date: 04/17/2018 CLINICAL DATA:  Posterior head laceration EXAM: CT HEAD WITHOUT CONTRAST TECHNIQUE: Contiguous axial images were obtained from the base of the skull through the vertex without intravenous contrast. COMPARISON:  CT head dated 07/26/2017 FINDINGS: Brain: No evidence of acute infarction, hemorrhage, hydrocephalus, extra-axial collection or mass lesion/mass effect. Left basal ganglia lacunar infarct. Subcortical white matter and periventricular small vessel ischemic changes. Vascular: Intracranial atherosclerosis. Skull: Normal. Negative for fracture or focal lesion. Sinuses/Orbits: The visualized paranasal sinuses are essentially clear. The mastoid air cells are unopacified. Other: Left parietal laceration with skin staples. IMPRESSION: Left  parietal laceration with skin staples. No evidence of acute intracranial abnormality. Left basal ganglia lacunar infarct. Small vessel ischemic changes. Electronically Signed   By: Julian Hy M.D.   On: 04/17/2018 01:27    Procedures Procedures (including critical care time)  Medications Ordered in ED Medications - No data to display   Initial Impression / Assessment and Plan / ED Course  I have reviewed the triage vital signs and the nursing notes.  Pertinent labs & imaging results that were available during my care of the patient were reviewed by me and considered in my medical decision making (see chart for details).     When I look at her medication list in epic I do not see any tetanus booster being given through the ED  Patient was stapled by my nurse practitioner, Janit Bern  1:50 AM patient was able to walk without assistance with steady gait.  Her son is here to take her home.  She denies any pain or injury from her fall other than her laceration.  Her son is here to take her home.  Final Clinical Impressions(s) / ED Diagnoses   Final diagnoses:  Fall in home, initial encounter  Laceration of occipital region of scalp, initial encounter    ED Discharge Orders    None      Plan discharge  Rolland Porter, MD, Barbette Or, MD 04/17/18 [phone]

## 2018-04-17 NOTE — ED Notes (Signed)
Patient transported to CT 

## 2018-04-17 NOTE — ED Notes (Signed)
Pt walked to bathroom with standby assist from this nurse. Pt has steady gait. Pt walked back to room and making phone calls to family to find ride home. Pt is alert and oriented x 4.

## 2018-04-17 NOTE — ED Notes (Signed)
ED Provider at bedside. Dr Knapp 

## 2018-06-03 ENCOUNTER — Ambulatory Visit: Payer: Medicare Other | Admitting: Cardiothoracic Surgery

## 2018-06-03 ENCOUNTER — Encounter: Payer: Self-pay | Admitting: Cardiothoracic Surgery

## 2018-06-03 ENCOUNTER — Other Ambulatory Visit: Payer: Self-pay

## 2018-06-03 VITALS — BP 132/87 | HR 88 | Resp 16 | Ht 67.0 in | Wt 139.2 lb

## 2018-06-03 DIAGNOSIS — E320 Persistent hyperplasia of thymus: Secondary | ICD-10-CM

## 2018-06-03 NOTE — Progress Notes (Signed)
PCP is Abran Richard, MD Referring Provider is Abran Richard, MD  Chief Complaint  Patient presents with  . 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  On her last visit she has not felt to be a operative candidate because of her heavy smoking, poor pulmonary function, and alcohol abuse.  Since that visit she was hospitalized overnight in the ED after she became intoxicated fell and sustained a laceration to her head.  Brain scan showed no evidence of metastatic disease or trauma.  The patient is still smoking about a pack a day.  Her respiratory effort is extremely poor.  She is still not a candidate for partial sternotomy and resection of this tumor.  I told the patient we would keep an eye on this tumor with another scan in 6 months while she should try to stop smoking to improve her pulmonary reserve so that she could tolerate surgery.   Past Medical History:  Diagnosis Date  . Arthritis   . Depression   . Essential hypertension, benign   . Type 2 diabetes mellitus ([place])   . Urinary incontinence     Past Surgical History:  Procedure Laterality Date  . CHOLECYSTECTOMY    . OPEN REDUCTION INTERNAL FIXATION (ORIF) PROXIMAL PHALANX Left 04/11/2017   Procedure: OPEN REDUCTION INTERNAL FIXATION (ORIF) PROXIMAL PHALANX;  Surgeon: Charlotte Crumb, MD;  Location: [place];  Service: Orthopedics;  Laterality: Left;    Family History  Problem Relation Age of Onset  . Diabetes Mellitus II Sister   . Diabetes Mellitus II Mother   . Hypertension Brother   . Hypertension Sister     Social History Social History   Tobacco Use  . Smoking status: Current Every Day Smoker    Packs/day: 1.00    Years: 43.00    Pack years: 43.00    Types: Cigarettes    Start date: 01/09/1971  . Smokeless tobacco: Never Used  Substance Use Topics  .  Alcohol use: Yes    Alcohol/week: 0.0 standard drinks  . Drug use: No    Current Outpatient Medications  Medication Sig Dispense Refill  . albuterol (PROVENTIL HFA;VENTOLIN HFA) 108 (90 Base) MCG/ACT inhaler Inhale 2 puffs into the lungs every 4 (four) hours as needed for wheezing or shortness of breath (cough, shortness of breath or wheezing.). 1 Inhaler 3  . amLODipine (NORVASC) 10 MG tablet Take 10 mg by mouth daily.    . citalopram (CELEXA) 20 MG tablet Take 1 tablet (20 mg total) by mouth daily. 30 tablet 0  . gabapentin (NEURONTIN) 300 MG capsule Take 600 mg by mouth at bedtime.     . hydrochlorothiazide (HYDRODIURIL) 25 MG tablet Take 25 mg by mouth daily.    Marland Kitchen ipratropium-albuterol (DUONEB) 0.5-2.5 (3) MG/3ML SOLN Take 3 mLs by nebulization every 4 (four) hours as needed (shortness of breath or wheezing). 360 mL 3  . lisinopril (PRINIVIL,ZESTRIL) 40 MG tablet Take 40 mg by mouth daily.    Marland Kitchen lovastatin (MEVACOR) 40 MG tablet Take 40 mg by mouth at bedtime.    . metFORMIN (GLUCOPHAGE) 1000 MG tablet Take 500 mg by mouth daily with breakfast.     . metoprolol succinate (TOPROL-XL) 25 MG 24 hr tablet Take 25 mg by mouth daily.    Marland Kitchen oxybutynin (DITROPAN) 5 MG tablet Take 5 mg by mouth 3 (three) times daily.     Marland Kitchen  SPIRIVA HANDIHALER 18 MCG inhalation capsule Place 1 puff into inhaler and inhale daily.  4  . traZODone (DESYREL) 150 MG tablet Take 1 tablet by mouth at bedtime.  5  . Vitamin D, Ergocalciferol, (DRISDOL) 50000 units CAPS capsule Take 50,000 Units by mouth every 7 (seven) days.     No current facility-administered medications for this visit.     No Known Allergies  Review of Systems  No chest pain Weight stable Still smoking and drinking alcohol to excess BP 132/87 (BP Location: Right Arm, Patient Position: Sitting, Cuff Size: Large)   Pulse 88   Resp 16   Ht 5\' 7"  (1.702 m)   Wt 139 lb 3.2 oz (63.1 kg)   SpO2 97% Comment: RA  BMI 21.80 kg/m  Physical  Exam Chronically ill appearing somewhat disheveled Distant breath sounds scattered rhonchi Heart rhythm regular No palpable cervical adenopathy No abdominal mass or tenderness No focal motor deficit  Diagnostic Tests: No new scans.  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  We will continue to follow with another scan while hopefully she can improve her pulmonary status by smoking cessation Plan: Return in 6 months with CT of chest with IV contrast to follow mediastinal mass  Len Childs, MD [facility] [phone]

## 2018-11-30 ENCOUNTER — Other Ambulatory Visit: Payer: Self-pay | Admitting: *Deleted

## 2018-11-30 DIAGNOSIS — E320 Persistent hyperplasia of thymus: Secondary | ICD-10-CM

## 2019-01-20 ENCOUNTER — Ambulatory Visit
Admission: RE | Admit: 2019-01-20 | Discharge: 2019-01-20 | Disposition: A | Discharge status: Home / Self Care | Payer: Medicare Other | Source: Ambulatory Visit | Attending: Cardiothoracic Surgery | Admitting: Cardiothoracic Surgery

## 2019-01-20 ENCOUNTER — Encounter: Payer: Self-pay | Admitting: Cardiothoracic Surgery

## 2019-01-20 ENCOUNTER — Ambulatory Visit: Payer: Medicare Other | Admitting: Cardiothoracic Surgery

## 2019-01-20 ENCOUNTER — Other Ambulatory Visit: Payer: Self-pay

## 2019-01-20 VITALS — BP 160/82 | HR 80 | Temp 97.7°F | Resp 20 | Ht 67.0 in | Wt 141.0 lb

## 2019-01-20 DIAGNOSIS — J9859 Other diseases of mediastinum, not elsewhere classified: Secondary | ICD-10-CM

## 2019-01-20 DIAGNOSIS — E320 Persistent hyperplasia of thymus: Secondary | ICD-10-CM

## 2019-01-20 MED ORDER — IOPAMIDOL (ISOVUE-300) INJECTION 61%
75.0000 mL | Freq: Once | INTRAVENOUS | Status: AC | PRN
  Administered 2019-01-20: 75 mL via INTRAVENOUS

## 2019-01-20 NOTE — Progress Notes (Signed)
PCP is Abran Richard, MD Referring Provider is Abran Richard, MD  Chief Complaint  Patient presents with  . 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  On her last visit the patient had been having problems with excessive alcohol drinking and was also a heavy smoker.  She has cut back on her drinking since then.  There have been no visits to the emergency department since January 2020 for consequences of her drinking.  She appears much stronger with improved exercise tolerance.  Today she walked up and down the hallway in the office 100 feet and her oxygen saturation room air remained at 96%.  CT scan of the chest performed today personally reviewed and discussed with patient.  The mediastinal mass is slightly enlarged.  It is now 3.5 cm wide and 5.5 cm long.  There is some evidence of mild right lower lobe bronchitis-aspiration.  Patient plans on having her 4 remaining teeth pulled in 1 week. She understands that she needs to cut her smoking down to less than 1/2 pack/day before we schedule surgery.  Because the mass has significant size and is slowly enlarging I feel the patient definitely would benefit from having a partial sternotomy and resection although with clear effort made to minimize her operative risk which is mainly pulmonary.    Past Medical History:  Diagnosis Date  . Arthritis   . Depression   . Essential hypertension, benign   . Type 2 diabetes mellitus ([place])   . Urinary incontinence     Past Surgical History:  Procedure Laterality Date  . CHOLECYSTECTOMY    . OPEN REDUCTION INTERNAL FIXATION (ORIF) PROXIMAL PHALANX Left 04/11/2017   Procedure: OPEN REDUCTION INTERNAL FIXATION (ORIF) PROXIMAL PHALANX;  Surgeon: Charlotte Crumb, MD;  Location: [facility];  Service: Orthopedics;  Laterality: Left;    Family History  Problem  Relation Age of Onset  . Diabetes Mellitus II Sister   . Diabetes Mellitus II Mother   . Hypertension Brother   . Hypertension Sister     Social History Social History   Tobacco Use  . Smoking status: Current Every Day Smoker    Packs/day: 1.00    Years: 43.00    Pack years: 43.00    Types: Cigarettes    Start date: 01/09/1971  . Smokeless tobacco: Never Used  Substance Use Topics  . Alcohol use: Yes    Alcohol/week: 0.0 standard drinks  . Drug use: No    Current Outpatient Medications  Medication Sig Dispense Refill  . albuterol (PROVENTIL HFA;VENTOLIN HFA) 108 (90 Base) MCG/ACT inhaler Inhale 2 puffs into the lungs every 4 (four) hours as needed for wheezing or shortness of breath (cough, shortness of breath or wheezing.). 1 Inhaler 3  . amLODipine (NORVASC) 10 MG tablet Take 10 mg by mouth daily.    . citalopram (CELEXA) 20 MG tablet Take 1 tablet (20 mg total) by mouth daily. 30 tablet 0  . gabapentin (NEURONTIN) 300 MG capsule Take 600 mg by mouth at bedtime.     . hydrochlorothiazide (HYDRODIURIL) 25 MG tablet Take 25 mg by mouth daily.    Marland Kitchen ipratropium-albuterol (DUONEB) 0.5-2.5 (3) MG/3ML SOLN Take 3 mLs by nebulization every 4 (four) hours as needed (shortness of breath or wheezing). 360 mL 3  . lisinopril (PRINIVIL,ZESTRIL) 40 MG tablet Take 40 mg by mouth daily.    Marland Kitchen  lovastatin (MEVACOR) 40 MG tablet Take 40 mg by mouth at bedtime.    . metFORMIN (GLUCOPHAGE) 1000 MG tablet Take 500 mg by mouth daily with breakfast.     . metoprolol succinate (TOPROL-XL) 25 MG 24 hr tablet Take 25 mg by mouth daily.    Marland Kitchen oxybutynin (DITROPAN) 5 MG tablet Take 5 mg by mouth 3 (three) times daily.     Marland Kitchen SPIRIVA HANDIHALER 18 MCG inhalation capsule Place 1 puff into inhaler and inhale daily.  4  . traZODone (DESYREL) 150 MG tablet Take 1 tablet by mouth at bedtime.  5  . Vitamin D, Ergocalciferol, (DRISDOL) 50000 units CAPS capsule Take 50,000 Units by mouth every 7 (seven) days.      No current facility-administered medications for this visit.     No Known Allergies  Review of Systems   Is trying to stop smoking completely but has been unsuccessful.  She will try to reduce intake from 1 pack/day to 1/2 pack/day.  Weight has been stable No fever No falls No bleeding No syncope, seizures No edema  BP (!) 160/82   Pulse 80   Temp 97.7 F (36.5 C) (Temporal)   Resp 20   Ht 5\' 7"  (1.702 m)   Wt 141 lb (64 kg)   SpO2 91% Comment: RA  BMI 22.08 kg/m  Physical Exam Chronically ill, mildly ill kept but not acutely ill appearing female Scattered rhonchi Heart rate regular without murmur Neck without JVD or cervical adenopathy Thorax without asymmetry or tenderness Abdomen soft nontender Extremities with some clubbing but no edema Good peripheral pulses No focal motor deficit, normal gait  Diagnostic Tests: Images of CT scan again reviewed with patient showing a slowly enlarging anterior mediastinal mass of significant size  Impression: It appears that the patient has improved.  She is closer to meeting criteria for partial sternotomy and resection of the anterior mediastinal mass.  After she has her dental extractions and is able to reduce her smoking I will see her back in 1 month and hopefully be able to schedule a date for surgery.  Plan: Return November 11 chest x-ray.   Len Childs, MD [facility] [phone]

## 2019-02-09 ENCOUNTER — Other Ambulatory Visit: Payer: Self-pay | Admitting: Cardiothoracic Surgery

## 2019-02-09 DIAGNOSIS — J9859 Other diseases of mediastinum, not elsewhere classified: Secondary | ICD-10-CM

## 2019-02-10 ENCOUNTER — Ambulatory Visit
Admission: RE | Admit: 2019-02-10 | Discharge: 2019-02-10 | Disposition: A | Discharge status: Home / Self Care | Payer: Medicare Other | Source: Ambulatory Visit | Attending: Cardiothoracic Surgery | Admitting: Cardiothoracic Surgery

## 2019-02-10 ENCOUNTER — Encounter: Payer: Self-pay | Admitting: Cardiothoracic Surgery

## 2019-02-10 ENCOUNTER — Other Ambulatory Visit: Payer: Self-pay

## 2019-02-10 ENCOUNTER — Encounter: Payer: Self-pay | Admitting: *Deleted

## 2019-02-10 ENCOUNTER — Ambulatory Visit (INDEPENDENT_AMBULATORY_CARE_PROVIDER_SITE_OTHER): Payer: Medicare Other | Admitting: Cardiothoracic Surgery

## 2019-02-10 ENCOUNTER — Other Ambulatory Visit: Payer: Self-pay | Admitting: *Deleted

## 2019-02-10 VITALS — BP 139/75 | HR 69 | Temp 97.5°F | Resp 16 | Ht 67.0 in | Wt 144.2 lb

## 2019-02-10 DIAGNOSIS — J9859 Other diseases of mediastinum, not elsewhere classified: Secondary | ICD-10-CM | POA: Diagnosis not present

## 2019-02-10 DIAGNOSIS — Z720 Tobacco use: Secondary | ICD-10-CM

## 2019-02-10 MED ORDER — AZITHROMYCIN 250 MG PO TABS: ORAL_TABLET | ORAL | 0 refills | Status: DC

## 2019-02-10 NOTE — Progress Notes (Signed)
PCP is Abran Richard, MD Referring Provider is Abran Richard, MD  Chief Complaint  Patient presents with  . Follow-up    3 wk to discuss surgery/mediastinal mass/evaluate smoking status    HPI: Scheduled office visit with follow-up.  Patient has a 3 x 5 cm anterior mediastinal mass which is been slowly increasing in size.  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  She has no symptoms of myasthenia.  She has had history o smoking with COPD and history of heavy alcohol use.  Both agents have been used sparingly over the past several weeks.  She is smoking less than 1/2 pack/day.  She is drinking 2 beers a day and no hard liquor.  I walked the patient up and down the hallway about 150 feet and she showed no signs of tachycardia shortness of breath or desaturation.  Patient would benefit from partial sternotomy for resection of the anterior mediastinal mass if her PFTs are adequate for thoracic surgery.  She will be scheduled for surgery on November 16 with preop Covid testing and PFTs. I have prescribed a course of oral azithromycin for mild of productive cough.  She had a chest x-ray performed today which shows no evidence of pneumonia pleural effusion or airspace disease. The anterior mediastinal density remains. Past Medical History:  Diagnosis Date  . Arthritis   . Depression   . Essential hypertension, benign   . Type 2 diabetes mellitus ([place])   . Urinary incontinence     Past Surgical History:  Procedure Laterality Date  . CHOLECYSTECTOMY    . OPEN REDUCTION INTERNAL FIXATION (ORIF) PROXIMAL PHALANX Left 04/11/2017   Procedure: OPEN REDUCTION INTERNAL FIXATION (ORIF) PROXIMAL PHALANX;  Surgeon: Charlotte Crumb, MD;  Location: [facility];  Service: Orthopedics;  Laterality: Left;    Family History  Problem Relation Age of Onset  . Diabetes Mellitus II Sister   . Diabetes Mellitus II Mother   . Hypertension Brother   . Hypertension Sister     Social  History Social History   Tobacco Use  . Smoking status: Current Every Day Smoker    Packs/day: 1.00    Years: 43.00    Pack years: 43.00    Types: Cigarettes    Start date: 01/09/1971  . Smokeless tobacco: Never Used  Substance Use Topics  . Alcohol use: Yes    Alcohol/week: 0.0 standard drinks  . Drug use: No    Current Outpatient Medications  Medication Sig Dispense Refill  . albuterol (PROVENTIL HFA;VENTOLIN HFA) 108 (90 Base) MCG/ACT inhaler Inhale 2 puffs into the lungs every 4 (four) hours as needed for wheezing or shortness of breath (cough, shortness of breath or wheezing.). 1 Inhaler 3  . amLODipine (NORVASC) 10 MG tablet Take 10 mg by mouth daily.    . citalopram (CELEXA) 20 MG tablet Take 1 tablet (20 mg total) by mouth daily. 30 tablet 0  . gabapentin (NEURONTIN) 300 MG capsule Take 600 mg by mouth at bedtime.     . hydrochlorothiazide (HYDRODIURIL) 25 MG tablet Take 25 mg by mouth daily.    Marland Kitchen ipratropium-albuterol (DUONEB) 0.5-2.5 (3) MG/3ML SOLN Take 3 mLs by nebulization every 4 (four) hours as needed (shortness of breath or wheezing). 360 mL 3  . lisinopril (PRINIVIL,ZESTRIL) 40 MG tablet Take 40 mg by mouth daily.    Marland Kitchen lovastatin (MEVACOR) 40 MG tablet Take 40 mg by mouth at bedtime.    . metFORMIN (GLUCOPHAGE) 1000 MG tablet Take 500 mg by  mouth daily with breakfast.     . metoprolol succinate (TOPROL-XL) 25 MG 24 hr tablet Take 25 mg by mouth daily.    Marland Kitchen oxybutynin (DITROPAN) 5 MG tablet Take 5 mg by mouth 3 (three) times daily.     Marland Kitchen SPIRIVA HANDIHALER 18 MCG inhalation capsule Place 1 puff into inhaler and inhale daily.  4  . traZODone (DESYREL) 150 MG tablet Take 1 tablet by mouth at bedtime.  5  . Vitamin D, Ergocalciferol, (DRISDOL) 50000 units CAPS capsule Take 50,000 Units by mouth every 7 (seven) days.    Marland Kitchen azithromycin (ZITHROMAX Z-PAK) 250 MG tablet 2 tablets the first day then 1 tablet daily until finished 6 each 0   No current facility-administered  medications for this visit.     No Known Allergies  Review of Systems   Since last visit the patient had her dental extractions and has done well.  She is starting to regain weight. Mild shortness of breath with exertion No fever No chest pain  BP 139/75 (BP Location: Left Arm, Patient Position: Sitting, Cuff Size: Normal)   Pulse 69   Temp (!) 97.5 F (36.4 C)   Resp 16   Ht 5\' 7"  (1.702 m)   Wt 144 lb 3.2 oz (65.4 kg)   SpO2 90% Comment: RA  BMI 22.58 kg/m  Physical Exam      Exam    General- alert and comfortable, appears chronically ill    Neck- no JVD, no cervical adenopathy palpable, no carotid bruit   Lungs- clear without rales, wheezes   Cor- regular rate and rhythm, no murmur , gallop   Abdomen- soft, non-tender   Extremities - warm, non-tender, minimal edema   Neuro- oriented, appropriate, no focal weakness   Diagnostic Tests: Chest x-ray image today personally reviewed showing no infiltrates Patient had a Cardiolite scan 2016 showing normal EF 65%, no evidence of ischemia.  Impression: Slowly growing anterior mediastinal mass Patient's pulmonary status appears to be stable with PFTs pending.  Plan: We will tentatively schedule patient for surgery on Monday, November 16 for partial sternotomy and resection of anterior mediastinal mass.  I discussed the procedure, expected recovery, potential risks which are mainly pulmonary for this patient.   Len Childs, MD [facility] [phone]

## 2019-02-11 ENCOUNTER — Other Ambulatory Visit (HOSPITAL_COMMUNITY): Payer: Medicare Other

## 2019-02-13 ENCOUNTER — Other Ambulatory Visit (HOSPITAL_COMMUNITY)
Admission: RE | Admit: 2019-02-13 | Discharge: 2019-02-13 | Disposition: A | Discharge status: Home / Self Care | Payer: Medicare Other | Source: Ambulatory Visit | Attending: Cardiothoracic Surgery | Admitting: Cardiothoracic Surgery

## 2019-02-13 DIAGNOSIS — Z01812 Encounter for preprocedural laboratory examination: Secondary | ICD-10-CM | POA: Diagnosis present

## 2019-02-13 DIAGNOSIS — Z20828 Contact with and (suspected) exposure to other viral communicable diseases: Secondary | ICD-10-CM | POA: Diagnosis not present

## 2019-02-14 LAB — NOVEL CORONAVIRUS, NAA (HOSP ORDER, SEND-OUT TO REF LAB; TAT 18-24 HRS): SARS-CoV-2, NAA: NOT DETECTED

## 2019-02-15 ENCOUNTER — Other Ambulatory Visit: Payer: Self-pay

## 2019-02-15 ENCOUNTER — Other Ambulatory Visit (HOSPITAL_COMMUNITY)
Admission: RE | Admit: 2019-02-15 | Discharge: 2019-02-15 | Disposition: A | Discharge status: Home / Self Care | Payer: Medicare Other | Source: Ambulatory Visit | Attending: Cardiothoracic Surgery | Admitting: Cardiothoracic Surgery

## 2019-02-15 DIAGNOSIS — Z20828 Contact with and (suspected) exposure to other viral communicable diseases: Secondary | ICD-10-CM | POA: Insufficient documentation

## 2019-02-15 LAB — SARS CORONAVIRUS 2 (TAT 6-24 HRS): SARS Coronavirus 2: NEGATIVE

## 2019-02-15 NOTE — Progress Notes (Signed)
[place], [place] [address] Phone: [phone] Fax: [phone]  [place], [place] - [address] [place] [address] Phone: [phone] Fax: [phone]      Your procedure is scheduled on Thursday, 02/18/2019.  Report to [facility] Main Entrance "A" at 05:30 A.M., and check in at the Admitting office.  Call this number if you have problems the morning of surgery:  [phone]   Call [phone] if you have any questions prior to your surgery date Monday-Friday 8am-4pm    Remember:  Do not eat or drink after midnight the night before your surgery   Take these medicines the morning of surgery with A SIP OF WATER: Amlodipine (Norvasc) Citalopram (Celexa) Metoprolol succinate (Toprol-XL) Oxybutynin (Ditropan) Spiriva Handihaler   7 days prior to surgery STOP taking any Aspirin (unless otherwise instructed by your surgeon), Aleve, Naproxen, Ibuprofen, Motrin, Advil, Goody's, BC's, all herbal medications, fish oil, and all vitamins.   WHAT DO I DO ABOUT MY DIABETES MEDICATION?  Marland Kitchen Do not take oral diabetes medicines (pills) the morning of surgery. - DO NOT take your metformin the day of surgery   How to Manage Your Diabetes Before and After Surgery  Why is it important to control my blood sugar before and after surgery? . Improving blood sugar levels before and after surgery helps healing and can limit problems. . A way of improving blood sugar control is eating a healthy diet by: o  Eating less sugar and carbohydrates o  Increasing activity/exercise o  Talking with your doctor about reaching your blood sugar goals . High blood sugars (greater than 180 mg/dL) can raise your risk of infections and slow your recovery, so you will need to focus on controlling your diabetes during the weeks before surgery. . Make sure that the doctor who takes care of  your diabetes knows about your planned surgery including the date and location.  How do I manage my blood sugar before surgery? . Check your blood sugar at least 4 times a day, starting 2 days before surgery, to make sure that the level is not too high or low. . Check your blood sugar the morning of your surgery when you wake up and every 2 hours until you get to the Short Stay unit. o If your blood sugar is less than 70 mg/dL, you will need to treat for low blood sugar: o Do not take insulin. o Treat a low blood sugar (less than 70 mg/dL) with  cup of clear juice (cranberry or apple), 4 glucose tablets, OR glucose gel. o Recheck blood sugar in 15 minutes after treatment (to make sure it is greater than 70 mg/dL). If your blood sugar is not greater than 70 mg/dL on recheck, call [phone] for further instructions. . Report your blood sugar to the short stay nurse when you get to Short Stay.  . If you are admitted to the hospital after surgery: o Your blood sugar will be checked by the staff and you will probably be given insulin after surgery (instead of oral diabetes medicines) to make sure you have good blood sugar levels. o The goal for blood sugar control after surgery is 80-180 mg/dL.    The Morning of Surgery  Do not wear jewelry, make-up or nail polish.  Do not wear lotions, powders, perfumes, or deodorant  Do not shave 48 hours prior to surgery.  Do not bring valuables to the hospital.  [facility] is not responsible for any belongings or valuables.  If you are a smoker, DO NOT Smoke 24 hours prior to surgery  If you wear a CPAP at night please bring your mask, tubing, and machine the morning of surgery   Remember that you must have someone to transport you home after your surgery, and remain with you for 24 hours if you are discharged the same day.   Contacts, eyeglasses, hearing aids, dentures or bridgework may not be worn into surgery.  Bring eyeglasses case or hearing  aid case with you the day or surgery.   Leave your suitcase in the car.  After surgery it may be brought to your room.  For patients admitted to the hospital, discharge time will be determined by your treatment team.  Patients discharged the day of surgery will not be allowed to drive home.    Special instructions:   [place]- Preparing For Surgery  Before surgery, you can play an important role. Because skin is not sterile, your skin needs to be as free of germs as possible. You can reduce the number of germs on your skin by washing with CHG (chlorahexidine gluconate) Soap before surgery.  CHG is an antiseptic cleaner which kills germs and bonds with the skin to continue killing germs even after washing.    Oral Hygiene is also important to reduce your risk of infection.  Remember - BRUSH YOUR TEETH THE MORNING OF SURGERY WITH YOUR REGULAR TOOTHPASTE  Please do not use if you have an allergy to CHG or antibacterial soaps. If your skin becomes reddened/irritated stop using the CHG.  Do not shave (including legs and underarms) for at least 48 hours prior to first CHG shower. It is OK to shave your face.  Please follow these instructions carefully.   1. Shower the NIGHT BEFORE SURGERY and the MORNING OF SURGERY with CHG Soap.   2. If you chose to wash your hair, wash your hair first as usual with your normal shampoo.  3. After you shampoo, rinse your hair and body thoroughly to remove the shampoo.  4. Use CHG as you would any other liquid soap. You can apply CHG directly to the skin and wash gently with a scrungie or a clean washcloth.   5. Apply the CHG Soap to your body ONLY FROM THE NECK DOWN.  Do not use on open wounds or open sores. Avoid contact with your eyes, ears, mouth and genitals (private parts). Wash Face and genitals (private parts)  with your normal soap.   6. Wash thoroughly, paying special attention to the area where your surgery will be performed.  7. Thoroughly  rinse your body with warm water from the neck down.  8. DO NOT shower/wash with your normal soap after using and rinsing off the CHG Soap.  9. Pat yourself dry with a CLEAN TOWEL.  10. Wear CLEAN PAJAMAS to bed the night before surgery, wear comfortable clothes the morning of surgery  11. Place CLEAN SHEETS on your bed the night of your first shower and DO NOT SLEEP WITH PETS.    Day of Surgery:  Please shower the morning of surgery with the CHG soap  Do not apply any deodorants/lotions. Please wear clean clothes to the hospital/surgery center.   Remember to brush your teeth WITH YOUR REGULAR TOOTHPASTE.   Please read over the following fact sheets that you were given.

## 2019-02-16 ENCOUNTER — Encounter (HOSPITAL_COMMUNITY)
Admission: RE | Admit: 2019-02-16 | Discharge: 2019-02-16 | Disposition: A | Discharge status: Home / Self Care | Payer: Medicare Other | Source: Ambulatory Visit | Attending: Cardiothoracic Surgery | Admitting: Cardiothoracic Surgery

## 2019-02-16 ENCOUNTER — Other Ambulatory Visit: Payer: Self-pay

## 2019-02-16 ENCOUNTER — Ambulatory Visit (HOSPITAL_COMMUNITY)
Admission: RE | Admit: 2019-02-16 | Discharge: 2019-02-16 | Disposition: A | Discharge status: Home / Self Care | Payer: Medicare Other | Source: Ambulatory Visit | Attending: Cardiothoracic Surgery | Admitting: Cardiothoracic Surgery

## 2019-02-16 ENCOUNTER — Encounter (HOSPITAL_COMMUNITY): Payer: Self-pay

## 2019-02-16 DIAGNOSIS — J9859 Other diseases of mediastinum, not elsewhere classified: Secondary | ICD-10-CM

## 2019-02-16 HISTORY — DX: Dyspnea, unspecified: R06.00

## 2019-02-16 HISTORY — DX: Chronic obstructive pulmonary disease, unspecified: J44.9

## 2019-02-16 LAB — BLOOD GAS, ARTERIAL
Acid-Base Excess: 3.6 mmol/L — ABNORMAL HIGH (ref 0.0–2.0)
Bicarbonate: 27.9 mmol/L (ref 20.0–28.0)
Drawn by: 42180
FIO2: 21
O2 Saturation: 95 %
Patient temperature: 37
pCO2 arterial: 44.2 mmHg (ref 32.0–48.0)
pH, Arterial: 7.417 (ref 7.350–7.450)
pO2, Arterial: 76.1 mmHg — ABNORMAL LOW (ref 83.0–108.0)

## 2019-02-16 LAB — CBC
HCT: 41.4 % (ref 36.0–46.0)
Hemoglobin: 13.6 g/dL (ref 12.0–15.0)
MCH: 32.4 pg (ref 26.0–34.0)
MCHC: 32.9 g/dL (ref 30.0–36.0)
MCV: 98.6 fL (ref 80.0–100.0)
Platelets: 276 10*3/uL (ref 150–400)
RBC: 4.2 MIL/uL (ref 3.87–5.11)
RDW: 13.9 % (ref 11.5–15.5)
WBC: 11.2 10*3/uL — ABNORMAL HIGH (ref 4.0–10.5)
nRBC: 0 % (ref 0.0–0.2)

## 2019-02-16 LAB — PULMONARY FUNCTION TEST
DL/VA % pred: 86 %
DL/VA: 3.53 ml/min/mmHg/L
DLCO cor % pred: 49 %
DLCO cor: 10.81 ml/min/mmHg
DLCO unc % pred: 49 %
DLCO unc: 10.88 ml/min/mmHg
FEF 25-75 Post: 0.54 L/sec
FEF 25-75 Pre: 0.43 L/sec
FEF2575-%Change-Post: 24 %
FEF2575-%Pred-Post: 26 %
FEF2575-%Pred-Pre: 20 %
FEV1-%Change-Post: 7 %
FEV1-%Pred-Post: 39 %
FEV1-%Pred-Pre: 36 %
FEV1-Post: 0.87 L
FEV1-Pre: 0.81 L
FEV1FVC-%Change-Post: -21 %
FEV1FVC-%Pred-Pre: 75 %
FEV6-%Change-Post: 38 %
FEV6-%Pred-Post: 67 %
FEV6-%Pred-Pre: 48 %
FEV6-Post: 1.85 L
FEV6-Pre: 1.34 L
FEV6FVC-%Change-Post: 0 %
FEV6FVC-%Pred-Post: 102 %
FEV6FVC-%Pred-Pre: 103 %
FVC-%Change-Post: 36 %
FVC-%Pred-Post: 65 %
FVC-%Pred-Pre: 48 %
FVC-Post: 1.87 L
FVC-Pre: 1.37 L
Post FEV1/FVC ratio: 46 %
Post FEV6/FVC ratio: 99 %
Pre FEV1/FVC ratio: 59 %
Pre FEV6/FVC Ratio: 100 %
RV % pred: 299 %
RV: 6.76 L
TLC % pred: 151 %
TLC: 8.36 L

## 2019-02-16 LAB — COMPREHENSIVE METABOLIC PANEL
ALT: 11 U/L (ref 0–44)
AST: 14 U/L — ABNORMAL LOW (ref 15–41)
Albumin: 3.5 g/dL (ref 3.5–5.0)
Alkaline Phosphatase: 65 U/L (ref 38–126)
Anion gap: 11 (ref 5–15)
BUN: 17 mg/dL (ref 8–23)
CO2: 24 mmol/L (ref 22–32)
Calcium: 9.2 mg/dL (ref 8.9–10.3)
Chloride: 100 mmol/L (ref 98–111)
Creatinine, Ser: 1.31 mg/dL — ABNORMAL HIGH (ref 0.44–1.00)
GFR calc Af Amer: 49 mL/min — ABNORMAL LOW (ref >60)
GFR calc non Af Amer: 42 mL/min — ABNORMAL LOW (ref >60)
Glucose, Bld: 102 mg/dL — ABNORMAL HIGH (ref 70–99)
Potassium: 3.4 mmol/L — ABNORMAL LOW (ref 3.5–5.1)
Sodium: 135 mmol/L (ref 135–145)
Total Bilirubin: 0.4 mg/dL (ref 0.3–1.2)
Total Protein: 6.6 g/dL (ref 6.5–8.1)

## 2019-02-16 LAB — URINALYSIS, ROUTINE W REFLEX MICROSCOPIC
Bilirubin Urine: NEGATIVE
Glucose, UA: NEGATIVE mg/dL
Hgb urine dipstick: NEGATIVE
Ketones, ur: NEGATIVE mg/dL
Leukocytes,Ua: NEGATIVE
Nitrite: NEGATIVE
Protein, ur: NEGATIVE mg/dL
Specific Gravity, Urine: 1.025 (ref 1.005–1.030)
pH: 5 (ref 5.0–8.0)

## 2019-02-16 LAB — APTT: aPTT: 29 seconds (ref 24–36)

## 2019-02-16 LAB — GLUCOSE, CAPILLARY: Glucose-Capillary: 134 mg/dL — ABNORMAL HIGH (ref 70–99)

## 2019-02-16 LAB — HEMOGLOBIN A1C
Hgb A1c MFr Bld: 6.1 % — ABNORMAL HIGH (ref 4.8–5.6)
Mean Plasma Glucose: 128.37 mg/dL

## 2019-02-16 LAB — SURGICAL PCR SCREEN
MRSA, PCR: NEGATIVE
Staphylococcus aureus: NEGATIVE

## 2019-02-16 LAB — PROTIME-INR
INR: 0.9 (ref 0.8–1.2)
Prothrombin Time: 11.8 seconds (ref 11.4–15.2)

## 2019-02-16 LAB — ABO/RH: ABO/RH(D): A POS

## 2019-02-16 MED ORDER — ALBUTEROL SULFATE (2.5 MG/3ML) 0.083% IN NEBU
2.5000 mg | INHALATION_SOLUTION | Freq: Once | RESPIRATORY_TRACT | Status: AC
  Administered 2019-02-16: 2.5 mg via RESPIRATORY_TRACT

## 2019-02-16 NOTE — Progress Notes (Addendum)
PCP - Dr. Abran Richard Cardiologist - patient denies  PPM/ICD - n/a Device Orders -  Rep Notified -   Chest x-ray - 02/16/2019 EKG - 02/16/2019 Stress Test - 2016 ECHO - 2015 Cardiac Cath - patient denies  Sleep Study - patient denies CPAP -   Fasting Blood Sugar - patient states she does not have a meter at home Checks Blood Sugar _____ times a day  Blood Thinner Instructions: n/a Aspirin Instructions: n/a  ERAS Protcol - n/a PRE-SURGERY Ensure or G2-   COVID TEST- negative on 02/15/2019   Anesthesia review: yes, hx of cardiac testing  Patient denies shortness of breath, fever, cough and chest pain at PAT appointment  Patient states her medications are pre-packaged and she cannot distinguish them apart.  Patient instructed to hold her home meds the morning of surgery.  She will need to receive her metoprolol in short stay DOS.  All instructions explained to the patient, with a verbal understanding of the material. Patient agrees to go over the instructions while at home for a better understanding. Patient also instructed to self quarantine after being tested for COVID-19. The opportunity to ask questions was provided.

## 2019-02-17 MED ORDER — CEFAZOLIN SODIUM-DEXTROSE 2-4 GM/100ML-% IV SOLN
2.0000 g | INTRAVENOUS | Status: AC
  Administered 2019-02-18: 2 g via INTRAVENOUS
  Filled 2019-02-17: qty 100

## 2019-02-17 MED ORDER — VANCOMYCIN HCL 1000 MG IV SOLR
Freq: Once | INTRAVENOUS | Status: DC | PRN
  Filled 2019-02-17: qty 1000

## 2019-02-17 NOTE — Anesthesia Preprocedure Evaluation (Addendum)
Anesthesia Evaluation  Patient identified by MRN, date of birth, ID band Patient awake    Reviewed: Allergy & Precautions, NPO status , Patient's Chart, lab work & pertinent test results  History of Anesthesia Complications Negative for: history of anesthetic complications  Airway Mallampati: II  TM Distance: >3 FB Neck ROM: Full    Dental  (+) Edentulous Upper, Edentulous Lower, Dental Advisory Given   Pulmonary shortness of breath, COPD,  COPD inhaler, Current Smoker and Patient abstained from smoking.,    + rhonchi        Cardiovascular hypertension, Pt. on medications and Pt. on home beta blockers (-) angina(-) Past MI and (-) CHF  Rhythm:Regular     Neuro/Psych PSYCHIATRIC DISORDERS Depression negative neurological ROS     GI/Hepatic negative GI ROS, Neg liver ROS,   Endo/Other  diabetes  Renal/GU Renal InsufficiencyRenal disease     Musculoskeletal  (+) Arthritis ,   Abdominal   Peds  Hematology negative hematology ROS (+)   Anesthesia Other Findings   Reproductive/Obstetrics                           Anesthesia Physical Anesthesia Plan  ASA: III  Anesthesia Plan: General   Post-op Pain Management:    Induction: Intravenous  PONV Risk Score and Plan: 2 and Ondansetron and Dexamethasone  Airway Management Planned: Oral ETT  Additional Equipment: Arterial line, CVP and Ultrasound Guidance Line Placement  Intra-op Plan:   Post-operative Plan: Extubation in OR and Possible Post-op intubation/ventilation  Informed Consent: I have reviewed the patients History and Physical, chart, labs and discussed the procedure including the risks, benefits and alternatives for the proposed anesthesia with the patient or authorized representative who has indicated his/her understanding and acceptance.     Dental advisory given  Plan Discussed with: CRNA and Surgeon  Anesthesia Plan  Comments: (See PAT note by Karoline Caldwell, PA-C )       Anesthesia Quick Evaluation

## 2019-02-17 NOTE — Progress Notes (Signed)
Anesthesia Chart Review:  Case: [id number] Date/Time: 02/18/19 0716   Procedures:      STERNOTOMY (N/A )     RESECTION OF MEDIASTINAL MASS (N/A )   Anesthesia type: General   Pre-op diagnosis: MEDIASTINAL MASS   Location: [facility] OR ROOM 14 / [place] [facility]   Surgeon: Ivin Poot, MD      DISCUSSION: Pt was referred to cardiology in 2017 for eval of possible dilated thoracic aorta seen on chest xray. Dr. Domenic Polite ordered chest CT to better evaluate. Per his comment on results 05/25/15 "The aorta is tortuous and ectatic but there is no definite aneurysm or dissection which was the main question for this study. She does have evidence of atherosclerosis within the aorta and also the coronary arteries, however stress testing from last year did not show any active ischemia, and I would recommend continuing medical therapy and observation at this time." Dr. Myles Gip notes also state patient has chronically abnormal EKG. Review of EKG at that time does show lateral T wave inversions which are re-demonstrated on EKG from 02/16/19.  Dr. Lucianne Lei Trigt's note from 02/10/19 states pt had mildly productive cough and he treated her with course of azithromycin. Pt denied SOB or cough at PAT appt.   Reviewed chart with Dr. Smith Robert due to abnormal EKG. He reviewed tracing from 02/16/19 and tracing from 05/19/15 and felt there was minimal change. Advised that as long as her functional status is similar then okay to proceed as planned.   I spoke with patient by phone to clarify her functional status. She is not very active at baseline. She reports chronic DOE but says it is stable. She says she does get SOB going up stairs but this has not changed for several years. She does her own shopping and can walk on level ground without issue. Per Dr. Lucianne Lei Trigt's note the pt was walked in the office and did not have tachycardia or desturation. She says her cough improved after azithromycin prescribed by Dr. Prescott Gum but reports she has some  chronic cough that is stable.   Anticipate pt can proceed as planned barring acute status change.   VS: BP 135/78   Pulse 79   Temp 36.9 C (Oral)   Resp 17   Ht 5\' 9"  (1.753 m)   Wt 67.7 kg   SpO2 99%   BMI 22.05 kg/m   PROVIDERS: Abran Richard, MD is PCP   LABS: Preop labs reviewed, mild renal insufficiency creatinine 1.31. Well controlled DMII with A1c 6.1. (all labs ordered are listed, but only abnormal results are displayed)  Labs Reviewed  GLUCOSE, CAPILLARY - Abnormal; Notable for the following components:      Result Value   Glucose-Capillary 134 (*)    All other components within normal limits  BLOOD GAS, ARTERIAL - Abnormal; Notable for the following components:   pO2, Arterial 76.1 (*)    Acid-Base Excess 3.6 (*)    All other components within normal limits  CBC - Abnormal; Notable for the following components:   WBC 11.2 (*)    All other components within normal limits  COMPREHENSIVE METABOLIC PANEL - Abnormal; Notable for the following components:   Potassium 3.4 (*)    Glucose, Bld 102 (*)    Creatinine, Ser 1.31 (*)    AST 14 (*)    GFR calc non Af Amer 42 (*)    GFR calc Af Amer 49 (*)    All other components within normal limits  HEMOGLOBIN A1C - Abnormal; Notable for the following components:   Hgb A1c MFr Bld 6.1 (*)    All other components within normal limits  SURGICAL PCR SCREEN  APTT  PROTIME-INR  URINALYSIS, ROUTINE W REFLEX MICROSCOPIC  TYPE AND SCREEN  ABO/RH     IMAGES: CHEST - 2 VIEW 02/16/19  COMPARISON:  February 10, 2019  FINDINGS: There is a new linear airspace opacity at the right lung base. No pneumothorax. No large pleural effusion. There are old healed rib fractures. Aortic calcifications are noted. There is no acute osseous abnormality. The patient's known anterior mediastinal mass is better visualized on prior CT. The lungs are hyperexpanded.  IMPRESSION: 1. New linear airspace opacity at the right lung base  may represent atelectasis or pneumonia. 2. Hyperexpanded lungs similar to prior study. 3. The known anterior mediastinal mass is better visualized on prior CT. Dr. Prescott Gum notified of abnormal CXR.   EKG: EKG 02/16/19: Normal sinus rhythm. Rate 64. Septal infarct , age undetermined. Left ventricular hypertrophy with repolarization abnormality U waves present. ST & T wave abnormality, consider inferolateral ischemia Lateral TWI is new. May be related to LVH but cannot exclude ischemia.  PULM: PFTs 02/17/2019  Ref. Range 02/16/2019 15:23  FVC-Pre Latest Units: L 1.37  FVC-%Pred-Pre Latest Units: % 48  FEV1-Pre Latest Units: L 0.81  FEV1-%Pred-Pre Latest Units: % 36  Pre FEV1/FVC ratio Latest Units: % 59  FEV1FVC-%Pred-Pre Latest Units: % 75  TLC Latest Units: L 8.36  TLC % pred Latest Units: % 151  DLCO cor Latest Units: ml/min/mmHg 10.81  DLCO cor % pred Latest Units: % 49  DLCO unc Latest Units: ml/min/mmHg 10.88  DLCO unc % pred Latest Units: % 49  DL/VA Latest Units: ml/min/mmHg/L 3.53  DL/VA % pred Latest Units: % 86     CV: Nuclear stress 05/26/14: IMPRESSION: 1. No reversible ischemia or infarction. Hypertensive response noted.  2. Normal left ventricular wall motion.  3. Left ventricular ejection fraction 64%  4. Low-risk stress test findings*.  Echocardiogram 04/02/2013: Study Conclusions  - Left ventricle: The cavity size was normal. Wall thickness was increased in a pattern of mild to moderate LVH. Systolic function was normal. The estimated ejection fraction was in the range of 60% to 65%. Wall motion was normal; there were no regional wall motion abnormalities. Doppler parameters are consistent with abnormal left ventricular relaxation (grade 1 diastolic dysfunction). - Aortic valve: Mildly calcified annulus. Trileaflet. No significant regurgitation. Mean gradient: 10mm Hg (S). - Mitral valve: Calcified annulus. Trivial  regurgitation. - Right atrium: Central venous pressure: 35mm Hg (est). - Tricuspid valve: Trivial regurgitation. - Pulmonary arteries: Systolic pressure could not be accurately estimated. - Pericardium, extracardiac: A prominent pericardial fat pad was present. A small pericardial effusion was identified anterior to the heart. No evidence of tamponade. Impressions:  - No prior study for comparison. Mild to moderate LVH with LVEF [id number], grade 1 diastolic dysfunction. Trivial mitral and tricuspid regurgitation. Unable to assess PASP. Small anterior pericardial effusion - no obvious hemodynamic significance at this point.  Past Medical History:  Diagnosis Date  . Arthritis   . COPD (chronic obstructive pulmonary disease) ([place])   . Depression   . Dyspnea   . Essential hypertension, benign   . Type 2 diabetes mellitus ([place])   . Urinary incontinence     Past Surgical History:  Procedure Laterality Date  . CHOLECYSTECTOMY    . FRACTURE SURGERY Left    arm  . OPEN REDUCTION  INTERNAL FIXATION (ORIF) PROXIMAL PHALANX Left 04/11/2017   Procedure: OPEN REDUCTION INTERNAL FIXATION (ORIF) PROXIMAL PHALANX;  Surgeon: Charlotte Crumb, MD;  Location: [facility];  Service: Orthopedics;  Laterality: Left;    MEDICATIONS: . albuterol (PROVENTIL HFA;VENTOLIN HFA) 108 (90 Base) MCG/ACT inhaler  . amLODipine (NORVASC) 10 MG tablet  . azithromycin (ZITHROMAX Z-PAK) 250 MG tablet  . citalopram (CELEXA) 20 MG tablet  . gabapentin (NEURONTIN) 300 MG capsule  . hydrochlorothiazide (HYDRODIURIL) 25 MG tablet  . ipratropium-albuterol (DUONEB) 0.5-2.5 (3) MG/3ML SOLN  . lisinopril (PRINIVIL,ZESTRIL) 40 MG tablet  . lovastatin (MEVACOR) 40 MG tablet  . metFORMIN (GLUCOPHAGE) 1000 MG tablet  . metoprolol succinate (TOPROL-XL) 25 MG 24 hr tablet  . oxybutynin (DITROPAN) 5 MG tablet  . SPIRIVA HANDIHALER 18 MCG inhalation capsule  . traZODone (DESYREL) 150 MG tablet  .  Vitamin D, Ergocalciferol, (DRISDOL) 50000 units CAPS capsule   No current facility-administered medications for this encounter.

## 2019-02-18 ENCOUNTER — Inpatient Hospital Stay (HOSPITAL_COMMUNITY): Payer: Medicare Other | Admitting: Physician Assistant

## 2019-02-18 ENCOUNTER — Inpatient Hospital Stay (HOSPITAL_COMMUNITY): Payer: Medicare Other

## 2019-02-18 ENCOUNTER — Other Ambulatory Visit: Payer: Self-pay

## 2019-02-18 ENCOUNTER — Encounter (HOSPITAL_COMMUNITY): Payer: Self-pay

## 2019-02-18 ENCOUNTER — Inpatient Hospital Stay (HOSPITAL_COMMUNITY): Payer: Medicare Other | Admitting: Certified Registered Nurse Anesthetist

## 2019-02-18 ENCOUNTER — Encounter (HOSPITAL_COMMUNITY)
Admission: RE | Disposition: A | Discharge status: Home / Self Care | Payer: Self-pay | Source: Home / Self Care | Attending: Cardiothoracic Surgery

## 2019-02-18 ENCOUNTER — Inpatient Hospital Stay (HOSPITAL_COMMUNITY)
Admission: RE | Admit: 2019-02-18 | Discharge: 2019-02-22 | DRG: 827 | Disposition: A | Discharge status: Home / Self Care | Payer: Medicare Other | Attending: Cardiothoracic Surgery | Admitting: Cardiothoracic Surgery

## 2019-02-18 DIAGNOSIS — J939 Pneumothorax, unspecified: Secondary | ICD-10-CM

## 2019-02-18 DIAGNOSIS — F17210 Nicotine dependence, cigarettes, uncomplicated: Secondary | ICD-10-CM | POA: Diagnosis present

## 2019-02-18 DIAGNOSIS — J449 Chronic obstructive pulmonary disease, unspecified: Secondary | ICD-10-CM | POA: Diagnosis present

## 2019-02-18 DIAGNOSIS — Z8249 Family history of ischemic heart disease and other diseases of the circulatory system: Secondary | ICD-10-CM | POA: Diagnosis not present

## 2019-02-18 DIAGNOSIS — J9859 Other diseases of mediastinum, not elsewhere classified: Secondary | ICD-10-CM

## 2019-02-18 DIAGNOSIS — C781 Secondary malignant neoplasm of mediastinum: Secondary | ICD-10-CM | POA: Diagnosis present

## 2019-02-18 DIAGNOSIS — Z833 Family history of diabetes mellitus: Secondary | ICD-10-CM | POA: Diagnosis not present

## 2019-02-18 DIAGNOSIS — E119 Type 2 diabetes mellitus without complications: Secondary | ICD-10-CM | POA: Diagnosis present

## 2019-02-18 DIAGNOSIS — I10 Essential (primary) hypertension: Secondary | ICD-10-CM | POA: Diagnosis present

## 2019-02-18 DIAGNOSIS — Z9689 Presence of other specified functional implants: Secondary | ICD-10-CM

## 2019-02-18 DIAGNOSIS — F1010 Alcohol abuse, uncomplicated: Secondary | ICD-10-CM | POA: Diagnosis present

## 2019-02-18 DIAGNOSIS — Z09 Encounter for follow-up examination after completed treatment for conditions other than malignant neoplasm: Secondary | ICD-10-CM

## 2019-02-18 DIAGNOSIS — C37 Malignant neoplasm of thymus: Principal | ICD-10-CM | POA: Diagnosis present

## 2019-02-18 DIAGNOSIS — Z20828 Contact with and (suspected) exposure to other viral communicable diseases: Secondary | ICD-10-CM | POA: Diagnosis present

## 2019-02-18 DIAGNOSIS — R222 Localized swelling, mass and lump, trunk: Secondary | ICD-10-CM | POA: Diagnosis present

## 2019-02-18 DIAGNOSIS — F329 Major depressive disorder, single episode, unspecified: Secondary | ICD-10-CM | POA: Diagnosis present

## 2019-02-18 HISTORY — PX: STERNOTOMY: SHX1057

## 2019-02-18 HISTORY — PX: RESECTION OF MEDIASTINAL MASS: SHX6497

## 2019-02-18 LAB — BASIC METABOLIC PANEL
Anion gap: 8 (ref 5–15)
BUN: 12 mg/dL (ref 8–23)
CO2: 27 mmol/L (ref 22–32)
Calcium: 8.9 mg/dL (ref 8.9–10.3)
Chloride: 100 mmol/L (ref 98–111)
Creatinine, Ser: 0.88 mg/dL (ref 0.44–1.00)
GFR calc Af Amer: >60 mL/min (ref >60)
GFR calc non Af Amer: >60 mL/min (ref >60)
Glucose, Bld: 195 mg/dL — ABNORMAL HIGH (ref 70–99)
Potassium: 4.1 mmol/L (ref 3.5–5.1)
Sodium: 135 mmol/L (ref 135–145)

## 2019-02-18 LAB — CBC
HCT: 40.4 % (ref 36.0–46.0)
Hemoglobin: 13.6 g/dL (ref 12.0–15.0)
MCH: 33.4 pg (ref 26.0–34.0)
MCHC: 33.7 g/dL (ref 30.0–36.0)
MCV: 99.3 fL (ref 80.0–100.0)
Platelets: 276 10*3/uL (ref 150–400)
RBC: 4.07 MIL/uL (ref 3.87–5.11)
RDW: 13.8 % (ref 11.5–15.5)
WBC: 15.5 10*3/uL — ABNORMAL HIGH (ref 4.0–10.5)
nRBC: 0 % (ref 0.0–0.2)

## 2019-02-18 LAB — POCT I-STAT 7, (LYTES, BLD GAS, ICA,H+H)
Acid-Base Excess: 1 mmol/L (ref 0.0–2.0)
Acid-Base Excess: 1 mmol/L (ref 0.0–2.0)
Bicarbonate: 27.7 mmol/L (ref 20.0–28.0)
Bicarbonate: 27.3 mmol/L (ref 20.0–28.0)
Bicarbonate: 26.1 mmol/L (ref 20.0–28.0)
Calcium, Ion: 1.23 mmol/L (ref 1.15–1.40)
Calcium, Ion: 1.19 mmol/L (ref 1.15–1.40)
Calcium, Ion: 1.15 mmol/L (ref 1.15–1.40)
HCT: 42 % (ref 36.0–46.0)
HCT: 37 % (ref 36.0–46.0)
HCT: 35 % — ABNORMAL LOW (ref 36.0–46.0)
Hemoglobin: 14.3 g/dL (ref 12.0–15.0)
Hemoglobin: 12.6 g/dL (ref 12.0–15.0)
Hemoglobin: 11.9 g/dL — ABNORMAL LOW (ref 12.0–15.0)
O2 Saturation: 98 %
O2 Saturation: 98 %
O2 Saturation: 96 %
Patient temperature: 36.6
Patient temperature: 37.3
Patient temperature: 37.5
Potassium: 3.8 mmol/L (ref 3.5–5.1)
Potassium: 4.1 mmol/L (ref 3.5–5.1)
Potassium: 3.9 mmol/L (ref 3.5–5.1)
Sodium: 136 mmol/L (ref 135–145)
Sodium: 135 mmol/L (ref 135–145)
Sodium: 135 mmol/L (ref 135–145)
TCO2: 29 mmol/L (ref 22–32)
TCO2: 29 mmol/L (ref 22–32)
TCO2: 27 mmol/L (ref 22–32)
pCO2 arterial: 57.7 mmHg — ABNORMAL HIGH (ref 32.0–48.0)
pCO2 arterial: 50.9 mmHg — ABNORMAL HIGH (ref 32.0–48.0)
pCO2 arterial: 43.4 mmHg (ref 32.0–48.0)
pH, Arterial: 7.288 — ABNORMAL LOW (ref 7.350–7.450)
pH, Arterial: 7.339 — ABNORMAL LOW (ref 7.350–7.450)
pH, Arterial: 7.389 (ref 7.350–7.450)
pO2, Arterial: 113 mmHg — ABNORMAL HIGH (ref 83.0–108.0)
pO2, Arterial: 112 mmHg — ABNORMAL HIGH (ref 83.0–108.0)
pO2, Arterial: 88 mmHg (ref 83.0–108.0)

## 2019-02-18 LAB — GLUCOSE, CAPILLARY
Glucose-Capillary: 102 mg/dL — ABNORMAL HIGH (ref 70–99)
Glucose-Capillary: 141 mg/dL — ABNORMAL HIGH (ref 70–99)
Glucose-Capillary: 134 mg/dL — ABNORMAL HIGH (ref 70–99)
Glucose-Capillary: 120 mg/dL — ABNORMAL HIGH (ref 70–99)
Glucose-Capillary: 154 mg/dL — ABNORMAL HIGH (ref 70–99)

## 2019-02-18 LAB — PROTIME-INR
INR: 0.9 (ref 0.8–1.2)
Prothrombin Time: 12.3 seconds (ref 11.4–15.2)

## 2019-02-18 LAB — BLOOD GAS, ARTERIAL
Acid-Base Excess: 1.4 mmol/L (ref 0.0–2.0)
Bicarbonate: 28.1 mmol/L — ABNORMAL HIGH (ref 20.0–28.0)
O2 Saturation: 98.8 %
Patient temperature: 37
RATE: 6 resp/min
pCO2 arterial: 68.4 mmHg (ref 32.0–48.0)
pH, Arterial: 7.238 — ABNORMAL LOW (ref 7.350–7.450)
pO2, Arterial: 227 mmHg — ABNORMAL HIGH (ref 83.0–108.0)

## 2019-02-18 LAB — PREPARE RBC (CROSSMATCH)

## 2019-02-18 LAB — APTT: aPTT: 33 seconds (ref 24–36)

## 2019-02-18 SURGERY — STERNOTOMY
Anesthesia: General | Site: Chest

## 2019-02-18 MED ORDER — FENTANYL CITRATE (PF) 250 MCG/5ML IJ SOLN
INTRAMUSCULAR | Status: AC
  Filled 2019-02-18: qty 5

## 2019-02-18 MED ORDER — PHENYLEPHRINE 40 MCG/ML (10ML) SYRINGE FOR IV PUSH (FOR BLOOD PRESSURE SUPPORT)
PREFILLED_SYRINGE | INTRAVENOUS | Status: DC | PRN
  Administered 2019-02-18: 40 ug via INTRAVENOUS
  Administered 2019-02-18: 80 ug via INTRAVENOUS

## 2019-02-18 MED ORDER — KETOROLAC TROMETHAMINE 30 MG/ML IJ SOLN
INTRAMUSCULAR | Status: AC
  Filled 2019-02-18: qty 1

## 2019-02-18 MED ORDER — HEMOSTATIC AGENTS (NO CHARGE) OPTIME
TOPICAL | Status: DC | PRN
  Administered 2019-02-18: 2 via TOPICAL

## 2019-02-18 MED ORDER — AMLODIPINE BESYLATE 5 MG PO TABS
10.0000 mg | ORAL_TABLET | Freq: Once | ORAL | Status: AC
  Administered 2019-02-18: 10 mg via ORAL
  Filled 2019-02-18: qty 2

## 2019-02-18 MED ORDER — FUROSEMIDE 10 MG/ML IJ SOLN
40.0000 mg | Freq: Once | INTRAMUSCULAR | Status: AC
  Administered 2019-02-18: 40 mg via INTRAVENOUS
  Filled 2019-02-18: qty 4

## 2019-02-18 MED ORDER — HYDROCHLOROTHIAZIDE 25 MG PO TABS
25.0000 mg | ORAL_TABLET | Freq: Every day | ORAL | Status: DC
  Administered 2019-02-19 – 2019-02-22 (×4): 25 mg via ORAL
  Filled 2019-02-18 (×4): qty 1

## 2019-02-18 MED ORDER — LEVALBUTEROL HCL 0.63 MG/3ML IN NEBU
0.6300 mg | INHALATION_SOLUTION | Freq: Three times a day (TID) | RESPIRATORY_TRACT | Status: DC | PRN
  Administered 2019-02-21: 0.63 mg via RESPIRATORY_TRACT
  Filled 2019-02-18: qty 3

## 2019-02-18 MED ORDER — OXYCODONE HCL 5 MG PO TABS: 5.0000 mg | ORAL_TABLET | Freq: Once | ORAL | Status: DC | PRN

## 2019-02-18 MED ORDER — HYDRALAZINE HCL 20 MG/ML IJ SOLN
10.0000 mg | INTRAMUSCULAR | Status: DC | PRN
  Administered 2019-02-18: 10 mg via INTRAVENOUS
  Filled 2019-02-18 (×2): qty 1

## 2019-02-18 MED ORDER — LABETALOL HCL 5 MG/ML IV SOLN
INTRAVENOUS | Status: AC
  Filled 2019-02-18: qty 4

## 2019-02-18 MED ORDER — ONDANSETRON HCL 4 MG/2ML IJ SOLN
INTRAMUSCULAR | Status: AC
  Filled 2019-02-18: qty 2

## 2019-02-18 MED ORDER — LACTATED RINGERS IV SOLN: INTRAVENOUS | Status: DC

## 2019-02-18 MED ORDER — DEXTROSE 50 % IV SOLN: 0.0000 mL | INTRAVENOUS | Status: DC | PRN

## 2019-02-18 MED ORDER — SODIUM CHLORIDE 0.9 % IV SOLN: 250.0000 mL | INTRAVENOUS | Status: DC

## 2019-02-18 MED ORDER — PANTOPRAZOLE SODIUM 40 MG PO TBEC
40.0000 mg | DELAYED_RELEASE_TABLET | Freq: Every day | ORAL | Status: DC
  Administered 2019-02-20 – 2019-02-22 (×3): 40 mg via ORAL
  Filled 2019-02-18 (×3): qty 1

## 2019-02-18 MED ORDER — CHLORHEXIDINE GLUCONATE CLOTH 2 % EX PADS
6.0000 | MEDICATED_PAD | Freq: Every day | CUTANEOUS | Status: DC
  Administered 2019-02-18 – 2019-02-20 (×2): 6 via TOPICAL

## 2019-02-18 MED ORDER — UMECLIDINIUM BROMIDE 62.5 MCG/INH IN AEPB
1.0000 | INHALATION_SPRAY | Freq: Every day | RESPIRATORY_TRACT | Status: DC
  Administered 2019-02-19 – 2019-02-21 (×3): 1 via RESPIRATORY_TRACT
  Filled 2019-02-18 (×2): qty 7

## 2019-02-18 MED ORDER — SODIUM CHLORIDE 0.9 % IV SOLN: INTRAVENOUS | Status: DC

## 2019-02-18 MED ORDER — ACETAMINOPHEN 10 MG/ML IV SOLN: 1000.0000 mg | Freq: Once | INTRAVENOUS | Status: DC | PRN

## 2019-02-18 MED ORDER — SODIUM CHLORIDE 0.9% FLUSH: 3.0000 mL | INTRAVENOUS | Status: DC | PRN

## 2019-02-18 MED ORDER — ACETAMINOPHEN 160 MG/5ML PO SOLN: 1000.0000 mg | Freq: Once | ORAL | Status: DC | PRN

## 2019-02-18 MED ORDER — DOCUSATE SODIUM 100 MG PO CAPS
200.0000 mg | ORAL_CAPSULE | Freq: Every day | ORAL | Status: DC
  Administered 2019-02-19 – 2019-02-22 (×4): 200 mg via ORAL
  Filled 2019-02-18 (×4): qty 2

## 2019-02-18 MED ORDER — KETOROLAC TROMETHAMINE 30 MG/ML IJ SOLN
INTRAMUSCULAR | Status: DC | PRN
  Administered 2019-02-18: 15 mg via INTRAVENOUS

## 2019-02-18 MED ORDER — LACTATED RINGERS IV SOLN
INTRAVENOUS | Status: DC | PRN
  Administered 2019-02-18: 07:00:00 via INTRAVENOUS

## 2019-02-18 MED ORDER — PRAVASTATIN SODIUM 40 MG PO TABS
40.0000 mg | ORAL_TABLET | Freq: Every day | ORAL | Status: DC
  Administered 2019-02-19 – 2019-02-21 (×3): 40 mg via ORAL
  Filled 2019-02-18 (×3): qty 1

## 2019-02-18 MED ORDER — METOCLOPRAMIDE HCL 5 MG/ML IJ SOLN
10.0000 mg | Freq: Four times a day (QID) | INTRAMUSCULAR | Status: AC
  Administered 2019-02-18 – 2019-02-19 (×6): 10 mg via INTRAVENOUS
  Filled 2019-02-18 (×6): qty 2

## 2019-02-18 MED ORDER — FENTANYL CITRATE (PF) 250 MCG/5ML IJ SOLN
INTRAMUSCULAR | Status: DC | PRN
  Administered 2019-02-18: 50 ug via INTRAVENOUS
  Administered 2019-02-18: 25 ug via INTRAVENOUS
  Administered 2019-02-18 (×3): 50 ug via INTRAVENOUS
  Administered 2019-02-18: 75 ug via INTRAVENOUS
  Administered 2019-02-18: 100 ug via INTRAVENOUS

## 2019-02-18 MED ORDER — VANCOMYCIN HCL IN DEXTROSE 1-5 GM/200ML-% IV SOLN
1000.0000 mg | Freq: Once | INTRAVENOUS | Status: AC
  Administered 2019-02-18: 1000 mg via INTRAVENOUS
  Filled 2019-02-18: qty 200

## 2019-02-18 MED ORDER — TRAMADOL HCL 50 MG PO TABS
50.0000 mg | ORAL_TABLET | ORAL | Status: DC | PRN
  Administered 2019-02-21: 50 mg via ORAL
  Filled 2019-02-18 (×2): qty 1

## 2019-02-18 MED ORDER — ALBUMIN HUMAN 5 % IV SOLN
12.5000 g | Freq: Once | INTRAVENOUS | Status: AC
  Administered 2019-02-18: 12.5 g via INTRAVENOUS
  Filled 2019-02-18: qty 250

## 2019-02-18 MED ORDER — LABETALOL HCL 5 MG/ML IV SOLN
INTRAVENOUS | Status: DC | PRN
  Administered 2019-02-18: 10 mg via INTRAVENOUS
  Administered 2019-02-18: 5 mg via INTRAVENOUS
  Administered 2019-02-18: 2.5 mg via INTRAVENOUS

## 2019-02-18 MED ORDER — ACETAMINOPHEN 500 MG PO TABS
1000.0000 mg | ORAL_TABLET | Freq: Four times a day (QID) | ORAL | Status: DC
  Administered 2019-02-18 – 2019-02-22 (×14): 1000 mg via ORAL
  Filled 2019-02-18 (×14): qty 2

## 2019-02-18 MED ORDER — PHENYLEPHRINE HCL-NACL 10-0.9 MG/250ML-% IV SOLN
INTRAVENOUS | Status: DC | PRN
  Administered 2019-02-18: 20 ug/min via INTRAVENOUS

## 2019-02-18 MED ORDER — 0.9 % SODIUM CHLORIDE (POUR BTL) OPTIME
TOPICAL | Status: DC | PRN
  Administered 2019-02-18: 2000 mL
  Administered 2019-02-18: 1000 mL

## 2019-02-18 MED ORDER — MIDAZOLAM HCL 2 MG/2ML IJ SOLN
INTRAMUSCULAR | Status: AC
  Filled 2019-02-18: qty 2

## 2019-02-18 MED ORDER — CITALOPRAM HYDROBROMIDE 20 MG PO TABS
20.0000 mg | ORAL_TABLET | Freq: Every day | ORAL | Status: DC
  Administered 2019-02-19 – 2019-02-22 (×4): 20 mg via ORAL
  Filled 2019-02-18 (×4): qty 1

## 2019-02-18 MED ORDER — TRAZODONE HCL 50 MG PO TABS
150.0000 mg | ORAL_TABLET | Freq: Every day | ORAL | Status: DC
  Administered 2019-02-18 – 2019-02-21 (×4): 150 mg via ORAL
  Filled 2019-02-18 (×4): qty 3

## 2019-02-18 MED ORDER — ACETAMINOPHEN 160 MG/5ML PO SOLN: 650.0000 mg | Freq: Once | ORAL | Status: DC

## 2019-02-18 MED ORDER — METOPROLOL SUCCINATE ER 25 MG PO TB24
25.0000 mg | ORAL_TABLET | Freq: Every day | ORAL | Status: DC
  Administered 2019-02-19 – 2019-02-22 (×4): 25 mg via ORAL
  Filled 2019-02-18 (×4): qty 1

## 2019-02-18 MED ORDER — DEXAMETHASONE SODIUM PHOSPHATE 10 MG/ML IJ SOLN
INTRAMUSCULAR | Status: DC | PRN
  Administered 2019-02-18: 10 mg via INTRAVENOUS

## 2019-02-18 MED ORDER — METOPROLOL SUCCINATE ER 25 MG PO TB24
25.0000 mg | ORAL_TABLET | Freq: Once | ORAL | Status: AC
  Administered 2019-02-18: 06:00:00 25 mg via ORAL
  Filled 2019-02-18: qty 1

## 2019-02-18 MED ORDER — OXYCODONE HCL 5 MG/5ML PO SOLN: 5.0000 mg | Freq: Once | ORAL | Status: DC | PRN

## 2019-02-18 MED ORDER — PROPOFOL 10 MG/ML IV BOLUS
INTRAVENOUS | Status: DC | PRN
  Administered 2019-02-18 (×2): 20 mg via INTRAVENOUS
  Administered 2019-02-18: 80 mg via INTRAVENOUS
  Administered 2019-02-18 (×3): 20 mg via INTRAVENOUS

## 2019-02-18 MED ORDER — ACETAMINOPHEN 160 MG/5ML PO SOLN: 1000.0000 mg | Freq: Four times a day (QID) | ORAL | Status: DC

## 2019-02-18 MED ORDER — PHENYLEPHRINE HCL-NACL 20-0.9 MG/250ML-% IV SOLN: 0.0000 ug/min | INTRAVENOUS | Status: DC

## 2019-02-18 MED ORDER — AMLODIPINE BESYLATE 10 MG PO TABS
10.0000 mg | ORAL_TABLET | Freq: Every day | ORAL | Status: DC
  Administered 2019-02-19 – 2019-02-22 (×4): 10 mg via ORAL
  Filled 2019-02-18 (×4): qty 1

## 2019-02-18 MED ORDER — LIDOCAINE 2% (20 MG/ML) 5 ML SYRINGE
INTRAMUSCULAR | Status: AC
  Filled 2019-02-18: qty 5

## 2019-02-18 MED ORDER — METOPROLOL SUCCINATE ER 25 MG PO TB24
ORAL_TABLET | ORAL | Status: AC
  Administered 2019-02-18: 25 mg via ORAL
  Filled 2019-02-18: qty 1

## 2019-02-18 MED ORDER — BISACODYL 5 MG PO TBEC
10.0000 mg | DELAYED_RELEASE_TABLET | Freq: Every day | ORAL | Status: DC
  Administered 2019-02-19 – 2019-02-21 (×3): 10 mg via ORAL
  Filled 2019-02-18 (×3): qty 2

## 2019-02-18 MED ORDER — PROPOFOL 10 MG/ML IV BOLUS
INTRAVENOUS | Status: AC
  Filled 2019-02-18: qty 20

## 2019-02-18 MED ORDER — ACETAMINOPHEN 500 MG PO TABS: 1000.0000 mg | ORAL_TABLET | Freq: Once | ORAL | Status: DC | PRN

## 2019-02-18 MED ORDER — FENTANYL CITRATE (PF) 100 MCG/2ML IJ SOLN
50.0000 ug | INTRAMUSCULAR | Status: DC | PRN
  Administered 2019-02-18: 100 ug via INTRAVENOUS
  Filled 2019-02-18: qty 2

## 2019-02-18 MED ORDER — GABAPENTIN 300 MG PO CAPS: 600.0000 mg | ORAL_CAPSULE | Freq: Every day | ORAL | Status: DC

## 2019-02-18 MED ORDER — SODIUM CHLORIDE 0.45 % IV SOLN
INTRAVENOUS | Status: DC | PRN
  Administered 2019-02-18: 12:00:00 via INTRAVENOUS

## 2019-02-18 MED ORDER — ONDANSETRON HCL 4 MG/2ML IJ SOLN: 4.0000 mg | Freq: Four times a day (QID) | INTRAMUSCULAR | Status: DC | PRN

## 2019-02-18 MED ORDER — CEFAZOLIN SODIUM 1 G IJ SOLR
INTRAMUSCULAR | Status: AC
  Filled 2019-02-18: qty 30

## 2019-02-18 MED ORDER — VITAMIN B-1 100 MG PO TABS
100.0000 mg | ORAL_TABLET | Freq: Every day | ORAL | Status: DC
  Administered 2019-02-19 – 2019-02-22 (×4): 100 mg via ORAL
  Filled 2019-02-18 (×4): qty 1

## 2019-02-18 MED ORDER — NICOTINE 14 MG/24HR TD PT24
14.0000 mg | MEDICATED_PATCH | Freq: Every day | TRANSDERMAL | Status: DC
  Administered 2019-02-18 – 2019-02-22 (×5): 14 mg via TRANSDERMAL
  Filled 2019-02-18 (×5): qty 1

## 2019-02-18 MED ORDER — MIDAZOLAM HCL 2 MG/2ML IJ SOLN: 2.0000 mg | INTRAMUSCULAR | Status: DC | PRN

## 2019-02-18 MED ORDER — FAMOTIDINE IN NACL 20-0.9 MG/50ML-% IV SOLN
20.0000 mg | Freq: Two times a day (BID) | INTRAVENOUS | Status: DC
  Administered 2019-02-18: 20 mg via INTRAVENOUS
  Filled 2019-02-18: qty 50

## 2019-02-18 MED ORDER — MIDAZOLAM HCL 5 MG/5ML IJ SOLN
INTRAMUSCULAR | Status: DC | PRN
  Administered 2019-02-18 (×2): 1 mg via INTRAVENOUS

## 2019-02-18 MED ORDER — LIDOCAINE 2% (20 MG/ML) 5 ML SYRINGE
INTRAMUSCULAR | Status: DC | PRN
  Administered 2019-02-18: 60 mg via INTRAVENOUS

## 2019-02-18 MED ORDER — LACTATED RINGERS IV SOLN
INTRAVENOUS | Status: DC | PRN
  Administered 2019-02-18 (×2): via INTRAVENOUS

## 2019-02-18 MED ORDER — TIOTROPIUM BROMIDE MONOHYDRATE 18 MCG IN CAPS: 18.0000 ug | ORAL_CAPSULE | Freq: Every day | RESPIRATORY_TRACT | Status: DC

## 2019-02-18 MED ORDER — OXYBUTYNIN CHLORIDE 5 MG PO TABS
5.0000 mg | ORAL_TABLET | Freq: Three times a day (TID) | ORAL | Status: DC
  Administered 2019-02-18 – 2019-02-22 (×11): 5 mg via ORAL
  Filled 2019-02-18 (×14): qty 1

## 2019-02-18 MED ORDER — KETOROLAC TROMETHAMINE 15 MG/ML IJ SOLN
15.0000 mg | Freq: Four times a day (QID) | INTRAMUSCULAR | Status: AC
  Administered 2019-02-18 – 2019-02-19 (×4): 15 mg via INTRAVENOUS
  Filled 2019-02-18 (×4): qty 1

## 2019-02-18 MED ORDER — ROCURONIUM BROMIDE 10 MG/ML (PF) SYRINGE
PREFILLED_SYRINGE | INTRAVENOUS | Status: AC
  Filled 2019-02-18: qty 10

## 2019-02-18 MED ORDER — ONDANSETRON HCL 4 MG/2ML IJ SOLN
INTRAMUSCULAR | Status: DC | PRN
  Administered 2019-02-18: 4 mg via INTRAVENOUS

## 2019-02-18 MED ORDER — NITROGLYCERIN IN D5W 200-5 MCG/ML-% IV SOLN
0.0000 ug/min | INTRAVENOUS | Status: DC
  Administered 2019-02-18: 10 ug/min via INTRAVENOUS
  Administered 2019-02-19: 60 ug/min via INTRAVENOUS
  Filled 2019-02-18 (×2): qty 250

## 2019-02-18 MED ORDER — OXYCODONE HCL 5 MG PO TABS
5.0000 mg | ORAL_TABLET | ORAL | Status: DC | PRN
  Administered 2019-02-18 – 2019-02-19 (×2): 5 mg via ORAL
  Filled 2019-02-18 (×2): qty 1

## 2019-02-18 MED ORDER — INSULIN ASPART 100 UNIT/ML ~~LOC~~ SOLN
0.0000 [IU] | Freq: Three times a day (TID) | SUBCUTANEOUS | Status: DC
  Administered 2019-02-18: 2 [IU] via SUBCUTANEOUS
  Administered 2019-02-19 – 2019-02-20 (×2): 3 [IU] via SUBCUTANEOUS
  Administered 2019-02-20 – 2019-02-21 (×3): 2 [IU] via SUBCUTANEOUS

## 2019-02-18 MED ORDER — FENTANYL CITRATE (PF) 100 MCG/2ML IJ SOLN: 25.0000 ug | INTRAMUSCULAR | Status: DC | PRN

## 2019-02-18 MED ORDER — BISACODYL 10 MG RE SUPP
10.0000 mg | Freq: Every day | RECTAL | Status: DC
  Administered 2019-02-22: 10 mg via RECTAL
  Filled 2019-02-18: qty 1

## 2019-02-18 MED ORDER — ACETAMINOPHEN 650 MG RE SUPP: 650.0000 mg | Freq: Once | RECTAL | Status: DC

## 2019-02-18 MED ORDER — TAB-A-VITE/IRON PO TABS
1.0000 | ORAL_TABLET | Freq: Every day | ORAL | Status: DC
  Administered 2019-02-19 – 2019-02-22 (×4): 1 via ORAL
  Filled 2019-02-18 (×5): qty 1

## 2019-02-18 MED ORDER — HEMOSTATIC AGENTS (NO CHARGE) OPTIME
TOPICAL | Status: DC | PRN
  Administered 2019-02-18: 1 via TOPICAL

## 2019-02-18 MED ORDER — ROCURONIUM BROMIDE 10 MG/ML (PF) SYRINGE
PREFILLED_SYRINGE | INTRAVENOUS | Status: DC | PRN
  Administered 2019-02-18: 70 mg via INTRAVENOUS

## 2019-02-18 MED ORDER — SODIUM CHLORIDE 0.9% FLUSH
3.0000 mL | Freq: Two times a day (BID) | INTRAVENOUS | Status: DC
  Administered 2019-02-19 – 2019-02-22 (×6): 3 mL via INTRAVENOUS

## 2019-02-18 MED ORDER — CHLORHEXIDINE GLUCONATE 0.12 % MT SOLN
15.0000 mL | OROMUCOSAL | Status: AC
  Administered 2019-02-18: 15 mL via OROMUCOSAL

## 2019-02-18 MED ORDER — SODIUM CHLORIDE 0.9 % IV SOLN
1.5000 g | Freq: Two times a day (BID) | INTRAVENOUS | Status: AC
  Administered 2019-02-18 – 2019-02-20 (×4): 1.5 g via INTRAVENOUS
  Filled 2019-02-18 (×4): qty 1.5

## 2019-02-18 MED ORDER — SUGAMMADEX SODIUM 200 MG/2ML IV SOLN
INTRAVENOUS | Status: DC | PRN
  Administered 2019-02-18: 200 mg via INTRAVENOUS

## 2019-02-18 SURGICAL SUPPLY — 72 items
ADH SKN CLS APL DERMABOND .7 (GAUZE/BANDAGES/DRESSINGS)
AGENT HMST KT MTR STRL THRMB (HEMOSTASIS) ×1
APPLIER CLIP ROT 10 11.4 M/L (STAPLE)
APR CLP MED LRG 11.4X10 (STAPLE)
BLADE CLIPPER SURG (BLADE) ×3 IMPLANT
BLADE STERNUM SYSTEM 6 (BLADE) ×3 IMPLANT
BLADE SURG 11 STRL SS (BLADE) IMPLANT
CANISTER SUCT 3000ML PPV (MISCELLANEOUS) ×3 IMPLANT
CATH THORACIC 28FR (CATHETERS) IMPLANT
CATH THORACIC 28FR RT ANG (CATHETERS) ×6 IMPLANT
CLIP APPLIE ROT 10 11.4 M/L (STAPLE) IMPLANT
CLIP VESOCCLUDE SM WIDE 24/CT (CLIP) ×3 IMPLANT
CLOSURE WOUND 1/2 X4 (GAUZE/BANDAGES/DRESSINGS)
CONN ST 1/4X3/8  BEN (MISCELLANEOUS) ×2
CONN ST 1/4X3/8 BEN (MISCELLANEOUS) ×1 IMPLANT
CONN Y 3/8X3/8X3/8  BEN (MISCELLANEOUS) ×2
CONN Y 3/8X3/8X3/8 BEN (MISCELLANEOUS) ×1 IMPLANT
CONT SPEC 4OZ CLIKSEAL STRL BL (MISCELLANEOUS) ×6 IMPLANT
COVER SURGICAL LIGHT HANDLE (MISCELLANEOUS) IMPLANT
COVER WAND RF STERILE (DRAPES) IMPLANT
DERMABOND ADVANCED (GAUZE/BANDAGES/DRESSINGS)
DERMABOND ADVANCED .7 DNX12 (GAUZE/BANDAGES/DRESSINGS) IMPLANT
DRAIN HEMOVAC 1/8 X 5 (WOUND CARE) IMPLANT
DRAPE CARDIOVASCULAR INCISE (DRAPES) ×3
DRAPE HALF SHEET 40X57 (DRAPES) ×3 IMPLANT
DRAPE LAPAROSCOPIC ABDOMINAL (DRAPES) ×3 IMPLANT
DRAPE SRG 135X102X78XABS (DRAPES) ×1 IMPLANT
DRSG AQUACEL AG ADV 3.5X14 (GAUZE/BANDAGES/DRESSINGS) ×3 IMPLANT
ELECT BLADE 4.0 EZ CLEAN MEGAD (MISCELLANEOUS) ×3
ELECT REM PT RETURN 9FT ADLT (ELECTROSURGICAL) ×3
ELECTRODE BLDE 4.0 EZ CLN MEGD (MISCELLANEOUS) ×1 IMPLANT
ELECTRODE REM PT RTRN 9FT ADLT (ELECTROSURGICAL) ×1 IMPLANT
EVACUATOR SILICONE 100CC (DRAIN) IMPLANT
GAUZE SPONGE 4X4 12PLY STRL (GAUZE/BANDAGES/DRESSINGS) ×3 IMPLANT
GAUZE SPONGE 4X4 12PLY STRL LF (GAUZE/BANDAGES/DRESSINGS) ×3 IMPLANT
GEL ULTRASOUND 20GR AQUASONIC (MISCELLANEOUS) ×3 IMPLANT
GLOVE BIO SURGEON STRL SZ 6 (GLOVE) ×6 IMPLANT
GLOVE BIO SURGEON STRL SZ 6.5 (GLOVE) ×8 IMPLANT
GLOVE BIO SURGEON STRL SZ7.5 (GLOVE) ×6 IMPLANT
GLOVE BIO SURGEONS STRL SZ 6.5 (GLOVE) ×4
GLOVE BIOGEL PI IND STRL 7.0 (GLOVE) ×2 IMPLANT
GLOVE BIOGEL PI INDICATOR 7.0 (GLOVE) ×4
GOWN STRL REUS W/ TWL LRG LVL3 (GOWN DISPOSABLE) ×5 IMPLANT
GOWN STRL REUS W/TWL LRG LVL3 (GOWN DISPOSABLE) ×15
HEMOSTAT POWDER SURGIFOAM 1G (HEMOSTASIS) IMPLANT
KIT BASIN OR (CUSTOM PROCEDURE TRAY) ×3 IMPLANT
KIT SUCTION CATH 14FR (SUCTIONS) IMPLANT
KIT TURNOVER KIT B (KITS) ×3 IMPLANT
NS IRRIG 1000ML POUR BTL (IV SOLUTION) ×6 IMPLANT
PACK CHEST (CUSTOM PROCEDURE TRAY) ×3 IMPLANT
PAD ARMBOARD 7.5X6 YLW CONV (MISCELLANEOUS) ×6 IMPLANT
STAPLER VISISTAT 35W (STAPLE) IMPLANT
STRIP CLOSURE SKIN 1/2X4 (GAUZE/BANDAGES/DRESSINGS) IMPLANT
SURGIFLO W/THROMBIN 8M KIT (HEMOSTASIS) ×3 IMPLANT
SUT PROLENE 4 0 RB 1 (SUTURE)
SUT PROLENE 4-0 RB1 .5 CRCL 36 (SUTURE) IMPLANT
SUT SILK 0 FSL (SUTURE) ×6 IMPLANT
SUT SILK 2 0 SH CR/8 (SUTURE) ×3 IMPLANT
SUT STEEL 6MS V (SUTURE) ×6 IMPLANT
SUT VIC AB 1 CTX 18 (SUTURE) IMPLANT
SUT VIC AB 1 CTX 27 (SUTURE) IMPLANT
SUT VIC AB 2-0 CTX 36 (SUTURE) ×6 IMPLANT
SUT VIC AB 3-0 MH 27 (SUTURE) IMPLANT
SUT VIC AB 3-0 X1 27 (SUTURE) IMPLANT
SWABSTK COMLB BENZOIN TINCTURE (MISCELLANEOUS) IMPLANT
SYSTEM SAHARA CHEST DRAIN ATS (WOUND CARE) ×3 IMPLANT
TAPE CLOTH SURG 4X10 WHT LF (GAUZE/BANDAGES/DRESSINGS) ×3 IMPLANT
TAPE UMBILICAL COTTON 1/8X30 (MISCELLANEOUS) IMPLANT
TOWEL GREEN STERILE (TOWEL DISPOSABLE) ×3 IMPLANT
TOWEL GREEN STERILE FF (TOWEL DISPOSABLE) ×3 IMPLANT
TRAY FOLEY SLVR 14FR TEMP STAT (SET/KITS/TRAYS/PACK) ×3 IMPLANT
WATER STERILE IRR 1000ML POUR (IV SOLUTION) ×3 IMPLANT

## 2019-02-18 NOTE — Progress Notes (Signed)
Patient was placed on BIPAP in the PACU and transported to [facility] without any complicatins. Report was given to [place] RT.

## 2019-02-18 NOTE — Anesthesia Procedure Notes (Signed)
Central Venous Catheter Insertion Performed by: Oleta Mouse, MD, anesthesiologist Start/End11/19/2020 7:06 AM, 02/18/2019 7:12 AM Patient location: Pre-op. Preanesthetic checklist: patient identified, IV checked, site marked, risks and benefits discussed, surgical consent, monitors and equipment checked, pre-op evaluation, timeout performed and anesthesia consent Lidocaine 1% used for infiltration and patient sedated Hand hygiene performed  and maximum sterile barriers used  Catheter size: 8 Fr Total catheter length 16. Central line was placed.Double lumen Procedure performed using ultrasound guided technique. Ultrasound Notes:anatomy identified, needle tip was noted to be adjacent to the nerve/plexus identified, no ultrasound evidence of intravascular and/or intraneural injection and image(s) printed for medical record Attempts: 1 Following insertion, dressing applied, line sutured and Biopatch. Post procedure assessment: blood return through all ports, free fluid flow and no air  Patient tolerated the procedure well with no immediate complications.

## 2019-02-18 NOTE — Anesthesia Procedure Notes (Signed)
Arterial Line Insertion Start/End11/19/2020 7:00 AM, 02/18/2019 7:09 AM Performed by: Harden Mo, CRNA, CRNA  Preanesthetic checklist: patient identified, IV checked, site marked, risks and benefits discussed, surgical consent, monitors and equipment checked, pre-op evaluation and anesthesia consent Lidocaine 1% used for infiltration and patient sedated Left, radial was placed Catheter size: 20 G Hand hygiene performed  and maximum sterile barriers used  Allen's test indicative of satisfactory collateral circulation Attempts: 1 Procedure performed without using ultrasound guided technique. Ultrasound Notes:anatomy identified, needle tip was noted to be adjacent to the nerve/plexus identified and no ultrasound evidence of intravascular and/or intraneural injection Following insertion, dressing applied and Biopatch. Post procedure assessment: normal  Patient tolerated the procedure well with no immediate complications.

## 2019-02-18 NOTE — Anesthesia Procedure Notes (Signed)
Procedure Name: Intubation Date/Time: 02/18/2019 7:58 AM Performed by: Harden Mo, CRNA Pre-anesthesia Checklist: Patient identified, Emergency Drugs available, Suction available and Patient being monitored Patient Re-evaluated:Patient Re-evaluated prior to induction Oxygen Delivery Method: Circle System Utilized Preoxygenation: Pre-oxygenation with 100% oxygen Induction Type: IV induction Ventilation: Mask ventilation without difficulty and Oral airway inserted - appropriate to patient size Laryngoscope Size: Sabra Heck and 2 Grade View: Grade I Tube type: Oral Tube size: 8.0 mm Number of attempts: 1 Airway Equipment and Method: Stylet and Oral airway Placement Confirmation: ETT inserted through vocal cords under direct vision,  positive ETCO2 and breath sounds checked- equal and bilateral Secured at: 22 cm Tube secured with: Tape Dental Injury: Teeth and Oropharynx as per pre-operative assessment

## 2019-02-18 NOTE — H&P (Signed)
PCP is Abran Richard, MD Referring Provider is No ref. provider found  No chief complaint on file.   HPI: Scheduled office visit with follow-up.  Patient has a 3 x 5 cm anterior mediastinal mass which is been slowly increasing in size.  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  She has no symptoms of myasthenia.  She has had history o smoking with COPD and history of heavy alcohol use.  Both agents have been used sparingly over the past several weeks.  She is smoking less than 1/2 pack/day.  She is drinking 2 beers a day and no hard liquor.  I walked the patient up and down the hallway about 150 feet and she showed no signs of tachycardia shortness of breath or desaturation.  Patient would benefit from partial sternotomy for resection of the anterior mediastinal mass if her PFTs are adequate for thoracic surgery.  She will be scheduled for surgery on November 16 with preop Covid testing and PFTs. I have prescribed a course of oral azithromycin for mild of productive cough.  She had a chest x-ray performed today which shows no evidence of pneumonia pleural effusion or airspace disease. The anterior mediastinal density remains. Past Medical History:  Diagnosis Date  . Arthritis   . COPD (chronic obstructive pulmonary disease) ([place])   . Depression   . Dyspnea   . Essential hypertension, benign   . Type 2 diabetes mellitus ([place])   . Urinary incontinence     Past Surgical History:  Procedure Laterality Date  . CHOLECYSTECTOMY    . FRACTURE SURGERY Left    arm  . OPEN REDUCTION INTERNAL FIXATION (ORIF) PROXIMAL PHALANX Left 04/11/2017   Procedure: OPEN REDUCTION INTERNAL FIXATION (ORIF) PROXIMAL PHALANX;  Surgeon: Charlotte Crumb, MD;  Location: [facility];  Service: Orthopedics;  Laterality: Left;    Family History  Problem Relation Age of Onset  . Diabetes Mellitus II Sister   . Diabetes Mellitus II Mother   . Hypertension Brother   . Hypertension Sister      Social History Social History   Tobacco Use  . Smoking status: Current Every Day Smoker    Packs/day: 0.50    Years: 43.00    Pack years: 21.50    Types: Cigarettes    Start date: 01/09/1971  . Smokeless tobacco: Never Used  Substance Use Topics  . Alcohol use: Yes    Alcohol/week: 0.0 standard drinks    Comment: "a fifth of liquor in less than a week but now I am only drinking about 2 beers a day"  . Drug use: No    Current Facility-Administered Medications  Medication Dose Route Frequency Provider Last Rate Last Dose  . 0.9 % irrigation (POUR BTL)    PRN Prescott Gum, Collier Salina, MD   2,000 mL at 02/18/19 0701  . ceFAZolin (ANCEF) IVPB 2g/100 mL premix  2 g Intravenous To SS-Surg Ivin Poot, MD      . hemostatic agents    PRN Prescott Gum, Collier Salina, MD   1 application at [id number]  . lactated ringers infusion   Intravenous Continuous Oleta Mouse, MD      . vancomycin (VANCOCIN) 1,000 mg in sodium chloride 0.9 % 1,000 mL irrigation   Irrigation Once PRN Ivin Poot, MD       Facility-Administered Medications Ordered in Other Encounters  Medication Dose Route Frequency Provider Last Rate Last Dose  . fentaNYL (SUBLIMAZE) injection   Intravenous Anesthesia Intra-op Harden Mo,  CRNA   25 mcg at 02/18/19 0650  . lactated ringers infusion    Continuous PRN Harden Mo, CRNA      . lactated ringers infusion    Continuous PRN Harden Mo, CRNA      . midazolam (VERSED) 5 MG/5ML injection    Anesthesia Intra-op Harden Mo, CRNA   1 mg at 02/18/19 [id number]    No Known Allergies  Review of Systems   Since last visit the patient had her dental extractions and has done well.  She is starting to regain weight. Mild shortness of breath with exertion No fever No chest pain  BP (!) 191/75   Pulse 64   Temp 98.3 F (36.8 C)   Ht 5\' 9"  (1.753 m)   Wt 67.7 kg   SpO2 99%   BMI 22.05 kg/m  Physical Exam      Exam    General- alert and comfortable, appears  chronically ill    Neck- no JVD, no cervical adenopathy palpable, no carotid bruit   Lungs- clear without rales, wheezes   Cor- regular rate and rhythm, no murmur , gallop   Abdomen- soft, non-tender   Extremities - warm, non-tender, minimal edema   Neuro- oriented, appropriate, no focal weakness   Diagnostic Tests: Chest x-ray image today personally reviewed showing no infiltrates Patient had a Cardiolite scan 2016 showing normal EF 65%, no evidence of ischemia.  Impression: Slowly growing anterior mediastinal mass Patient's pulmonary status appears to be stable with PFTs pending.  Plan: We will tentatively schedule patient for surgery on Monday, November 16 for partial sternotomy and resection of anterior mediastinal mass.  I discussed the procedure, expected recovery, potential risks which are mainly pulmonary for this patient.   Len Childs, MD [facility] [phone]

## 2019-02-18 NOTE — Op Note (Signed)
NAME: Holly May, Holly May MEDICAL RECORD [id number] ACCOUNT [id number] DATE OF BIRTH:May 25, 1950 FACILITY: [facility] LOCATION: [facility] PHYSICIAN:PETER VAN TRIGT III, MD  OPERATIVE REPORT  DATE OF PROCEDURE:  02/18/2019  OPERATION: 1.  Median sternotomy. 2.  Resection of 6 cm anterior mediastinal mass -- thymoma.  SURGEON:  Ivin Poot, MD  ASSISTANT:  Lars Pinks, PA-C.  ANESTHESIA:  General by Dr. Laurie Panda.  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  DESCRIPTION OF PROCEDURE:  After informed consent was documented in the preop holding area and final issues addressed, the patient was brought directly to the operating room and placed supine on the operating table.  The patient previously had been seen  in consultation on several occasions in the office to discuss this operation including indications, benefits, alternatives, expected postoperative recovery, and risks including risks of bleeding, stroke, infection, pulmonary problems including ventilator  dependence due to her significant COPD, and death.  She demonstrated her understanding and had agreed to proceed with surgery under what I felt was an informed consent.  The patient remained stable during intubation.  The chest and upper abdomen was prepped and draped as a sterile field.  A proper time-out was performed.  A sternal incision was made followed by sternotomy.  The sternum was retracted.  There is an  anterior mediastinal fleshy mass extending from the innominate vein to the lower part of the pericardium.  It infiltrated into the left pleural membrane, the right pleural membrane and into the innominate vein superiorly.  Using careful dissection, the mass was removed in toto/en bloc.  The involvement along the left phrenic nerve was carefully dissected avoiding electrocautery using sharp dissection  and the nerve was preserved.  The venous branches from the innominate vein were doubly ligated and divided.  The tumor was attached to the pericardium, but did not invade the pericardium.  It was dissected off the surface of the pericardium.  The tumor was removed en bloc.   Hemostasis was achieved.  Bilateral pleural spaces have been open so bilateral pleural tubes were placed as well as an anterior mediastinal drainage tube and brought out through separate incisions.  These were secured to the skin.  The sternum was reapproximated with 7 interrupted steel wires.  The chest was closed.  The patient remained stable.  The pectoralis fascia was closed with a running #1 Vicryl.  Subcutaneous and skin layers were closed using running Vicryl and sterile  dressings were applied.  The patient was observed and extubated in the operating room by the anesthesia team, then transported to the recovery room for further observation.  TN/NUANCE  D:02/18/2019 T:02/18/2019 JOB:[id number]

## 2019-02-18 NOTE — Progress Notes (Signed)
RT went into pt's room to do 12AM check, pt was not on bipap and was resting comfortably on 4L [place]. RT will continue to monitor as needed and will place back on bipap if increase work of breathing occurs.

## 2019-02-18 NOTE — Progress Notes (Signed)
EVENING ROUNDS NOTE :     [address]       [place] [address]             [phone]                 Day of Surgery Procedure(s) (LRB): STERNOTOMY (N/A) RESECTION OF MEDIASTINAL MASS (N/A)   Total Length of Stay:  LOS: 0 days  Events:  Resting on bipap HTN on nitro gtt Minimal CT output    BP (!) 160/58   Pulse 64   Temp 98.1 F (36.7 C)   Resp 15   Ht 5\' 9"  (1.753 m)   Wt 67.7 kg   SpO2 100%   BMI 22.05 kg/m      FiO2 (%):  [40 %] 40 %  . sodium chloride 20 mL/hr at 02/18/19 1500  . [START ON 02/19/2019] sodium chloride    . sodium chloride    . albumin human    . cefUROXime (ZINACEF)  IV    . famotidine (PEPCID) IV 20 mg (02/18/19 1240)  . lactated ringers    . lactated ringers Stopped (02/18/19 1200)  . nitroGLYCERIN Stopped (02/18/19 1440)  . phenylephrine (NEO-SYNEPHRINE) Adult infusion    . vancomycin      No intake/output data recorded.   CBC Latest Ref Rng & Units 02/18/2019 02/18/2019 02/16/2019  WBC 4.0 - 10.5 K/uL - 15.5(H) 11.2(H)  Hemoglobin 12.0 - 15.0 g/dL 14.3 13.6 13.6  Hematocrit 36.0 - 46.0 % 42.0 40.4 41.4  Platelets 150 - 400 K/uL - 276 276    BMP Latest Ref Rng & Units 02/18/2019 02/18/2019 02/16/2019  Glucose 70 - 99 mg/dL - 195(H) 102(H)  BUN 8 - 23 mg/dL - 12 17  Creatinine 0.44 - 1.00 mg/dL - 0.88 1.31(H)  Sodium 135 - 145 mmol/L 136 135 135  Potassium 3.5 - 5.1 mmol/L 3.8 4.1 3.4(L)  Chloride 98 - 111 mmol/L - 100 100  CO2 22 - 32 mmol/L - 27 24  Calcium 8.9 - 10.3 mg/dL - 8.9 9.2    ABG    Component Value Date/Time   PHART 7.288 (L) 02/18/2019 1337   PCO2ART 57.7 (H) 02/18/2019 1337   PO2ART 113.0 (H) 02/18/2019 1337   HCO3 27.7 02/18/2019 1337   TCO2 29 02/18/2019 1337   O2SAT 98.0 02/18/2019 [place], MD 02/18/2019 5:09 PM

## 2019-02-18 NOTE — Transfer of Care (Signed)
Immediate Anesthesia Transfer of Care Note  Patient: Holly May  Procedure(s) Performed: STERNOTOMY (N/A Chest) RESECTION OF MEDIASTINAL MASS (N/A Chest)  Patient Location: PACU  Anesthesia Type:General  Level of Consciousness: awake, alert  and oriented  Airway & Oxygen Therapy: Patient Spontanous Breathing and Patient connected to face mask oxygen  Post-op Assessment: Report given to RN, Post -op Vital signs reviewed and stable and Patient moving all extremities X 4  Post vital signs: Reviewed and stable  Last Vitals:  Vitals Value Taken Time  BP 166/78 02/18/19 1027  Temp    Pulse 66 02/18/19 1032  Resp 16 02/18/19 1032  SpO2 100 % 02/18/19 1032  Vitals shown include unvalidated device data.  Last Pain:  Vitals:   02/18/19 0617  PainSc: 0-No pain         Complications: No apparent anesthesia complications

## 2019-02-18 NOTE — Brief Op Note (Addendum)
02/18/2019  9:44 AM  PATIENT:  Holly May  68 y.o. 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   Delay start of Pharmacological VTE agent (>24hrs) due to surgical blood loss or risk of bleeding: yes

## 2019-02-18 NOTE — Progress Notes (Signed)
Pre Procedure note for inpatients:   Holly May has been scheduled for Procedure(s): STERNOTOMY (N/A) RESECTION OF MEDIASTINAL MASS (N/A) today. The various methods of treatment have been discussed with the patient. After consideration of the risks, benefits and treatment options the patient has consented to the planned procedure.   The patient has been seen and labs reviewed. There are no changes in the patient's condition to prevent proceeding with the planned procedure today.  Recent labs:  Lab Results  Component Value Date   WBC 11.2 (H) 02/16/2019   HGB 13.6 02/16/2019   HCT 41.4 02/16/2019   PLT 276 02/16/2019   GLUCOSE 102 (H) 02/16/2019   ALT 11 02/16/2019   AST 14 (L) 02/16/2019   NA 135 02/16/2019   K 3.4 (L) 02/16/2019   CL 100 02/16/2019   CREATININE 1.31 (H) 02/16/2019   BUN 17 02/16/2019   CO2 24 02/16/2019   TSH 0.484 03/31/2013   INR 0.9 02/16/2019   HGBA1C 6.1 (H) 02/16/2019    Len Childs, MD 02/18/2019 7:39 AM

## 2019-02-19 ENCOUNTER — Encounter (HOSPITAL_COMMUNITY): Payer: Self-pay | Admitting: Cardiothoracic Surgery

## 2019-02-19 ENCOUNTER — Inpatient Hospital Stay (HOSPITAL_COMMUNITY): Payer: Medicare Other

## 2019-02-19 LAB — POCT I-STAT 7, (LYTES, BLD GAS, ICA,H+H)
Acid-Base Excess: 1 mmol/L (ref 0.0–2.0)
Acid-Base Excess: 3 mmol/L — ABNORMAL HIGH (ref 0.0–2.0)
Bicarbonate: 26.6 mmol/L (ref 20.0–28.0)
Bicarbonate: 28 mmol/L (ref 20.0–28.0)
Calcium, Ion: 1.14 mmol/L — ABNORMAL LOW (ref 1.15–1.40)
Calcium, Ion: 1.15 mmol/L (ref 1.15–1.40)
HCT: 34 % — ABNORMAL LOW (ref 36.0–46.0)
HCT: 35 % — ABNORMAL LOW (ref 36.0–46.0)
Hemoglobin: 11.6 g/dL — ABNORMAL LOW (ref 12.0–15.0)
Hemoglobin: 11.9 g/dL — ABNORMAL LOW (ref 12.0–15.0)
O2 Saturation: 96 %
O2 Saturation: 97 %
Patient temperature: 37.2
Patient temperature: 98.4
Potassium: 3.7 mmol/L (ref 3.5–5.1)
Potassium: 3.7 mmol/L (ref 3.5–5.1)
Sodium: 134 mmol/L — ABNORMAL LOW (ref 135–145)
Sodium: 134 mmol/L — ABNORMAL LOW (ref 135–145)
TCO2: 28 mmol/L (ref 22–32)
TCO2: 29 mmol/L (ref 22–32)
pCO2 arterial: 42.9 mmHg (ref 32.0–48.0)
pCO2 arterial: 44.7 mmHg (ref 32.0–48.0)
pH, Arterial: 7.4 (ref 7.350–7.450)
pH, Arterial: 7.405 (ref 7.350–7.450)
pO2, Arterial: 84 mmHg (ref 83.0–108.0)
pO2, Arterial: 89 mmHg (ref 83.0–108.0)

## 2019-02-19 LAB — BASIC METABOLIC PANEL
Anion gap: 11 (ref 5–15)
Anion gap: 10 (ref 5–15)
BUN: 19 mg/dL (ref 8–23)
BUN: 16 mg/dL (ref 8–23)
CO2: 26 mmol/L (ref 22–32)
CO2: 30 mmol/L (ref 22–32)
Calcium: 8.6 mg/dL — ABNORMAL LOW (ref 8.9–10.3)
Calcium: 9.2 mg/dL (ref 8.9–10.3)
Chloride: 97 mmol/L — ABNORMAL LOW (ref 98–111)
Chloride: 95 mmol/L — ABNORMAL LOW (ref 98–111)
Creatinine, Ser: 1.1 mg/dL — ABNORMAL HIGH (ref 0.44–1.00)
Creatinine, Ser: 0.88 mg/dL (ref 0.44–1.00)
GFR calc Af Amer: >60 mL/min (ref >60)
GFR calc Af Amer: >60 mL/min (ref >60)
GFR calc non Af Amer: 52 mL/min — ABNORMAL LOW (ref >60)
GFR calc non Af Amer: >60 mL/min (ref >60)
Glucose, Bld: 116 mg/dL — ABNORMAL HIGH (ref 70–99)
Glucose, Bld: 86 mg/dL (ref 70–99)
Potassium: 3.9 mmol/L (ref 3.5–5.1)
Potassium: 3.4 mmol/L — ABNORMAL LOW (ref 3.5–5.1)
Sodium: 134 mmol/L — ABNORMAL LOW (ref 135–145)
Sodium: 135 mmol/L (ref 135–145)

## 2019-02-19 LAB — CBC
HCT: 34 % — ABNORMAL LOW (ref 36.0–46.0)
HCT: 37 % (ref 36.0–46.0)
Hemoglobin: 11.1 g/dL — ABNORMAL LOW (ref 12.0–15.0)
Hemoglobin: 12.6 g/dL (ref 12.0–15.0)
MCH: 32.5 pg (ref 26.0–34.0)
MCH: 33.3 pg (ref 26.0–34.0)
MCHC: 32.6 g/dL (ref 30.0–36.0)
MCHC: 34.1 g/dL (ref 30.0–36.0)
MCV: 99.4 fL (ref 80.0–100.0)
MCV: 97.9 fL (ref 80.0–100.0)
Platelets: 250 10*3/uL (ref 150–400)
Platelets: 255 10*3/uL (ref 150–400)
RBC: 3.42 MIL/uL — ABNORMAL LOW (ref 3.87–5.11)
RBC: 3.78 MIL/uL — ABNORMAL LOW (ref 3.87–5.11)
RDW: 14 % (ref 11.5–15.5)
RDW: 13.8 % (ref 11.5–15.5)
WBC: 13.3 10*3/uL — ABNORMAL HIGH (ref 4.0–10.5)
WBC: 13.6 10*3/uL — ABNORMAL HIGH (ref 4.0–10.5)
nRBC: 0 % (ref 0.0–0.2)
nRBC: 0 % (ref 0.0–0.2)

## 2019-02-19 LAB — GLUCOSE, CAPILLARY
Glucose-Capillary: 116 mg/dL — ABNORMAL HIGH (ref 70–99)
Glucose-Capillary: 185 mg/dL — ABNORMAL HIGH (ref 70–99)
Glucose-Capillary: 114 mg/dL — ABNORMAL HIGH (ref 70–99)
Glucose-Capillary: 146 mg/dL — ABNORMAL HIGH (ref 70–99)
Glucose-Capillary: 137 mg/dL — ABNORMAL HIGH (ref 70–99)

## 2019-02-19 LAB — MAGNESIUM
Magnesium: 1.6 mg/dL — ABNORMAL LOW (ref 1.7–2.4)
Magnesium: 2 mg/dL (ref 1.7–2.4)

## 2019-02-19 LAB — TROPONIN I (HIGH SENSITIVITY): Troponin I (High Sensitivity): 3 ng/L (ref <18)

## 2019-02-19 MED ORDER — FENTANYL CITRATE (PF) 100 MCG/2ML IJ SOLN: 50.0000 ug | INTRAMUSCULAR | Status: DC | PRN

## 2019-02-19 MED ORDER — POTASSIUM CHLORIDE CRYS ER 20 MEQ PO TBCR
20.0000 meq | EXTENDED_RELEASE_TABLET | ORAL | Status: AC
  Administered 2019-02-19 – 2019-02-20 (×3): 20 meq via ORAL
  Filled 2019-02-19 (×3): qty 1

## 2019-02-19 MED ORDER — KETOROLAC TROMETHAMINE 15 MG/ML IJ SOLN
15.0000 mg | Freq: Four times a day (QID) | INTRAMUSCULAR | Status: AC
  Administered 2019-02-19 – 2019-02-20 (×4): 15 mg via INTRAVENOUS
  Filled 2019-02-19 (×4): qty 1

## 2019-02-19 MED ORDER — MIDAZOLAM HCL 2 MG/2ML IJ SOLN: 2.0000 mg | INTRAMUSCULAR | Status: DC | PRN

## 2019-02-19 MED ORDER — FUROSEMIDE 10 MG/ML IJ SOLN
20.0000 mg | Freq: Every day | INTRAMUSCULAR | Status: AC
  Administered 2019-02-19 – 2019-02-20 (×2): 20 mg via INTRAVENOUS
  Filled 2019-02-19 (×2): qty 2

## 2019-02-19 MED ORDER — GABAPENTIN 300 MG PO CAPS
600.0000 mg | ORAL_CAPSULE | Freq: Two times a day (BID) | ORAL | Status: DC
  Administered 2019-02-19 – 2019-02-22 (×7): 600 mg via ORAL
  Filled 2019-02-19 (×5): qty 2
  Filled 2019-02-19: qty 6
  Filled 2019-02-19: qty 2

## 2019-02-19 NOTE — Discharge Instructions (Signed)
Discharge Instructions:  1. You may shower, please wash incisions daily with soap and water and keep dry.  If you wish to cover wounds with dressing you may do so but please keep clean and change daily.  No tub baths or swimming until incisions have completely healed.  If your incisions become red or develop any drainage please call our office at [phone]  2. No Driving until cleared by Lucianne Lei Trigt's office and you are no longer using narcotic pain medications  3. Monitor your weight daily.. Please use the same scale and weigh at same time... If you gain 5-10 lbs in 48 hours with associated lower extremity swelling, please contact our office at [phone]  4. Fever of 101.5 for at least 24 hours with no source, please contact our office at [phone]  5. Activity- up as tolerated, please walk at least 3 times per day.  Avoid strenuous activity, no lifting, pushing, or pulling with your arms over 8-10 lbs for a minimum of 6 weeks  6. If any questions or concerns arise, please do not hesitate to contact our office at [phone]

## 2019-02-19 NOTE — Discharge Summary (Addendum)
Physician Discharge Summary       [address]       [place] [address]             [phone]    Patient ID: Holly May MRN: [id number] DOB/AGE: [birth year] 68 y.o.  Admit date: 02/18/2019 Discharge date: 02/22/2019  Admission Diagnoses: Mediastinal mass  Discharge Diagnoses:  1. S/p resection of mediastinal mass 2. History of COPD (chronic obstructive pulmonary disease) ([place]) 3. History of Essential hypertension, benign 4. History of Type 2 diabetes mellitus ([place]) 5. History of Urinary incontinence 6. History of depression 7. History of arthritis 8. History of tobacco abuse 9. History of alcohol abuse  Consults: None   Procedure (s):  1.  Median sternotomy. 2.  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 Prescott Gum on 02/18/2019.  Pathology: Report not posted in Epic at time of discharge  History of Presenting Illness: Patient has a 3 x 5 cm anterior mediastinal mass which is been slowly increasing in size.  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  She has no symptoms of myasthenia.  She has had history o smoking with COPD and history of heavy alcohol use.  Both agents have been used sparingly over the past several weeks.  She is smoking less than 1/2 pack/day.  She is drinking 2 beers a day and no hard liquor.  I walked the patient up and down the hallway about 150 feet and she showed no signs of tachycardia shortness of breath or desaturation.  Patient would benefit from partial sternotomy for resection of the anterior mediastinal mass if her PFTs are adequate for thoracic surgery.  She will be scheduled for surgery on November 16 with preop Covid testing and PFTs. I have prescribed a course of oral azithromycin for mild of productive cough.  She had a chest x-ray performed today which shows no evidence of pneumonia pleural effusion or airspace disease. The anterior mediastinal density remains. We will tentatively schedule  patient for surgery on Monday, Dr. Prescott Gum discussed the procedure, expected recovery, potential risks which are mainly pulmonary for this patient. She was admitted on 02/18/2019 in order to undergo a median sternotomy for resection of anterior mediastinal mass.   Brief Hospital Course:  The patient remained afebrile and hemodynamically stable. She was hypertensive so was started on IV Nitro. A line and foley were removed early in the post operative course. Chest tube output gradually decreased and there was no air leak. Daily chest x rays were obtained and remained stable. All chest tubes were removed on 11/21. She was put on thiamine for etoh abuse history and a nicotine patch for tobacco abuse. Central line was removed on 11/22. Patient is ambulating on room air. Patient is tolerating a diet and has had a bowel movement. Wounds are clean and dry. Final chest X ray showed trace bilateral pleural effusions and mild bibasilar atelectasis. There was no pneumothorax. Patient is felt surgically stable for discharge today.   Latest Vital Signs: Blood pressure (!) 156/73, pulse 89, temperature 98.3 F (36.8 C), temperature source Oral, resp. rate 16, height 5\' 9"  (1.753 m), weight 67.7 kg, SpO2 92 %.  Physical Exam:  General appearance: alert, cooperative and no distress Neurologic: intact Heart: RRR. monitor shows NSR, no arrhythimas.  Lungs:  Breath sounds CTA, CXR shows clear lung fields, trce bilateral effusions and mild ATX.  Abdomen: soft and non-tender.  Extremities: no edema, all well perfused.  Wound:  the sternotomy incision and CT sites are dry and intact.   Discharge Condition: Stable and discharged to home.  Recent laboratory studies:  Lab Results  Component Value Date   WBC 12.1 (H) 02/21/2019   HGB 12.5 02/21/2019   HCT 37.9 02/21/2019   MCV 99.2 02/21/2019   PLT 258 02/21/2019   Lab Results  Component Value Date   NA 135 02/21/2019   K 3.8 02/21/2019   CL 96 (L)  02/21/2019   CO2 31 02/21/2019   CREATININE 0.81 02/21/2019   GLUCOSE 103 (H) 02/21/2019      Diagnostic Studies: Dg Chest 2 View  Result Date: 02/22/2019 CLINICAL DATA:  Postop check EXAM: CHEST - 2 VIEW COMPARISON:  02/21/2019 FINDINGS: Mild bibasilar opacities, likely atelectasis, with associated trace bilateral pleural effusions. No pneumothorax is seen. The heart is normal in size. Thoracic aortic atherosclerosis. Median sternotomy. Mild degenerative changes of the visualized thoracolumbar spine. IMPRESSION: Mild bibasilar atelectasis with trace bilateral pleural effusions. No pneumothorax is seen. Electronically Signed   By: Julian Hy M.D.   On: 02/22/2019 07:49   Dg Chest 2 View  Result Date: 02/16/2019 CLINICAL DATA:  Preop for sternotomy EXAM: CHEST - 2 VIEW COMPARISON:  February 10, 2019 FINDINGS: There is a new linear airspace opacity at the right lung base. No pneumothorax. No large pleural effusion. There are old healed rib fractures. Aortic calcifications are noted. There is no acute osseous abnormality. The patient's known anterior mediastinal mass is better visualized on prior CT. The lungs are hyperexpanded. IMPRESSION: 1. New linear airspace opacity at the right lung base may represent atelectasis or pneumonia. 2. Hyperexpanded lungs similar to prior study. 3. The known anterior mediastinal mass is better visualized on prior CT. Electronically Signed   By: Constance Holster M.D.   On: 02/16/2019 19:57   Dg Chest 2 View  Result Date: 02/10/2019 CLINICAL DATA:  History of anterior mediastinal mass on multiple prior chest CT studies. Dyspnea. EXAM: CHEST - 2 VIEW COMPARISON:  01/20/2019 chest CT.  02/17/2018 chest radiograph. FINDINGS: Stable cardiomediastinal silhouette with normal heart size and slight prominence of the ascending aortic contour. Minimal upper retrosternal opacity on the lateral view correlates with known anterior mediastinal mass. No pneumothorax. No  pleural effusion. Mildly hyperinflated lungs. No pulmonary edema. No acute consolidative airspace disease. IMPRESSION: 1. Mildly hyperinflated lungs, suggesting obstructive lung disease. 2. Otherwise no active cardiopulmonary disease. 3. Minimal upper retrosternal opacity on the lateral view correlates with known anterior mediastinal mass. Electronically Signed   By: Ilona Sorrel M.D.   On: 02/10/2019 09:36   Dg Chest Port 1 View In Am  Result Date: 02/21/2019 CLINICAL DATA:  Chest tube removal. EXAM: PORTABLE CHEST 1 VIEW COMPARISON:  02/20/2019 FINDINGS: 0447 hours. Lungs are hyperexpanded. Bilateral chest tubes of been removed in the interval with no evidence for residual pneumothorax. Right IJ central line tip overlies the distal SVC. There is some minimal atelectasis at the bases. No pulmonary edema or substantial pleural effusion. Cardiopericardial silhouette is at upper limits of normal for size. Bones are diffusely demineralized. IMPRESSION: 1. No evidence for residual pneumothorax status post bilateral chest tube removal. 2. Bibasilar atelectasis. Electronically Signed   By: Misty Stanley M.D.   On: 02/21/2019 09:43   Dg Chest Port 1 View  Result Date: 02/20/2019 CLINICAL DATA:  Chest tube. EXAM: PORTABLE CHEST 1 VIEW COMPARISON:  02/19/2019 FINDINGS: 0600 hours. Lungs are hyperexpanded. Interval improvement in basilar aeration bilaterally. Bilateral chest tubes remain in  place without evidence for residual pneumothorax. No substantial pleural effusion or evidence of focal lung consolidation. Right IJ central line tip overlies the distal SVC. IMPRESSION: Improved bibasilar aeration. No pneumothorax with bilateral chest tubes in situ. Electronically Signed   By: Misty Stanley M.D.   On: 02/20/2019 10:05   Dg Chest Port 1 View  Result Date: 02/19/2019 CLINICAL DATA:  Chest tube. EXAM: PORTABLE CHEST 1 VIEW COMPARISON:  February 18, 2019. FINDINGS: Stable cardiomediastinal silhouette. Bilateral  chest tubes are noted without pneumothorax. Right internal jugular catheter is noted. Atherosclerosis of thoracic aorta is noted. Minimal bibasilar subsegmental atelectasis is noted. Bony thorax is unremarkable. IMPRESSION: Stable position of bilateral chest tubes without pneumothorax. Minimal bibasilar subsegmental atelectasis. Aortic Atherosclerosis (ICD10-I70.0). Electronically Signed   By: Marijo Conception M.D.   On: 02/19/2019 08:27   Dg Chest Port 1 View  Result Date: 02/18/2019 CLINICAL DATA:  Postop sternotomy EXAM: PORTABLE CHEST 1 VIEW COMPARISON:  February 16, 2019 FINDINGS: The heart size and mediastinal contours are within normal limits. Aortic knob calcifications and overlying median sternotomy wires. A right and left-sided chest tube are noted. No definite pneumothorax. A right-sided central venous catheter seen with the tip in the mid SVC. large airspace consolidation or pleural effusion. No acute osseous abnormality. Old healed bilateral rib fractures are noted. IMPRESSION: Bilateral chest tubes.  No definite pneumothorax. No acute cardiopulmonary process. Electronically Signed   By: Prudencio Pair M.D.   On: 02/18/2019 10:48     Discharge Medications: Allergies as of 02/22/2019   No Known Allergies     Medication List    TAKE these medications   albuterol 108 (90 Base) MCG/ACT inhaler Commonly known as: VENTOLIN HFA Inhale 2 puffs into the lungs every 4 (four) hours as needed for wheezing or shortness of breath (cough, shortness of breath or wheezing.).   amLODipine 10 MG tablet Commonly known as: NORVASC Take 10 mg by mouth daily.   azithromycin 250 MG tablet Commonly known as: Zithromax Z-Pak 2 tablets the first day then 1 tablet daily until finished   citalopram 20 MG tablet Commonly known as: CELEXA Take 1 tablet (20 mg total) by mouth daily.   gabapentin 300 MG capsule Commonly known as: NEURONTIN Take 600 mg by mouth at bedtime.   guaiFENesin 600 MG 12 hr  tablet Commonly known as: MUCINEX Take 1 tablet (600 mg total) by mouth 2 (two) times daily.   hydrochlorothiazide 25 MG tablet Commonly known as: HYDRODIURIL Take 25 mg by mouth daily.   lisinopril 40 MG tablet Commonly known as: ZESTRIL Take 40 mg by mouth daily.   lovastatin 40 MG tablet Commonly known as: MEVACOR Take 40 mg by mouth at bedtime.   metFORMIN 1000 MG tablet Commonly known as: GLUCOPHAGE Take 500 mg by mouth daily with breakfast.   metoprolol succinate 25 MG 24 hr tablet Commonly known as: TOPROL-XL Take 25 mg by mouth daily.   oxybutynin 5 MG tablet Commonly known as: DITROPAN Take 5 mg by mouth 3 (three) times daily.   Spiriva HandiHaler 18 MCG inhalation capsule Generic drug: tiotropium Place 1 puff into inhaler and inhale daily.   traMADol 50 MG tablet Commonly known as: ULTRAM Take 1 tablet (50 mg total) by mouth every 4 (four) hours as needed for up to 5 days for moderate pain.   traZODone 150 MG tablet Commonly known as: DESYREL Take 150 mg by mouth at bedtime.   Vitamin D (Ergocalciferol) 1.25 MG (50000 UT) Caps  capsule Commonly known as: DRISDOL Take 50,000 Units by mouth every 7 (seven) days.       Follow Up Appointments: Follow-up Information    Prescott Gum, Collier [place]ina, MD. Go on 03/31/2019.   Specialty: Cardiothoracic Surgery Why: PA/LAT CXR to be taken (at [facility] which is in the same building as Dr. Lucianne Lei Trigt's office on 12/30 at 11:00 pm ;Appointment time is at 11:30 pm Contact information: [place] [address] [phone]           Signed: Joline Maxcy 02/22/2019, 8:32 AM   patient examined and medical record reviewed,agree with above note. Tharon Aquas Trigt III 02/22/2019

## 2019-02-19 NOTE — Progress Notes (Signed)
Patient ID: Holly May, female   DOB: 03/27/1951, 68 y.o.   MRN: [id number] EVENING ROUNDS NOTE :     [address]       ,[place] [address]             [phone]                 1 Day Post-Op Procedure(s) (LRB): STERNOTOMY (N/A) RESECTION OF MEDIASTINAL MASS (N/A)  Total Length of Stay:  LOS: 1 day  BP 121/71   Pulse 70   Temp 98 F (36.7 C) (Oral)   Resp 17   Ht 5\' 9"  (1.753 m)   Wt 71.4 kg   SpO2 96%   BMI 23.25 kg/m   .Intake/Output      11/19 0701 - 11/20 0700 11/20 0701 - 11/21 0700   I.V. (mL/kg) 2017.4 (28.3)    IV Piggyback 359.9    Total Intake(mL/kg) 2377.3 (33.3)    Urine (mL/kg/hr) 1025 (0.6) 2855 (3.7)   Blood 50    Chest Tube 230 90   Total Output 1305 2945   Net +1072.3 -2945          . sodium chloride 20 mL/hr at 02/19/19 0500  . sodium chloride    . sodium chloride    . cefUROXime (ZINACEF)  IV 1.5 g (02/19/19 0856)  . lactated ringers       Lab Results  Component Value Date   WBC 13.6 (H) 02/19/2019   HGB 12.6 02/19/2019   HCT 37.0 02/19/2019   PLT 255 02/19/2019   GLUCOSE 116 (H) 02/19/2019   ALT 11 02/16/2019   AST 14 (L) 02/16/2019   NA 134 (L) 02/19/2019   K 3.7 02/19/2019   CL 97 (L) 02/19/2019   CREATININE 1.10 (H) 02/19/2019   BUN 19 02/19/2019   CO2 26 02/19/2019   TSH 0.484 03/31/2013   INR 0.9 02/18/2019   HGBA1C 6.1 (H) 02/16/2019   Stable Good uop with lasix today Walking well    Grace Isaac MD  Beeper [phone] Office [phone] 02/19/2019 5:45 PM

## 2019-02-19 NOTE — Progress Notes (Signed)
CARDIAC REHAB PHASE I   PRE:  Rate/Rhythm: 75 SR  BP:  Sitting: 121/71     SaO2: 96 RA  MODE:  Ambulation: 660 ft   POST:  Rate/Rhythm: 92 SR  BP:  Sitting: 122/92    SaO2: 97 RA  Pt ambulated 610ft in hallway assist of 2 for safety and equipment with EVA. Pt denies pain or SOB. Pt returned to recliner, bedside table and call bell within reach. Will continue to follow.  [id number] Rufina Falco, RN BSN 02/19/2019 3:16 PM

## 2019-02-19 NOTE — Plan of Care (Signed)
  Problem: Education: Goal: Knowledge of General Education information will improve Description: Including pain rating scale, medication(s)/side effects and non-pharmacologic comfort measures Outcome: Progressing   Problem: Clinical Measurements: Goal: Ability to maintain clinical measurements within normal limits will improve Outcome: Progressing Goal: Will remain free from infection Outcome: Progressing   Problem: Activity: Goal: Risk for activity intolerance will decrease Outcome: Progressing   Problem: Nutrition: Goal: Adequate nutrition will be maintained Outcome: Progressing   Problem: Pain Managment: Goal: General experience of comfort will improve Outcome: Progressing   Problem: Skin Integrity: Goal: Risk for impaired skin integrity will decrease Outcome: Progressing

## 2019-02-19 NOTE — Progress Notes (Signed)
1 Day Post-Op Procedure(s) (LRB): STERNOTOMY (N/A) RESECTION OF MEDIASTINAL MASS (N/A) Subjective: Breathing well off BiPaP- pCO2 < 50  CXR clear No air leak BP high on iv NTG Objective: Vital signs in last 24 hours: Temp:  [97.7 F (36.5 C)-99.9 F (37.7 C)] 98.6 F (37 C) (11/20 0700) Pulse Rate:  [58-84] 73 (11/20 0700) Cardiac Rhythm: Sinus bradycardia (11/19 2000) Resp:  [9-26] 15 (11/20 0700) BP: (90-167)/(53-83) 136/68 (11/20 0700) SpO2:  [97 %-100 %] 98 % (11/20 0700) Arterial Line BP: (99-207)/(37-81) 169/62 (11/20 0700) FiO2 (%):  [40 %] 40 % (11/19 1932) Weight:  [71.4 kg] 71.4 kg (11/20 0600)  Hemodynamic parameters for last 24 hours:  nsr  Intake/Output from previous day: 11/19 0701 - 11/20 0700 In: 2377.3 [I.V.:2017.4; IV Piggyback:359.9] Out: [id number] [Urine:1025; Blood:50; Chest Tube:230] Intake/Output this shift: No intake/output data recorded.       Exam    General- alert and comfortable    Neck- no JVD, no cervical adenopathy palpable, no carotid bruit   Lungs- clear without rales, wheezes   Cor- regular rate and rhythm, no murmur , gallop   Abdomen- soft, non-tender   Extremities - warm, non-tender, minimal edema   Neuro- oriented, appropriate, no focal weakness   Lab Results: Recent Labs    02/18/19 1030  02/19/19 0306 02/19/19 0406 02/19/19 0426  WBC 15.5*  --  13.3*  --   --   HGB 13.6   < > 11.1* 11.9* 11.6*  HCT 40.4   < > 34.0* 35.0* 34.0*  PLT 276  --  250  --   --    < > = values in this interval not displayed.   BMET:  Recent Labs    02/18/19 1214  02/19/19 0306 02/19/19 0406 02/19/19 0426  NA 135   < > 134* 134* 134*  K 4.1   < > 3.9 3.7 3.7  CL 100  --  97*  --   --   CO2 27  --  26  --   --   GLUCOSE 195*  --  116*  --   --   BUN 12  --  19  --   --   CREATININE 0.88  --  1.10*  --   --   CALCIUM 8.9  --  8.6*  --   --    < > = values in this interval not displayed.    PT/INR:  Recent Labs    02/18/19 1030   LABPROT 12.3  INR 0.9   ABG    Component Value Date/Time   PHART 7.405 02/19/2019 0426   HCO3 28.0 02/19/2019 0426   TCO2 29 02/19/2019 0426   O2SAT 96.0 02/19/2019 0426   CBG (last 3)  Recent Labs    02/18/19 1537 02/18/19 2135 02/19/19 0751  GLUCAP 120* 154* 116*    Assessment/Plan: S/P Procedure(s) (LRB): STERNOTOMY (N/A) RESECTION OF MEDIASTINAL MASS (N/A) Resection 6 cm thymoma Hx COPD, FEV1 800 cc with postop hypercapnia now better and off BiPaP Hx Etoh abuse on thiamine Will DC a-line, MT Transfer to [facility]   LOS: 1 day    Holly May 02/19/2019

## 2019-02-20 ENCOUNTER — Other Ambulatory Visit: Payer: Self-pay

## 2019-02-20 ENCOUNTER — Inpatient Hospital Stay (HOSPITAL_COMMUNITY): Payer: Medicare Other

## 2019-02-20 LAB — GLUCOSE, CAPILLARY
Glucose-Capillary: 89 mg/dL (ref 70–99)
Glucose-Capillary: 193 mg/dL — ABNORMAL HIGH (ref 70–99)
Glucose-Capillary: 72 mg/dL (ref 70–99)
Glucose-Capillary: 133 mg/dL — ABNORMAL HIGH (ref 70–99)
Glucose-Capillary: 107 mg/dL — ABNORMAL HIGH (ref 70–99)

## 2019-02-20 NOTE — Progress Notes (Signed)
Patient ID: Holly May, female   DOB: 10-13-1950, 68 y.o.   MRN: [id number] TCTS DAILY ICU PROGRESS NOTE                   [address]            [place] [address]          [phone]   2 Days Post-Op Procedure(s) (LRB): STERNOTOMY (N/A) RESECTION OF MEDIASTINAL MASS (N/A)  Total Length of Stay:  LOS: 2 days   Subjective: Patient up in chair talkative no respiratory distress  Objective: Vital signs in last 24 hours: Temp:  [98 F (36.7 C)-98.8 F (37.1 C)] 98.3 F (36.8 C) (11/21 0801) Pulse Rate:  [65-82] 82 (11/21 0700) Cardiac Rhythm: Normal sinus rhythm (11/21 0400) Resp:  [11-24] 13 (11/21 0600) BP: (107-177)/(64-99) 171/81 (11/21 0700) SpO2:  [94 %-100 %] 96 % (11/21 0749) Arterial Line BP: (180-201)/(68-72) 180/72 (11/20 0930) Weight:  [67.4 kg] 67.4 kg (11/21 0600)  Filed Weights   02/18/19 0617 02/19/19 0600 02/20/19 0600  Weight: 67.7 kg 71.4 kg 67.4 kg    Weight change: -4 kg   Hemodynamic parameters for last 24 hours:    Intake/Output from previous day: 11/20 0701 - 11/21 0700 In: 1091.7 [P.O.:700; I.V.:191.6; IV Piggyback:200.1] Out: 3990 [Urine:3860; Chest Tube:130]  Intake/Output this shift: No intake/output data recorded.  Current Meds: Scheduled Meds: . acetaminophen  1,000 mg Oral Q6H   Or  . acetaminophen (TYLENOL) oral liquid 160 mg/5 mL  1,000 mg Per Tube Q6H  . acetaminophen (TYLENOL) oral liquid 160 mg/5 mL  650 mg Per Tube Once   Or  . acetaminophen  650 mg Rectal Once  . amLODipine  10 mg Oral Daily  . bisacodyl  10 mg Oral Daily   Or  . bisacodyl  10 mg Rectal Daily  . Chlorhexidine Gluconate Cloth  6 each Topical Daily  . citalopram  20 mg Oral Daily  . docusate sodium  200 mg Oral Daily  . furosemide  20 mg Intravenous Daily  . gabapentin  600 mg Oral BID  . hydrochlorothiazide  25 mg Oral Daily  . insulin aspart  0-15 Units Subcutaneous TID WC  . metoprolol succinate  25 mg Oral Daily  .  multivitamins with iron  1 tablet Oral Daily  . nicotine  14 mg Transdermal Daily  . oxybutynin  5 mg Oral TID  . pantoprazole  40 mg Oral Daily  . pravastatin  40 mg Oral q1800  . sodium chloride flush  3 mL Intravenous Q12H  . thiamine  100 mg Oral Daily  . traZODone  150 mg Oral QHS  . umeclidinium bromide  1 puff Inhalation Daily   Continuous Infusions: . sodium chloride Stopped (02/19/19 0856)  . sodium chloride    . sodium chloride Stopped (02/19/19 1730)  . cefUROXime (ZINACEF)  IV Stopped (02/19/19 2228)  . lactated ringers     PRN Meds:.sodium chloride, dextrose, fentaNYL (SUBLIMAZE) injection, hydrALAZINE, levalbuterol, midazolam, ondansetron (ZOFRAN) IV, oxyCODONE, sodium chloride flush, traMADol  General appearance: alert, cooperative and no distress Neurologic: intact Heart: regular rate and rhythm, S1, S2 normal, no murmur, click, rub or gallop Lungs: diminished breath sounds bibasilar Abdomen: soft, non-tender; bowel sounds normal; no masses,  no organomegaly Extremities: extremities normal, atraumatic, no cyanosis or edema and Homans sign is negative, no sign of DVT Wound: Dressings intact  Lab Results: CBC: Recent Labs    02/19/19 0306  02/19/19  [id number] 02/19/19 1720  WBC 13.3*  --   --  13.6*  HGB 11.1*   < > 11.6* 12.6  HCT 34.0*   < > 34.0* 37.0  PLT 250  --   --  255   < > = values in this interval not displayed.   BMET:  Recent Labs    02/19/19 0306  02/19/19 0426 02/19/19 1720  NA 134*   < > 134* 135  K 3.9   < > 3.7 3.4*  CL 97*  --   --  95*  CO2 26  --   --  30  GLUCOSE 116*  --   --  86  BUN 19  --   --  16  CREATININE 1.10*  --   --  0.88  CALCIUM 8.6*  --   --  9.2   < > = values in this interval not displayed.    CMET: Lab Results  Component Value Date   WBC 13.6 (H) 02/19/2019   HGB 12.6 02/19/2019   HCT 37.0 02/19/2019   PLT 255 02/19/2019   GLUCOSE 86 02/19/2019   ALT 11 02/16/2019   AST 14 (L) 02/16/2019   NA 135  02/19/2019   K 3.4 (L) 02/19/2019   CL 95 (L) 02/19/2019   CREATININE 0.88 02/19/2019   BUN 16 02/19/2019   CO2 30 02/19/2019   TSH 0.484 03/31/2013   INR 0.9 02/18/2019   HGBA1C 6.1 (H) 02/16/2019      PT/INR:  Recent Labs    02/18/19 1030  LABPROT 12.3  INR 0.9   Radiology: No results found.   Assessment/Plan: S/P Procedure(s) (LRB): STERNOTOMY (N/A) RESECTION OF MEDIASTINAL MASS (N/A) Mobilize d/c tubes/lines DC chest tubes today, DC Foley Increase ambulation possible home in 2 days    Grace Isaac 02/20/2019 8:38 AM

## 2019-02-20 NOTE — Progress Notes (Signed)
Patient ID: Holly May, female   DOB: 1950-04-14, 68 y.o.   MRN: [id number] EVENING ROUNDS NOTE :     [address]       [place] [address]             [phone]                 2 Days Post-Op Procedure(s) (LRB): STERNOTOMY (N/A) RESECTION OF MEDIASTINAL MASS (N/A)  Total Length of Stay:  LOS: 2 days  BP (!) 136/118 (BP Location: Right Arm)   Pulse 77   Temp 98.7 F (37.1 C) (Oral)   Resp 16   Ht 5\' 9"  (1.753 m)   Wt 67.4 kg   SpO2 97%   BMI 21.94 kg/m   .Intake/Output      11/20 0701 - 11/21 0700 11/21 0701 - 11/22 0700   P.O. 700 480   I.V. (mL/kg) 191.6 (2.8)    Other  160   IV Piggyback 200.1 100   Total Intake(mL/kg) 1091.7 (16.2) 740 (11)   Urine (mL/kg/hr) 3860 (2.4) 395 (0.5)   Emesis/NG output  0   Stool  0   Blood     Chest Tube 130 75   Total Output 3990 470   Net -2898.3 +270        Urine Occurrence  2 x   Stool Occurrence  0 x   Emesis Occurrence  0 x     . sodium chloride Stopped (02/19/19 0856)  . sodium chloride    . sodium chloride Stopped (02/19/19 1730)  . lactated ringers       Lab Results  Component Value Date   WBC 13.6 (H) 02/19/2019   HGB 12.6 02/19/2019   HCT 37.0 02/19/2019   PLT 255 02/19/2019   GLUCOSE 86 02/19/2019   ALT 11 02/16/2019   AST 14 (L) 02/16/2019   NA 135 02/19/2019   K 3.4 (L) 02/19/2019   CL 95 (L) 02/19/2019   CREATININE 0.88 02/19/2019   BUN 16 02/19/2019   CO2 30 02/19/2019   TSH 0.484 03/31/2013   INR 0.9 02/18/2019   HGBA1C 6.1 (H) 02/16/2019   Chest tubes and foley out Stable day    Grace Isaac MD  Beeper [phone] Office [phone] 02/20/2019 6:57 PM

## 2019-02-21 ENCOUNTER — Inpatient Hospital Stay (HOSPITAL_COMMUNITY): Payer: Medicare Other

## 2019-02-21 LAB — CBC
HCT: 37.9 % (ref 36.0–46.0)
Hemoglobin: 12.5 g/dL (ref 12.0–15.0)
MCH: 32.7 pg (ref 26.0–34.0)
MCHC: 33 g/dL (ref 30.0–36.0)
MCV: 99.2 fL (ref 80.0–100.0)
Platelets: 258 10*3/uL (ref 150–400)
RBC: 3.82 MIL/uL — ABNORMAL LOW (ref 3.87–5.11)
RDW: 13.9 % (ref 11.5–15.5)
WBC: 12.1 10*3/uL — ABNORMAL HIGH (ref 4.0–10.5)
nRBC: 0 % (ref 0.0–0.2)

## 2019-02-21 LAB — GLUCOSE, CAPILLARY
Glucose-Capillary: 114 mg/dL — ABNORMAL HIGH (ref 70–99)
Glucose-Capillary: 121 mg/dL — ABNORMAL HIGH (ref 70–99)
Glucose-Capillary: 165 mg/dL — ABNORMAL HIGH (ref 70–99)

## 2019-02-21 LAB — BASIC METABOLIC PANEL
Anion gap: 8 (ref 5–15)
BUN: 18 mg/dL (ref 8–23)
CO2: 31 mmol/L (ref 22–32)
Calcium: 9.2 mg/dL (ref 8.9–10.3)
Chloride: 96 mmol/L — ABNORMAL LOW (ref 98–111)
Creatinine, Ser: 0.81 mg/dL (ref 0.44–1.00)
GFR calc Af Amer: >60 mL/min (ref >60)
GFR calc non Af Amer: >60 mL/min (ref >60)
Glucose, Bld: 103 mg/dL — ABNORMAL HIGH (ref 70–99)
Potassium: 3.8 mmol/L (ref 3.5–5.1)
Sodium: 135 mmol/L (ref 135–145)

## 2019-02-21 NOTE — Progress Notes (Signed)
Patient ID: Holly May, female   DOB: [birth year], 68 y.o.   MRN: [id number] TCTS DAILY ICU PROGRESS NOTE                   [address]            [place] [address]          [phone]   3 Days Post-Op Procedure(s) (LRB): STERNOTOMY (N/A) RESECTION OF MEDIASTINAL MASS (N/A)  Total Length of Stay:  LOS: 3 days   Subjective: Up in chair walking unit without walker this morning  Objective: Vital signs in last 24 hours: Temp:  [97.8 F (36.6 C)-98.7 F (37.1 C)] 98.6 F (37 C) (11/22 0600) Pulse Rate:  [66-94] 86 (11/22 0700) Cardiac Rhythm: Normal sinus rhythm (11/22 0400) Resp:  [15-16] 16 (11/21 1200) BP: (94-164)/(56-118) 141/71 (11/22 0700) SpO2:  [89 %-98 %] 96 % (11/22 0700) Weight:  [67.4 kg] 67.4 kg (11/22 0600)  Filed Weights   02/19/19 0600 02/20/19 0600 02/21/19 0600  Weight: 71.4 kg 67.4 kg 67.4 kg    Weight change: 0 kg   Hemodynamic parameters for last 24 hours:    Intake/Output from previous day: 11/21 0701 - 11/22 0700 In: 770 [P.O.:510; IV Piggyback:100] Out: 470 [Urine:395; Chest Tube:75]  Intake/Output this shift: No intake/output data recorded.  Current Meds: Scheduled Meds: . acetaminophen  1,000 mg Oral Q6H   Or  . acetaminophen (TYLENOL) oral liquid 160 mg/5 mL  1,000 mg Per Tube Q6H  . acetaminophen (TYLENOL) oral liquid 160 mg/5 mL  650 mg Per Tube Once   Or  . acetaminophen  650 mg Rectal Once  . amLODipine  10 mg Oral Daily  . bisacodyl  10 mg Oral Daily   Or  . bisacodyl  10 mg Rectal Daily  . Chlorhexidine Gluconate Cloth  6 each Topical Daily  . citalopram  20 mg Oral Daily  . docusate sodium  200 mg Oral Daily  . gabapentin  600 mg Oral BID  . hydrochlorothiazide  25 mg Oral Daily  . insulin aspart  0-15 Units Subcutaneous TID WC  . metoprolol succinate  25 mg Oral Daily  . multivitamins with iron  1 tablet Oral Daily  . nicotine  14 mg Transdermal Daily  . oxybutynin  5 mg Oral TID  . pantoprazole   40 mg Oral Daily  . pravastatin  40 mg Oral q1800  . sodium chloride flush  3 mL Intravenous Q12H  . thiamine  100 mg Oral Daily  . traZODone  150 mg Oral QHS  . umeclidinium bromide  1 puff Inhalation Daily   Continuous Infusions: . sodium chloride Stopped (02/19/19 0856)  . sodium chloride    . sodium chloride Stopped (02/19/19 1730)  . lactated ringers     PRN Meds:.sodium chloride, dextrose, fentaNYL (SUBLIMAZE) injection, hydrALAZINE, levalbuterol, midazolam, ondansetron (ZOFRAN) IV, oxyCODONE, sodium chloride flush, traMADol  General appearance: alert and cooperative Neurologic: intact Heart: regular rate and rhythm, S1, S2 normal, no murmur, click, rub or gallop Lungs: diminished breath sounds bibasilar Abdomen: soft, non-tender; bowel sounds normal; no masses,  no organomegaly Extremities: extremities normal, atraumatic, no cyanosis or edema and Homans sign is negative, no sign of DVT  Lab Results: CBC: Recent Labs    02/19/19 1720 02/21/19 0502  WBC 13.6* 12.1*  HGB 12.6 12.5  HCT 37.0 37.9  PLT 255 258   BMET:  Recent Labs    02/19/19 1720 02/21/19 0502  NA 135 135  K 3.4* 3.8  CL 95* 96*  CO2 30 31  GLUCOSE 86 103*  BUN 16 18  CREATININE 0.88 0.81  CALCIUM 9.2 9.2    CMET: Lab Results  Component Value Date   WBC 12.1 (H) 02/21/2019   HGB 12.5 02/21/2019   HCT 37.9 02/21/2019   PLT 258 02/21/2019   GLUCOSE 103 (H) 02/21/2019   ALT 11 02/16/2019   AST 14 (L) 02/16/2019   NA 135 02/21/2019   K 3.8 02/21/2019   CL 96 (L) 02/21/2019   CREATININE 0.81 02/21/2019   BUN 18 02/21/2019   CO2 31 02/21/2019   TSH 0.484 03/31/2013   INR 0.9 02/18/2019   HGBA1C 6.1 (H) 02/16/2019      PT/INR:  Recent Labs    02/18/19 1030  LABPROT 12.3  INR 0.9   Radiology: No results found.   Assessment/Plan: S/P Procedure(s) (LRB): STERNOTOMY (N/A) RESECTION OF MEDIASTINAL MASS (N/A) DC central line Has transfer orders not moved yet PA and lateral  chest x-ray tomorrow Possible home 1 to 2 days     Grace Isaac 02/21/2019 8:43 AM

## 2019-02-21 NOTE — Plan of Care (Signed)

## 2019-02-21 NOTE — Anesthesia Postprocedure Evaluation (Signed)
Anesthesia Post Note  Patient: Holly May  Procedure(s) Performed: STERNOTOMY (N/A Chest) RESECTION OF MEDIASTINAL MASS (N/A Chest)     Patient location during evaluation: PACU Anesthesia Type: General Level of consciousness: awake and alert Pain management: pain level controlled Vital Signs Assessment: post-procedure vital signs reviewed and stable Respiratory status: spontaneous breathing (Patient on BiPap) Cardiovascular status: blood pressure returned to baseline and stable Postop Assessment: no apparent nausea or vomiting Anesthetic complications: no    Last Vitals:  Vitals:   02/21/19 1614 02/21/19 1929  BP: (!) 171/83 (!) 146/75  Pulse: 76 80  Resp: 15 (!) 30  Temp: 36.8 C 36.6 C  SpO2: 100% 98%    Last Pain:  Vitals:   02/21/19 1929  TempSrc: Oral  PainSc:                  Gaudencio Chesnut

## 2019-02-22 ENCOUNTER — Inpatient Hospital Stay (HOSPITAL_COMMUNITY): Payer: Medicare Other

## 2019-02-22 LAB — GLUCOSE, CAPILLARY
Glucose-Capillary: 117 mg/dL — ABNORMAL HIGH (ref 70–99)
Glucose-Capillary: 102 mg/dL — ABNORMAL HIGH (ref 70–99)

## 2019-02-22 LAB — BPAM RBC
Blood Product Expiration Date: 202012102359
Blood Product Expiration Date: 202012132359
ISSUE DATE / TIME: 202011161459
Unit Type and Rh: 6200
Unit Type and Rh: 6200

## 2019-02-22 LAB — TYPE AND SCREEN
ABO/RH(D): A POS
Antibody Screen: NEGATIVE
Unit division: 0
Unit division: 0

## 2019-02-22 MED ORDER — LISINOPRIL 20 MG PO TABS
20.0000 mg | ORAL_TABLET | Freq: Every day | ORAL | Status: DC
  Administered 2019-02-22: 20 mg via ORAL
  Filled 2019-02-22: qty 1

## 2019-02-22 MED ORDER — GUAIFENESIN ER 600 MG PO TB12
600.0000 mg | ORAL_TABLET | Freq: Two times a day (BID) | ORAL | Status: DC
  Administered 2019-02-22: 600 mg via ORAL
  Filled 2019-02-22: qty 1

## 2019-02-22 MED ORDER — SORBITOL 70 % PO SOLN
50.0000 mL | Freq: Once | ORAL | Status: DC
  Filled 2019-02-22: qty 50

## 2019-02-22 MED ORDER — TRAMADOL HCL 50 MG PO TABS: 50.0000 mg | ORAL_TABLET | ORAL | 0 refills | Status: AC | PRN

## 2019-02-22 MED ORDER — GUAIFENESIN ER 600 MG PO TB12: 600.0000 mg | ORAL_TABLET | Freq: Two times a day (BID) | ORAL | 1 refills | Status: DC

## 2019-02-22 MED ORDER — SORBITOL 70 % SOLN
50.0000 mL | Freq: Once | Status: AC
  Administered 2019-02-22: 50 mL via ORAL
  Filled 2019-02-22: qty 60

## 2019-02-22 NOTE — Progress Notes (Signed)
Patient ambulated hall with standby assist approximately 400 ft and tolerated well.

## 2019-02-22 NOTE — Plan of Care (Signed)
  Problem: Education: Goal: Knowledge of General Education information will improve Description: Including pain rating scale, medication(s)/side effects and non-pharmacologic comfort measures 02/22/2019 1257 by Shanon Ace, RN Outcome: Adequate for Discharge 02/22/2019 0714 by Shanon Ace, RN Outcome: Progressing   Problem: Health Behavior/Discharge Planning: Goal: Ability to manage health-related needs will improve 02/22/2019 1257 by Shanon Ace, RN Outcome: Adequate for Discharge 02/22/2019 0714 by Shanon Ace, RN Outcome: Progressing   Problem: Clinical Measurements: Goal: Ability to maintain clinical measurements within normal limits will improve 02/22/2019 1257 by Shanon Ace, RN Outcome: Adequate for Discharge 02/22/2019 0714 by Shanon Ace, RN Outcome: Progressing Goal: Will remain free from infection 02/22/2019 1257 by Shanon Ace, RN Outcome: Adequate for Discharge 02/22/2019 0714 by Shanon Ace, RN Outcome: Progressing Goal: Diagnostic test results will improve 02/22/2019 1257 by Shanon Ace, RN Outcome: Adequate for Discharge 02/22/2019 0714 by Shanon Ace, RN Outcome: Progressing Goal: Respiratory complications will improve 02/22/2019 1257 by Shanon Ace, RN Outcome: Adequate for Discharge 02/22/2019 0714 by Shanon Ace, RN Outcome: Progressing Goal: Cardiovascular complication will be avoided 02/22/2019 1257 by Shanon Ace, RN Outcome: Adequate for Discharge 02/22/2019 0714 by Shanon Ace, RN Outcome: Progressing   Problem: Activity: Goal: Risk for activity intolerance will decrease 02/22/2019 1257 by Shanon Ace, RN Outcome: Adequate for Discharge 02/22/2019 0714 by Shanon Ace, RN Outcome: Progressing   Problem: Nutrition: Goal: Adequate nutrition will be maintained 02/22/2019 1257 by Shanon Ace, RN Outcome: Adequate for Discharge 02/22/2019 0714 by Shanon Ace, RN Outcome: Progressing   Problem:  Coping: Goal: Level of anxiety will decrease 02/22/2019 1257 by Shanon Ace, RN Outcome: Adequate for Discharge 02/22/2019 0714 by Shanon Ace, RN Outcome: Progressing   Problem: Elimination: Goal: Will not experience complications related to bowel motility 02/22/2019 1257 by Shanon Ace, RN Outcome: Adequate for Discharge 02/22/2019 0714 by Shanon Ace, RN Outcome: Progressing Goal: Will not experience complications related to urinary retention 02/22/2019 1257 by Shanon Ace, RN Outcome: Adequate for Discharge 02/22/2019 0714 by Shanon Ace, RN Outcome: Progressing   Problem: Pain Managment: Goal: General experience of comfort will improve 02/22/2019 1257 by Shanon Ace, RN Outcome: Adequate for Discharge 02/22/2019 0714 by Shanon Ace, RN Outcome: Progressing   Problem: Safety: Goal: Ability to remain free from injury will improve 02/22/2019 1257 by Shanon Ace, RN Outcome: Adequate for Discharge 02/22/2019 0714 by Shanon Ace, RN Outcome: Progressing   Problem: Pain Managment: Goal: General experience of comfort will improve 02/22/2019 1257 by Shanon Ace, RN Outcome: Adequate for Discharge 02/22/2019 0714 by Shanon Ace, RN Outcome: Progressing   Problem: Safety: Goal: Ability to remain free from injury will improve 02/22/2019 1257 by Shanon Ace, RN Outcome: Adequate for Discharge 02/22/2019 0714 by Shanon Ace, RN Outcome: Progressing   Problem: Skin Integrity: Goal: Risk for impaired skin integrity will decrease 02/22/2019 1257 by Shanon Ace, RN Outcome: Adequate for Discharge 02/22/2019 0714 by Shanon Ace, RN Outcome: Progressing

## 2019-02-22 NOTE — Plan of Care (Signed)

## 2019-02-22 NOTE — Progress Notes (Addendum)
Pt being discharged home.  Prescription for Home glucometer, handwritten was given directly to the Pt. She requested script for scale and blood pressure cuff, she was instructed by PA to contact her PCP for those items

## 2019-02-22 NOTE — Progress Notes (Signed)
Per the Pt Dr. Prescott Gum cut the Pt's Fall risk band off her wrist

## 2019-02-22 NOTE — Progress Notes (Addendum)
4 Days Post-Op Procedure(s) (LRB): STERNOTOMY (N/A) RESECTION OF MEDIASTINAL MASS (N/A) Subjective: Awake and alert, resting in bed. No complaints or concerns. Walked in hall in ICU yesterday before transferring to [facility].   Objective: Vital signs in last 24 hours: Temp:  [97.9 F (36.6 C)-99.7 F (37.6 C)] 98.3 F (36.8 C) (11/23 0738) Pulse Rate:  [73-89] 89 (11/23 0523) Cardiac Rhythm: Normal sinus rhythm (11/22 2030) Resp:  [11-30] 15 (11/23 0738) BP: (142-171)/(68-95) 156/73 (11/23 0738) SpO2:  [92 %-100 %] 92 % (11/23 0523) Weight:  [67.7 kg] 67.7 kg (11/23 0523)  Intake/Output from previous day: 11/22 0701 - 11/23 0700 In: 500 [P.O.:500] Out: 0  Intake/Output this shift: No intake/output data recorded.  General appearance: alert, cooperative and no distress Neurologic: intact Heart: RRR. monitor shows NSR, no arrhythimas.  Lungs:  Breath sounds CTA, CXR shows clear lung fields, trce bilateral effusions and mild ATX.  Abdomen: soft and non-tender.  Extremities: no edema, all well perfused.  Wound: the sternotomy incision and CT sites are dry and intact.   Lab Results: Recent Labs    02/19/19 1720 02/21/19 0502  WBC 13.6* 12.1*  HGB 12.6 12.5  HCT 37.0 37.9  PLT 255 258   BMET:  Recent Labs    02/19/19 1720 02/21/19 0502  NA 135 135  K 3.4* 3.8  CL 95* 96*  CO2 30 31  GLUCOSE 86 103*  BUN 16 18  CREATININE 0.88 0.81  CALCIUM 9.2 9.2    PT/INR: No results for input(s): LABPROT, INR in the last 72 hours. 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 Doing well with  moblity, cardiac rhythm is stable and she is tolerating diet. Pain controlled with oral medications. No bowel movement yet. Says she normally uses a suppository to have a bowel movement.  Suspect she may discharge later today.   -History of hypertension- SBP 150-160's past 24 hours. Re-starting lisinopril.   -Type 2 Diabetes Mellitus-glucose well controlled with SSI, resume metformin at discharge.   -Renal- has h/o renal failure, creat now normal and stable.    LOS: 4 days    Antony Odea, PA-C [phone] 02/22/2019   2 view CXR today is clear O2 sat on room air 92% Sternal incision clean,dry DC instructions reviewed with patient- wound care, activity limits and meds patient examined and medical record reviewed,agree with above note. Tharon Aquas Trigt III 02/22/2019

## 2019-02-22 NOTE — Progress Notes (Signed)
CARDIAC REHAB PHASE I   PRE:  Rate/Rhythm: 84 SR  BP:  Sitting: 148/66      SaO2: 97 RA  MODE:  Ambulation: 400 ft   POST:  Rate/Rhythm: 92 SR  BP:  Sitting: 150/71    SaO2: 98 RA  Pt ambulated >487ft in hallway independently with steady gait. Pt denies pain or SOB. Pt educated on site care, and importance of monitoring incisions daily. Pt given in-the-tube sheet along with diabetic diet, and smoking cessation tip sheet. Pt states she has no intention of quitting smoking. Reviewed restrictions, and exercise guidelines. Pt does not meet criteria for CRP II at this time.   [id number] Rufina Falco, RN BSN 02/22/2019 9:08 AM

## 2019-02-22 NOTE — Care Management Important Message (Signed)
Important Message  Patient Details  Name: Holly May MRN: [id number] Date of Birth: [birth year]   Medicare Important Message Given:  Yes     Memory Argue 02/22/2019, 12:23 PM

## 2019-02-23 LAB — GLUCOSE, CAPILLARY: Glucose-Capillary: 130 mg/dL — ABNORMAL HIGH (ref 70–99)

## 2019-02-24 LAB — SURGICAL PATHOLOGY

## 2019-02-26 MED FILL — Heparin Sodium (Porcine) Inj 1000 Unit/ML: INTRAMUSCULAR | Qty: 30 | Status: AC

## 2019-02-26 MED FILL — Sodium Chloride IV Soln 0.9%: INTRAVENOUS | Qty: 2000 | Status: AC

## 2019-03-01 LAB — SURGICAL PATHOLOGY

## 2019-03-17 IMAGING — DX DG CHEST 2V
2 series · 2 of 2 positions shown · non-contrast
Comparison: 06/12/2016 chest radiograph.

CLINICAL DATA: Cough.  COPD.

EXAM:
CHEST  2 VIEW

[chest pa]
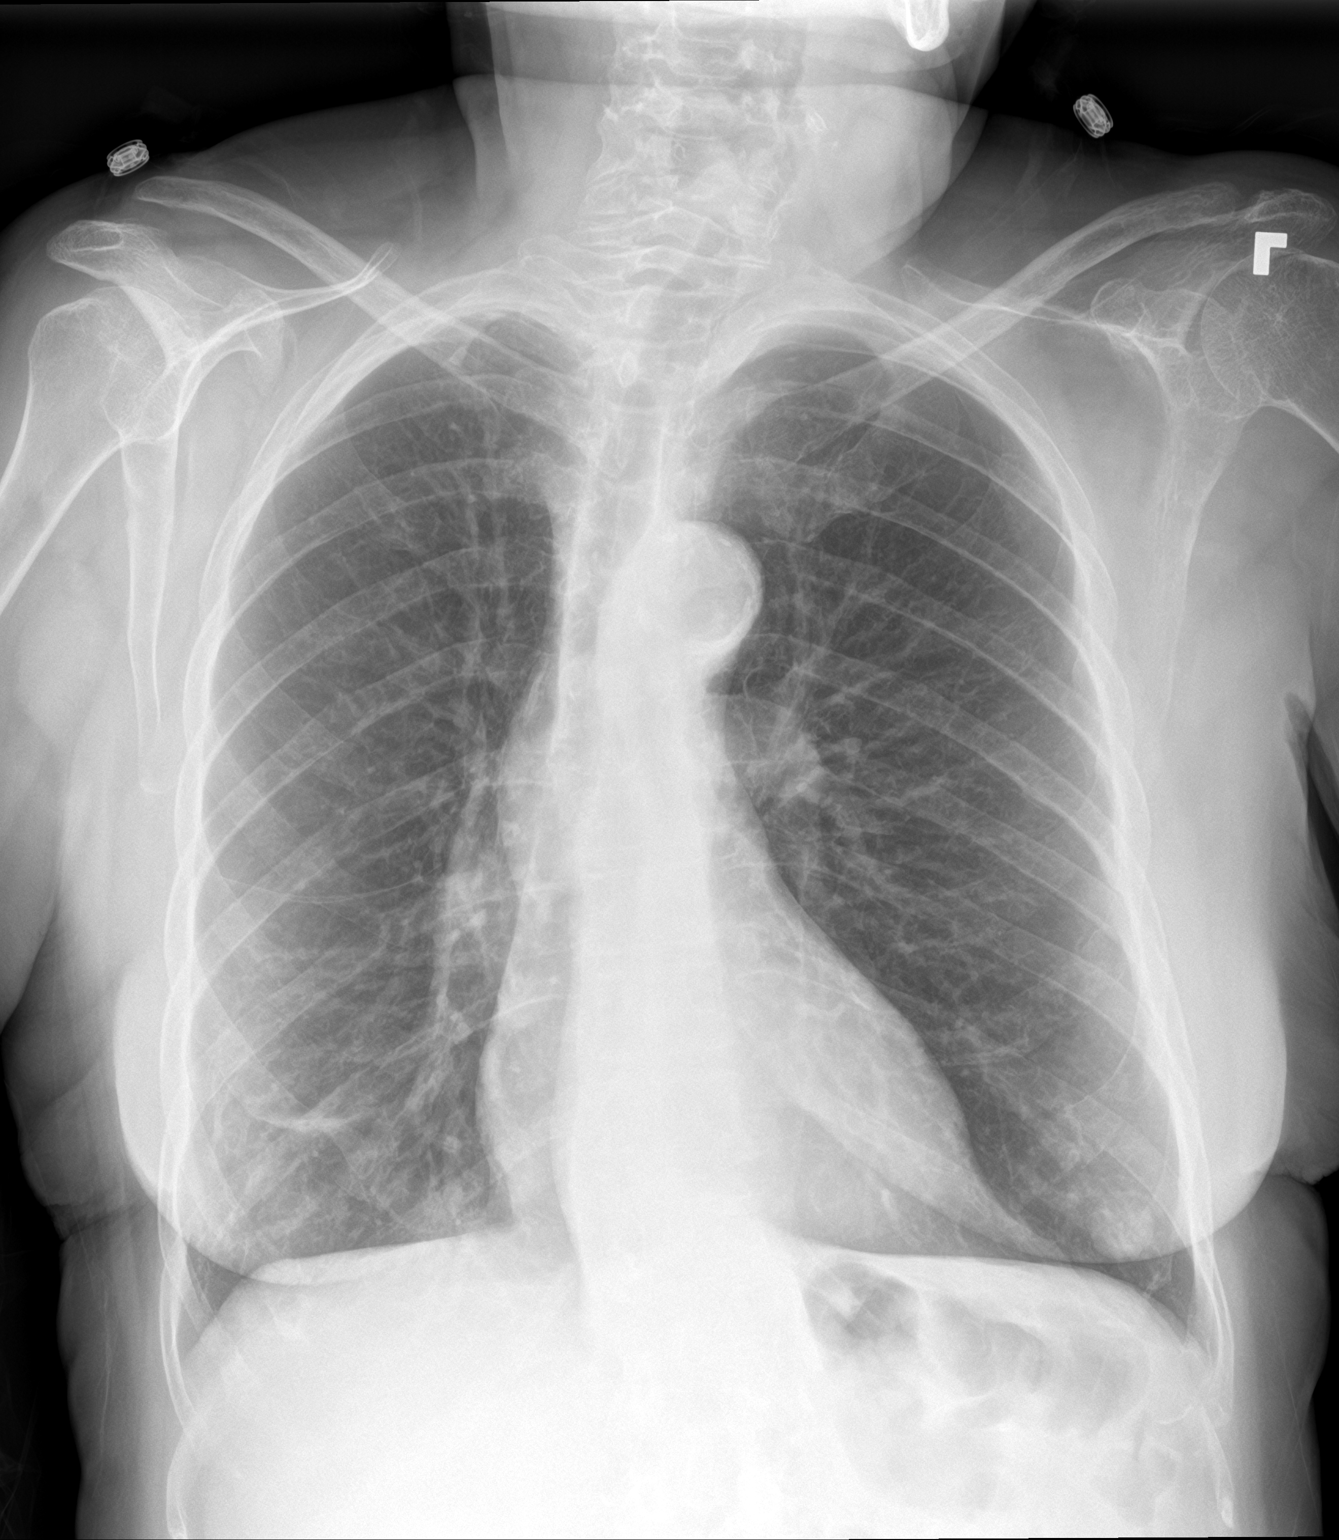

[chest lat]
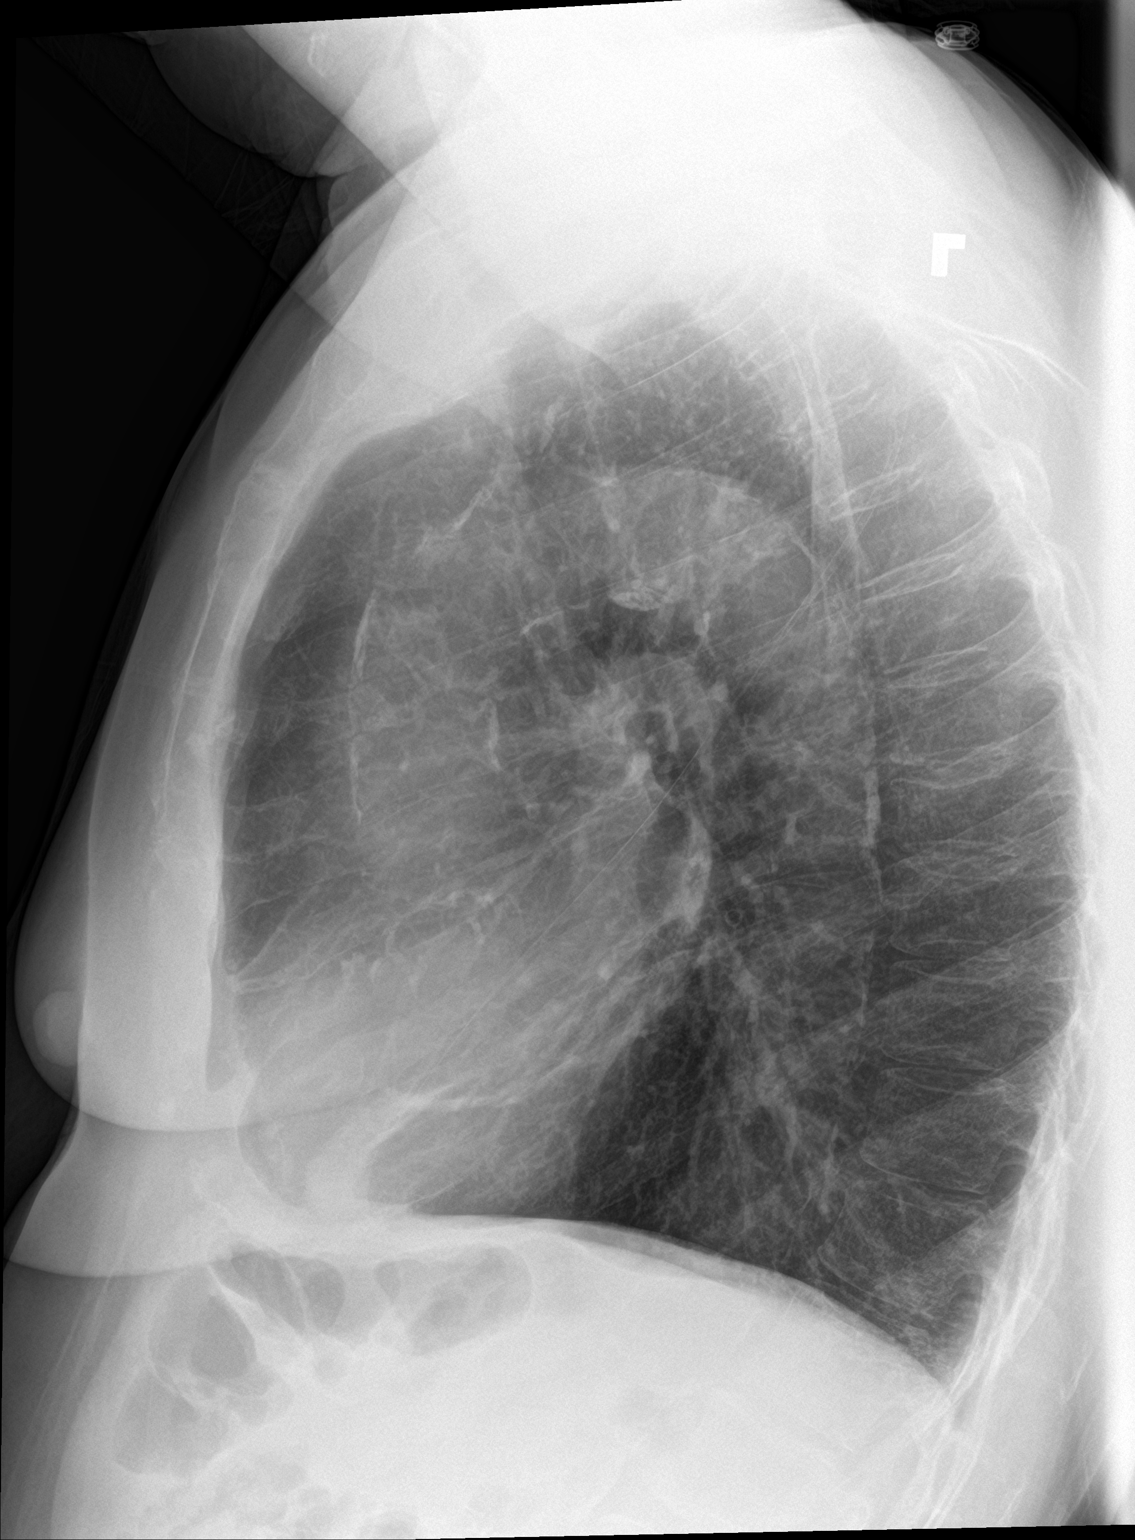

[2 of 2 positions shown; findings below may reference images not displayed]

FINDINGS: Stable cardiomediastinal silhouette with normal heart size and
aortic atherosclerosis. No pneumothorax. No pleural effusion.
Hyperinflated lungs. New small bandlike focus of consolidation in
the right middle lobe. No pulmonary edema.
IMPRESSION: New small bandlike focus of consolidation in the right middle lobe
suggesting pneumonia. Recommend follow-up PA and lateral post
treatment chest radiographs in 4-6 weeks.

## 2019-03-30 ENCOUNTER — Other Ambulatory Visit: Payer: Self-pay | Admitting: Cardiothoracic Surgery

## 2019-03-30 DIAGNOSIS — J9859 Other diseases of mediastinum, not elsewhere classified: Secondary | ICD-10-CM

## 2019-03-31 ENCOUNTER — Other Ambulatory Visit: Payer: Self-pay

## 2019-03-31 ENCOUNTER — Encounter: Payer: Self-pay | Admitting: Cardiothoracic Surgery

## 2019-03-31 ENCOUNTER — Ambulatory Visit
Admission: RE | Admit: 2019-03-31 | Discharge: 2019-03-31 | Disposition: A | Discharge status: Home / Self Care | Payer: Medicare Other | Source: Ambulatory Visit | Attending: Cardiothoracic Surgery | Admitting: Cardiothoracic Surgery

## 2019-03-31 ENCOUNTER — Ambulatory Visit (INDEPENDENT_AMBULATORY_CARE_PROVIDER_SITE_OTHER): Payer: Self-pay | Admitting: Cardiothoracic Surgery

## 2019-03-31 DIAGNOSIS — J9859 Other diseases of mediastinum, not elsewhere classified: Secondary | ICD-10-CM

## 2019-03-31 DIAGNOSIS — Z87898 Personal history of other specified conditions: Secondary | ICD-10-CM

## 2019-03-31 NOTE — Progress Notes (Signed)
PCP is Abran Richard, MD Referring Provider is Abran Richard, MD  Chief Complaint  Patient presents with  . 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  She would not need postoperative adjunctive therapy.  She is doing well with minimal post surgical pain.  Chest x-ray today shows her COPD but otherwise clear.  Sternal wires intact and well aligned.  She denies any sternal clicking or popping.  She states she is smoking less than 5 cigarettes/day.   Past Medical History:  Diagnosis Date  . Arthritis   . COPD (chronic obstructive pulmonary disease) ([place])   . Depression   . Dyspnea   . Essential hypertension, benign   . Type 2 diabetes mellitus ([place])   . Urinary incontinence     Past Surgical History:  Procedure Laterality Date  . CHOLECYSTECTOMY    . FRACTURE SURGERY Left    arm  . OPEN REDUCTION INTERNAL FIXATION (ORIF) PROXIMAL PHALANX Left 04/11/2017   Procedure: OPEN REDUCTION INTERNAL FIXATION (ORIF) PROXIMAL PHALANX;  Surgeon: Charlotte Crumb, MD;  Location: [facility];  Service: Orthopedics;  Laterality: Left;  . RESECTION OF MEDIASTINAL MASS N/A 02/18/2019   Procedure: RESECTION OF MEDIASTINAL MASS;  Surgeon: Ivin Poot, MD;  Location: [facility];  Service: Thoracic;  Laterality: N/A;  . STERNOTOMY N/A 02/18/2019   Procedure: STERNOTOMY;  Surgeon: Prescott Gum, Collier Salina, MD;  Location: [facility] OR;  Service: Thoracic;  Laterality: N/A;    Family History  Problem Relation Age of Onset  . Diabetes Mellitus II Sister   . Diabetes Mellitus II Mother   . Hypertension Brother   . Hypertension Sister     Social History Social History   Tobacco Use  . Smoking status: Current Every Day Smoker    Packs/day: 0.50    Years: 43.00    Pack years: 21.50    Types:  Cigarettes    Start date: 01/09/1971  . Smokeless tobacco: Never Used  Substance Use Topics  . Alcohol use: Yes    Alcohol/week: 0.0 standard drinks    Comment: "a fifth of liquor in less than a week but now I am only drinking about 2 beers a day"  . Drug use: No    Current Outpatient Medications  Medication Sig Dispense Refill  . albuterol (PROVENTIL HFA;VENTOLIN HFA) 108 (90 Base) MCG/ACT inhaler Inhale 2 puffs into the lungs every 4 (four) hours as needed for wheezing or shortness of breath (cough, shortness of breath or wheezing.). 1 Inhaler 3  . amLODipine (NORVASC) 10 MG tablet Take 10 mg by mouth daily.    Marland Kitchen azithromycin (ZITHROMAX Z-PAK) 250 MG tablet 2 tablets the first day then 1 tablet daily until finished 6 each 0  . citalopram (CELEXA) 20 MG tablet Take 1 tablet (20 mg total) by mouth daily. 30 tablet 0  . gabapentin (NEURONTIN) 300 MG capsule Take 600 mg by mouth at bedtime.     Marland Kitchen guaiFENesin (MUCINEX) 600 MG 12 hr tablet Take 1 tablet (600 mg total) by mouth 2 (two) times daily. 30 tablet 1  . hydrochlorothiazide (HYDRODIURIL) 25 MG tablet Take 25 mg by mouth daily.    Marland Kitchen lisinopril (PRINIVIL,ZESTRIL) 40 MG tablet Take 40 mg by mouth daily.    Marland Kitchen lovastatin (MEVACOR) 40 MG tablet Take 40 mg  by mouth at bedtime.    . metFORMIN (GLUCOPHAGE) 1000 MG tablet Take 500 mg by mouth daily with breakfast.     . metoprolol succinate (TOPROL-XL) 25 MG 24 hr tablet Take 25 mg by mouth daily.    Marland Kitchen oxybutynin (DITROPAN) 5 MG tablet Take 5 mg by mouth 3 (three) times daily.     Marland Kitchen SPIRIVA HANDIHALER 18 MCG inhalation capsule Place 1 puff into inhaler and inhale daily.  4  . traZODone (DESYREL) 150 MG tablet Take 150 mg by mouth at bedtime.   5  . Vitamin D, Ergocalciferol, (DRISDOL) 50000 units CAPS capsule Take 50,000 Units by mouth every 7 (seven) days.     No current facility-administered medications for this visit.    No Known Allergies  Review of Systems  No fever No  hemoptysis No swelling  BP 129/71 (BP Location: Right Arm, Patient Position: Sitting, Cuff Size: Normal)   Pulse 68   Temp (!) 96.3 F (35.7 C) (Skin)   Resp 20   Ht 5\' 7"  (1.702 m)   Wt 143 lb (64.9 kg)   SpO2 95% Comment: RA  BMI 22.40 kg/m  Physical Exam      Exam-surgical incision well-healed    General- alert and comfortable    Neck- no JVD, no cervical adenopathy palpable, no carotid bruit   Lungs- clear without rales, wheezes   Cor- regular rate and rhythm, no murmur , gallop   Abdomen- soft, non-tender   Extremities - warm, non-tender, minimal edema   Neuro- oriented, appropriate, no focal weakness   Diagnostic Tests: Chest x-ray personally reviewed showing COPD but otherwise no pulmonary mass or infiltrates, no pneumothorax  Impression: Excellent early recovery after 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  She can now start driving and lifting up to 10 pounds.  She is encouraged to completely stop smoking.  I will contact the patient for a follow-up virtual visit in 8 weeks to make sure she continues to progress well.  Plan: Virtual visit in 8 weeks to monitor progress.  No further scans or therapy would be needed.  Len Childs, MD [facility] [phone]

## 2019-05-26 ENCOUNTER — Telehealth: Payer: Medicare Other | Admitting: Cardiothoracic Surgery

## 2019-05-26 ENCOUNTER — Other Ambulatory Visit: Payer: Self-pay

## 2021-07-28 ENCOUNTER — Emergency Department (HOSPITAL_COMMUNITY): Payer: Medicare (Managed Care)

## 2021-07-28 ENCOUNTER — Other Ambulatory Visit: Payer: Self-pay

## 2021-07-28 ENCOUNTER — Emergency Department (HOSPITAL_COMMUNITY)
Admission: EM | Admit: 2021-07-28 | Discharge: 2021-07-28 | Disposition: A | Discharge status: Home / Self Care | Payer: Medicare (Managed Care) | Attending: Emergency Medicine | Admitting: Emergency Medicine

## 2021-07-28 ENCOUNTER — Encounter (HOSPITAL_COMMUNITY): Payer: Self-pay

## 2021-07-28 DIAGNOSIS — R519 Headache, unspecified: Secondary | ICD-10-CM | POA: Insufficient documentation

## 2021-07-28 DIAGNOSIS — J449 Chronic obstructive pulmonary disease, unspecified: Secondary | ICD-10-CM | POA: Insufficient documentation

## 2021-07-28 DIAGNOSIS — R55 Syncope and collapse: Secondary | ICD-10-CM | POA: Diagnosis present

## 2021-07-28 DIAGNOSIS — S01311A Laceration without foreign body of right ear, initial encounter: Secondary | ICD-10-CM | POA: Diagnosis not present

## 2021-07-28 DIAGNOSIS — S42002A Fracture of unspecified part of left clavicle, initial encounter for closed fracture: Secondary | ICD-10-CM | POA: Diagnosis not present

## 2021-07-28 DIAGNOSIS — I10 Essential (primary) hypertension: Secondary | ICD-10-CM | POA: Insufficient documentation

## 2021-07-28 DIAGNOSIS — Z23 Encounter for immunization: Secondary | ICD-10-CM | POA: Insufficient documentation

## 2021-07-28 DIAGNOSIS — E119 Type 2 diabetes mellitus without complications: Secondary | ICD-10-CM | POA: Diagnosis not present

## 2021-07-28 DIAGNOSIS — Z79899 Other long term (current) drug therapy: Secondary | ICD-10-CM | POA: Diagnosis not present

## 2021-07-28 DIAGNOSIS — X58XXXA Exposure to other specified factors, initial encounter: Secondary | ICD-10-CM | POA: Insufficient documentation

## 2021-07-28 DIAGNOSIS — Z7984 Long term (current) use of oral hypoglycemic drugs: Secondary | ICD-10-CM | POA: Diagnosis not present

## 2021-07-28 LAB — CBC WITH DIFFERENTIAL/PLATELET
Abs Immature Granulocytes: 0.04 10*3/uL (ref 0.00–0.07)
Basophils Absolute: 0.1 10*3/uL (ref 0.0–0.1)
Basophils Relative: 1 %
Eosinophils Absolute: 0.2 10*3/uL (ref 0.0–0.5)
Eosinophils Relative: 2 %
HCT: 47.5 % — ABNORMAL HIGH (ref 36.0–46.0)
Hemoglobin: 15.8 g/dL — ABNORMAL HIGH (ref 12.0–15.0)
Immature Granulocytes: 0 %
Lymphocytes Relative: 28 %
Lymphs Abs: 2.8 10*3/uL (ref 0.7–4.0)
MCH: 32.4 pg (ref 26.0–34.0)
MCHC: 33.3 g/dL (ref 30.0–36.0)
MCV: 97.5 fL (ref 80.0–100.0)
Monocytes Absolute: 0.8 10*3/uL (ref 0.1–1.0)
Monocytes Relative: 8 %
Neutro Abs: 6.1 10*3/uL (ref 1.7–7.7)
Neutrophils Relative %: 61 %
Platelets: 271 10*3/uL (ref 150–400)
RBC: 4.87 MIL/uL (ref 3.87–5.11)
RDW: 14.9 % (ref 11.5–15.5)
WBC: 10 10*3/uL (ref 4.0–10.5)
nRBC: 0 % (ref 0.0–0.2)

## 2021-07-28 LAB — COMPREHENSIVE METABOLIC PANEL
ALT: 10 U/L (ref 0–44)
AST: 17 U/L (ref 15–41)
Albumin: 3.5 g/dL (ref 3.5–5.0)
Alkaline Phosphatase: 88 U/L (ref 38–126)
Anion gap: 10 (ref 5–15)
BUN: 17 mg/dL (ref 8–23)
CO2: 33 mmol/L — ABNORMAL HIGH (ref 22–32)
Calcium: 9.2 mg/dL (ref 8.9–10.3)
Chloride: 95 mmol/L — ABNORMAL LOW (ref 98–111)
Creatinine, Ser: 0.91 mg/dL (ref 0.44–1.00)
GFR, Estimated: >60 mL/min (ref >60)
Glucose, Bld: 116 mg/dL — ABNORMAL HIGH (ref 70–99)
Potassium: 3.1 mmol/L — ABNORMAL LOW (ref 3.5–5.1)
Sodium: 138 mmol/L (ref 135–145)
Total Bilirubin: 0.6 mg/dL (ref 0.3–1.2)
Total Protein: 7.3 g/dL (ref 6.5–8.1)

## 2021-07-28 MED ORDER — LIDOCAINE HCL (PF) 1 % IJ SOLN
5.0000 mL | Freq: Once | INTRAMUSCULAR | Status: AC
  Administered 2021-07-28: 5 mL
  Filled 2021-07-28: qty 5

## 2021-07-28 MED ORDER — TETANUS-DIPHTH-ACELL PERTUSSIS 5-2.5-18.5 LF-MCG/0.5 IM SUSY
0.5000 mL | PREFILLED_SYRINGE | Freq: Once | INTRAMUSCULAR | Status: AC
Start: 2021-07-28 — End: 2021-07-28
  Administered 2021-07-28: 0.5 mL via INTRAMUSCULAR
  Filled 2021-07-28: qty 0.5

## 2021-07-28 NOTE — ED Provider Notes (Signed)
?[place] ?Provider Note ? ? ?CSN: [id number] ?Arrival date & time: 07/28/21  1111 ? ?  ? ?History ? ?Chief Complaint  ?Patient presents with  ? Laceration  ? ? ?Holly May is a 71 y.o. female. ? ? ?Laceration ? ?Patient with medical history notable for COPD, type 2 diabetes, hypertension, hyperlipidemia, mediastinal mass status post tenotomy, alcohol use disorder presents today due to syncope and ear laceration.  Patient states she drinks copious amounts of alcohol, unable to categorize how much.  She states she has episodes where she loses consciousness, this been happening for some time and there is been no identifiable cause per the patient.  She does remember what happened last night, states she must of "blacked out" and she woke up on the kitchen floor.  Has laceration to her right ear, unsure when her tetanus was last updated.  She denies any pain in her extremities, states she was ambulatory without difficulty.  Denies any chest pain or shortness of breath, denies feeling dizzy.  States she has a headache but denies any vomiting or vision changes.  Not on blood thinners. ? ?Home Medications ?Prior to Admission medications   ?Medication Sig Start Date End Date Taking? Authorizing Provider  ?albuterol (PROVENTIL HFA;VENTOLIN HFA) 108 (90 Base) MCG/ACT inhaler Inhale 2 puffs into the lungs every 4 (four) hours as needed for wheezing or shortness of breath (cough, shortness of breath or wheezing.). 02/18/18  Yes Shah, Pratik D, DO  ?amLODipine (NORVASC) 5 MG tablet Take 5 mg by mouth daily.   Yes [provider]  ?citalopram (CELEXA) 20 MG tablet Take 1 tablet (20 mg total) by mouth daily. 06/14/16  Yes Johnson, Clanford L, MD  ?gabapentin (NEURONTIN) 300 MG capsule Take 600 mg by mouth at bedtime.     [provider]  ?hydrochlorothiazide (HYDRODIURIL) 25 MG tablet Take 25 mg by mouth daily.    [provider]  ?lisinopril (PRINIVIL,ZESTRIL) 40 MG tablet Take  40 mg by mouth daily.    [provider]  ?lovastatin (MEVACOR) 40 MG tablet Take 40 mg by mouth at bedtime.    [provider]  ?metFORMIN (GLUCOPHAGE) 1000 MG tablet Take 500 mg by mouth daily with breakfast.     [provider]  ?metoprolol succinate (TOPROL-XL) 25 MG 24 hr tablet Take 25 mg by mouth daily.    [provider]  ?oxybutynin (DITROPAN) 5 MG tablet Take 5 mg by mouth 3 (three) times daily.     [provider]  ?SPIRIVA HANDIHALER 18 MCG inhalation capsule Place 1 puff into inhaler and inhale daily. 01/24/18   [provider]  ?traZODone (DESYREL) 150 MG tablet Take 150 mg by mouth at bedtime.  01/27/18   [provider]  ?Vitamin D, Ergocalciferol, (DRISDOL) 50000 units CAPS capsule Take 50,000 Units by mouth every 7 (seven) days.    [provider]  ?   ? ?Allergies    ?Patient has no known allergies.   ? ?Review of Systems   ?Review of Systems ? ?Physical Exam ?Updated Vital Signs ?BP (!) 163/83   Pulse 72   Temp 97.6 ?F (36.4 ?C)   Resp 17   Ht '5\' 7"'$  (1.702 m)   Wt 63.5 kg   SpO2 97%   BMI 21.93 kg/m?  ?Physical Exam ?Vitals and nursing note reviewed. Exam conducted with a chaperone present.  ?Constitutional:   ?   Appearance: Normal appearance.  ?HENT:  ?   Head:  Normocephalic.  ?   Comments: No periorbital ecchymosis ?   Ears:  ?   Comments: No hemotympanums.  Right ear with laceration as seen in picture below. ?   Nose: No rhinorrhea.  ?   Mouth/Throat:  ?   Comments: Edentulous.   ?Eyes:  ?   General: No scleral icterus.    ?   Right eye: No discharge.     ?   Left eye: No discharge.  ?   Extraocular Movements: Extraocular movements intact.  ?   Pupils: Pupils are equal, round, and reactive to light.  ?Cardiovascular:  ?   Rate and Rhythm: Normal rate. Rhythm irregular.  ?   Pulses: Normal pulses.  ?   Heart sounds: Normal heart sounds. No murmur heard. ?  No friction rub. No gallop.  ?   Comments: Patient is having  PACs on the cardiac monitor.  Regular rhythm, sternotomy scar ?Pulmonary:  ?   Effort: Pulmonary effort is normal. No respiratory distress.  ?   Breath sounds: Normal breath sounds.  ?Abdominal:  ?   General: Abdomen is flat. Bowel sounds are normal. There is no distension.  ?   Palpations: Abdomen is soft.  ?   Tenderness: There is no abdominal tenderness.  ?   Comments: Abdomen soft and nontender  ?Musculoskeletal:  ?   Comments: Able to raise upper and lower extremities but does endorse pain with raising the left upper extremity.  ?Skin: ?   General: Skin is warm and dry.  ?   Capillary Refill: Capillary refill takes less than 2 seconds.  ?   Coloration: Skin is not jaundiced.  ?Neurological:  ?   Mental Status: She is alert. Mental status is at baseline.  ?   Coordination: Coordination normal.  ?   Comments: Cranial nerves III through XII grossly intact, grip strength equal bilaterally.  Able to raise both lower extremities 5/5 strength.  ? ? ? ?ED Results / Procedures / Treatments   ?Labs ?(all labs ordered are listed, but only abnormal results are displayed) ?Labs Reviewed  ?CBC WITH DIFFERENTIAL/PLATELET - Abnormal; Notable for the following components:  ?    Result Value  ? Hemoglobin 15.8 (*)   ? HCT 47.5 (*)   ? All other components within normal limits  ?COMPREHENSIVE METABOLIC PANEL - Abnormal; Notable for the following components:  ? Potassium 3.1 (*)   ? Chloride 95 (*)   ? CO2 33 (*)   ? Glucose, Bld 116 (*)   ? All other components within normal limits  ? ? ?EKG ?None ? ?Radiology ?CT Head Wo Contrast ? ?Result Date: 07/28/2021 ?CLINICAL DATA:  Syncope with right ear laceration. EXAM: CT HEAD WITHOUT CONTRAST CT CERVICAL SPINE WITHOUT CONTRAST TECHNIQUE: Multidetector CT imaging of the head and cervical spine was performed following the standard protocol without intravenous contrast. Multiplanar CT image reconstructions of the cervical spine were also generated. RADIATION DOSE REDUCTION: This exam was  performed according to the departmental dose-optimization program which includes automated exposure control, adjustment of the mA and/or kV according to patient size and/or use of iterative reconstruction technique. COMPARISON:  Head CT 04/17/2018 FINDINGS: CT HEAD FINDINGS Brain: There is no evidence for acute hemorrhage, hydrocephalus, mass lesion, or abnormal extra-axial fluid collection. No definite CT evidence for acute infarction. Patchy low attenuation in the deep hemispheric and periventricular white matter is nonspecific, but likely reflects chronic microvascular ischemic demyelination. Vascular: No hyperdense vessel or unexpected calcification. Skull: No evidence for  fracture. No worrisome lytic or sclerotic lesion. Sinuses/Orbits: The visualized paranasal sinuses and mastoid air cells are clear. Visualized portions of the globes and intraorbital fat are unremarkable. Other: None. CT CERVICAL SPINE FINDINGS Alignment: Normal. Skull base and vertebrae: No acute fracture. No primary bone lesion or focal pathologic process. Soft tissues and spinal canal: No prevertebral fluid or swelling. No visible canal hematoma. Disc levels: Intervertebral disc spaces are preserved. The facets are well aligned. Upper chest: Unremarkable. Other: None. IMPRESSION: 1. No acute intracranial abnormality. 2. Chronic small vessel ischemic disease. 3. No evidence for cervical spine fracture or subluxation. Electronically Signed   By: Misty Stanley M.D.   On: 07/28/2021 13:02  ? ?CT Cervical Spine Wo Contrast ? ?Result Date: 07/28/2021 ?CLINICAL DATA:  Syncope with right ear laceration. EXAM: CT HEAD WITHOUT CONTRAST CT CERVICAL SPINE WITHOUT CONTRAST TECHNIQUE: Multidetector CT imaging of the head and cervical spine was performed following the standard protocol without intravenous contrast. Multiplanar CT image reconstructions of the cervical spine were also generated. RADIATION DOSE REDUCTION: This exam was performed according to  the departmental dose-optimization program which includes automated exposure control, adjustment of the mA and/or kV according to patient size and/or use of iterative reconstruction technique. COMPARISON:

## 2021-07-28 NOTE — ED Notes (Signed)
Noted patient to brady down while in triage to heart rate in 30's. ?

## 2021-07-28 NOTE — Discharge Instructions (Signed)
1-have the sutures removed from your ear in 5 to 7 days.  Can you do your family medicine doctor's office or at the ED or urgent care. ?2-x-rays showed you have a broken clavicle, wear the sling and swath for comfort as needed.  Follow-up with orthopedic for repeat imaging information provided above.  You can take Tylenol for pain. ? ?

## 2021-07-28 NOTE — ED Triage Notes (Signed)
Patient here to have laceration to right ear evaluated. Noted to be torn. States that she was in kitchen and blacked out. Reports this has happened several times in the past and she has been worked up with no underlying cause identified. Bleeding controlled at this time.  ?

## 2021-08-13 ENCOUNTER — Encounter: Payer: Self-pay | Admitting: Orthopedic Surgery

## 2021-08-13 ENCOUNTER — Ambulatory Visit (INDEPENDENT_AMBULATORY_CARE_PROVIDER_SITE_OTHER): Payer: Medicare (Managed Care)

## 2021-08-13 ENCOUNTER — Ambulatory Visit: Payer: Medicare (Managed Care) | Admitting: Orthopedic Surgery

## 2021-08-13 VITALS — BP 160/94 | HR 93 | Ht 67.0 in | Wt 133.2 lb

## 2021-08-13 DIAGNOSIS — M25512 Pain in left shoulder: Secondary | ICD-10-CM | POA: Diagnosis not present

## 2021-08-13 NOTE — Patient Instructions (Signed)
I do not have any specific restrictions for you limit your activities by what is painful. ? ?We will repeat your x-rays in July ?

## 2021-08-13 NOTE — Progress Notes (Signed)
Chief Complaint  ?Patient presents with  ? Shoulder Injury  ?  Left, fractured clavicle fe ll around 07/27/21  ? ? ?HPI: This is a 71 year old female presents after being seen in the emergency room for a fall around 28 April this year complains of mild pain left shoulder over the left collarbone.  She has a history of COPD essential hypertension type 2 diabetes has a history of syncope as well ? ?Does not remember injuring the left shoulder.  However on presentation to the ER she had a laceration to her left earlobe images of the left shoulder showed a clavicle fracture indeterminate age ? ?Past Medical History:  ?Diagnosis Date  ? Arthritis   ? COPD (chronic obstructive pulmonary disease) ([place])   ? Depression   ? Dyspnea   ? Essential hypertension, benign   ? Type 2 diabetes mellitus ([place])   ? Urinary incontinence   ? ? ?BP (!) 160/94   Pulse 93   Ht '5\' 7"'$  (1.702 m)   Wt 133 lb 3.2 oz (60.4 kg)   BMI 20.86 kg/m?  ? ? ?General appearance: Well-developed well-nourished no gross deformities ? ?Cardiovascular normal pulse and perfusion normal color without edema ? ?Neurologically no sensation loss or deficits or pathologic reflexes ? ?Psychological: Awake alert and oriented x3 mood and affect normal ? ?Skin no lacerations or ulcerations no nodularity no palpable masses, no erythema or nodularity ? ?Musculoskeletal: There is no bruising or ecchymosis over the collarbone there is some tenderness there she can move her left shoulder normally ? ? ? ?Imaging outside image.  I interpret this as a collarbone fracture recommend dedicated clavicle x-rays ? ?A/P ? ?Does not seem to be having any major discomfort from the fracture ? ?Recommend no restrictions on range of motion limit activities by pain ? ?Return for x-ray 2 months ? ?Due to the conspicuous nature of this fracture we will just do office visits ?

## 2021-10-08 ENCOUNTER — Ambulatory Visit: Payer: Medicare (Managed Care) | Admitting: Orthopedic Surgery

## 2021-10-08 ENCOUNTER — Encounter: Payer: Self-pay | Admitting: Orthopedic Surgery

## 2022-01-07 ENCOUNTER — Other Ambulatory Visit (HOSPITAL_COMMUNITY): Payer: Self-pay | Admitting: Internal Medicine

## 2022-01-07 DIAGNOSIS — R937 Abnormal findings on diagnostic imaging of other parts of musculoskeletal system: Secondary | ICD-10-CM

## 2022-01-29 ENCOUNTER — Encounter (HOSPITAL_COMMUNITY): Payer: Self-pay | Admitting: Internal Medicine

## 2022-01-30 ENCOUNTER — Other Ambulatory Visit (HOSPITAL_COMMUNITY): Payer: Self-pay | Admitting: Internal Medicine

## 2022-01-30 DIAGNOSIS — R918 Other nonspecific abnormal finding of lung field: Secondary | ICD-10-CM

## 2022-02-07 ENCOUNTER — Ambulatory Visit (HOSPITAL_COMMUNITY)
Admission: RE | Admit: 2022-02-07 | Discharge: 2022-02-07 | Disposition: A | Discharge status: Home / Self Care | Payer: Medicare Other | Source: Ambulatory Visit | Attending: Internal Medicine | Admitting: Internal Medicine

## 2022-02-07 DIAGNOSIS — R918 Other nonspecific abnormal finding of lung field: Secondary | ICD-10-CM | POA: Insufficient documentation

## 2022-02-07 MED ORDER — IOHEXOL 300 MG/ML  SOLN
75.0000 mL | Freq: Once | INTRAMUSCULAR | Status: AC | PRN
  Administered 2022-02-07: 75 mL via INTRAVENOUS

## 2022-02-12 LAB — POCT I-STAT CREATININE: Creatinine, Ser: 1 mg/dL (ref 0.44–1.00)

## 2022-02-14 ENCOUNTER — Other Ambulatory Visit (HOSPITAL_COMMUNITY): Payer: Self-pay | Admitting: Internal Medicine

## 2022-02-14 DIAGNOSIS — N2889 Other specified disorders of kidney and ureter: Secondary | ICD-10-CM

## 2022-02-27 ENCOUNTER — Institutional Professional Consult (permissible substitution): Payer: Medicare (Managed Care) | Admitting: Pulmonary Disease

## 2022-03-06 ENCOUNTER — Ambulatory Visit: Payer: Medicare Other | Admitting: Orthopaedic Surgery

## 2022-03-06 ENCOUNTER — Encounter: Payer: Self-pay | Admitting: Orthopaedic Surgery

## 2022-03-06 VITALS — BP 120/86 | HR 58 | Ht 67.0 in | Wt 137.0 lb

## 2022-03-06 DIAGNOSIS — M546 Pain in thoracic spine: Secondary | ICD-10-CM | POA: Diagnosis not present

## 2022-03-06 DIAGNOSIS — R918 Other nonspecific abnormal finding of lung field: Secondary | ICD-10-CM

## 2022-03-06 DIAGNOSIS — M4854XA Collapsed vertebra, not elsewhere classified, thoracic region, initial encounter for fracture: Secondary | ICD-10-CM | POA: Diagnosis not present

## 2022-03-06 MED ORDER — HYDROCODONE-ACETAMINOPHEN 5-325 MG PO TABS: ORAL_TABLET | ORAL | 0 refills | Status: DC

## 2022-03-06 NOTE — Patient Instructions (Signed)
Central Scheduling [phone]  While we are working on your approval please go ahead and call to schedule your appointment to be done within one week. If you can not get an appointment at [facility] within the next week, ask if they have something sooner at [facility] or [place] if you are able to go to [place] to have the imaging done.  AFTER you have made your imaging appointment, please call our office back at [phone] to schedule an appointment to review your results.  CURRENTLY [employer] ONLY AUTHORIZES IMAGING TO BE PERFORMED AT [facility]. THEY WILL NOT APPROVE ANY OTHER FACILITY  [facility] Address: [address], [place], [place] [address] Phone: [phone]   [facility] [address], [place] [address] Phone: [phone]  Dr.Keeling is here all day on Tuesdays, Wednesday mornings, and Thursday mornings. If you need anything such as a medication refill, please either call BEFORE the end of the day on [facility] or send a message through [place]. Your pharmacy can send a refill request for you. Calling by the end of the day on [facility] allows [place] time to send Dr.Keeling the request and for him to respond before he leaves on Thursdays.  If Dr. Luna Glasgow is out of the office, we may send it to one of the other providers and they may not refill it for the same amount that your original prescription is for.

## 2022-03-06 NOTE — Progress Notes (Signed)
My back hurts.  She has had back pain for eight to nine months in the thoracic area.  She said she fell right after Christmas last year and the pain got better but then worse and has been bothering her since then.  She has no weakness.  She has no numbness.  She tolerated the pain.    She has been seen by Abran Richard in [place] for this.  A CT scan of the chest was done on 02-07-22 showing: IMPRESSION: 1. Increased size and new solid component of a spiculated left upper lobe 11 x 8 mm nodule associated with peripheral lucency/cavitation and tethering of the adjacent fissure. This is suspicious for primary lung malignancy. Recommend subspecialty consultation to pulmonology or thoracic surgery. 2. New indeterminate 10 x 6 mm right lower lobe nodule and 2 mm lingula nodule. 3. Indeterminate 2.1 cm exophytic right upper pole renal lesion. Recommend renal protocol MRI with and without contrast for further evaluation. 4. New compression deformities of T9 and T11. Unchanged T8 and L1 compression deformities. 5. 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 No residual or recurrent soft tissue mass in the anterior mediastinum. 6. Similar mucous plugging in the bronchus intermedius extending into the lingula and right lower lobe associated with bronchial wall thickening. Slightly increased left lower lobe bronchiectasis. 7. Coronary calcifications.  Aortic Atherosclerosis (ICD10-I70.0).  She was told of the findings and has an appointment to see Dr. Melvyn Novas for this on January 5 for the lung lesion.  I have reviewed the notes from [facility] and the CT.  I have independently reviewed and interpreted x-rays of this patient done at another site by another physician or qualified health professional.  I have explained the seriousness of the findings.  I checked her oxygenation and it was 98%. She is breathing well.  She has thoracic pain more at the mid thoracic level and no kyphosis  or deformity.  NV intact. Back exam otherwise negative.    Encounter Diagnoses  Name Primary?   Mass of lung Yes   Pain in thoracic spine    Compression fracture of thoracic spine, non-traumatic, initial encounter ([place])    I am concerned about the fractures of the thoracic spine being pathological.  I have spent some time talking to her about this and the seriousness of the  CT report.  I asked if she wanted to be seen earlier for the lung lesion and she said no.  I will get MRI with and without contrast of the thoracic spine.  I will call in pain medicine.  I have reviewed the [place] web site prior to prescribing narcotic medicine for this patient.  Return in two weeks or earlier.  Call if any problem.  Precautions discussed.  Electronically Signed Sanjuana Kava, MD 12/6/202310:56 AM

## 2022-03-07 ENCOUNTER — Ambulatory Visit (HOSPITAL_COMMUNITY)
Admission: RE | Admit: 2022-03-07 | Discharge: 2022-03-07 | Disposition: A | Discharge status: Home / Self Care | Payer: Medicare Other | Source: Ambulatory Visit | Attending: Internal Medicine | Admitting: Internal Medicine

## 2022-03-07 ENCOUNTER — Ambulatory Visit (HOSPITAL_COMMUNITY)
Admission: RE | Admit: 2022-03-07 | Discharge: 2022-03-07 | Disposition: A | Discharge status: Home / Self Care | Payer: Medicare Other | Source: Ambulatory Visit | Attending: Orthopaedic Surgery | Admitting: Orthopaedic Surgery

## 2022-03-07 DIAGNOSIS — M546 Pain in thoracic spine: Secondary | ICD-10-CM | POA: Diagnosis present

## 2022-03-07 DIAGNOSIS — N2889 Other specified disorders of kidney and ureter: Secondary | ICD-10-CM | POA: Diagnosis present

## 2022-03-07 DIAGNOSIS — R918 Other nonspecific abnormal finding of lung field: Secondary | ICD-10-CM | POA: Insufficient documentation

## 2022-03-07 MED ORDER — GADOBUTROL 1 MMOL/ML IV SOLN
7.0000 mL | Freq: Once | INTRAVENOUS | Status: AC | PRN
  Administered 2022-03-07: 7 mL via INTRAVENOUS

## 2022-03-12 ENCOUNTER — Ambulatory Visit: Payer: Medicare Other | Admitting: Orthopaedic Surgery

## 2022-03-12 ENCOUNTER — Encounter: Payer: Self-pay | Admitting: Orthopaedic Surgery

## 2022-03-12 VITALS — BP 170/108 | HR 73 | Ht 67.0 in | Wt 137.0 lb

## 2022-03-12 DIAGNOSIS — S22080A Wedge compression fracture of T11-T12 vertebra, initial encounter for closed fracture: Secondary | ICD-10-CM

## 2022-03-12 MED ORDER — HYDROCODONE-ACETAMINOPHEN 7.5-325 MG PO TABS: ORAL_TABLET | ORAL | 0 refills | Status: DC

## 2022-03-12 NOTE — Progress Notes (Signed)
My back is sore  She has pain in the lower thoracic area.  MRI was done and showed: IMPRESSION: 1. Old healed compression deformities throughout the thoracic region as outlined above. 2. Subacute superior endplate fracture at W26 with loss of height of 25% and mild edema and enhancement indicating that this is not completely healed. This could be painful. 3. No evidence of metastatic disease in the region. 4. Facet arthritis at T8-9 and T9-10 with mild edema and enhancement which could relate to regional back pain.  I have explained the findings to her.  I have independently reviewed the MRI.    I will increase her pain medicine.  I have reviewed the [place] web site prior to prescribing narcotic medicine for this patient.  I do not feel she needs a brace at this time.  Her back is tender in the lower thoracic area and upper lumbar area but no spasm.  ROM is decreased. Muscle tone and strength normal.  NV intact.  Encounter Diagnosis  Name Primary?   Compression fracture of T11 vertebra, initial encounter ([place]) Yes   Return in one month.  X-rays then.  Call if any problem.  Precautions discussed.  Electronically Signed Sanjuana Kava, MD 12/12/202310:35 AM

## 2022-03-12 NOTE — Patient Instructions (Addendum)
Do not double up your pain medication. Dr.Keeling has increased your pain medication.  Do not bend over as this will cause increased pain. If you are going on a long car ride, you may need to stop and take a break to walk around.   If you've been sitting for a while take your time getting up.

## 2022-03-18 ENCOUNTER — Institutional Professional Consult (permissible substitution): Payer: Medicare (Managed Care) | Admitting: Internal Medicine

## 2022-04-04 ENCOUNTER — Telehealth: Payer: Self-pay | Admitting: Orthopaedic Surgery

## 2022-04-05 ENCOUNTER — Ambulatory Visit (INDEPENDENT_AMBULATORY_CARE_PROVIDER_SITE_OTHER): Payer: Medicare HMO | Admitting: Internal Medicine

## 2022-04-05 ENCOUNTER — Encounter: Payer: Self-pay | Admitting: Internal Medicine

## 2022-04-05 VITALS — BP 128/82 | HR 64 | Temp 98.4°F | Ht 67.0 in | Wt 134.4 lb

## 2022-04-05 DIAGNOSIS — J449 Chronic obstructive pulmonary disease, unspecified: Secondary | ICD-10-CM | POA: Diagnosis not present

## 2022-04-05 DIAGNOSIS — I10 Essential (primary) hypertension: Secondary | ICD-10-CM

## 2022-04-05 DIAGNOSIS — F17210 Nicotine dependence, cigarettes, uncomplicated: Secondary | ICD-10-CM | POA: Diagnosis not present

## 2022-04-05 DIAGNOSIS — R918 Other nonspecific abnormal finding of lung field: Secondary | ICD-10-CM

## 2022-04-05 DIAGNOSIS — R0609 Other forms of dyspnea: Secondary | ICD-10-CM | POA: Diagnosis not present

## 2022-04-05 MED ORDER — LEVOFLOXACIN 500 MG PO TABS: 500.0000 mg | ORAL_TABLET | Freq: Every day | ORAL | 0 refills | Status: DC

## 2022-04-05 MED ORDER — OLMESARTAN MEDOXOMIL 40 MG PO TABS: 40.0000 mg | ORAL_TABLET | Freq: Every day | ORAL | 11 refills | Status: DC

## 2022-04-05 NOTE — Patient Instructions (Addendum)
We will walk you today to see if you qualify for 0xygen   Start Trelegy 143 one click each  For cough / congestion > mucinex 1200 mg every 12 hours as needed   Levaquin 500 mg one daily x 10 days   CT chest needs to be done after Feb 3rd 2025   The key is to stop smoking completely before smoking completely stops you!  When you start you new month of medications need to substitute olmesartan 40 mg for the lisinopril 40 mg daily   Please schedule a follow up office visit in 4-5  weeks, sooner if needed  with all medications /inhalers/ solutions in hand so we can verify exactly what you are taking. This includes all medications from all doctors and over the counters

## 2022-04-05 NOTE — Progress Notes (Unsigned)
Holly May, female    DOB: April 11, 1950    MRN: [id number]   Brief patient profile:  73   yobf  active smoker  referred to pulmonary clinic in [place]  04/05/2022 by Abran Richard  for variable doe ? Copd ? AB  x  2017    History of Present Illness  04/05/2022  Pulmonary/ 1st office eval/ Holly May /  Office on acei and DPI  Chief Complaint  Patient presents with   Consult    SOB   Dyspnea:  mb and back 200 each way level and short of breath and sometimes has to stop  Cough: clear mucus variably producttive esp p lie down sometimes wakes her up p asleep/ flat 2 pillows  SABA use: 3 x weekly  02: none     Past Medical History:  Diagnosis Date   Arthritis    COPD (chronic obstructive pulmonary disease) ([place])    Depression    Dyspnea    Essential hypertension, benign    Type 2 diabetes mellitus ([facility])    Urinary incontinence     Outpatient Medications Prior to Visit  Medication Sig Dispense Refill   albuterol (PROVENTIL HFA;VENTOLIN HFA) 108 (90 Base) MCG/ACT inhaler Inhale 2 puffs into the lungs every 4 (four) hours as needed for wheezing or shortness of breath (cough, shortness of breath or wheezing.). 1 Inhaler 3   amLODipine (NORVASC) 5 MG tablet Take 5 mg by mouth daily.     BREO ELLIPTA 100-25 MCG/ACT AEPB Inhale 1 puff into the lungs daily.     citalopram (CELEXA) 20 MG tablet Take 1 tablet (20 mg total) by mouth daily. 30 tablet 0   gabapentin (NEURONTIN) 300 MG capsule Take 300 mg by mouth at bedtime.     hydrochlorothiazide (HYDRODIURIL) 25 MG tablet Take 25 mg by mouth daily.     HYDROcodone-acetaminophen (NORCO) 7.5-325 MG tablet One every six hours as needed for pain.  14 day limit 56 tablet 0   lisinopril (PRINIVIL,ZESTRIL) 40 MG tablet Take 40 mg by mouth daily.     lovastatin (MEVACOR) 40 MG tablet Take 40 mg by mouth at bedtime.     oxybutynin (DITROPAN) 5 MG tablet Take 5 mg by mouth 3 (three) times daily.      No facility-administered medications  prior to visit.     Objective:     BP 128/82   Pulse 64   Temp 98.4 F (36.9 C)   Ht '5\' 7"'$  (1.702 m)   Wt 134 lb 6.4 oz (61 kg)   SpO2 96% Comment: ra  BMI 21.05 kg/m   SpO2: 96 % (ra)  Amb bf > stated age/ congested sounding cough   HEENT : Oropharynx  clear/ edentulous      NECK :  without  apparent JVD/ palpable Nodes/TM    LUNGS: no acc muscle use,  Mild barrel  contour chest wall with bilateral  Distant bs s audible wheeze and  without cough on insp or exp maneuvers  and mild  Hyperresonant  to  percussion bilaterally     CV:  RRR  no s3 or murmur or increase in P2, and no edema   ABD:  soft and nontender with pos end  insp Hoover's  in the supine position.  No bruits or organomegaly appreciated   MS:  Nl gait/ ext warm without deformities Or obvious joint restrictions  calf tenderness, cyanosis or clubbing     SKIN: warm and dry without lesions  NEURO:  alert, approp, nl sensorium with  no motor or cerebellar deficits apparent.     I personally reviewed images and agree with radiology impression as follows:   Chest CT  02/07/22    with contrast 1. Increased size and new solid component of a spiculated left upper lobe 11 x 8 mm nodule associated with peripheral lucency/cavitation and tethering of the adjacent fissure. This is suspicious for primary lung malignancy.   2. New indeterminate 10 x 6 mm right lower lobe nodule and 2 mm lingula nodule. 3. Indeterminate 2.1 cm exophytic right upper pole renal lesion. Recommend renal protocol MRI with and without contrast for further evaluation. 4. New compression deformities of T9 and T11. Unchanged T8 and L1 compression deformities. 5. 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 No residual or recurrent soft tissue mass in the anterior mediastinum. 6. Similar mucous plugging in the bronchus intermedius extending into the lingula and right lower lobe associated with bronchial wall thickening. Slightly  increased left lower lobe bronchiectasis. 7. Coronary calcifications.  Aortic Atherosclerosis (ICD10-I70.0).        Assessment   No problem-specific Assessment & Plan notes found for this encounter.     Christinia Gully, MD 04/05/2022

## 2022-04-06 DIAGNOSIS — R918 Other nonspecific abnormal finding of lung field: Secondary | ICD-10-CM | POA: Insufficient documentation

## 2022-04-06 NOTE — Assessment & Plan Note (Signed)
Trial off ACEi 04/05/2022 for chronic cough   ACE inhibitors are problematic in  pts with airway complaints because  even experienced pulmonologists can't always distinguish ace effects from copd/asthma.  By themselves they don't actually cause a problem, much like oxygen can't by itself start a fire, but they certainly serve as a powerful catalyst or enhancer for any "fire"  or inflammatory process in the upper airway, be it caused by an ET  tube or more commonly reflux (especially in the obese or pts with known GERD or who are on biphoshonates).    In the era of ARB near equivalency until we have a better handle on the reversibility of the airway problem, it just makes sense to avoid ACEI  entirely in the short run and then decide later, having established a level of airway control using a reasonable limited regimen, whether to add back ace but even then being very careful to observe the pt for worsening airway control and number of meds used/ needed to control symptoms.   >>> try olmesartan  40 mg daily in place of lisinopril 40 mg   Each maintenance medication was reviewed in detail including emphasizing most importantly the difference between maintenance and prns and under what circumstances the prns are to be triggered using an action plan format where appropriate.  Total time for H and P, chart review, counseling, reviewing dpi/hfa device(s) , directly observing portions of ambulatory 02 saturation study/ and generating customized AVS unique to this office visit / same day charting  > 60 min with pt new to me

## 2022-04-06 NOTE — Assessment & Plan Note (Addendum)
Active smoker - PFT's  02/16/2019  FEV1 0.87 (39 % ) ratio 0.39  p 7 % improvement from saba p ? prior to study with DLCO  10.88 (49%)   and FV curve classically concave  - 04/05/2022  After extensive coaching inhaler device,  effectiveness =    75% with dpi > trelegy trial  - d/c ACE 04/05/2022  - 04/05/2022   Walked on RA  x  3  lap(s) =  approx 450  ft  @ mod pace, stopped due to end of stud with lowest 02 sats 95% no sob     Group D (now reclassified as E) in terms of symptom/risk and laba/lama/ICS  therefore appropriate rx at this point >>>  trelegy and approp saba while w/u for potential lung surgery for SPN (see separate a/p)

## 2022-04-06 NOTE — Assessment & Plan Note (Signed)
Counseled re importance of smoking cessation but did not meet time criteria for separate billing   °

## 2022-04-06 NOTE — Assessment & Plan Note (Signed)
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 / active somking and any of these could be malignant but most are likely inflammatory with mixed solid components, the most significant in the LUL   PET scanning can be very unreliable in this setting with false pos and false negs a concern so rec  Levquin 500 mg daily x 10 days / mucinex then repeat CT chest at the 3 month mark from previous studies and consider navigational bx of any lesions that appear to be growing at that time (may recover atypical tb from lesion which would help with abx choice in future)   Discussed in detail all the  indications, usual  risks and alternatives  relative to the benefits with patient who agrees to proceed with w/u as outlined.

## 2022-04-16 ENCOUNTER — Encounter: Payer: Self-pay | Admitting: Orthopaedic Surgery

## 2022-04-16 ENCOUNTER — Ambulatory Visit (INDEPENDENT_AMBULATORY_CARE_PROVIDER_SITE_OTHER): Payer: Medicare HMO

## 2022-04-16 ENCOUNTER — Ambulatory Visit: Payer: Medicare HMO | Admitting: Orthopaedic Surgery

## 2022-04-16 ENCOUNTER — Ambulatory Visit: Payer: Self-pay

## 2022-04-16 DIAGNOSIS — S22080A Wedge compression fracture of T11-T12 vertebra, initial encounter for closed fracture: Secondary | ICD-10-CM

## 2022-04-16 DIAGNOSIS — S22080D Wedge compression fracture of T11-T12 vertebra, subsequent encounter for fracture with routine healing: Secondary | ICD-10-CM | POA: Diagnosis not present

## 2022-04-16 DIAGNOSIS — K219 Gastro-esophageal reflux disease without esophagitis: Secondary | ICD-10-CM | POA: Insufficient documentation

## 2022-04-16 MED ORDER — OXYCODONE-ACETAMINOPHEN 5-325 MG PO TABS: ORAL_TABLET | ORAL | 0 refills | Status: DC

## 2022-04-16 NOTE — Progress Notes (Signed)
My back still hurts.  She wants a stronger pain medicine as her back hurts all the time.  She has no new trauma.  X-rays were done of the thoracic spine, reported separately.  NV intact.  No weakness.  Encounter Diagnosis  Name Primary?   Compression fracture of T11 vertebra with routine healing, subsequent encounter Yes   I will increase pain medicine to Percocet 5.  I have reviewed the [place] web site prior to prescribing narcotic medicine for this patient.  Return in one month.  Call if any problem.  Precautions discussed.  Electronically Signed Sanjuana Kava, MD 1/16/20248:51 AM

## 2022-04-16 NOTE — Patient Instructions (Signed)
YOU HAVE BEEN PRESCRIBED A NARCOTIC/PAIN MEDICATION. DO NOT DRIVE OR OPERATE HEAVY MACHINERY WHILE USING THIS MEDICATION. PAIN MEDICATION ALSO CAUSES CONSTIPATION. YOU CAN USE A STOOL SOFTENER OR MIRALAX TO HELP WITH THIS.   Dr.Keeling is here all day on Tuesdays. He is here half a day on Wednesday mornings, and Thursday mornings. If you need anything such as a medication refill, please either call BEFORE the end of the day on [facility] or send a message through [place]. Your pharmacy can send a refill request for you. Calling by the end of the day on [facility] allows [place] time to send Dr.Keeling the request and for him to respond before he leaves on Thursdays.

## 2022-05-06 ENCOUNTER — Telehealth: Payer: Self-pay | Admitting: Orthopaedic Surgery

## 2022-05-06 ENCOUNTER — Other Ambulatory Visit: Payer: Self-pay | Admitting: Orthopaedic Surgery

## 2022-05-06 NOTE — Telephone Encounter (Signed)
Patient lvm requesting a refill on her Hydrocodone 7.5-325 and she stated that he didn't send in her inhaler with the brown top, she still has the blue top, but needs the brown top.  [place]

## 2022-05-07 ENCOUNTER — Telehealth: Payer: Self-pay | Admitting: Internal Medicine

## 2022-05-07 ENCOUNTER — Ambulatory Visit (HOSPITAL_COMMUNITY)
Admission: RE | Admit: 2022-05-07 | Discharge: 2022-05-07 | Disposition: A | Discharge status: Home / Self Care | Payer: Medicare HMO | Source: Ambulatory Visit | Attending: Internal Medicine | Admitting: Internal Medicine

## 2022-05-07 DIAGNOSIS — R911 Solitary pulmonary nodule: Secondary | ICD-10-CM

## 2022-05-07 DIAGNOSIS — R0609 Other forms of dyspnea: Secondary | ICD-10-CM

## 2022-05-07 DIAGNOSIS — R918 Other nonspecific abnormal finding of lung field: Secondary | ICD-10-CM

## 2022-05-07 NOTE — Telephone Encounter (Signed)
Received call report on CT Chest dated 05/07/22   Impression:  IMPRESSION: 1. Enlarging spiculated nodule in the LEFT upper lobe concerning for bronchogenic carcinoma. Recommend tissue sampling and consider FDG PET scan to evaluate extent of disease. 2. Improvement peribronchial thickening and bronchiectasis in the RIGHT lower lobe. 3. No mediastinal lymphadenopathy.   These results will be called to the ordering clinician or representative by the Radiologist Assistant, and communication documented in the PACS or [employer].     Electronically Signed   By: Suzy Bouchard M.D.   On: 05/07/2022 16:00  Routing to Dr Melvyn Novas urgent

## 2022-05-07 NOTE — Telephone Encounter (Signed)
See result note.  

## 2022-05-08 NOTE — Telephone Encounter (Signed)
Called and notified patient.  PET scan order placed.

## 2022-05-08 NOTE — Addendum Note (Signed)
Addended by: Fritzi Mandes D on: 05/08/2022 08:11 AM   Modules accepted: Orders

## 2022-05-14 ENCOUNTER — Ambulatory Visit (INDEPENDENT_AMBULATORY_CARE_PROVIDER_SITE_OTHER): Payer: Medicare HMO

## 2022-05-14 ENCOUNTER — Ambulatory Visit: Payer: Medicare HMO | Admitting: Orthopaedic Surgery

## 2022-05-14 ENCOUNTER — Encounter: Payer: Self-pay | Admitting: Orthopaedic Surgery

## 2022-05-14 VITALS — Ht 67.0 in | Wt 134.0 lb

## 2022-05-14 DIAGNOSIS — S22080D Wedge compression fracture of T11-T12 vertebra, subsequent encounter for fracture with routine healing: Secondary | ICD-10-CM | POA: Diagnosis not present

## 2022-05-14 NOTE — Progress Notes (Signed)
I feel better.  She has much less pain of the thoracic spine.  She has no new trauma.  NV intact.  X-rays were done of the thoracic spine, reported separately.  Encounter Diagnosis  Name Primary?   Compression fracture of T11 vertebra with routine healing, subsequent encounter Yes   Return in one month, X-rays then.  Call if any problem.  Precautions discussed.  Electronically Signed Sanjuana Kava, MD 2/13/20249:06 AM

## 2022-05-16 ENCOUNTER — Ambulatory Visit (INDEPENDENT_AMBULATORY_CARE_PROVIDER_SITE_OTHER): Payer: Medicare HMO | Admitting: Internal Medicine

## 2022-05-16 ENCOUNTER — Ambulatory Visit (HOSPITAL_COMMUNITY)
Admission: RE | Admit: 2022-05-16 | Discharge: 2022-05-16 | Disposition: A | Discharge status: Home / Self Care | Payer: Medicare HMO | Source: Ambulatory Visit | Attending: Internal Medicine | Admitting: Internal Medicine

## 2022-05-16 ENCOUNTER — Encounter: Payer: Self-pay | Admitting: Internal Medicine

## 2022-05-16 VITALS — BP 136/68 | HR 84 | Ht 67.0 in | Wt 135.8 lb

## 2022-05-16 DIAGNOSIS — R918 Other nonspecific abnormal finding of lung field: Secondary | ICD-10-CM

## 2022-05-16 DIAGNOSIS — R911 Solitary pulmonary nodule: Secondary | ICD-10-CM | POA: Diagnosis present

## 2022-05-16 DIAGNOSIS — J449 Chronic obstructive pulmonary disease, unspecified: Secondary | ICD-10-CM

## 2022-05-16 DIAGNOSIS — F17210 Nicotine dependence, cigarettes, uncomplicated: Secondary | ICD-10-CM

## 2022-05-16 MED ORDER — FLUDEOXYGLUCOSE F - 18 (FDG) INJECTION
7.1400 | Freq: Once | INTRAVENOUS | Status: AC | PRN
  Administered 2022-05-16: 7.14 via INTRAVENOUS

## 2022-05-16 NOTE — Patient Instructions (Signed)
I will call the results of the PET scan and we'll go from there  Please schedule a follow up visit in 3 months but call sooner if needed

## 2022-05-16 NOTE — Assessment & Plan Note (Signed)
Active smoker - PFT's  02/16/2019  FEV1 0.87 (39 % ) ratio 0.39  p 7 % improvement from saba p ? prior to study with DLCO  10.88 (49%)   and FV curve classically concave  - 04/05/2022  After extensive coaching inhaler device,  effectiveness =    75% with dpi > trelegy trial  - d/c ACE 04/05/2022  - 04/05/2022   Walked on RA  x  3  lap(s) =  approx 450  ft  @ mod pace, stopped due to end of stud with lowest 02 sats 95% no sob         Group D (now reclassified as E) in terms of symptom/risk and laba/lama/ICS  therefore appropriate rx at this point >>>  trelegy and approp saba   Re SABA :  I spent extra time with pt today reviewing appropriate use of albuterol for prn use on exertion with the following points: 1) saba is for relief of sob that does not improve by walking a slower pace or resting but rather if the pt does not improve after trying this first. 2) If the pt is convinced, as many are, that saba helps recover from activity faster then it's easy to tell if this is the case by re-challenging : ie stop, take the inhaler, then p 5 minutes try the exact same activity (intensity of workload) that just caused the symptoms and see if they are substantially diminished or not after saba 3) if there is an activity that reproducibly causes the symptoms, try the saba 15 min before the activity on alternate days   If in fact the saba really does help, then fine to continue to use it prn but advised may need to look closer at the maintenance regimen being used to achieve better control of airways disease with exertion.

## 2022-05-16 NOTE — Assessment & Plan Note (Addendum)
Counseled re importance of smoking cessation but did not meet time criteria for separate billing          Medical decision making was a high level of complexity in this case   requiring extra time for  H and P, chart review, counseling,  and generating customized AVS unique to this office visit and charting.   Each maintenance medication was reviewed in detail including emphasizing most importantly the difference between maintenance and prns and under what circumstances the prns are to be triggered using an action plan format where appropriate. Please see avs for details which were reviewed in writing by both me and my nurse and patient given a written copy highlighted where appropriate with yellow highlighter for the patient's continued care at home along with an updated version of their medications.  Patient was asked to maintain medication reconciliation by comparing this list to the actual medications being used at home and to contact this office right away if there is a conflict or discrepancy.

## 2022-05-16 NOTE — Progress Notes (Signed)
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    History of Present Illness  04/05/2022  Pulmonary/ 1st office eval/ Estevan Kersh / [place] on acei and DPI  Chief Complaint  Patient presents with   Consult    SOB   Dyspnea:  mb and back 210f  each way level and so short of breath  sometimes has to stop but varies s pattern  Cough: clear mucus variably producttive esp p lie down sometimes wakes her up p asleep/ flat 2 pillows  SABA use: 3 x weekly  02: none Rec We will walk you today to see if you qualify for 0xygen  Start Trelegy [id number] click each For cough / congestion > mucinex 1200 mg every 12 hours as needed  Levaquin 500 mg one daily x 10 days  CT chest needs to be done after Feb 3rd 2024  The key is to stop smoking completely before smoking completely stops you! When you start you new month of medications need to substitute olmesartan 40 mg for the lisinopril 40 mg daily   Please schedule a follow up office visit in 4-5  weeks, sooner if needed  with all medications /inhalers/ solutions in hand so we can verify exactly what you are taking. This includes all medications from all doctors and over the counters         05/16/2022  f/u ov/[place] [facility]/Zilpha Mcandrew re: GOLD 3/ still smoking  maint on Trelegy 100 one each am  / did not bring meds  Chief Complaint  Patient presents with   Follow-up    Doing well since last ov    Dyspnea:  easier to push buggy at [place] [employer] than prior to trelegy rx  Cough: variable slt grey esp am / no blood  Sleeping: none  SABA use: did not bring as rec  02: none  Covid status: says all     No obvious day to day or daytime variability or assoc excess/  purulent sputum or mucus plugs or hemoptysis or cp or chest tightness, subjective wheeze or overt sinus or hb symptoms.   Sleeping  without nocturnal  or early am exacerbation  of respiratory  c/o's or need for noct saba. Also denies any obvious fluctuation of symptoms with weather or environmental changes or other aggravating or alleviating factors except as outlined above   No unusual exposure hx or h/o childhood pna/ asthma or knowledge of premature birth.  Current Allergies, Complete Past Medical History, Past Surgical History, Family History, and Social History were reviewed in [employer].  ROS  The following are not active complaints unless bolded Hoarseness, sore throat, dysphagia, dental problems, itching, sneezing,  nasal congestion or discharge of excess mucus or purulent secretions, ear ache,   fever, chills, sweats, unintended wt loss or wt gain, classically pleuritic or exertional cp,  orthopnea pnd or arm/hand swelling  or leg swelling, presyncope, palpitations, abdominal pain, anorexia, nausea, vomiting, diarrhea  or change in bowel habits or change in bladder habits, change in stools or change in urine, dysuria, hematuria,  rash, arthralgias, visual  complaints, headache, numbness, weakness or ataxia or problems with walking or coordination,  change in mood or  memory.        Current Meds  Medication Sig   albuterol (PROVENTIL HFA;VENTOLIN HFA) 108 (90 Base) MCG/ACT inhaler Inhale 2 puffs into the lungs every 4 (four) hours as needed for wheezing or shortness of breath (cough, shortness of breath or wheezing.).   amLODipine (NORVASC) 5 MG tablet Take 5 mg by mouth daily.   citalopram (CELEXA) 20 MG tablet Take 1 tablet (20 mg total) by mouth daily.   gabapentin (NEURONTIN) 300 MG capsule Take 300 mg by mouth at bedtime.   hydrochlorothiazide (HYDRODIURIL) 25 MG tablet Take 25 mg by mouth daily.   HYDROcodone-acetaminophen (NORCO) 7.5-325 MG tablet TAKE  ONE TABLET BY MOUTH EVERY 6 HOURS AS NEEDED FOR PAIN   lovastatin (MEVACOR) 40 MG tablet Take 40 mg by mouth at bedtime.   olmesartan (BENICAR) 40 MG tablet Take 1 tablet (40 mg total) by mouth daily.   oxybutynin (DITROPAN) 5 MG tablet Take 5 mg by mouth 3 (three) times daily.    oxyCODONE-acetaminophen (PERCOCET/ROXICET) 5-325 MG tablet TAKE ONE TABLET BY MOUTH EVERY 6 HOURS FOR PAIN                      Past Medical History:  Diagnosis Date   Arthritis    COPD (chronic obstructive pulmonary disease) (HCC)    Depression    Dyspnea    Essential hypertension, benign    Type 2 diabetes mellitus (HCC)    Urinary incontinence        Objective:     Wt Readings from Last 3 Encounters:  05/16/22 135 lb 12.8 oz (61.6 kg)  05/14/22 134 lb (60.8 kg)  04/05/22 134 lb 6.4 oz (61 kg)      Vital signs reviewed  05/16/2022  - Note at rest 02 sats  96% on RA   General appearance:    amb bf/ difficult to understand speech = baseline       HEENT : Oropharynx  clear / edentulous      NECK :  without  apparent JVD/ palpable Nodes/TM    LUNGS: no acc muscle use,  Mild barrel  contour chest wall with bilateral  Distant bs s audible wheeze and  without cough on insp or exp maneuvers  and mild  Hyperresonant  to  percussion bilaterally     CV:  RRR  no s3 or murmur or increase in P2, and no edema   ABD:  soft and nontender with pos end  insp Hoover's  in the supine position.  No bruits or organomegaly appreciated   MS:  Nl gait/ ext warm without deformities Or obvious joint restrictions  calf tenderness, cyanosis or clubbing     SKIN: warm and dry without lesions    NEURO:  alert, approp, nl sensorium with  no motor or cerebellar deficits apparent.    I personally reviewed images and agree with radiology impression as follows:   Chest CT  02/07/22    with contrast 1. Increased size and new solid component of a spiculated left upper lobe 11 x 8 mm nodule associated with peripheral  lucency/cavitation and tethering of the adjacent fissure.   2. New indeterminate 10 x 6 mm right lower lobe nodule and 2 mm lingula nodule. 3. Indeterminate 2.1 cm exophytic right upper pole renal lesion. 4. New compression deformities of T9 and T11. Unchanged T8 and  L1 compression deformities. 5. 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 No residual or recurrent soft tissue mass in the anterior mediastinum. 6. Similar mucous plugging in the bronchus intermedius extending into the lingula and right lower lobe associated with bronchial wall thickening. Slightly increased left lower lobe bronchiectasis. 7. Coronary calcifications.  Aortic Atherosclerosis (ICD10-I70.0).        Assessment

## 2022-05-17 NOTE — Assessment & Plan Note (Signed)
See CT chest 02/07/22  -    mucus plugging and bronchiectasis, esp LLL with baseline CT 01/20/19 and interval progression exp LUL with solid component  - CT chest 05/07/2022  1. Enlarging spiculated nodule in the LEFT upper lobe concerning for bronchogenic carcinoma. Recommend tissue sampling and consider FDG PET scan to evaluate extent of disease. 2. Improvement peribronchial thickening and bronchiectasis in the RIGHT lower lobe. - PET 05/16/2022  Pos @  11 mm no pos nodes > rec consider excisional bx vs RULobectomy vs FOB with SRT > needs pfts first   Reviewed scans with pt and informed her more likely than not this is lung ca in a curable stage so prefer surgery though prior pfts are precluding so rec repeat and then decide which option to pursue  Discussed in detail all the  indications, usual  risks and alternatives  relative to the benefits with patient who agrees to proceed with w/u as outlined.

## 2022-05-23 ENCOUNTER — Telehealth: Payer: Self-pay | Admitting: Orthopaedic Surgery

## 2022-05-31 ENCOUNTER — Other Ambulatory Visit: Payer: Self-pay

## 2022-05-31 DIAGNOSIS — J449 Chronic obstructive pulmonary disease, unspecified: Secondary | ICD-10-CM

## 2022-06-03 ENCOUNTER — Ambulatory Visit (INDEPENDENT_AMBULATORY_CARE_PROVIDER_SITE_OTHER): Payer: Medicare HMO | Admitting: Internal Medicine

## 2022-06-03 DIAGNOSIS — J449 Chronic obstructive pulmonary disease, unspecified: Secondary | ICD-10-CM | POA: Diagnosis not present

## 2022-06-03 NOTE — Patient Instructions (Signed)
Performed Arlyce Harman Pre and Post, DLCO.

## 2022-06-03 NOTE — Progress Notes (Signed)
Performed Arlyce Harman Pre and Post, DLCO.

## 2022-06-06 ENCOUNTER — Telehealth: Payer: Self-pay | Admitting: Orthopaedic Surgery

## 2022-06-06 LAB — PULMONARY FUNCTION TEST
DL/VA % pred: 74 %
DL/VA: 3.04 ml/min/mmHg/L
DLCO cor % pred: 49 %
DLCO cor: 10.31 ml/min/mmHg
DLCO unc % pred: 49 %
DLCO unc: 10.31 ml/min/mmHg
FEF 25-75 Post: 0.27 L/sec
FEF 25-75 Pre: 0.46 L/sec
FEF2575-%Change-Post: -39 %
FEF2575-%Pred-Post: 13 %
FEF2575-%Pred-Pre: 22 %
FEV1-%Change-Post: -14 %
FEV1-%Pred-Post: 32 %
FEV1-%Pred-Pre: 38 %
FEV1-Post: 0.82 L
FEV1-Pre: 0.95 L
FEV1FVC-%Change-Post: -7 %
FEV1FVC-%Pred-Pre: 70 %
FEV6-%Change-Post: -7 %
FEV6-%Pred-Post: 52 %
FEV6-%Pred-Pre: 56 %
FEV6-Post: 1.64 L
FEV6-Pre: 1.77 L
FEV6FVC-%Change-Post: 0 %
FEV6FVC-%Pred-Post: 104 %
FEV6FVC-%Pred-Pre: 104 %
FVC-%Change-Post: -7 %
FVC-%Pred-Post: 49 %
FVC-%Pred-Pre: 54 %
FVC-Post: 1.64 L
Post FEV1/FVC ratio: 50 %
Post FEV6/FVC ratio: 100 %
Pre FEV1/FVC ratio: 54 %
Pre FEV6/FVC Ratio: 100 %

## 2022-06-10 ENCOUNTER — Telehealth: Payer: Self-pay | Admitting: Internal Medicine

## 2022-06-10 NOTE — Progress Notes (Signed)
Spoke with pt and notified of results per Dr. Wert. Pt verbalized understanding and denied any questions. 

## 2022-06-10 NOTE — Telephone Encounter (Signed)
Pt. Call to get results from PFT she has lung cancer and wanted to know next steps

## 2022-06-10 NOTE — Telephone Encounter (Signed)
Would not recommend removing lung tissue given pfts best next step is to do a limited bx > refer to Dr Windell [place]

## 2022-06-10 NOTE — Telephone Encounter (Signed)
Dr. Melvyn Novas please advise, PFT results are available can we add patient to schedule for sooner appt than May?

## 2022-06-10 NOTE — Telephone Encounter (Signed)
ATC patient.  Left detailed vm on answering machine (ok per dpr) with recs from Dr. Melvyn Novas and asked patient to call back to have appt with Dr. Valeta Harms scheduled.

## 2022-06-12 ENCOUNTER — Ambulatory Visit (INDEPENDENT_AMBULATORY_CARE_PROVIDER_SITE_OTHER): Payer: Medicare HMO | Admitting: Orthopaedic Surgery

## 2022-06-12 ENCOUNTER — Encounter: Payer: Self-pay | Admitting: Orthopaedic Surgery

## 2022-06-12 ENCOUNTER — Ambulatory Visit (INDEPENDENT_AMBULATORY_CARE_PROVIDER_SITE_OTHER): Payer: Medicare HMO

## 2022-06-12 VITALS — BP 188/116 | HR 90

## 2022-06-12 DIAGNOSIS — S22080D Wedge compression fracture of T11-T12 vertebra, subsequent encounter for fracture with routine healing: Secondary | ICD-10-CM

## 2022-06-12 NOTE — Progress Notes (Signed)
My back is hurting at times.  She has back pain depending on activity and the weather.  I told her I will be stopping the narcotics soon.   I have decreased them.  NV intact.  X-rays were done of the spine, reported separately.  Encounter Diagnosis  Name Primary?   Compression fracture of T11 vertebra with routine healing, subsequent encounter Yes   Return in one month.  X-rays then.  Call if any problem.  Precautions discussed.  Electronically Signed Sanjuana Kava, MD 3/13/20249:18 AM

## 2022-06-20 ENCOUNTER — Telehealth: Payer: Self-pay | Admitting: Orthopaedic Surgery

## 2022-06-20 ENCOUNTER — Encounter: Payer: Self-pay | Admitting: Pulmonary Disease

## 2022-06-20 ENCOUNTER — Ambulatory Visit (INDEPENDENT_AMBULATORY_CARE_PROVIDER_SITE_OTHER): Payer: Medicare HMO | Admitting: Pulmonary Disease

## 2022-06-20 VITALS — BP 110/70 | HR 84 | Temp 98.2°F | Ht 67.0 in | Wt 143.2 lb

## 2022-06-20 DIAGNOSIS — R911 Solitary pulmonary nodule: Secondary | ICD-10-CM | POA: Diagnosis not present

## 2022-06-20 DIAGNOSIS — Z720 Tobacco use: Secondary | ICD-10-CM

## 2022-06-20 DIAGNOSIS — R942 Abnormal results of pulmonary function studies: Secondary | ICD-10-CM | POA: Diagnosis not present

## 2022-06-20 MED ORDER — PREDNISONE 10 MG PO TABS: ORAL_TABLET | ORAL | 0 refills | Status: DC

## 2022-06-20 NOTE — Addendum Note (Signed)
Addended by: Alvin Critchley on: 06/20/2022 11:41 AM   Modules accepted: Orders

## 2022-06-20 NOTE — Progress Notes (Signed)
She states she had her flu vaccine and her covid booster at the pharmacy in 2023.

## 2022-06-20 NOTE — Progress Notes (Signed)
Synopsis: Referred in March 2024, pulmonary nodule by Abran Richard, MD  Subjective:   PATIENT ID: Holly May GENDER: female DOB: 27-Sep-1950, MRN: [id number]  Chief Complaint  Patient presents with   New Patient (Initial Visit)    Results of PET and CT scan.  Sent by Dr. Melvyn Novas.    This is a 72 year old female, past medical history of COPD, hypertension, type 2 diabetes.  She is current every day smoker.She is followed by Dr. Melvyn Novas one of my pulmonary partners.  She had a nuclear medicine PET scan completed on 05/16/2022 which revealed a spiculated 11 mm left upper lobe pulmonary nodule that was hypermetabolic concerning for a primary bronchogenic carcinoma.  Patient was referred for consideration for bronchoscopy and biopsy.Patient had pulmonary function test completed on 06/03/2022 which revealed a FEV1/FVC ratio of 50, FEV1 of 32% predicted, DLCO 49% predicted.    Past Medical History:  Diagnosis Date   Arthritis    COPD (chronic obstructive pulmonary disease) ([place])    Depression    Dyspnea    Essential hypertension, benign    Type 2 diabetes mellitus ([place])    Urinary incontinence      Family History  Problem Relation Age of Onset   Diabetes Mellitus II Sister    Diabetes Mellitus II Mother    Hypertension Brother    Hypertension Sister      Past Surgical History:  Procedure Laterality Date   CHOLECYSTECTOMY     FRACTURE SURGERY Left    arm   OPEN REDUCTION INTERNAL FIXATION (ORIF) PROXIMAL PHALANX Left 04/11/2017   Procedure: OPEN REDUCTION INTERNAL FIXATION (ORIF) PROXIMAL PHALANX;  Surgeon: Charlotte Crumb, MD;  Location: [facility];  Service: Orthopedics;  Laterality: Left;   RESECTION OF MEDIASTINAL MASS N/A 02/18/2019   Procedure: RESECTION OF MEDIASTINAL MASS;  Surgeon: Ivin Poot, MD;  Location: [facility];  Service: Thoracic;  Laterality: N/A;   STERNOTOMY N/A 02/18/2019   Procedure: STERNOTOMY;  Surgeon: Ivin Poot, MD;  Location: [facility]  OR;  Service: Thoracic;  Laterality: N/A;    Social History   Socioeconomic History   Marital status: Widowed    Spouse name: Not on file   Number of children: Not on file   Years of education: Not on file   Highest education level: Not on file  Occupational History   Occupation: retired  Tobacco Use   Smoking status: Every Day    Packs/day: 1.50    Years: 50.00    Additional pack years: 0.00    Total pack years: 75.00    Types: Cigarettes    Start date: 01/09/1971   Smokeless tobacco: Never   Tobacco comments:    Smoking 1 ppd.  Trying to quit.  06/20/2022 hfb  Vaping Use   Vaping Use: Never used  Substance and Sexual Activity   Alcohol use: Yes    Alcohol/week: 0.0 standard drinks of alcohol    Comment: "a fifth of liquor in less than a week but now I am only drinking about 2 beers a day"   Drug use: No   Sexual activity: Not on file  Other Topics Concern   Not on file  Social History Narrative   Not on file   Social Determinants of Health   Financial Resource Strain: Not on file  Food Insecurity: Not on file  Transportation Needs: Not on file  Physical Activity: Not on file  Stress: Not on file  Social Connections: Not on file  Intimate Partner Violence: Not on file     No Known Allergies   Outpatient Medications Prior to Visit  Medication Sig Dispense Refill   albuterol (PROVENTIL HFA;VENTOLIN HFA) 108 (90 Base) MCG/ACT inhaler Inhale 2 puffs into the lungs every 4 (four) hours as needed for wheezing or shortness of breath (cough, shortness of breath or wheezing.). 1 Inhaler 3   amLODipine (NORVASC) 5 MG tablet Take 5 mg by mouth daily.     citalopram (CELEXA) 20 MG tablet Take 1 tablet (20 mg total) by mouth daily. 30 tablet 0   gabapentin (NEURONTIN) 300 MG capsule Take 300 mg by mouth at bedtime.     hydrochlorothiazide (HYDRODIURIL) 25 MG tablet Take 25 mg by mouth daily.     ipratropium-albuterol (DUONEB) 0.5-2.5 (3) MG/3ML SOLN Take 3 mLs by  nebulization every 6 (six) hours as needed.     lovastatin (MEVACOR) 40 MG tablet Take 40 mg by mouth at bedtime.     olmesartan (BENICAR) 40 MG tablet Take 1 tablet (40 mg total) by mouth daily. 30 tablet 11   oxybutynin (DITROPAN) 5 MG tablet Take 5 mg by mouth 3 (three) times daily.      oxyCODONE (OXY IR/ROXICODONE) 5 MG immediate release tablet TAKE ONE TABLET BY MOUTH EVERY 6 HOURS AS NEEDED FOR UP TO 14 DAYS. 20 tablet 0   predniSONE (DELTASONE) 20 MG tablet 3 tablets x 3 days; then 2 tablets x 3 days, then 1 tablet x 3 days Orally As directed for 9 days     TRELEGY ELLIPTA 100-62.5-25 MCG/ACT AEPB Inhale 1 puff into the lungs daily.     No facility-administered medications prior to visit.    Review of Systems  Constitutional:  Negative for chills, fever, malaise/fatigue and weight loss.  HENT:  Negative for hearing loss, sore throat and tinnitus.   Eyes:  Negative for blurred vision and double vision.  Respiratory:  Positive for cough, sputum production and shortness of breath. Negative for hemoptysis, wheezing and stridor.   Cardiovascular:  Negative for chest pain, palpitations, orthopnea, leg swelling and PND.  Gastrointestinal:  Negative for abdominal pain, constipation, diarrhea, heartburn, nausea and vomiting.  Genitourinary:  Negative for dysuria, hematuria and urgency.  Musculoskeletal:  Negative for joint pain and myalgias.  Skin:  Negative for itching and rash.  Neurological:  Negative for dizziness, tingling, weakness and headaches.  Endo/Heme/Allergies:  Negative for environmental allergies. Does not bruise/bleed easily.  Psychiatric/Behavioral:  Negative for depression. The patient is not nervous/anxious and does not have insomnia.   All other systems reviewed and are negative.    Objective:  Physical Exam Vitals reviewed.  Constitutional:      General: She is not in acute distress.    Appearance: She is well-developed.  HENT:     Head: Normocephalic and  atraumatic.  Eyes:     General: No scleral icterus.    Conjunctiva/sclera: Conjunctivae normal.     Pupils: Pupils are equal, round, and reactive to light.  Neck:     Vascular: No JVD.     Trachea: No tracheal deviation.  Cardiovascular:     Rate and Rhythm: Normal rate and regular rhythm.     Heart sounds: Normal heart sounds. No murmur heard. Pulmonary:     Effort: Pulmonary effort is normal. No tachypnea, accessory muscle usage or respiratory distress.     Breath sounds: No stridor. Wheezing present. No rhonchi or rales.  Abdominal:     General: There is no distension.  Palpations: Abdomen is soft.     Tenderness: There is no abdominal tenderness.  Musculoskeletal:        General: No tenderness.     Cervical back: Neck supple.  Lymphadenopathy:     Cervical: No cervical adenopathy.  Skin:    General: Skin is warm and dry.     Capillary Refill: Capillary refill takes less than 2 seconds.     Findings: No rash.  Neurological:     Mental Status: She is alert and oriented to person, place, and time.  Psychiatric:        Behavior: Behavior normal.      Vitals:   06/20/22 1012  BP: 110/70  Pulse: 84  Temp: 98.2 F (36.8 C)  TempSrc: Oral  SpO2: 96%  Weight: 143 lb 3.2 oz (65 kg)  Height: 5\' 7"  (1.702 m)   96% on RA BMI Readings from Last 3 Encounters:  06/20/22 22.43 kg/m  05/16/22 21.27 kg/m  05/14/22 20.99 kg/m   Wt Readings from Last 3 Encounters:  06/20/22 143 lb 3.2 oz (65 kg)  05/16/22 135 lb 12.8 oz (61.6 kg)  05/14/22 134 lb (60.8 kg)     CBC    Component Value Date/Time   WBC 10.0 07/28/2021 1147   RBC 4.87 07/28/2021 1147   HGB 15.8 (H) 07/28/2021 1147   HCT 47.5 (H) 07/28/2021 1147   PLT 271 07/28/2021 1147   MCV 97.5 07/28/2021 1147   MCH 32.4 07/28/2021 1147   MCHC 33.3 07/28/2021 1147   RDW 14.9 07/28/2021 1147   LYMPHSABS 2.8 07/28/2021 1147   MONOABS 0.8 07/28/2021 1147   EOSABS 0.2 07/28/2021 1147   BASOSABS 0.1 07/28/2021  1147      Chest Imaging:  Nuclear medicine pet imaging March [id number]: Hypermetabolic 11 mm pulmonary nodule concerning for primary bronchogenic carcinoma. The patient's images have been independently reviewed by me.    Pulmonary Functions Testing Results:    Latest Ref Rng & Units 06/03/2022   11:20 AM 02/16/2019    3:23 PM  PFT Results  FVC-Pre L  1.37   FVC-Predicted Pre % 54  48   FVC-Post L 1.64  1.87   FVC-Predicted Post % 49  65   Pre FEV1/FVC % % 54  59   Post FEV1/FCV % % 50  46   FEV1-Pre L 0.95  0.81   FEV1-Predicted Pre % 38  36   FEV1-Post L 0.82  0.87   DLCO uncorrected ml/min/mmHg 10.31  10.88   DLCO UNC% % 49  49   DLCO corrected ml/min/mmHg 10.31  10.81   DLCO COR %Predicted % 49  49   DLVA Predicted % 74  86   TLC L  8.36   TLC % Predicted %  151   RV % Predicted %  299     FeNO:   Pathology:   Echocardiogram:   Heart Catheterization:     Assessment & Plan:     ICD-10-CM   1. Pulmonary nodule  R91.1     2. Abnormal PET of left lung  R94.2     3. Tobacco use  Z72.0       Discussion:  This is a 72 year old female longstanding history of tobacco use.  Currently using Trelegy Ellipta for her COPD management.  She is still smoking a pack per day.  She has severe COPD at baseline.  Not felt to be a candidate for surgical evaluation due to lung function.  She has  a hypermetabolic left upper lobe pulmonary nodule concerning for primary bronchogenic carcinoma.  Plan: Patient is agreeable for robotic assisted navigational bronchoscopy. Today in the office we talked about the risk benefits alternatives of consideration for bronchoscopy and biopsy. Patient is agreeable to proceed. We talked about the risk of bleeding and pneumothorax. Tentative bronchoscopy date will be on 07/09/2022. We appreciate the PCC's help with scheduling.    Current Outpatient Medications:    albuterol (PROVENTIL HFA;VENTOLIN HFA) 108 (90 Base) MCG/ACT inhaler, Inhale 2  puffs into the lungs every 4 (four) hours as needed for wheezing or shortness of breath (cough, shortness of breath or wheezing.)., Disp: 1 Inhaler, Rfl: 3   amLODipine (NORVASC) 5 MG tablet, Take 5 mg by mouth daily., Disp: , Rfl:    citalopram (CELEXA) 20 MG tablet, Take 1 tablet (20 mg total) by mouth daily., Disp: 30 tablet, Rfl: 0   gabapentin (NEURONTIN) 300 MG capsule, Take 300 mg by mouth at bedtime., Disp: , Rfl:    hydrochlorothiazide (HYDRODIURIL) 25 MG tablet, Take 25 mg by mouth daily., Disp: , Rfl:    ipratropium-albuterol (DUONEB) 0.5-2.5 (3) MG/3ML SOLN, Take 3 mLs by nebulization every 6 (six) hours as needed., Disp: , Rfl:    lovastatin (MEVACOR) 40 MG tablet, Take 40 mg by mouth at bedtime., Disp: , Rfl:    olmesartan (BENICAR) 40 MG tablet, Take 1 tablet (40 mg total) by mouth daily., Disp: 30 tablet, Rfl: 11   oxybutynin (DITROPAN) 5 MG tablet, Take 5 mg by mouth 3 (three) times daily. , Disp: , Rfl:    oxyCODONE (OXY IR/ROXICODONE) 5 MG immediate release tablet, TAKE ONE TABLET BY MOUTH EVERY 6 HOURS AS NEEDED FOR UP TO 14 DAYS., Disp: 20 tablet, Rfl: 0   predniSONE (DELTASONE) 20 MG tablet, 3 tablets x 3 days; then 2 tablets x 3 days, then 1 tablet x 3 days Orally As directed for 9 days, Disp: , Rfl:    predniSONE (DELTASONE) 10 MG tablet, Take 4 tabs by mouth once daily x4 days, then 3 tabs x4 days, 2 tabs x4 days, 1 tab x4 days and stop., Disp: 40 tablet, Rfl: 0   TRELEGY ELLIPTA 100-62.5-25 MCG/ACT AEPB, Inhale 1 puff into the lungs daily., Disp: , Rfl:   I spent 63 minutes dedicated to the care of this patient on the date of this encounter to include pre-visit review of records, face-to-face time with the patient discussing conditions above, post visit ordering of testing, clinical documentation with the electronic health record, making appropriate referrals as documented, and communicating necessary findings to members of the patients care team.   Garner Nash,  DO [facility] 06/20/2022 10:27 AM

## 2022-06-20 NOTE — H&P (View-Only) (Signed)
 Synopsis: Referred in March 2024, pulmonary nodule by Hunter, Denise, MD  Subjective:   PATIENT ID: Holly May GENDER: female DOB: 08/11/1950, MRN: [id number]  Chief Complaint  Patient presents with   New Patient (Initial Visit)    Results of PET and CT scan.  Sent by Dr. Wert.    This is a 71-year-old female, past medical history of COPD, hypertension, type 2 diabetes.  She is current every day smoker.She is followed by Dr. Wert one of my pulmonary partners.  She had a nuclear medicine PET scan completed on 05/16/2022 which revealed a spiculated 11 mm left upper lobe pulmonary nodule that was hypermetabolic concerning for a primary bronchogenic carcinoma.  Patient was referred for consideration for bronchoscopy and biopsy.Patient had pulmonary function test completed on 06/03/2022 which revealed a FEV1/FVC ratio of 50, FEV1 of 32% predicted, DLCO 49% predicted.    Past Medical History:  Diagnosis Date   Arthritis    COPD (chronic obstructive pulmonary disease) ([facility])    Depression    Dyspnea    Essential hypertension, benign    Type 2 diabetes mellitus ([facility])    Urinary incontinence      Family History  Problem Relation Age of Onset   Diabetes Mellitus II Sister    Diabetes Mellitus II Mother    Hypertension Brother    Hypertension Sister      Past Surgical History:  Procedure Laterality Date   CHOLECYSTECTOMY     FRACTURE SURGERY Left    arm   OPEN REDUCTION INTERNAL FIXATION (ORIF) PROXIMAL PHALANX Left 04/11/2017   Procedure: OPEN REDUCTION INTERNAL FIXATION (ORIF) PROXIMAL PHALANX;  Surgeon: Weingold, Matthew, MD;  [facility];  Service: Orthopedics;  Laterality: Left;   RESECTION OF MEDIASTINAL MASS N/A 02/18/2019   Procedure: RESECTION OF MEDIASTINAL MASS;  Surgeon: Van Trigt, Peter, MD;  Location[facility];  Service: Thoracic;  Laterality: N/A;   STERNOTOMY N/A 02/18/2019   Procedure: STERNOTOMY;  Surgeon: Van Trigt, Peter, MD;  Location: [facility];  Service: Thoracic;  Laterality: N/A;    Social History   Socioeconomic History   Marital status: Widowed    Spouse name: Not on file   Number of children: Not on file   Years of education: Not on file   Highest education level: Not on file  Occupational History   Occupation: retired  Tobacco Use   Smoking status: Every Day    Packs/day: 1.50    Years: 50.00    Additional pack years: 0.00    Total pack years: 75.00    Types: Cigarettes    Start date: 01/09/1971   Smokeless tobacco: Never   Tobacco comments:    Smoking 1 ppd.  Trying to quit.  06/20/2022 hfb  Vaping Use   Vaping Use: Never used  Substance and Sexual Activity   Alcohol use: Yes    Alcohol/week: 0.0 standard drinks of alcohol    Comment: "a fifth of liquor in less than a week but now I am only drinking about 2 beers a day"   Drug use: No   Sexual activity: Not on file  Other Topics Concern   Not on file  Social History Narrative   Not on file   Social Determinants of Health   Financial Resource Strain: Not on file  Food Insecurity: Not on file  Transportation Needs: Not on file  Physical Activity: Not on file  Stress: Not on file  Social Connections: Not on file    Intimate Partner Violence: Not on file     No Known Allergies   Outpatient Medications Prior to Visit  Medication Sig Dispense Refill   albuterol (PROVENTIL HFA;VENTOLIN HFA) 108 (90 Base) MCG/ACT inhaler Inhale 2 puffs into the lungs every 4 (four) hours as needed for wheezing or shortness of breath (cough, shortness of breath or wheezing.). 1 Inhaler 3   amLODipine (NORVASC) 5 MG tablet Take 5 mg by mouth daily.     citalopram (CELEXA) 20 MG tablet Take 1 tablet (20 mg total) by mouth daily. 30 tablet 0   gabapentin (NEURONTIN) 300 MG capsule Take 300 mg by mouth at bedtime.     hydrochlorothiazide (HYDRODIURIL) 25 MG tablet Take 25 mg by mouth daily.     ipratropium-albuterol (DUONEB) 0.5-2.5 (3) MG/3ML SOLN Take 3 mLs by  nebulization every 6 (six) hours as needed.     lovastatin (MEVACOR) 40 MG tablet Take 40 mg by mouth at bedtime.     olmesartan (BENICAR) 40 MG tablet Take 1 tablet (40 mg total) by mouth daily. 30 tablet 11   oxybutynin (DITROPAN) 5 MG tablet Take 5 mg by mouth 3 (three) times daily.      oxyCODONE (OXY IR/ROXICODONE) 5 MG immediate release tablet TAKE ONE TABLET BY MOUTH EVERY 6 HOURS AS NEEDED FOR UP TO 14 DAYS. 20 tablet 0   predniSONE (DELTASONE) 20 MG tablet 3 tablets x 3 days; then 2 tablets x 3 days, then 1 tablet x 3 days Orally As directed for 9 days     TRELEGY ELLIPTA 100-62.5-25 MCG/ACT AEPB Inhale 1 puff into the lungs daily.     No facility-administered medications prior to visit.    Review of Systems  Constitutional:  Negative for chills, fever, malaise/fatigue and weight loss.  HENT:  Negative for hearing loss, sore throat and tinnitus.   Eyes:  Negative for blurred vision and double vision.  Respiratory:  Positive for cough, sputum production and shortness of breath. Negative for hemoptysis, wheezing and stridor.   Cardiovascular:  Negative for chest pain, palpitations, orthopnea, leg swelling and PND.  Gastrointestinal:  Negative for abdominal pain, constipation, diarrhea, heartburn, nausea and vomiting.  Genitourinary:  Negative for dysuria, hematuria and urgency.  Musculoskeletal:  Negative for joint pain and myalgias.  Skin:  Negative for itching and rash.  Neurological:  Negative for dizziness, tingling, weakness and headaches.  Endo/Heme/Allergies:  Negative for environmental allergies. Does not bruise/bleed easily.  Psychiatric/Behavioral:  Negative for depression. The patient is not nervous/anxious and does not have insomnia.   All other systems reviewed and are negative.    Objective:  Physical Exam Vitals reviewed.  Constitutional:      General: She is not in acute distress.    Appearance: She is well-developed.  HENT:     Head: Normocephalic and  atraumatic.  Eyes:     General: No scleral icterus.    Conjunctiva/sclera: Conjunctivae normal.     Pupils: Pupils are equal, round, and reactive to light.  Neck:     Vascular: No JVD.     Trachea: No tracheal deviation.  Cardiovascular:     Rate and Rhythm: Normal rate and regular rhythm.     Heart sounds: Normal heart sounds. No murmur heard. Pulmonary:     Effort: Pulmonary effort is normal. No tachypnea, accessory muscle usage or respiratory distress.     Breath sounds: No stridor. Wheezing present. No rhonchi or rales.  Abdominal:     General: There is no distension.       Palpations: Abdomen is soft.     Tenderness: There is no abdominal tenderness.  Musculoskeletal:        General: No tenderness.     Cervical back: Neck supple.  Lymphadenopathy:     Cervical: No cervical adenopathy.  Skin:    General: Skin is warm and dry.     Capillary Refill: Capillary refill takes less than 2 seconds.     Findings: No rash.  Neurological:     Mental Status: She is alert and oriented to person, place, and time.  Psychiatric:        Behavior: Behavior normal.      Vitals:   06/20/22 1012  BP: 110/70  Pulse: 84  Temp: 98.2 F (36.8 C)  TempSrc: Oral  SpO2: 96%  Weight: 143 lb 3.2 oz (65 kg)  Height: 5' 7" (1.702 m)   96% on RA BMI Readings from Last 3 Encounters:  06/20/22 22.43 kg/m  05/16/22 21.27 kg/m  05/14/22 20.99 kg/m   Wt Readings from Last 3 Encounters:  06/20/22 143 lb 3.2 oz (65 kg)  05/16/22 135 lb 12.8 oz (61.6 kg)  05/14/22 134 lb (60.8 kg)     CBC    Component Value Date/Time   WBC 10.0 07/28/2021 1147   RBC 4.87 07/28/2021 1147   HGB 15.8 (H) 07/28/2021 1147   HCT 47.5 (H) 07/28/2021 1147   PLT 271 07/28/2021 1147   MCV 97.5 07/28/2021 1147   MCH 32.4 07/28/2021 1147   MCHC 33.3 07/28/2021 1147   RDW 14.9 07/28/2021 1147   LYMPHSABS 2.8 07/28/2021 1147   MONOABS 0.8 07/28/2021 1147   EOSABS 0.2 07/28/2021 1147   BASOSABS 0.1 07/28/2021  1147      Chest Imaging:  Nuclear medicine pet imaging March 2024: Hypermetabolic 11 mm pulmonary nodule concerning for primary bronchogenic carcinoma. The patient's images have been independently reviewed by me.    Pulmonary Functions Testing Results:    Latest Ref Rng & Units 06/03/2022   11:20 AM 02/16/2019    3:23 PM  PFT Results  FVC-Pre L  1.37   FVC-Predicted Pre % 54  48   FVC-Post L 1.64  1.87   FVC-Predicted Post % 49  65   Pre FEV1/FVC % % 54  59   Post FEV1/FCV % % 50  46   FEV1-Pre L 0.95  0.81   FEV1-Predicted Pre % 38  36   FEV1-Post L 0.82  0.87   DLCO uncorrected ml/min/mmHg 10.31  10.88   DLCO UNC% % 49  49   DLCO corrected ml/min/mmHg 10.31  10.81   DLCO COR %Predicted % 49  49   DLVA Predicted % 74  86   TLC L  8.36   TLC % Predicted %  151   RV % Predicted %  299     FeNO:   Pathology:   Echocardiogram:   Heart Catheterization:     Assessment & Plan:     ICD-10-CM   1. Pulmonary nodule  R91.1     2. Abnormal PET of left lung  R94.2     3. Tobacco use  Z72.0       Discussion:  This is a 71-year-old female longstanding history of tobacco use.  Currently using Trelegy Ellipta for her COPD management.  She is still smoking a pack per day.  She has severe COPD at baseline.  Not felt to be a candidate for surgical evaluation due to lung function.  She has   a hypermetabolic left upper lobe pulmonary nodule concerning for primary bronchogenic carcinoma.  Plan: Patient is agreeable for robotic assisted navigational bronchoscopy. Today in the office we talked about the risk benefits alternatives of consideration for bronchoscopy and biopsy. Patient is agreeable to proceed. We talked about the risk of bleeding and pneumothorax. Tentative bronchoscopy date will be on 07/09/2022. We appreciate the PCC's help with scheduling.    Current Outpatient Medications:    albuterol (PROVENTIL HFA;VENTOLIN HFA) 108 (90 Base) MCG/ACT inhaler, Inhale 2  puffs into the lungs every 4 (four) hours as needed for wheezing or shortness of breath (cough, shortness of breath or wheezing.)., Disp: 1 Inhaler, Rfl: 3   amLODipine (NORVASC) 5 MG tablet, Take 5 mg by mouth daily., Disp: , Rfl:    citalopram (CELEXA) 20 MG tablet, Take 1 tablet (20 mg total) by mouth daily., Disp: 30 tablet, Rfl: 0   gabapentin (NEURONTIN) 300 MG capsule, Take 300 mg by mouth at bedtime., Disp: , Rfl:    hydrochlorothiazide (HYDRODIURIL) 25 MG tablet, Take 25 mg by mouth daily., Disp: , Rfl:    ipratropium-albuterol (DUONEB) 0.5-2.5 (3) MG/3ML SOLN, Take 3 mLs by nebulization every 6 (six) hours as needed., Disp: , Rfl:    lovastatin (MEVACOR) 40 MG tablet, Take 40 mg by mouth at bedtime., Disp: , Rfl:    olmesartan (BENICAR) 40 MG tablet, Take 1 tablet (40 mg total) by mouth daily., Disp: 30 tablet, Rfl: 11   oxybutynin (DITROPAN) 5 MG tablet, Take 5 mg by mouth 3 (three) times daily. , Disp: , Rfl:    oxyCODONE (OXY IR/ROXICODONE) 5 MG immediate release tablet, TAKE ONE TABLET BY MOUTH EVERY 6 HOURS AS NEEDED FOR UP TO 14 DAYS., Disp: 20 tablet, Rfl: 0   predniSONE (DELTASONE) 20 MG tablet, 3 tablets x 3 days; then 2 tablets x 3 days, then 1 tablet x 3 days Orally As directed for 9 days, Disp: , Rfl:    predniSONE (DELTASONE) 10 MG tablet, Take 4 tabs by mouth once daily x4 days, then 3 tabs x4 days, 2 tabs x4 days, 1 tab x4 days and stop., Disp: 40 tablet, Rfl: 0   TRELEGY ELLIPTA 100-62.5-25 MCG/ACT AEPB, Inhale 1 puff into the lungs daily., Disp: , Rfl:   I spent 63 minutes dedicated to the care of this patient on the date of this encounter to include pre-visit review of records, face-to-face time with the patient discussing conditions above, post visit ordering of testing, clinical documentation with the electronic health record, making appropriate referrals as documented, and communicating necessary findings to members of the patients care team.   Nya Monds L Jazz Rogala,  DO [facility] 06/20/2022 10:27 AM    

## 2022-06-20 NOTE — Patient Instructions (Signed)
Thank you for visiting Dr. Valeta Harms at [facility]. Today we recommend the following:  Orders Placed This Encounter  Procedures   Procedural/ Surgical Case Request: ROBOTIC ASSISTED NAVIGATIONAL BRONCHOSCOPY, VIDEO BRONCHOSCOPY WITH ENDOBRONCHIAL ULTRASOUND   CT Super D Chest Wo Contrast   Ambulatory referral to Pulmonology   Meds ordered this encounter  Medications   predniSONE (DELTASONE) 10 MG tablet    Sig: Take 4 tabs by mouth once daily x4 days, then 3 tabs x4 days, 2 tabs x4 days, 1 tab x4 days and stop.    Dispense:  40 tablet    Refill:  0   Bronchoscopy on 07/09/2022  Return in about 22 days (around 07/12/2022) for with Eric Form, NP. After bronchoscopy     Please do your part to reduce the spread of COVID-19.

## 2022-06-26 ENCOUNTER — Ambulatory Visit
Admission: RE | Admit: 2022-06-26 | Discharge: 2022-06-26 | Disposition: A | Discharge status: Home / Self Care | Payer: Medicare HMO | Source: Ambulatory Visit | Attending: Pulmonary Disease | Admitting: Pulmonary Disease

## 2022-06-26 DIAGNOSIS — R942 Abnormal results of pulmonary function studies: Secondary | ICD-10-CM

## 2022-06-26 DIAGNOSIS — R911 Solitary pulmonary nodule: Secondary | ICD-10-CM

## 2022-07-04 ENCOUNTER — Other Ambulatory Visit: Payer: Self-pay

## 2022-07-04 ENCOUNTER — Encounter (HOSPITAL_COMMUNITY): Payer: Self-pay | Admitting: Pulmonary Disease

## 2022-07-04 NOTE — Pre-Procedure Instructions (Signed)
PCP - Ronalee Belts Cardiologist -pt denies   PPM/ICD - pt denies  EKG - 07/30/21 Stress Test - pt denies ECHO - 04/02/13 Cardiac Cath - pt denies  Sleep Study/CPAP - pt denies  Diabetic- pt states she is no longer on diabetic medications.  Fasting Blood Sugar -  Checks Blood Sugar-pt states she does not check her blood sugar at home. I encouraged patient to check blood sugar day of surgery   Blood Thinner Instructions:pt denies Aspirin Instructions:n/a  NPO after Midnight   COVID TEST- yes, pt aware to have Covid test on 07/05/22.    Anesthesia review: NO   Patient verbally denies any shortness of breath, fever, cough and chest pain during phone call.     -------------  SDW INSTRUCTIONS given:   Your procedure is scheduled on 07/09/22.             Report to [facility] Main Entrance "A" at  8:00  A.M., and check in at the Admitting office.             Call this number if you have problems the morning of surgery:             [phone]               Remember:             Do not eat or drink after midnight the night before your surgery                          Take these medicines the morning of surgery with A SIP OF WATER  amlodipine, oxybutynin, celexa,trelegy inhalers, duoneb, albuterol inhaler-pt will bring   As of today, STOP taking any Aspirin (unless otherwise instructed by your surgeon) Aleve, Naproxen, Ibuprofen, Motrin, Advil, Goody's, BC's, all herbal medications, fish oil, and all vitamins.    Diabetic instructions-DO NOT TAKE any diabetic medications THE DAY OF SURGERY   ** PLEASE check your blood sugar the morning of your surgery when you wake up and every 2 hours until you get to the Short Stay unit.   If your blood sugar is less than 70 mg/dL, you will need to treat for low blood sugar: Do not take insulin. Treat a low blood sugar (less than 70 mg/dL) with  cup of clear juice (cranberry or apple), 4 glucose tablets, OR glucose gel. Recheck blood sugar in  15 minutes after treatment (to make sure it is greater than 70 mg/dL). If your blood sugar is not greater than 70 mg/dL on recheck, call [phone] for further instructions.                       Do not wear jewelry, make up, or nail polish            Do not wear lotions, powders, perfumes/colognes, or deodorant.            Do not shave 48 hours prior to surgery.  Men may shave face and neck.            Do not bring valuables to the hospital.            [facility] is not responsible for any belongings or valuables.   Do NOT Smoke (Tobacco/Vaping) 24 hours prior to your procedure-pt notified. If you use a CPAP at night, you may bring all equipment for your overnight stay.   Contacts, glasses, dentures or  partials may not be worn into surgery.      For patients admitted to the hospital, discharge time will be determined by your treatment team.   Patients discharged the day of surgery will not be allowed to drive home, and someone needs to stay with them for 24 hours.     Special instructions:   [place]- Preparing For Surgery  Oral Hygiene is also important to reduce your risk of infection.  Remember - BRUSH YOUR TEETH THE MORNING OF SURGERY WITH YOUR REGULAR TOOTHPASTE   Before surgery, you can play an important role. Because skin is not sterile, your skin needs to be as free of germs as possible. You can reduce the number of germs on your skin by washing with Dial (or any antibacterial) Soap before surgery.    Please do not use if you have an allergy to antibacterial soaps. If your skin becomes reddened/irritated stop using the Antibacterial Soap  Do not shave (including legs and underarms) for at least 48 hours prior to surgery. It is OK to shave your face.   Please follow these instructions carefully.              Shower the NIGHT BEFORE SURGERY and the MORNING OF SURGERY with (DIAL) Antibacterial Soap. Wash your body and hair with your normal shampoo/soap then rinse. Using a  clean wash cloth wash from your neck down using the antibacterial soap. Wash thoroughly, paying special attention to the area where your surgery will be performed. Thoroughly rinse your body with warm water from the neck down.   Pat yourself dry with a CLEAN TOWEL.   Wear CLEAN PAJAMAS to bed the night before surgery.   Place CLEAN SHEETS on your bed the night of your surgery and DO NOT SLEEP WITH PETS.     Day of Surgery: Please shower morning of surgery using antibacterial soap Wear Clean/Comfortable clothing the morning of surgery Do not apply any deodorants/lotions.   Remember to brush your teeth WITH YOUR REGULAR TOOTHPASTE.   Questions were answered. Patient verbalized understanding of instructions.

## 2022-07-05 ENCOUNTER — Other Ambulatory Visit: Payer: Medicare HMO

## 2022-07-05 DIAGNOSIS — R911 Solitary pulmonary nodule: Secondary | ICD-10-CM

## 2022-07-09 ENCOUNTER — Encounter (HOSPITAL_COMMUNITY): Payer: Self-pay | Admitting: Pulmonary Disease

## 2022-07-09 ENCOUNTER — Ambulatory Visit (HOSPITAL_BASED_OUTPATIENT_CLINIC_OR_DEPARTMENT_OTHER): Payer: Medicare HMO | Admitting: Anesthesiology

## 2022-07-09 ENCOUNTER — Ambulatory Visit (HOSPITAL_COMMUNITY): Payer: Medicare HMO | Admitting: Anesthesiology

## 2022-07-09 ENCOUNTER — Other Ambulatory Visit: Payer: Self-pay

## 2022-07-09 ENCOUNTER — Ambulatory Visit (HOSPITAL_COMMUNITY): Payer: Medicare HMO

## 2022-07-09 ENCOUNTER — Ambulatory Visit (HOSPITAL_COMMUNITY)
Admission: RE | Admit: 2022-07-09 | Discharge: 2022-07-09 | Disposition: A | Discharge status: Home / Self Care | Payer: Medicare HMO | Attending: Pulmonary Disease | Admitting: Pulmonary Disease

## 2022-07-09 ENCOUNTER — Encounter (HOSPITAL_COMMUNITY)
Admission: RE | Disposition: A | Discharge status: Home / Self Care | Payer: Self-pay | Source: Home / Self Care | Attending: Pulmonary Disease

## 2022-07-09 DIAGNOSIS — K5730 Diverticulosis of large intestine without perforation or abscess without bleeding: Secondary | ICD-10-CM | POA: Insufficient documentation

## 2022-07-09 DIAGNOSIS — D4989 Neoplasm of unspecified behavior of other specified sites: Secondary | ICD-10-CM | POA: Insufficient documentation

## 2022-07-09 DIAGNOSIS — J449 Chronic obstructive pulmonary disease, unspecified: Secondary | ICD-10-CM | POA: Diagnosis not present

## 2022-07-09 DIAGNOSIS — F17210 Nicotine dependence, cigarettes, uncomplicated: Secondary | ICD-10-CM

## 2022-07-09 DIAGNOSIS — Z833 Family history of diabetes mellitus: Secondary | ICD-10-CM | POA: Diagnosis not present

## 2022-07-09 DIAGNOSIS — E119 Type 2 diabetes mellitus without complications: Secondary | ICD-10-CM | POA: Insufficient documentation

## 2022-07-09 DIAGNOSIS — N319 Neuromuscular dysfunction of bladder, unspecified: Secondary | ICD-10-CM | POA: Insufficient documentation

## 2022-07-09 DIAGNOSIS — D381 Neoplasm of uncertain behavior of trachea, bronchus and lung: Secondary | ICD-10-CM

## 2022-07-09 DIAGNOSIS — Z8249 Family history of ischemic heart disease and other diseases of the circulatory system: Secondary | ICD-10-CM | POA: Insufficient documentation

## 2022-07-09 DIAGNOSIS — R846 Abnormal cytological findings in specimens from respiratory organs and thorax: Secondary | ICD-10-CM | POA: Insufficient documentation

## 2022-07-09 DIAGNOSIS — R911 Solitary pulmonary nodule: Secondary | ICD-10-CM | POA: Diagnosis present

## 2022-07-09 DIAGNOSIS — E559 Vitamin D deficiency, unspecified: Secondary | ICD-10-CM | POA: Insufficient documentation

## 2022-07-09 DIAGNOSIS — G4700 Insomnia, unspecified: Secondary | ICD-10-CM | POA: Insufficient documentation

## 2022-07-09 DIAGNOSIS — F329 Major depressive disorder, single episode, unspecified: Secondary | ICD-10-CM | POA: Insufficient documentation

## 2022-07-09 DIAGNOSIS — G4734 Idiopathic sleep related nonobstructive alveolar hypoventilation: Secondary | ICD-10-CM | POA: Insufficient documentation

## 2022-07-09 DIAGNOSIS — E785 Hyperlipidemia, unspecified: Secondary | ICD-10-CM | POA: Insufficient documentation

## 2022-07-09 DIAGNOSIS — N2889 Other specified disorders of kidney and ureter: Secondary | ICD-10-CM | POA: Insufficient documentation

## 2022-07-09 DIAGNOSIS — J988 Other specified respiratory disorders: Secondary | ICD-10-CM | POA: Insufficient documentation

## 2022-07-09 DIAGNOSIS — R918 Other nonspecific abnormal finding of lung field: Secondary | ICD-10-CM | POA: Insufficient documentation

## 2022-07-09 DIAGNOSIS — I10 Essential (primary) hypertension: Secondary | ICD-10-CM | POA: Insufficient documentation

## 2022-07-09 DIAGNOSIS — S22000A Wedge compression fracture of unspecified thoracic vertebra, initial encounter for closed fracture: Secondary | ICD-10-CM | POA: Insufficient documentation

## 2022-07-09 DIAGNOSIS — R942 Abnormal results of pulmonary function studies: Secondary | ICD-10-CM | POA: Diagnosis not present

## 2022-07-09 DIAGNOSIS — D72829 Elevated white blood cell count, unspecified: Secondary | ICD-10-CM | POA: Insufficient documentation

## 2022-07-09 DIAGNOSIS — M810 Age-related osteoporosis without current pathological fracture: Secondary | ICD-10-CM | POA: Insufficient documentation

## 2022-07-09 HISTORY — PX: BRONCHIAL NEEDLE ASPIRATION BIOPSY: SHX5106

## 2022-07-09 HISTORY — PX: BRONCHIAL BIOPSY: SHX5109

## 2022-07-09 HISTORY — PX: BRONCHIAL BRUSHINGS: SHX5108

## 2022-07-09 HISTORY — DX: Malignant (primary) neoplasm, unspecified: C80.1

## 2022-07-09 HISTORY — PX: VIDEO BRONCHOSCOPY WITH ENDOBRONCHIAL ULTRASOUND: SHX6177

## 2022-07-09 HISTORY — PX: FIDUCIAL MARKER PLACEMENT: SHX6858

## 2022-07-09 LAB — BASIC METABOLIC PANEL
Anion gap: 10 (ref 5–15)
BUN: 23 mg/dL (ref 8–23)
CO2: 25 mmol/L (ref 22–32)
Calcium: 8.8 mg/dL — ABNORMAL LOW (ref 8.9–10.3)
Chloride: 99 mmol/L (ref 98–111)
Creatinine, Ser: 0.95 mg/dL (ref 0.44–1.00)
GFR, Estimated: >60 mL/min (ref >60)
Glucose, Bld: 130 mg/dL — ABNORMAL HIGH (ref 70–99)
Potassium: 3.1 mmol/L — ABNORMAL LOW (ref 3.5–5.1)
Sodium: 134 mmol/L — ABNORMAL LOW (ref 135–145)

## 2022-07-09 LAB — CBC
HCT: 41.9 % (ref 36.0–46.0)
Hemoglobin: 13.8 g/dL (ref 12.0–15.0)
MCH: 32.9 pg (ref 26.0–34.0)
MCHC: 32.9 g/dL (ref 30.0–36.0)
MCV: 99.8 fL (ref 80.0–100.0)
Platelets: 244 10*3/uL (ref 150–400)
RBC: 4.2 MIL/uL (ref 3.87–5.11)
RDW: 15.3 % (ref 11.5–15.5)
WBC: 13.6 10*3/uL — ABNORMAL HIGH (ref 4.0–10.5)
nRBC: 0 % (ref 0.0–0.2)

## 2022-07-09 LAB — NOVEL CORONAVIRUS, NAA: SARS-CoV-2, NAA: NOT DETECTED

## 2022-07-09 LAB — SPECIMEN STATUS REPORT

## 2022-07-09 LAB — GLUCOSE, CAPILLARY
Glucose-Capillary: 147 mg/dL — ABNORMAL HIGH (ref 70–99)
Glucose-Capillary: 94 mg/dL (ref 70–99)

## 2022-07-09 SURGERY — BRONCHOSCOPY, WITH BIOPSY USING ELECTROMAGNETIC NAVIGATION
Anesthesia: General | Laterality: Left

## 2022-07-09 MED ORDER — DEXAMETHASONE SODIUM PHOSPHATE 10 MG/ML IJ SOLN
INTRAMUSCULAR | Status: DC | PRN
  Administered 2022-07-09: 5 mg via INTRAVENOUS

## 2022-07-09 MED ORDER — PHENYLEPHRINE HCL-NACL 20-0.9 MG/250ML-% IV SOLN
INTRAVENOUS | Status: DC | PRN
  Administered 2022-07-09: 40 ug/min via INTRAVENOUS

## 2022-07-09 MED ORDER — CHLORHEXIDINE GLUCONATE 0.12 % MT SOLN
15.0000 mL | Freq: Once | OROMUCOSAL | Status: AC
  Administered 2022-07-09: 15 mL via OROMUCOSAL
  Filled 2022-07-09: qty 15

## 2022-07-09 MED ORDER — ONDANSETRON HCL 4 MG/2ML IJ SOLN
INTRAMUSCULAR | Status: DC | PRN
  Administered 2022-07-09: 4 mg via INTRAVENOUS

## 2022-07-09 MED ORDER — ROCURONIUM BROMIDE 10 MG/ML (PF) SYRINGE
PREFILLED_SYRINGE | INTRAVENOUS | Status: DC | PRN
  Administered 2022-07-09: 50 mg via INTRAVENOUS

## 2022-07-09 MED ORDER — FENTANYL CITRATE (PF) 250 MCG/5ML IJ SOLN
INTRAMUSCULAR | Status: DC | PRN
  Administered 2022-07-09 (×4): 25 ug via INTRAVENOUS

## 2022-07-09 MED ORDER — PHENYLEPHRINE 80 MCG/ML (10ML) SYRINGE FOR IV PUSH (FOR BLOOD PRESSURE SUPPORT)
PREFILLED_SYRINGE | INTRAVENOUS | Status: DC | PRN
  Administered 2022-07-09: 160 ug via INTRAVENOUS
  Administered 2022-07-09: 80 ug via INTRAVENOUS
  Administered 2022-07-09 (×3): 160 ug via INTRAVENOUS

## 2022-07-09 MED ORDER — SUGAMMADEX SODIUM 200 MG/2ML IV SOLN
INTRAVENOUS | Status: DC | PRN
  Administered 2022-07-09: 200 mg via INTRAVENOUS

## 2022-07-09 MED ORDER — INSULIN ASPART 100 UNIT/ML IJ SOLN
0.0000 [IU] | INTRAMUSCULAR | Status: DC | PRN
  Administered 2022-07-09: 2 [IU] via SUBCUTANEOUS
  Filled 2022-07-09: qty 1

## 2022-07-09 MED ORDER — LIDOCAINE 2% (20 MG/ML) 5 ML SYRINGE
INTRAMUSCULAR | Status: DC | PRN
  Administered 2022-07-09: 40 mg via INTRAVENOUS

## 2022-07-09 MED ORDER — PROPOFOL 10 MG/ML IV BOLUS
INTRAVENOUS | Status: DC | PRN
  Administered 2022-07-09: 20 mg via INTRAVENOUS
  Administered 2022-07-09: 50 mg via INTRAVENOUS
  Administered 2022-07-09: 20 mg via INTRAVENOUS
  Administered 2022-07-09: 50 mg via INTRAVENOUS

## 2022-07-09 MED ORDER — PHENYLEPHRINE HCL-NACL 20-0.9 MG/250ML-% IV SOLN: INTRAVENOUS | Status: DC | PRN

## 2022-07-09 MED ORDER — ESMOLOL HCL 100 MG/10ML IV SOLN
INTRAVENOUS | Status: DC | PRN
  Administered 2022-07-09: 20 mg via INTRAVENOUS

## 2022-07-09 MED ORDER — FENTANYL CITRATE (PF) 100 MCG/2ML IJ SOLN: 25.0000 ug | INTRAMUSCULAR | Status: DC | PRN

## 2022-07-09 MED ORDER — LACTATED RINGERS IV SOLN: INTRAVENOUS | Status: DC

## 2022-07-09 MED ORDER — PROPOFOL 500 MG/50ML IV EMUL
INTRAVENOUS | Status: DC | PRN
  Administered 2022-07-09: 100 ug/kg/min via INTRAVENOUS

## 2022-07-09 SURGICAL SUPPLY — 30 items
BRUSH CYTOL CELLEBRITY 1.5X140 (MISCELLANEOUS) IMPLANT
CANISTER SUCT 3000ML PPV (MISCELLANEOUS) ×3 IMPLANT
CONT SPEC 4OZ CLIKSEAL STRL BL (MISCELLANEOUS) ×3 IMPLANT
COVER BACK TABLE 60X90IN (DRAPES) ×3 IMPLANT
COVER DOME SNAP 22 D (MISCELLANEOUS) ×3 IMPLANT
FORCEPS BIOP RJ4 1.8 (CUTTING FORCEPS) IMPLANT
GAUZE SPONGE 4X4 12PLY STRL (GAUZE/BANDAGES/DRESSINGS) ×3 IMPLANT
GLOVE BIO SURGEON STRL SZ7.5 (GLOVE) ×3 IMPLANT
GOWN STRL REUS W/ TWL LRG LVL3 (GOWN DISPOSABLE) ×3 IMPLANT
GOWN STRL REUS W/TWL LRG LVL3 (GOWN DISPOSABLE) ×3
KIT CLEAN ENDO COMPLIANCE (KITS) ×6 IMPLANT
KIT TURNOVER KIT B (KITS) ×3 IMPLANT
MARKER SKIN DUAL TIP RULER LAB (MISCELLANEOUS) ×3 IMPLANT
NDL EBUS SONO TIP PENTAX (NEEDLE) ×3 IMPLANT
NEEDLE EBUS SONO TIP PENTAX (NEEDLE) ×3 IMPLANT
NS IRRIG 1000ML POUR BTL (IV SOLUTION) ×3 IMPLANT
OIL SILICONE PENTAX (PARTS (SERVICE/REPAIRS)) ×3 IMPLANT
PAD ARMBOARD 7.5X6 YLW CONV (MISCELLANEOUS) ×6 IMPLANT
SOL ANTI FOG 6CC (MISCELLANEOUS) ×3 IMPLANT
SYR 20CC LL (SYRINGE) ×6 IMPLANT
SYR 20ML ECCENTRIC (SYRINGE) ×6 IMPLANT
SYR 50ML SLIP (SYRINGE) IMPLANT
SYR 5ML LUER SLIP (SYRINGE) ×3 IMPLANT
TOWEL OR 17X24 6PK STRL BLUE (TOWEL DISPOSABLE) ×3 IMPLANT
TRAP SPECIMEN MUCOUS 40CC (MISCELLANEOUS) IMPLANT
TUBE CONNECTING 20X1/4 (TUBING) ×6 IMPLANT
UNDERPAD 30X30 (UNDERPADS AND DIAPERS) ×3 IMPLANT
VALVE DISPOSABLE (MISCELLANEOUS) ×3 IMPLANT
WATER STERILE IRR 1000ML POUR (IV SOLUTION) ×3 IMPLANT
fiducial IMPLANT

## 2022-07-09 NOTE — Transfer of Care (Signed)
Immediate Anesthesia Transfer of Care Note  Patient: Holly May  Procedure(s) Performed: ROBOTIC ASSISTED NAVIGATIONAL BRONCHOSCOPY (Left) VIDEO BRONCHOSCOPY WITH ENDOBRONCHIAL ULTRASOUND (Bilateral) BRONCHIAL NEEDLE ASPIRATION BIOPSIES BRONCHIAL BIOPSIES BRONCHIAL BRUSHINGS FIDUCIAL MARKER PLACEMENT  Patient Location: Endoscopy Unit  Anesthesia Type:General  Level of Consciousness: awake, alert , and oriented  Airway & Oxygen Therapy: Patient Spontanous Breathing and Patient connected to nasal cannula oxygen  Post-op Assessment: Report given to RN and Post -op Vital signs reviewed and stable  Post vital signs: Reviewed and stable  Last Vitals:  Vitals Value Taken Time  BP 155/100   Temp    Pulse 88 07/09/22 1202  Resp 16 07/09/22 1202  SpO2 98 % 07/09/22 1202  Vitals shown include unvalidated device data.  Last Pain:  Vitals:   07/09/22 0844  TempSrc:   PainSc: 0-No pain         Complications: No notable events documented.

## 2022-07-09 NOTE — Anesthesia Preprocedure Evaluation (Addendum)
Anesthesia Evaluation  Patient identified by MRN, date of birth, ID band Patient awake    Reviewed: Allergy & Precautions, NPO status , Patient's Chart, lab work & pertinent test results  Airway Mallampati: III  TM Distance: >3 FB Neck ROM: Full    Dental  (+) Edentulous Upper, Edentulous Lower, Dental Advisory Given   Pulmonary COPD,  COPD inhaler, Current Smoker and Patient abstained from smoking.   Pulmonary exam normal breath sounds clear to auscultation       Cardiovascular hypertension, Pt. on medications Normal cardiovascular exam Rhythm:Regular Rate:Normal  TTE 2015 Left ventricle: The cavity size was normal. Wall thickness    was increased in a pattern of mild to moderate LVH.    Systolic function was normal. The estimated ejection    fraction was in the range of 60% to 65%. Wall motion was    normal; there were no regional wall motion abnormalities.    Doppler parameters are consistent with abnormal left    ventricular relaxation (grade 1 diastolic dysfunction).  - Aortic valve: Mildly calcified annulus. Trileaflet. No    significant regurgitation. Mean gradient: 27mm Hg (S).  - Mitral valve: Calcified annulus. Trivial regurgitation.  - Right atrium: Central venous pressure: 57mm Hg (est).  - Tricuspid valve: Trivial regurgitation.  - Pulmonary arteries: Systolic pressure could not be    accurately estimated.  - Pericardium, extracardiac: A prominent pericardial fat pad    was present. A small pericardial effusion was identified    anterior to the heart. No evidence of tamponade.     Neuro/Psych  PSYCHIATRIC DISORDERS  Depression    negative neurological ROS     GI/Hepatic Neg liver ROS,GERD  ,,  Endo/Other  diabetes    Renal/GU negative Renal ROS  negative genitourinary   Musculoskeletal  (+) Arthritis ,    Abdominal   Peds  Hematology negative hematology ROS (+)   Anesthesia Other Findings    Reproductive/Obstetrics                             Anesthesia Physical Anesthesia Plan  ASA: 3  Anesthesia Plan: General   Post-op Pain Management: Minimal or no pain anticipated   Induction: Intravenous  PONV Risk Score and Plan: 2 and Midazolam, Dexamethasone and Ondansetron  Airway Management Planned: Oral ETT  Additional Equipment:   Intra-op Plan:   Post-operative Plan: Extubation in OR  Informed Consent: I have reviewed the patients History and Physical, chart, labs and discussed the procedure including the risks, benefits and alternatives for the proposed anesthesia with the patient or authorized representative who has indicated his/her understanding and acceptance.     Dental advisory given  Plan Discussed with: CRNA  Anesthesia Plan Comments:        Anesthesia Quick Evaluation

## 2022-07-09 NOTE — Anesthesia Postprocedure Evaluation (Signed)
Anesthesia Post Note  Patient: Jodelle Gross  Procedure(s) Performed: ROBOTIC ASSISTED NAVIGATIONAL BRONCHOSCOPY (Left) VIDEO BRONCHOSCOPY WITH ENDOBRONCHIAL ULTRASOUND (Bilateral) BRONCHIAL NEEDLE ASPIRATION BIOPSIES BRONCHIAL BIOPSIES BRONCHIAL BRUSHINGS FIDUCIAL MARKER PLACEMENT     Patient location during evaluation: PACU Anesthesia Type: General Level of consciousness: awake and alert Pain management: pain level controlled Vital Signs Assessment: post-procedure vital signs reviewed and stable Respiratory status: spontaneous breathing, nonlabored ventilation, respiratory function stable and patient connected to nasal cannula oxygen Cardiovascular status: blood pressure returned to baseline and stable Postop Assessment: no apparent nausea or vomiting Anesthetic complications: no  No notable events documented.  Last Vitals:  Vitals:   07/09/22 1300 07/09/22 1310  BP: 137/84 132/71  Pulse: 89 80  Resp: 17 16  Temp:    SpO2: 100% 93%    Last Pain:  Vitals:   07/09/22 1310  TempSrc:   PainSc: 0-No pain                 Huda Petrey L Maylee Bare

## 2022-07-09 NOTE — Op Note (Signed)
Video Bronchoscopy with Robotic Assisted Bronchoscopic Navigation  Video Bronchoscopy with Endobronchial Ultrasound Procedure Note  Date of Operation: 07/09/2022   Pre-op Diagnosis: Pulmonary nodule, left upper lobe  Post-op Diagnosis: Pulmonary nodule, left upper lobe  Surgeon: Josephine Igo, DO   Assistants: None   Anesthesia: General endotracheal anesthesia  Operation: Flexible video fiberoptic bronchoscopy with robotic assistance and biopsies.  Estimated Blood Loss: Minimal  Complications: None  Indications and History: Holly May is a 72 y.o. female with history of pulmonary nodule, left upper lobe. The risks, benefits, complications, treatment options and expected outcomes were discussed with the patient.  The possibilities of pneumothorax, pneumonia, reaction to medication, pulmonary aspiration, perforation of a viscus, bleeding, failure to diagnose a condition and creating a complication requiring transfusion or operation were discussed with the patient who freely signed the consent.    Description of Procedure: The patient was seen in the Preoperative Area, was examined and was deemed appropriate to proceed.  The patient was taken to [facility] endoscopy room 3, identified as Jodelle Gross and the procedure verified as Flexible Video Fiberoptic Bronchoscopy.  A Time Out was held and the above information confirmed.   Prior to the date of the procedure a high-resolution CT scan of the chest was performed. Utilizing ION software program a virtual tracheobronchial tree was generated to allow the creation of distinct navigation pathways to the patient's parenchymal abnormalities. After being taken to the operating room general anesthesia was initiated and the patient  was orally intubated. The video fiberoptic bronchoscope was introduced via the endotracheal tube and a general inspection was performed which showed normal right and left lung anatomy, aspiration of the bilateral mainstems  was completed to remove any remaining secretions. Robotic catheter inserted into patient's endotracheal tube.   Target #1 pulmonary nodule, left upper lobe: The distinct navigation pathways prepared prior to this procedure were then utilized to navigate to patient's lesion identified on CT scan. The robotic catheter was secured into place and the vision probe was withdrawn.  Lesion location was approximated using fluoroscopy and three-dimensional cone beam CT imaging for CT-guided needle placement and for peripheral targeting. Under fluoroscopic guidance transbronchial needle brushings, transbronchial needle biopsies, and transbronchial forceps biopsies were performed to be sent for cytology and pathology.   Target #2 11L EBUS: The standard scope was then withdrawn and the endobronchial ultrasound was used to identify and characterize the peritracheal, hilar and bronchial lymph nodes. Inspection showed normal right and left lung anatomy no evidence of endobronchial lesion, enlarged small 8 mm lymph node within the left hilum. Using real-time ultrasound guidance Wang needle biopsies were take from Station 11 L nodes and were sent for cytology. The patient tolerated the procedure well without apparent complications. There was no significant blood loss. The bronchoscope was withdrawn. Anesthesia was reversed and the patient was taken to the PACU for recovery.   At the end of the procedure a general airway inspection was performed and there was no evidence of active bleeding. The bronchoscope was removed.  The patient tolerated the procedure well. There was no significant blood loss and there were no obvious complications. A post-procedural chest x-ray is pending.  Samples Target #1: 1. Transbronchial needle brushings from left upper lobe 2. Transbronchial Wang needle biopsies from left upper lobe 3. Transbronchial forceps biopsies from left upper lobe  Samples Target #: 1. Wang needle biopsies from 11L  node  Plans:  The patient will be discharged from the PACU to home when  recovered from anesthesia and after chest x-ray is reviewed. We will review the cytology, pathology results with the patient when they become available. Outpatient followup will be with Josephine Igo, DO.  Josephine Igo, DO [facility] 07/09/2022 2:06 PM

## 2022-07-09 NOTE — Interval H&P Note (Signed)
History and Physical Interval Note:  07/09/2022 8:48 AM  Holly May  has presented today for surgery, with the diagnosis of lung nodule.  The various methods of treatment have been discussed with the patient and family. After consideration of risks, benefits and other options for treatment, the patient has consented to  Procedure(s) with comments: ROBOTIC ASSISTED NAVIGATIONAL BRONCHOSCOPY (Left) - ION w/ CIOS VIDEO BRONCHOSCOPY WITH ENDOBRONCHIAL ULTRASOUND (Bilateral) as a surgical intervention.  The patient's history has been reviewed, patient examined, no change in status, stable for surgery.  I have reviewed the patient's chart and labs.  Questions were answered to the patient's satisfaction.     Rachel Bo Kristee Angus

## 2022-07-09 NOTE — Discharge Instructions (Signed)

## 2022-07-10 ENCOUNTER — Ambulatory Visit (INDEPENDENT_AMBULATORY_CARE_PROVIDER_SITE_OTHER): Payer: Medicare HMO | Admitting: Orthopaedic Surgery

## 2022-07-10 ENCOUNTER — Encounter: Payer: Self-pay | Admitting: Orthopaedic Surgery

## 2022-07-10 ENCOUNTER — Ambulatory Visit (INDEPENDENT_AMBULATORY_CARE_PROVIDER_SITE_OTHER): Payer: Medicare HMO

## 2022-07-10 DIAGNOSIS — S22080D Wedge compression fracture of T11-T12 vertebra, subsequent encounter for fracture with routine healing: Secondary | ICD-10-CM

## 2022-07-10 LAB — CYTOLOGY - NON PAP

## 2022-07-10 NOTE — Progress Notes (Signed)
My back does not hurt.  She had biopsy of lung area yesterday and is doing well.  Her back is not hurting.  X-rays were done of the thoracolumbar spine, reported separately.  NV intact.  Encounter Diagnosis  Name Primary?   Compression fracture of T11 vertebra with routine healing, subsequent encounter Yes   I will see prn.  Call if any problem.  Precautions discussed.  Electronically Signed Darreld Mclean, MD 4/10/20249:41 AM

## 2022-07-11 LAB — CYTOLOGY - NON PAP

## 2022-07-14 ENCOUNTER — Encounter (HOSPITAL_COMMUNITY): Payer: Self-pay | Admitting: Pulmonary Disease

## 2022-07-16 ENCOUNTER — Ambulatory Visit (INDEPENDENT_AMBULATORY_CARE_PROVIDER_SITE_OTHER): Payer: Medicare HMO | Admitting: Acute Care

## 2022-07-16 ENCOUNTER — Ambulatory Visit (INDEPENDENT_AMBULATORY_CARE_PROVIDER_SITE_OTHER): Payer: Medicare HMO

## 2022-07-16 ENCOUNTER — Encounter: Payer: Self-pay | Admitting: Acute Care

## 2022-07-16 ENCOUNTER — Other Ambulatory Visit: Payer: Self-pay

## 2022-07-16 ENCOUNTER — Encounter (HOSPITAL_COMMUNITY): Payer: Self-pay | Admitting: Emergency Medicine

## 2022-07-16 ENCOUNTER — Observation Stay (HOSPITAL_COMMUNITY): Payer: Medicare HMO

## 2022-07-16 ENCOUNTER — Observation Stay (HOSPITAL_COMMUNITY)
Admission: EM | Admit: 2022-07-16 | Discharge: 2022-07-17 | Disposition: A | Discharge status: Home / Self Care | Payer: Medicare HMO | Attending: Internal Medicine | Admitting: Internal Medicine

## 2022-07-16 VITALS — BP 120/64 | HR 51 | Wt 139.2 lb

## 2022-07-16 DIAGNOSIS — Z87891 Personal history of nicotine dependence: Secondary | ICD-10-CM | POA: Diagnosis not present

## 2022-07-16 DIAGNOSIS — Z7951 Long term (current) use of inhaled steroids: Secondary | ICD-10-CM | POA: Diagnosis not present

## 2022-07-16 DIAGNOSIS — E1140 Type 2 diabetes mellitus with diabetic neuropathy, unspecified: Secondary | ICD-10-CM | POA: Diagnosis not present

## 2022-07-16 DIAGNOSIS — A419 Sepsis, unspecified organism: Secondary | ICD-10-CM | POA: Diagnosis not present

## 2022-07-16 DIAGNOSIS — I4891 Unspecified atrial fibrillation: Secondary | ICD-10-CM

## 2022-07-16 DIAGNOSIS — I4719 Other supraventricular tachycardia: Secondary | ICD-10-CM | POA: Diagnosis not present

## 2022-07-16 DIAGNOSIS — Z85118 Personal history of other malignant neoplasm of bronchus and lung: Secondary | ICD-10-CM | POA: Diagnosis not present

## 2022-07-16 DIAGNOSIS — J441 Chronic obstructive pulmonary disease with (acute) exacerbation: Secondary | ICD-10-CM | POA: Diagnosis not present

## 2022-07-16 DIAGNOSIS — Z9889 Other specified postprocedural states: Secondary | ICD-10-CM | POA: Diagnosis not present

## 2022-07-16 DIAGNOSIS — R911 Solitary pulmonary nodule: Secondary | ICD-10-CM | POA: Diagnosis not present

## 2022-07-16 DIAGNOSIS — R000 Tachycardia, unspecified: Secondary | ICD-10-CM

## 2022-07-16 DIAGNOSIS — F17210 Nicotine dependence, cigarettes, uncomplicated: Secondary | ICD-10-CM

## 2022-07-16 DIAGNOSIS — Z79899 Other long term (current) drug therapy: Secondary | ICD-10-CM | POA: Insufficient documentation

## 2022-07-16 DIAGNOSIS — K219 Gastro-esophageal reflux disease without esophagitis: Secondary | ICD-10-CM | POA: Insufficient documentation

## 2022-07-16 DIAGNOSIS — R944 Abnormal results of kidney function studies: Secondary | ICD-10-CM | POA: Insufficient documentation

## 2022-07-16 DIAGNOSIS — I10 Essential (primary) hypertension: Secondary | ICD-10-CM | POA: Insufficient documentation

## 2022-07-16 DIAGNOSIS — J181 Lobar pneumonia, unspecified organism: Secondary | ICD-10-CM | POA: Insufficient documentation

## 2022-07-16 DIAGNOSIS — R0602 Shortness of breath: Secondary | ICD-10-CM | POA: Diagnosis present

## 2022-07-16 DIAGNOSIS — E876 Hypokalemia: Secondary | ICD-10-CM | POA: Diagnosis present

## 2022-07-16 DIAGNOSIS — J189 Pneumonia, unspecified organism: Secondary | ICD-10-CM

## 2022-07-16 LAB — BASIC METABOLIC PANEL
Anion gap: 13 (ref 5–15)
BUN: 37 mg/dL — ABNORMAL HIGH (ref 8–23)
CO2: 26 mmol/L (ref 22–32)
Calcium: 8.8 mg/dL — ABNORMAL LOW (ref 8.9–10.3)
Chloride: 96 mmol/L — ABNORMAL LOW (ref 98–111)
Creatinine, Ser: 1.19 mg/dL — ABNORMAL HIGH (ref 0.44–1.00)
GFR, Estimated: 49 mL/min — ABNORMAL LOW (ref >60)
Glucose, Bld: 147 mg/dL — ABNORMAL HIGH (ref 70–99)
Potassium: 2.7 mmol/L — CL (ref 3.5–5.1)
Sodium: 135 mmol/L (ref 135–145)

## 2022-07-16 LAB — TSH: TSH: 0.784 u[IU]/mL (ref 0.350–4.500)

## 2022-07-16 LAB — CBC WITH DIFFERENTIAL/PLATELET
Abs Immature Granulocytes: 0.19 10*3/uL — ABNORMAL HIGH (ref 0.00–0.07)
Basophils Absolute: 0.1 10*3/uL (ref 0.0–0.1)
Basophils Relative: 0 %
Eosinophils Absolute: 0.3 10*3/uL (ref 0.0–0.5)
Eosinophils Relative: 1 %
HCT: 37.7 % (ref 36.0–46.0)
Hemoglobin: 13 g/dL (ref 12.0–15.0)
Immature Granulocytes: 1 %
Lymphocytes Relative: 19 %
Lymphs Abs: 4.1 10*3/uL — ABNORMAL HIGH (ref 0.7–4.0)
MCH: 33.5 pg (ref 26.0–34.0)
MCHC: 34.5 g/dL (ref 30.0–36.0)
MCV: 97.2 fL (ref 80.0–100.0)
Monocytes Absolute: 2 10*3/uL — ABNORMAL HIGH (ref 0.1–1.0)
Monocytes Relative: 9 %
Neutro Abs: 14.7 10*3/uL — ABNORMAL HIGH (ref 1.7–7.7)
Neutrophils Relative %: 70 %
Platelets: 271 10*3/uL (ref 150–400)
RBC: 3.88 MIL/uL (ref 3.87–5.11)
RDW: 14.4 % (ref 11.5–15.5)
WBC: 21.3 10*3/uL — ABNORMAL HIGH (ref 4.0–10.5)
nRBC: 0 % (ref 0.0–0.2)

## 2022-07-16 LAB — NA AND K (SODIUM & POTASSIUM), RAND UR
Potassium Urine: 24 mmol/L
Sodium, Ur: 104 mmol/L

## 2022-07-16 LAB — MAGNESIUM: Magnesium: 1.9 mg/dL (ref 1.7–2.4)

## 2022-07-16 LAB — CBG MONITORING, ED
Glucose-Capillary: 167 mg/dL — ABNORMAL HIGH (ref 70–99)
Glucose-Capillary: 138 mg/dL — ABNORMAL HIGH (ref 70–99)

## 2022-07-16 LAB — PHOSPHORUS: Phosphorus: 2.8 mg/dL (ref 2.5–4.6)

## 2022-07-16 LAB — TROPONIN I (HIGH SENSITIVITY)
Troponin I (High Sensitivity): 6 ng/L (ref <18)
Troponin I (High Sensitivity): 3 ng/L (ref <18)

## 2022-07-16 MED ORDER — IOHEXOL 350 MG/ML SOLN
75.0000 mL | Freq: Once | INTRAVENOUS | Status: AC | PRN
  Administered 2022-07-16: 75 mL via INTRAVENOUS

## 2022-07-16 MED ORDER — POLYETHYLENE GLYCOL 3350 17 G PO PACK
17.0000 g | PACK | Freq: Every day | ORAL | Status: DC
  Administered 2022-07-16: 17 g via ORAL
  Filled 2022-07-16 (×2): qty 1

## 2022-07-16 MED ORDER — OXYBUTYNIN CHLORIDE 5 MG PO TABS
5.0000 mg | ORAL_TABLET | Freq: Three times a day (TID) | ORAL | Status: DC
  Administered 2022-07-16 – 2022-07-17 (×2): 5 mg via ORAL
  Filled 2022-07-16 (×2): qty 1

## 2022-07-16 MED ORDER — PANTOPRAZOLE SODIUM 40 MG PO TBEC
40.0000 mg | DELAYED_RELEASE_TABLET | Freq: Every day | ORAL | Status: DC
  Administered 2022-07-17: 40 mg via ORAL
  Filled 2022-07-16: qty 1

## 2022-07-16 MED ORDER — ACETAMINOPHEN 325 MG PO TABS: 650.0000 mg | ORAL_TABLET | Freq: Four times a day (QID) | ORAL | Status: DC | PRN

## 2022-07-16 MED ORDER — PREDNISONE 20 MG PO TABS
40.0000 mg | ORAL_TABLET | Freq: Every day | ORAL | Status: DC
  Administered 2022-07-16: 40 mg via ORAL
  Filled 2022-07-16: qty 2

## 2022-07-16 MED ORDER — ACETAMINOPHEN 650 MG RE SUPP: 650.0000 mg | Freq: Four times a day (QID) | RECTAL | Status: DC | PRN

## 2022-07-16 MED ORDER — GABAPENTIN 300 MG PO CAPS
300.0000 mg | ORAL_CAPSULE | Freq: Every day | ORAL | Status: DC
  Administered 2022-07-17: 300 mg via ORAL
  Filled 2022-07-16: qty 1

## 2022-07-16 MED ORDER — PRAVASTATIN SODIUM 40 MG PO TABS: 40.0000 mg | ORAL_TABLET | Freq: Every day | ORAL | Status: DC

## 2022-07-16 MED ORDER — ENOXAPARIN SODIUM 40 MG/0.4ML IJ SOSY
40.0000 mg | PREFILLED_SYRINGE | INTRAMUSCULAR | Status: DC
  Administered 2022-07-16: 40 mg via SUBCUTANEOUS
  Filled 2022-07-16: qty 0.4

## 2022-07-16 MED ORDER — POTASSIUM CHLORIDE 10 MEQ/100ML IV SOLN
10.0000 meq | INTRAVENOUS | Status: AC
  Administered 2022-07-16 (×4): 10 meq via INTRAVENOUS
  Filled 2022-07-16 (×4): qty 100

## 2022-07-16 MED ORDER — POTASSIUM CHLORIDE CRYS ER 20 MEQ PO TBCR
40.0000 meq | EXTENDED_RELEASE_TABLET | Freq: Two times a day (BID) | ORAL | Status: DC
  Administered 2022-07-16: 40 meq via ORAL
  Filled 2022-07-16 (×2): qty 2

## 2022-07-16 MED ORDER — FLUTICASONE FUROATE-VILANTEROL 100-25 MCG/ACT IN AEPB
1.0000 | INHALATION_SPRAY | Freq: Every day | RESPIRATORY_TRACT | Status: DC
  Administered 2022-07-17: 1 via RESPIRATORY_TRACT
  Filled 2022-07-16: qty 28

## 2022-07-16 MED ORDER — IPRATROPIUM-ALBUTEROL 0.5-2.5 (3) MG/3ML IN SOLN
3.0000 mL | RESPIRATORY_TRACT | Status: DC
  Administered 2022-07-16 – 2022-07-17 (×3): 3 mL via RESPIRATORY_TRACT
  Filled 2022-07-16 (×3): qty 3

## 2022-07-16 MED ORDER — SODIUM CHLORIDE 0.9 % IV BOLUS
500.0000 mL | Freq: Once | INTRAVENOUS | Status: AC
  Administered 2022-07-16: 500 mL via INTRAVENOUS

## 2022-07-16 MED ORDER — IPRATROPIUM-ALBUTEROL 0.5-2.5 (3) MG/3ML IN SOLN: 3.0000 mL | Freq: Four times a day (QID) | RESPIRATORY_TRACT | Status: DC | PRN

## 2022-07-16 MED ORDER — CITALOPRAM HYDROBROMIDE 10 MG PO TABS
20.0000 mg | ORAL_TABLET | Freq: Every day | ORAL | Status: DC
  Administered 2022-07-17: 20 mg via ORAL
  Filled 2022-07-16: qty 2

## 2022-07-16 MED ORDER — AZITHROMYCIN 250 MG PO TABS
500.0000 mg | ORAL_TABLET | Freq: Every day | ORAL | Status: DC
  Administered 2022-07-16: 500 mg via ORAL
  Filled 2022-07-16: qty 2

## 2022-07-16 MED ORDER — SODIUM CHLORIDE 0.9 % IV SOLN
1.0000 g | INTRAVENOUS | Status: DC
  Administered 2022-07-16: 1 g via INTRAVENOUS
  Filled 2022-07-16: qty 10

## 2022-07-16 MED ORDER — UMECLIDINIUM BROMIDE 62.5 MCG/ACT IN AEPB
1.0000 | INHALATION_SPRAY | Freq: Every day | RESPIRATORY_TRACT | Status: DC
  Administered 2022-07-17: 1 via RESPIRATORY_TRACT
  Filled 2022-07-16: qty 7

## 2022-07-16 MED ORDER — LACTATED RINGERS IV BOLUS
1000.0000 mL | Freq: Once | INTRAVENOUS | Status: AC
  Administered 2022-07-16: 1000 mL via INTRAVENOUS

## 2022-07-16 NOTE — ED Triage Notes (Signed)
Per [facility] pt coming pulmonology office for shortness of breath x 3 days. Patient was found to be in A-fib RVR with rate ranging from 70-190. Denies any hx of a-fib. Patient placed on 3L [place] at office and breathing improved. 500 CC NS en route.

## 2022-07-16 NOTE — ED Provider Notes (Signed)
[facility] Provider Note   CSN: [id number] Arrival date & time: 07/16/22  1206     History  Chief Complaint  Patient presents with   Shortness of Breath    A-fib RVR    Holly May is a 72 y.o. female.   Shortness of Breath Patient sent in from pulmonary office for shortness of breath and chest pain.  Has reported atrial fibrillation however on review of EKG it appears more a sinus rhythm with atrial tachycardia and PACs.  No fevers.  No cough.  Did have recent lung biopsy.  Chest x-ray done at pulmonology and reassuring.    Past Medical History:  Diagnosis Date   Arthritis    Cancer    lung   COPD (chronic obstructive pulmonary disease)    Depression    Dyspnea    Essential hypertension, benign    Type 2 diabetes mellitus    Urinary incontinence     Home Medications Prior to Admission medications   Medication Sig Start Date End Date Taking? Authorizing Provider  albuterol (PROVENTIL HFA;VENTOLIN HFA) 108 (90 Base) MCG/ACT inhaler Inhale 2 puffs into the lungs every 4 (four) hours as needed for wheezing or shortness of breath (cough, shortness of breath or wheezing.). 02/18/18   Sherryll Burger, Pratik D, DO  alendronate (FOSAMAX) 70 MG tablet Take 70 mg by mouth once a week. 06/17/22   [provider]  amLODipine (NORVASC) 5 MG tablet Take 5 mg by mouth daily.    [provider]  citalopram (CELEXA) 20 MG tablet Take 1 tablet (20 mg total) by mouth daily. 06/14/16   Johnson, Clanford L, MD  gabapentin (NEURONTIN) 300 MG capsule Take 300 mg by mouth at bedtime.    [provider]  hydrochlorothiazide (HYDRODIURIL) 25 MG tablet Take 25 mg by mouth daily.    [provider]  ipratropium-albuterol (DUONEB) 0.5-2.5 (3) MG/3ML SOLN Take 3 mLs by nebulization every 6 (six) hours as needed. 06/11/22   [provider]  lisinopril (ZESTRIL) 40 MG tablet Take 40 mg by mouth daily.    [provider]  lovastatin (MEVACOR) 40 MG tablet Take 40 mg by mouth at bedtime.    [provider]  olmesartan (BENICAR) 40 MG tablet Take 1 tablet (40 mg total) by mouth daily. 04/05/22 04/05/23  Nyoka Cowden, MD  omeprazole (PRILOSEC) 20 MG capsule TAKE ONE CAPSULE BY MOUTH EVERY MORNING BEFORE BREAKFAST for 30    [provider]  oxybutynin (DITROPAN) 5 MG tablet Take 5 mg by mouth 3 (three) times daily.     [provider]  TRELEGY ELLIPTA 100-62.5-25 MCG/ACT AEPB Inhale 1 puff into the lungs daily.    [provider]      Allergies    Patient has no known allergies.    Review of Systems   Review of Systems  Respiratory:  Positive for shortness of breath.     Physical Exam Updated Vital Signs BP 115/81   Pulse 92   Temp 98.2 F (36.8 C) (Oral)   Resp (!) 27   Ht 5\' 7"  (1.702 m)   Wt 63 kg   SpO2 100%   BMI 21.77 kg/m  Physical Exam Vitals and nursing note reviewed.  HENT:     Head: Atraumatic.  Cardiovascular:     Rate and Rhythm: Tachycardia present. Rhythm irregular.  Pulmonary:     Breath sounds: No wheezing or rhonchi.  Chest:  Chest wall: No tenderness.  Musculoskeletal:     Right lower leg: No tenderness.     Left lower leg: No tenderness.  Neurological:     Mental Status: She is alert.     ED Results / Procedures / Treatments   Labs (all labs ordered are listed, but only abnormal results are displayed) Labs Reviewed  CBC WITH DIFFERENTIAL/PLATELET - Abnormal; Notable for the following components:      Result Value   WBC 21.3 (*)    Neutro Abs 14.7 (*)    Lymphs Abs 4.1 (*)    Monocytes Absolute 2.0 (*)    Abs Immature Granulocytes 0.19 (*)    All other components within normal limits  BASIC METABOLIC PANEL - Abnormal; Notable for the following components:   Potassium 2.7 (*)    Chloride 96 (*)    Glucose, Bld 147 (*)    BUN 37 (*)    Creatinine, Ser 1.19 (*)    Calcium 8.8 (*)    GFR, Estimated 49 (*)     All other components within normal limits  MAGNESIUM  TSH    EKG None  Radiology DG Chest 2 View  Result Date: 07/16/2022 CLINICAL DATA:  Atrial fibrillation.  Congestion. EXAM: CHEST - 2 VIEW COMPARISON:  Radiographs 07/09/2022 and 07/28/2021.  CT 06/26/2022. FINDINGS: The heart size and mediastinal contours are stable status post median sternotomy. There is diffuse aortic and coronary artery atherosclerosis. A previously demonstrated spiculated nodule posteriorly in the left upper lobe is faintly visible on the lateral view with an apparent new adjacent biopsy clip. No airspace disease, edema, pleural effusion or pneumothorax identified. Thoracolumbar scoliosis and multiple thoracic compression deformities are grossly stable from previous CT. No acute osseous findings are seen. IMPRESSION: 1. No evidence of acute cardiopulmonary process. 2. Previously demonstrated spiculated nodule in the left upper lobe is faintly visible on the lateral view with apparent new adjacent biopsy clip. Correlate with recent biopsy results. Electronically Signed   By: Carey Bullocks M.D.   On: 07/16/2022 11:31    Procedures Procedures    Medications Ordered in ED Medications  potassium chloride 10 mEq in 100 mL IVPB (has no administration in time range)  sodium chloride 0.9 % bolus 500 mL (500 mLs Intravenous New Bag/Given 07/16/22 1455)    ED Course/ Medical Decision Making/ A&P                             Medical Decision Making Amount and/or Complexity of Data Reviewed Labs: ordered.  Risk Prescription drug management.  Patient is shortness of breath and tachycardia.  Appears to be in atrial tachycardia.  Does not appear to be atrial fibrillation.  However is persistently tachycardic.  Does have a hypokalemia.  Chest x-ray reassuring.  Had mild hypotension.  Will give fluid bolus.  Creatinine slightly increased.  Will start supplementing potassium.  Magnesium reassuring.  I reviewed pulmonary  note.  However I feel she would benefit from mission to the hospital for electrolyte correction and monitoring of her heart rate.  Doubt pneumonia.  Pulmonary embolism felt less likely as a cause.  TSH was normal. Patient states she has had arrhythmia in the past, however I do not see documentation of this through previous reviewed cardiology notes.         Final Clinical Impression(s) / ED Diagnoses Final diagnoses:  Hypokalemia  Atrial tachycardia    Rx / DC Orders ED Discharge  Orders     None         Benjiman Core, MD 07/16/22 [phone]

## 2022-07-16 NOTE — Hospital Course (Signed)
Lung biopsy cancer - Dr. Elease Hashimoto   Hypokalemia Electrolytes

## 2022-07-16 NOTE — Progress Notes (Signed)
History of Present Illness Holly May is a 72 y.o. female current every day smoker  referred to Dr. Tonia Brooms for evaluation and biopsy of a spiculated 11 mm left upper lobe pulmonary nodule that was hypermetabolic concerning for a primary bronchogenic carcinoma. She underwent Flexible video fiberoptic bronchoscopy with robotic assistance and biopsies on 07/09/2022. She is here today for follow up.    07/16/2022 Pt. Presents for 1 week follow up after bronch on 07/09/2022. She presented to the office today with unsteady gate and she states she has been more short of breath than usual. She said this has been ongoing for 3 days. Additionally, HR was 120 after ambulating and then when checked within a minute of sitting her HR was 50. She does take antihypertensives, and she did take her medication today.  She states the first 2 days after the biopsy she felt good, and then she started feeling poorly the third day after her procedure. She is purse lip breathing although her says were 95% initially and are now 99%. She is requesting oxygen even though her sats are fine. We have placed her on 2 L [place] for comfort.   EKG done in the office shows atrial fib with RVR , rate of 129,  Voltage criteria for LVH (R(I)+S(III) exceeds 2.00 mV). -ST depression  +  T-abnormality -Seen with left ventricular hypertrophy (strain) or digitalis effect -consider ischemia consider Anterolateral ischemia.  She states she has been coughing up secretions, they are grayish white.She does note that she has been breaking out in sweats, but she does not indicate a fever.  Because the patient is so symptomatic, diaphoretic ,  we will send her to the ED at [facility]. She is in agreement with this plan. CXR has been done here STAT as I want to ensure rhythm change does not have a respiratory etiology as she had a recent  bronch with biopsies. Labs were not done as they will be repeated in the ED. She needs mag,K and Na evaluated and rate control..  She is diabetic. She does not have a documented history of atrial fibrillation that I can see in EPIC. I was unable to discuss bronch results with the patient as she was in acute distress upon presentation to the office and diagnostics for etiology of respiratory distress and unsteady gate  were initiated immediately.  EMS will transport patient to [place] ED as she is in a fib with RVR. CXR shows no acute process.  There is an EKG dated 07/09/2022 to compare to current EKG.    Test Results: CXR 07/16/2022 No evidence of acute cardiopulmonary process. Previously demonstrated spiculated nodule in the left upper lobe is faintly visible on the lateral view with apparent new adjacent biopsy clip. Correlate with recent biopsy results.  FINAL MICROSCOPIC DIAGNOSIS:  A. LUNG, LUL, FINE NEEDLE ASPIRATION:  - Non-small cell carcinoma, consistent with adenocarcinoma  - See comment   B. LUNG, LUL, BRUSHING:  - Atypical cells present   Immunohistochemical stains were performed. The tumor cells are positive  for TTF-1 and are negative for p40. The findings are consistent with an  adenocarcinoma.       Latest Ref Rng & Units 07/09/2022    8:48 AM 07/28/2021   11:47 AM 02/21/2019    5:02 AM  CBC  WBC 4.0 - 10.5 K/uL 13.6  10.0  12.1   Hemoglobin 12.0 - 15.0 g/dL 16.1  09.6  04.5   Hematocrit 36.0 - 46.0 % 41.9  47.5  37.9   Platelets 150 - 400 K/uL 244  271  258        Latest Ref Rng & Units 07/09/2022    8:48 AM 02/07/2022   10:51 AM 07/28/2021   11:47 AM  BMP  Glucose 70 - 99 mg/dL 161   096   BUN 8 - 23 mg/dL 23   17   Creatinine 0.45 - 1.00 mg/dL 4.09  8.11  9.14   Sodium 135 - 145 mmol/L 134   138   Potassium 3.5 - 5.1 mmol/L 3.1   3.1   Chloride 98 - 111 mmol/L 99   95   CO2 22 - 32 mmol/L 25   33   Calcium 8.9 - 10.3 mg/dL 8.8   9.2     BNP    Component Value Date/Time   BNP 45.0 02/17/2018 0946    ProBNP No results found for: "PROBNP"  PFT    Component Value Date/Time    FEV1PRE 0.95 06/03/2022 1120   FEV1POST 0.82 06/03/2022 1120   FVCPRE 1.37 02/16/2019 1523   FVCPOST 1.64 06/03/2022 1120   TLC 8.36 02/16/2019 1523   DLCOUNC 10.31 06/03/2022 1120   PREFEV1FVCRT 54 06/03/2022 1120   PSTFEV1FVCRT 50 06/03/2022 1120    DG Thoracic Spine 2 View  Result Date: 07/10/2022 Clinical: compression fracture T11 X-rays were done of the thoracolumbar spine, two views. There is a healing fracture of the T11 vertebra with about 40 % compression.  Bone quality is good. Impression: healing T11 vertebra fracture. Electronically Signed Darreld Mclean, MD 4/10/20249:39 AM  DG CHEST PORT 1 VIEW  Result Date: 07/09/2022 CLINICAL DATA:  Status post bronchoscopy. EXAM: PORTABLE CHEST 1 VIEW COMPARISON:  Radiograph of July 28, 2021. CT scan of June 26, 2022. PET scan of May 16, 2022. FINDINGS: Stable cardiomediastinal silhouette. Sternotomy wires are noted. Left upper lobe nodular density noted on prior CT scan is noted. No consolidative process is noted. Stable chronic postoperative or posttraumatic changes are seen involving distal left clavicle. IMPRESSION: Left upper lobe nodular density is again noted as described on prior CT scan and PET scans concerning for possible primary bronchogenic neoplasm. Electronically Signed   By: Lupita Raider M.D.   On: 07/09/2022 13:01   DG C-ARM BRONCHOSCOPY  Result Date: 07/09/2022 C-ARM BRONCHOSCOPY: Fluoroscopy was utilized by the requesting physician.  No radiographic interpretation.   CT Super D Chest Wo Contrast  Result Date: 06/27/2022 CLINICAL DATA:  Pulmonary nodule.  Pre-procedure planning. EXAM: CT CHEST WITHOUT CONTRAST TECHNIQUE: Multidetector CT imaging of the chest was performed using thin slice collimation for electromagnetic bronchoscopy planning purposes, without intravenous contrast. RADIATION DOSE REDUCTION: This exam was performed according to the departmental dose-optimization program which includes automated exposure  control, adjustment of the mA and/or kV according to patient size and/or use of iterative reconstruction technique. COMPARISON:  PET-CT 05/16/2022 FINDINGS: Cardiovascular: The heart size is normal. No substantial pericardial effusion. Coronary artery calcification is evident. Moderate atherosclerotic calcification is noted in the wall of the thoracic aorta. Mediastinum/Nodes: No mediastinal lymphadenopathy. No evidence for gross hilar lymphadenopathy although assessment is limited by the lack of intravenous contrast on the current study. The esophagus has normal imaging features. There is no axillary lymphadenopathy. Lungs/Pleura: Stable chronic atelectasis or scarring at the right base. 11 mm spiculated nodule posterior left upper lobe again noted (58/3) with tethering to the major fissure. No focal consolidation or pleural effusion. Upper Abdomen: Hyperattenuating exophytic lesion upper pole right kidney  measuring 2.2 cm is not substantially changed since 01/20/2019 suggesting benign etiology such as hemorrhagic or proteinaceous cyst as characterized on abdominal MRI 03/07/2022. No followup imaging is recommended. Musculoskeletal: No worrisome lytic or sclerotic osseous abnormality. Multilevel thoracic compression fractures are stable in the interval. IMPRESSION: 1. 11 mm spiculated nodule posterior left upper lobe with tethering to the major fissure, stable and shown to be hypermetabolic on previous PET imaging. 2. Stable chronic atelectasis or scarring at the right base. 3.  Aortic Atherosclerosis (ICD10-I70.0). Electronically Signed   By: Kennith Center M.D.   On: 06/27/2022 09:13     Past medical hx Past Medical History:  Diagnosis Date   Arthritis    Cancer    lung   COPD (chronic obstructive pulmonary disease)    Depression    Dyspnea    Essential hypertension, benign    Type 2 diabetes mellitus    Urinary incontinence      Social History   Tobacco Use   Smoking status: Every Day     Packs/day: 1.50    Years: 50.00    Additional pack years: 0.00    Total pack years: 75.00    Types: Cigarettes    Start date: 01/09/1971   Smokeless tobacco: Never   Tobacco comments:    Smoking 1 ppd.  Trying to quit.  06/20/2022 hfb  Vaping Use   Vaping Use: Never used  Substance Use Topics   Alcohol use: Yes    Comment: occ   Drug use: No    Ms.Gentle reports that she has been smoking cigarettes. She started smoking about 51 years ago. She has a 75.00 pack-year smoking history. She has never used smokeless tobacco. She reports current alcohol use. She reports that she does not use drugs.  Tobacco Cessation: Current every day smoker with a 75 pack year smoking history   Past surgical hx, Family hx, Social hx all reviewed.  Current Outpatient Medications on File Prior to Visit  Medication Sig   albuterol (PROVENTIL HFA;VENTOLIN HFA) 108 (90 Base) MCG/ACT inhaler Inhale 2 puffs into the lungs every 4 (four) hours as needed for wheezing or shortness of breath (cough, shortness of breath or wheezing.).   alendronate (FOSAMAX) 70 MG tablet Take 70 mg by mouth once a week.   amLODipine (NORVASC) 5 MG tablet Take 5 mg by mouth daily.   citalopram (CELEXA) 20 MG tablet Take 1 tablet (20 mg total) by mouth daily.   gabapentin (NEURONTIN) 300 MG capsule Take 300 mg by mouth at bedtime.   hydrochlorothiazide (HYDRODIURIL) 25 MG tablet Take 25 mg by mouth daily.   ipratropium-albuterol (DUONEB) 0.5-2.5 (3) MG/3ML SOLN Take 3 mLs by nebulization every 6 (six) hours as needed.   lisinopril (ZESTRIL) 40 MG tablet Take 40 mg by mouth daily.   lovastatin (MEVACOR) 40 MG tablet Take 40 mg by mouth at bedtime.   olmesartan (BENICAR) 40 MG tablet Take 1 tablet (40 mg total) by mouth daily.   omeprazole (PRILOSEC) 20 MG capsule TAKE ONE CAPSULE BY MOUTH EVERY MORNING BEFORE BREAKFAST for 30   oxybutynin (DITROPAN) 5 MG tablet Take 5 mg by mouth 3 (three) times daily.    TRELEGY ELLIPTA 100-62.5-25  MCG/ACT AEPB Inhale 1 puff into the lungs daily.   No current facility-administered medications on file prior to visit.     No Known Allergies  Review Of Systems:  Constitutional:   No  weight loss, ++night sweats,  +Fevers, chills, +fatigue, or  lassitude.  HEENT:   No headaches,  Difficulty swallowing,  Tooth/dental problems, or  Sore throat,                No sneezing, itching, ear ache, nasal congestion, post nasal drip,   CV:  No chest pain,  Orthopnea, PND, swelling in lower extremities, anasarca, dizziness, palpitations, syncope.   GI  No heartburn, indigestion, abdominal pain, nausea, vomiting, diarrhea, change in bowel habits, loss of appetite, bloody stools.   Resp: + shortness of breath with exertion or at rest.  + excess mucus, no productive cough,  No non-productive cough,  No coughing up of blood.  No change in color of mucus.  No wheezing.  No chest wall deformity  Skin: no rash or lesions.  GU: no dysuria, change in color of urine, no urgency or frequency.  No flank pain, no hematuria   MS:  No joint pain or swelling.  No decreased range of motion.  No back pain.  Psych:  No change in mood or affect. No depression or ++anxiety.  No memory loss.   Vital Signs BP 120/67 (BP Location: Left Arm, Patient Position: Sitting, Cuff Size: Normal)   Pulse (!) 51   Wt 139 lb 3.2 oz (63.1 kg)   SpO2 97%   BMI 21.80 kg/m    Physical Exam:  General- Moderate  distress,  A&Ox3, states she is short of breath, and tired, diaphoretic  ENT: No sinus tenderness, TM clear, pale nasal mucosa, no oral exudate,no post nasal drip, no LAN Cardiac: S1, S2, Irregular rate and rhythm, no murmur Chest: No wheeze/ rales/ dullness; + moderate  accessory muscle use, no nasal flaring, no sternal retractions, breath sounds diminished bilaterally Abd.: Soft Non-tender, ND, BS +, Body mass index is 21.8 kg/m.  Ext: No clubbing cyanosis, edema Neuro: physical deconditioning, MAE x 4, A&O x  3 Skin: No rashes, warm and dry, no lesions  Psych: Anxious    Assessment/Plan Dyspnea despite adequate oxygen saturations on RA Diaphoretic Irregular pulse / tachy then brady Plan EKG now>> A fib with RVR rate 50-150 Oxygen at 2 L [place] for comfort  CXR stat now We will be transporting you to [facility] for evaluation of your irregular heart rate.  We will call 911 and have you transported by a paramedic We will follow up regarding your lung biopsy once you are recovered from your current heart issues. Please contact office for sooner follow up if symptoms do not improve or worsen or seek emergency care    I spent 45 minutes dedicated to the care of this patient on the date of this encounter to include pre-visit review of records, face-to-face time with the patient discussing conditions above, post visit ordering of testing, clinical documentation with the electronic health record, making appropriate referrals as documented, and communicating necessary information to the patient's healthcare team.     Bevelyn Ngo, NP 07/16/2022  10:41 AM

## 2022-07-16 NOTE — H&P (Signed)
Date: 07/16/2022               Patient Name:  Holly May MRN: [id number]  DOB: 04-27-1950 Age / Sex: 72 y.o., female   PCP: Alvina Filbert, MD         Medical Service: Internal Medicine Teaching Service         Attending Physician: Dr. Dickie La, MD      First Contact: Dr. Morene Crocker, MD Pager [phone]    Second Contact: Dr. 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 for follow up sent to the ED for dyspnea and tachycardia  She was at her outpatient's pulmonologist's office today to review biopsy results and was more short of breath than usual. She states that she has been short of breath for the last 3 days, which made her have difficulty sleeping. She also notes that she has had a productive cough during this time and has been taking mucinex. She has had night sweats for months (unchanged), but denies any fevers, chills, chest pain. She states that she has felt a funny feeling in her chest, described like palpitations. The funny feeling in her chest has been ongoing for the last month but worse for the past 3 days.  Patient recently underwent bronchoscopy 07/09/2022. Patient felt well immediately after procedure but has continued to feel worse, stating she wishes she could have oxygen at home as has not been able to ambulate for persistent dyspnea over the past 3 days.   She had dizziness when she changed position, felt like her vision was blurry. She has not been eating/drinking much the past few days. She has not had an appetite. She endorses persistent cramping in legs, but  not in arms or face.   Last BM was yesterday, was normal but small. Has to use suppositories on occasion for constipation. Denies any urinary symptoms like dysuria, hematuria. Also denies any confusion, speech changes. Patient does not have much of an appetite and has decreased her PO intake for the past few weeks. Denies recent travel, no recent surgery aside from lung bx, but at home is mostly in bed/on couch.   ED Course: At pulmonologist office, EKG was read as atrial fibrillation with RVR and chest XR  with known spiculated nodule in LUL on lateral view and without acute cardiopulmonary process. Patient was sent to ED via EMS. On arrival, patient with SOB and tachycardia. EKG reviewed with cardiologist; did not favor atrial fibrillation but atrial tachycardia. Patient was tachycardic, K  2.7, and normal Mg. Cr 1.19 and GFR 49. TSH normal. Patient was admitted to the hospital for electrolyte repletion and cardiac monitoring.   ED Course:  HTN HLD Depression COPD  Peripheral neuropathy Overactive bladder  Meds: Pill pack [facility] in [place], confirmed with outpatient pharmacy Albuterol PRN Alendronate 70 mg once week Amlodipine 5 mg daily Celexa 20 mg daily Gabapentin 300 qhs HCTZ 25 mg daily Duoneb PRN Lisinopril 40 mg daily Lovastatin 40 mg daily Omeprazole 20 mg BID Oxybutynin 5 mg TID Trelegy 1 puff daily  -Olmesartan prescription prescribed  by Dr. Loura Back in pulmonology office also included in pill pack for the past 3  months.    Current Meds  Medication Sig   albuterol (PROVENTIL HFA;VENTOLIN HFA) 108 (90 Base) MCG/ACT inhaler Inhale 2 puffs into the lungs every 4 (four) hours as needed for wheezing or shortness of breath (cough, shortness of breath or wheezing.). (Patient taking differently: Inhale 2 puffs into the lungs every 4 (four) hours as needed for wheezing or shortness of breath (cough).)   alendronate (FOSAMAX) 70 MG tablet Take 70 mg by mouth once  a week.   amLODipine (NORVASC) 5 MG tablet Take 5 mg by mouth daily.   citalopram (CELEXA) 20 MG tablet Take 1 tablet (20 mg total) by mouth daily.   gabapentin (NEURONTIN) 300 MG capsule Take 300 mg by mouth daily.   hydrochlorothiazide (HYDRODIURIL) 25 MG tablet Take 25 mg by mouth daily.   ipratropium-albuterol (DUONEB) 0.5-2.5 (3) MG/3ML SOLN Take 3 mLs by nebulization every 6 (six) hours as needed (wheezing, shortness of breath).   lisinopril (ZESTRIL) 40 MG tablet Take 40 mg by mouth daily.   lovastatin (MEVACOR) 40 MG tablet Take 40 mg by mouth daily.   omeprazole (PRILOSEC) 20 MG capsule Take 20 mg by mouth daily.   oxybutynin (DITROPAN) 5 MG tablet Take 5 mg by mouth 3 (three) times daily.    TRELEGY ELLIPTA 100-62.5-25 MCG/ACT AEPB Inhale 1 puff into the lungs daily.    Past Medical History  Past Surgical History:  Procedure Laterality Date   BRONCHIAL BIOPSY  07/09/2022   Procedure: BRONCHIAL BIOPSIES;  Surgeon: Josephine Igo, DO;  Location: [facility];  Service: Pulmonary;;   BRONCHIAL BRUSHINGS  07/09/2022   Procedure: BRONCHIAL BRUSHINGS;  Surgeon: Josephine Igo, DO;  Location: [facility];  Service: Pulmonary;;   BRONCHIAL NEEDLE ASPIRATION BIOPSY  07/09/2022   Procedure: BRONCHIAL NEEDLE ASPIRATION BIOPSIES;  Surgeon: Josephine Igo, DO;  Location: [facility];  Service: Pulmonary;;   CHOLECYSTECTOMY     FIDUCIAL MARKER PLACEMENT  07/09/2022   Procedure: FIDUCIAL MARKER PLACEMENT;  Surgeon: Josephine Igo, DO;  Location: [facility];  Service: Pulmonary;;   FRACTURE SURGERY Left    arm   OPEN REDUCTION INTERNAL FIXATION (ORIF) PROXIMAL PHALANX Left 04/11/2017   Procedure: OPEN REDUCTION INTERNAL FIXATION (ORIF) PROXIMAL PHALANX;  Surgeon: Dairl Ponder, MD;  Location: [facility];  Service: Orthopedics;  Laterality: Left;   RESECTION OF MEDIASTINAL MASS N/A 02/18/2019   Procedure: RESECTION OF MEDIASTINAL MASS;  Surgeon: Kerin Perna, MD;   Location[facility] OR;  Service: Thoracic;  Laterality: N/A;   STERNOTOMY N/A 02/18/2019   Procedure: STERNOTOMY;  Surgeon: Kerin Perna, MD;  Location: [facility] OR;  Service: Thoracic;  Laterality: N/A;   VIDEO BRONCHOSCOPY WITH ENDOBRONCHIAL ULTRASOUND Bilateral 07/09/2022   Procedure: VIDEO BRONCHOSCOPY WITH ENDOBRONCHIAL ULTRASOUND;  Surgeon: Josephine Igo, DO;  Location: [facility];  Service: Pulmonary;  Laterality: Bilateral;    Social:  Lives With: son in [place] Support: son, sister  Level of Function: independent of ADLs, iADLs. Drives, cooks, pays her own bills; does not ambulate with assistive device PCP: Alvina Filbert, MD in [place] (sees every 6 months) Substances: quit smoking 3 days ago, was smoking 1 pack & a piece a day for 50 years; quit drinking alcohol on October 02, 2021. No illicit substance use  Family History:  Hypertension, diabetes  Allergies: Allergies as of 07/16/2022   (No Known Allergies)    Review of Systems: A  complete ROS was negative except as per HPI.   OBJECTIVE:   Physical Exam: Vitals:   07/16/22 1715 07/16/22 1730  BP: 119/70 117/67  Pulse: 94   Resp: (!) 24 18  Temp:    SpO2: 100% 99%    Constitutional: Chronically ill-appearing woman laying in bed with mild respiratory distress HENT: normocephalic atraumatic, edentulous with moist mucus membrane Eyes: conjunctiva non-erythematous Neck: supple, no JVD on exam Cardiovascular: Difficult cardiac examination secondary to labored respiratory sounds; able to appreciate tachycardia Pulmonary/Chest: increased work of breathing, pursed lip breathing lungs with clear air exchange with inspiration. Difficult to appreciate expiratory phase of respiration secondary to respiratory sounds Abdominal: soft, non-tender, non-distended MSK: normal bulk and tone Neurological: alert & oriented x 3, mild difficulty understanding speech because lack of dentures but responding to questions appropriately.  5/5 strength in bilateral upper and lower extremities Skin: warm and dry Psych: Pleasant mood and affect  Labs: CBC    Component Value Date/Time   WBC 21.3 (H) 07/16/2022 1240   RBC 3.88 07/16/2022 1240   HGB 13.0 07/16/2022 1240   HCT 37.7 07/16/2022 1240   PLT 271 07/16/2022 1240   MCV 97.2 07/16/2022 1240   MCH 33.5 07/16/2022 1240   MCHC 34.5 07/16/2022 1240   RDW 14.4 07/16/2022 1240   LYMPHSABS 4.1 (H) 07/16/2022 1240   MONOABS 2.0 (H) 07/16/2022 1240   EOSABS 0.3 07/16/2022 1240   BASOSABS 0.1 07/16/2022 1240     CMP     Component Value Date/Time   NA 135 07/16/2022 1240   K 2.7 (LL) 07/16/2022 1240   CL 96 (L) 07/16/2022 1240   CO2 26 07/16/2022 1240   GLUCOSE 147 (H) 07/16/2022 1240   BUN 37 (H) 07/16/2022 1240   CREATININE 1.19 (H) 07/16/2022 1240   CALCIUM 8.8 (L) 07/16/2022 1240   PROT 7.3 07/28/2021 1147   ALBUMIN 3.5 07/28/2021 1147   AST 17 07/28/2021 1147   ALT 10 07/28/2021 1147   ALKPHOS 88 07/28/2021 1147   BILITOT 0.6 07/28/2021 1147   GFRNONAA 49 (L) 07/16/2022 1240   GFRAA >60 02/21/2019 0502    Imaging: CT Angio Chest Pulmonary Embolism (PE) W or WO Contrast  Result Date: 07/16/2022 CLINICAL DATA:  Shortness of breath. EXAM: CT ANGIOGRAPHY CHEST WITH CONTRAST TECHNIQUE: Multidetector CT imaging of the chest was performed using the standard protocol during bolus administration of intravenous contrast. Multiplanar CT image reconstructions and MIPs were obtained to evaluate the vascular anatomy. RADIATION DOSE REDUCTION: This exam was performed according to the departmental dose-optimization program which includes automated exposure control, adjustment of the mA and/or kV according to patient size and/or use of iterative reconstruction technique. CONTRAST:  75mL OMNIPAQUE IOHEXOL 350 MG/ML SOLN COMPARISON:  X-ray earlier 07/16/2022. Prior CT scan 06/26/2022. PET-CT 05/16/2022 FINDINGS: Cardiovascular: No pulmonary embolism identified. Patient is  status post median sternotomy. The heart is nonenlarged. No pericardial effusion. Coronary artery calcifications are noted. The thoracic aorta has scattered partially calcified atherosclerotic plaque. Slightly tortuous descending thoracic aorta. Mediastinum/Nodes: Small thyroid gland. Slightly patulous thoracic esophagus. Small hiatal hernia. No specific abnormal lymph node enlargement identified in the axillary regions, left hilum. Prominent right hilar nodes identified. Example series 6, image 81 measures 19 x 15 mm. This better seen on today's study with contrast. Small mediastinal nodes are seen, nonpathologic by size criteria. Lungs/Pleura: There is a opacity seen along the right lower lobe bronchus with narrowing and debris. There is some consolidative opacity in the right lower lobe.  Infection is one possibility. This was not seen on the prior exam. No other areas of consolidation. No pneumothorax or effusion. Spiculated left upper lobe nodule again identified with an adjacent presumed fiducial marker. Please correlate with recent bronchoscopy. Underlying emphysematous lung changes, centrilobular. Upper Abdomen: Adrenal glands are preserved in the upper abdomen. Previous cholecystectomy. Musculoskeletal: Scattered degenerative changes of the spine. There is compression deformities identified at multiple levels, greatest at T11 but also T9, T8, T5. Multifocal Schmorl's node changes. These are unchanged from the prior CT scan. Associated sclerosis seen with a compression of T11. Review of the MIP images confirms the above findings. IMPRESSION: No pulmonary embolism identified. Developing parenchymal opacity along the right lower lobe with some debris along associated right lower lobe bronchi. Acute infiltrate is possible. Recommend follow-up to confirm clearance. This was not seen previously. Underlying emphysematous lung changes seen. Stable spiculated left upper lobe nodule. Please correlate with recent  workup. Few enlarged right hilar lymph nodes. Not well seen on the prior but the previous examination was without contrast. This also can be assessed on short follow-up. Stable multifocal compression deformities of the spine Aortic Atherosclerosis (ICD10-I70.0) and Emphysema (ICD10-J43.9). Electronically Signed   By: Karen Kays M.D.   On: 07/16/2022 17:45   DG Chest 2 View  Result Date: 07/16/2022 CLINICAL DATA:  Atrial fibrillation.  Congestion. EXAM: CHEST - 2 VIEW COMPARISON:  Radiographs 07/09/2022 and 07/28/2021.  CT 06/26/2022. FINDINGS: The heart size and mediastinal contours are stable status post median sternotomy. There is diffuse aortic and coronary artery atherosclerosis. A previously demonstrated spiculated nodule posteriorly in the left upper lobe is faintly visible on the lateral view with an apparent new adjacent biopsy clip. No airspace disease, edema, pleural effusion or pneumothorax identified. Thoracolumbar scoliosis and multiple thoracic compression deformities are grossly stable from previous CT. No acute osseous findings are seen. IMPRESSION: 1. No evidence of acute cardiopulmonary process. 2. Previously demonstrated spiculated nodule in the left upper lobe is faintly visible on the lateral view with apparent new adjacent biopsy clip. Correlate with recent biopsy results. Electronically Signed   By: Carey Bullocks M.D.   On: 07/16/2022 11:31     EKG: personally reviewed my interpretation irregular supraventricular tachycardia with multiple p wave morphologies consistent with multifocal atrial tachycardia vs sinus tachycardia with multiple PACs, prolonged Qtc, and LVH compared to prior EKG on 07/09/2022  ASSESSMENT & PLAN:   Assessment & Plan by Problem: Principal Problem:   Sepsis Active Problems:   Hypokalemia   Holly May is a 72 yo female with T2DM, HTN, osteoporosis, tobacco use disorder, severe COPD, overactive bladder, recently diagnosed with bronchogenic  adenocarcinoma per 07/09/2022 biopsy admitted to the hospital for hypokalemia and found to have worsening dyspnea on exertion   Sepsis RLL pneumonia COPD exacerbation Patient with increased cough, sputum production, tachycardic, dyspneic with increased O2 requirement, WBC 21.3K, with new AKI and evidence of RLL consolidation and debris on CT PE likely 2/2 to recent bronchoscopy vs post obstruction in setting of bronchogenic carcinoma. No fever or hypotension. -F/u sputum and blood cultures -Start CTX and azithromycin  -Start 5 day course of prednisone  -PRN nebulizers -O2 saturation goal 89-93% -Monitor VS -s/p 1.5 fluid bolus  Tachycardia Worsening dyspnea Patient with worsening SOB and DOE and persistent palpitations in the past week associated with worsening couch and sputum production. Initially thought 2/2 to afib with RVR however with further evaluation EKG consistent with mutitifocal atrial tachycardia vs sinus tachycardia with multiple PAC  and LVH concerning for heart strain.  Initial differential diagnoses included hypovolemia vs PE vs pneumonia vs structural heart abnormalities in the setting of long standing COPD. Initial CXR without acute cardiopulmonary abnormality. However, given that Well's PE 4.0, moderate risk for PE, CT PE protocol was obtained. Results without PE but with possible RLL PNA. 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 Normal magnesium at 1.9. Patient received 4x runs of K in the ED. Unlikely to be tumor lysis syndrome; phosphorus added for completion. Urinary K sent. Patient with EKG changes - Added 40 mEq of K PO BID for 2 doses -Cardiac telemetry -F/u urine studies  AKI Patient with Cr 1.19 on admission. Likely  prerenal with suspected sepsis and poor po intake. Will trial fluid resuscitation and follow AM labs -Monitor renal function  Lung adenocarcinoma Current tobacco use PET scan on 05/16/2022 with spiculated 11 mm LUL pulmonary nodule now s/p bronchoscopy on 07/09/2022 revealing NSCLC with adenocarcinoma. Patient was to follow up with pulmonology today but given worsening DOE and tachycardia sent to ED -Will need follow up outpatient   HTN HLD Did not continue antihypertensives as patient has been normotensive since admission -Continue pravastatin 40 mg daily -Consider restarting BP meds if hypertensive  Overactive bladder Continue Oxybutynin 5 mg TID  GERD Transitioned to pantoprazole 80 mg as patient takes Prilosec 20 mg BID  Depression -Continue on Celexa  Osteoporosis Patient on weekly alendronate, unsure when last dose was. Will need to clarify prior to discharge   Diet: Heart Healthy VTE: Enoxaparin IVF: LR,Bolus Code: Full  Prior to Admission Living Arrangement: Home, living son Anticipated Discharge Location:  pending   Dispo: Admit patient to Observation with expected length of stay less than 2 midnights.  Signed: Morene Crocker, MD Internal Medicine Resident PGY-1  07/16/2022, 6:33 PM

## 2022-07-17 ENCOUNTER — Observation Stay (HOSPITAL_BASED_OUTPATIENT_CLINIC_OR_DEPARTMENT_OTHER): Payer: Medicare HMO

## 2022-07-17 ENCOUNTER — Telehealth: Payer: Self-pay | Admitting: Acute Care

## 2022-07-17 DIAGNOSIS — J441 Chronic obstructive pulmonary disease with (acute) exacerbation: Secondary | ICD-10-CM

## 2022-07-17 DIAGNOSIS — R9431 Abnormal electrocardiogram [ECG] [EKG]: Secondary | ICD-10-CM | POA: Diagnosis not present

## 2022-07-17 DIAGNOSIS — E876 Hypokalemia: Secondary | ICD-10-CM

## 2022-07-17 DIAGNOSIS — I4719 Other supraventricular tachycardia: Secondary | ICD-10-CM

## 2022-07-17 DIAGNOSIS — J189 Pneumonia, unspecified organism: Secondary | ICD-10-CM

## 2022-07-17 LAB — CBC
HCT: 36.7 % (ref 36.0–46.0)
Hemoglobin: 11.8 g/dL — ABNORMAL LOW (ref 12.0–15.0)
MCH: 32.7 pg (ref 26.0–34.0)
MCHC: 32.2 g/dL (ref 30.0–36.0)
MCV: 101.7 fL — ABNORMAL HIGH (ref 80.0–100.0)
Platelets: 155 10*3/uL (ref 150–400)
RBC: 3.61 MIL/uL — ABNORMAL LOW (ref 3.87–5.11)
RDW: 14.4 % (ref 11.5–15.5)
WBC: 13.8 10*3/uL — ABNORMAL HIGH (ref 4.0–10.5)
nRBC: 0 % (ref 0.0–0.2)

## 2022-07-17 LAB — COMPREHENSIVE METABOLIC PANEL
ALT: 12 U/L (ref 0–44)
AST: 18 U/L (ref 15–41)
Albumin: 2.7 g/dL — ABNORMAL LOW (ref 3.5–5.0)
Alkaline Phosphatase: 52 U/L (ref 38–126)
Anion gap: 10 (ref 5–15)
BUN: 21 mg/dL (ref 8–23)
CO2: 21 mmol/L — ABNORMAL LOW (ref 22–32)
Calcium: 8.2 mg/dL — ABNORMAL LOW (ref 8.9–10.3)
Chloride: 104 mmol/L (ref 98–111)
Creatinine, Ser: 0.88 mg/dL (ref 0.44–1.00)
GFR, Estimated: >60 mL/min (ref >60)
Glucose, Bld: 184 mg/dL — ABNORMAL HIGH (ref 70–99)
Potassium: 4.4 mmol/L (ref 3.5–5.1)
Sodium: 135 mmol/L (ref 135–145)
Total Bilirubin: 0.9 mg/dL (ref 0.3–1.2)
Total Protein: 5.5 g/dL — ABNORMAL LOW (ref 6.5–8.1)

## 2022-07-17 LAB — HIV ANTIBODY (ROUTINE TESTING W REFLEX): HIV Screen 4th Generation wRfx: NONREACTIVE

## 2022-07-17 LAB — ECHOCARDIOGRAM COMPLETE
Area-P 1/2: 3.42 cm2
Calc EF: 47.8 %
Height: 67 in
Single Plane A2C EF: 53.6 %
Single Plane A4C EF: 39.7 %
Weight: 2224 oz

## 2022-07-17 LAB — MAGNESIUM: Magnesium: 1.7 mg/dL (ref 1.7–2.4)

## 2022-07-17 LAB — CULTURE, BLOOD (ROUTINE X 2): Culture: NO GROWTH

## 2022-07-17 MED ORDER — AMOXICILLIN-POT CLAVULANATE 875-125 MG PO TABS: 1.0000 | ORAL_TABLET | Freq: Two times a day (BID) | ORAL | 0 refills | Status: AC

## 2022-07-17 MED ORDER — AZITHROMYCIN 500 MG PO TABS: 500.0000 mg | ORAL_TABLET | Freq: Every day | ORAL | 0 refills | Status: AC

## 2022-07-17 MED ORDER — PREDNISONE 20 MG PO TABS: 40.0000 mg | ORAL_TABLET | Freq: Every day | ORAL | 0 refills | Status: AC

## 2022-07-17 NOTE — Telephone Encounter (Signed)
Left VM for patient to call to schedule appt with Kandice Robinsons, NP for review of results.  Patient can be added to Wednesday 07/24/22 pm schedule.

## 2022-07-17 NOTE — ED Notes (Signed)
Walked patient around the nursing station patient oxygen level stayed at 98 room air but patient was wheezing the whole time we was walking

## 2022-07-17 NOTE — Discharge Summary (Addendum)
Name: Holly May MRN: [id number] DOB: 1951/03/18 72 y.o. PCP: Alvina Filbert, MD  Date of Admission: 07/16/2022 12:06 PM Date of Discharge: 07/17/2022 11:27 AM Attending Physician: Dr. Sol Blazing  Discharge Diagnosis: Principal Problem:   Sepsis Active Problems:   Hypokalemia    Discharge Medications: Allergies as of 07/17/2022   No Known Allergies      Medication List     STOP taking these medications    hydrochlorothiazide 25 MG tablet Commonly known as: HYDRODIURIL   olmesartan 40 MG tablet Commonly known as: BENICAR       TAKE these medications    albuterol 108 (90 Base) MCG/ACT inhaler Commonly known as: VENTOLIN HFA Inhale 2 puffs into the lungs every 4 (four) hours as needed for wheezing or shortness of breath (cough, shortness of breath or wheezing.). What changed: reasons to take this   alendronate 70 MG tablet Commonly known as: FOSAMAX Take 70 mg by mouth once a week. Wednesday   amLODipine 5 MG tablet Commonly known as: NORVASC Take 5 mg by mouth daily.   amoxicillin-clavulanate 875-125 MG tablet Commonly known as: AUGMENTIN Take 1 tablet by mouth 2 (two) times daily for 4 days.   azithromycin 500 MG tablet Commonly known as: ZITHROMAX Take 1 tablet (500 mg total) by mouth daily for 2 doses.   citalopram 20 MG tablet Commonly known as: CELEXA Take 1 tablet (20 mg total) by mouth daily.   gabapentin 300 MG capsule Commonly known as: NEURONTIN Take 300 mg by mouth daily.   ipratropium-albuterol 0.5-2.5 (3) MG/3ML Soln Commonly known as: DUONEB Take 3 mLs by nebulization every 6 (six) hours as needed (wheezing, shortness of breath).   lisinopril 40 MG tablet Commonly known as: ZESTRIL Take 40 mg by mouth daily.   lovastatin 40 MG tablet Commonly known as: MEVACOR Take 40 mg by mouth daily.   omeprazole 20 MG capsule Commonly known as: PRILOSEC Take 20 mg by mouth daily.   oxybutynin 5 MG tablet Commonly known as: DITROPAN Take  5 mg by mouth 3 (three) times daily.   predniSONE 20 MG tablet Commonly known as: DELTASONE Take 2 tablets (40 mg total) by mouth daily with breakfast for 4 doses.   Trelegy Ellipta 100-62.5-25 MCG/ACT Aepb Generic drug: Fluticasone-Umeclidin-Vilant Inhale 1 puff into the lungs daily.        Disposition and follow-up:   Holly May was discharged from [facility] in [place] condition.  At the hospital follow up visit please address:  1.  Follow-up:  *RLL pneumonia *COPD exacerbation -Ensure completion of antibiotic therapy and steroid course -Ensure improvement of symptoms  *Hypokalemia -Discontinued HCTZ at discharge  -Montior K levels at follow up  *HTN -Monitor BP and manage antihypertensive outpatient -Ensure correct medications are included in pill packets sent to pharmacy  *Tachycardia *frequent PAC -Follow up symptom resolutions -Recommend outpatient cardiology referral   2.  Labs / imaging needed at time of follow-up: BMP  3.  Pending labs/ test needing follow-up: Blood cultures  4.  Medication Changes  STOPPED Hydrochlorothiazide Olmesartan  ADDED Azithromycin - take 500 mg daily - 4/17 and in 4/18 Augmentin q8 hours starting today until 4/20/204 Prednisone - take 40 mg once daily until 07/20/2022  Follow-up Appointments: Dr. Sherene Sires in pulmonology 08/21/2022 - 10AM at [facility] Pulmonologist Patient to follow with PCP in 1-2 weeks  Hospital Course by problem list: Holly May is 72 yo F with recent NSCLC diagnosis, current tobacco use disorder, COPD, HTN,  T2DM sent to ED via EMS from Pulmonologist office for increased WOB and irregular rhythm on EKG admitted to the hospital for hypokalemia and concern for high risk of sepsis found to have RLL pneumonia and COPD exacerbation. She is now being discharged from the hospital after appropriate treatment with antibiotics, steroids, and electrolyte replacement  RLL pneumonia COPD  exacerbation Patient with increased cough, sputum production, and worsening DOE at admission. Transient O2 requirement and tachycardia on admission. Patient with leukocytosis to 21K on admission and evidence of RLL consolidation on CT PE likely in the setting of aspiration vs post recent bronchoscopy. Patient was started on CTX, azithromycin, and steroids for PNA and ACOPDE with improvement in dyspnea, good oxygen saturations at rest and with ambulation, and in leukocytosis. Patient is stable and will be discharged to home to complete 5-day course of antibiotics for CAP and prednisone course. Instructed patient to use nebulized treatments every 6 hours in the next two days to help with wheezing.   Tachycardia Patient presenting with supraventricular tachycardia with frequent PACs. Patient was placed on cardiac telemetry, troponins were flat, ruled out PE on CT angio chest, and gave fluids as patient endorsed poor po intake for the past few days.  Patient also underwent TTE to look for potential structural abnormalities; LV 50-55% with low normal function. No valvular disease or concern for acute heart failure exacerbation. Tachycardia resolved after fluid resuscitation and electrolyte repletion. However, recommend cardiology referral in the outpatient setting for continued monitoring.   Hypokalemia K 2.7 on admission. Urine K 24. 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 Patient's serum potassium was supplemented and was 4.4 prior to discharge. HCTZ was discontinued; patient's pharmacy called to remove from pill pack. Follow up outpatient BMP to consider re initiation.  Lung adenocarcinoma Current tobacco use PET scan on 05/16/2022 with spiculated 11 mm LUL pulmonary nodule now s/p bronchoscopy on 07/09/2022 revealing adenocarcinoma. Patient was to follow up with pulmonology prior to  admission. Follow up with Dr.Wert was scheduled prior to discharge.  HTN Medication management Upon chart review, patient was on Olmesartan and Lisinopril therapy in her pill pack for the past 3 months. Olmesartan therapy was discontinued. Would recommend medication reconciliation through [facility] [place], [place] as she gets medications in blister packs as she has eye-sight limitations. Patient was continued on prior Lisinopril dose; monitor BP and titrate as needed at follow up.  Elevated creatinine Cr 1.19 from baseline resolved after fluid resuscitation.  Discharge Subjective: Patient reports that her breathing has improved. Feeling much better today. Still has some cough. Patient is hungry and would like a better breakfast.   Discharge Exam:   Blood pressure (!) 158/106, pulse 70, temperature 98.9 F (37.2 C), temperature source Oral, resp. rate 14, height 5\' 7"  (1.702 m), weight 63 kg, SpO2 100 %.  Constitutional:Chronically ill-appearing woman sitting in bed, in no acute distress HENT: normocephalic atraumatic, mucous membranes moist Cardiovascular: regular rate and rhythm, no m/r/g Pulmonary/Chest: normal work of breathing on room air, good inspiratory lung sounds, with crackles appreciated on the RLL and expiratory wheezing Abdominal: soft, non-tender, non-distended Neurological: alert & oriented x 3 MSK: no gross abnormalities. No pitting edema Skin: warm and dry Psych: Normal mood and affect  Pertinent Labs, Studies, and Procedures:     Latest Ref Rng & Units 07/17/2022    4:55 AM 07/16/2022   12:40 PM 07/09/2022  8:48 AM  CBC  WBC 4.0 - 10.5 K/uL 13.8  21.3  13.6   Hemoglobin 12.0 - 15.0 g/dL 16.1  09.6  04.5   Hematocrit 36.0 - 46.0 % 36.7  37.7  41.9   Platelets 150 - 400 K/uL 155  271  244        Latest Ref Rng & Units 07/17/2022    4:55 AM 07/16/2022   12:40 PM 07/09/2022    8:48 AM  CMP  Glucose 70 - 99 mg/dL 409  811  914   BUN 8 - 23 mg/dL 21  37   23   Creatinine 0.44 - 1.00 mg/dL 7.82  9.56  2.13   Sodium 135 - 145 mmol/L 135  135  134   Potassium 3.5 - 5.1 mmol/L 4.4  2.7  3.1   Chloride 98 - 111 mmol/L 104  96  99   CO2 22 - 32 mmol/L 21  26  25    Calcium 8.9 - 10.3 mg/dL 8.2  8.8  8.8   Total Protein 6.5 - 8.1 g/dL 5.5     Total Bilirubin 0.3 - 1.2 mg/dL 0.9     Alkaline Phos 38 - 126 U/L 52     AST 15 - 41 U/L 18     ALT 0 - 44 U/L 12       CT Angio Chest Pulmonary Embolism (PE) W or WO Contrast  Result Date: 07/16/2022 CLINICAL DATA:  Shortness of breath. EXAM: CT ANGIOGRAPHY CHEST WITH CONTRAST TECHNIQUE: Multidetector CT imaging of the chest was performed using the standard protocol during bolus administration of intravenous contrast. Multiplanar CT image reconstructions and MIPs were obtained to evaluate the vascular anatomy. RADIATION DOSE REDUCTION: This exam was performed according to the departmental dose-optimization program which includes automated exposure control, adjustment of the mA and/or kV according to patient size and/or use of iterative reconstruction technique. CONTRAST:  75mL OMNIPAQUE IOHEXOL 350 MG/ML SOLN COMPARISON:  X-ray earlier 07/16/2022. Prior CT scan 06/26/2022. PET-CT 05/16/2022 FINDINGS: Cardiovascular: No pulmonary embolism identified. Patient is status post median sternotomy. The heart is nonenlarged. No pericardial effusion. Coronary artery calcifications are noted. The thoracic aorta has scattered partially calcified atherosclerotic plaque. Slightly tortuous descending thoracic aorta. Mediastinum/Nodes: Small thyroid gland. Slightly patulous thoracic esophagus. Small hiatal hernia. No specific abnormal lymph node enlargement identified in the axillary regions, left hilum. Prominent right hilar nodes identified. Example series 6, image 81 measures 19 x 15 mm. This better seen on today's study with contrast. Small mediastinal nodes are seen, nonpathologic by size criteria. Lungs/Pleura: There is a  opacity seen along the right lower lobe bronchus with narrowing and debris. There is some consolidative opacity in the right lower lobe. Infection is one possibility. This was not seen on the prior exam. No other areas of consolidation. No pneumothorax or effusion. Spiculated left upper lobe nodule again identified with an adjacent presumed fiducial marker. Please correlate with recent bronchoscopy. Underlying emphysematous lung changes, centrilobular. Upper Abdomen: Adrenal glands are preserved in the upper abdomen. Previous cholecystectomy. Musculoskeletal: Scattered degenerative changes of the spine. There is compression deformities identified at multiple levels, greatest at T11 but also T9, T8, T5. Multifocal Schmorl's node changes. These are unchanged from the prior CT scan. Associated sclerosis seen with a compression of T11. Review of the MIP images confirms the above findings. IMPRESSION: No pulmonary embolism identified. Developing parenchymal opacity along the right lower lobe with some debris along associated right lower lobe bronchi. Acute infiltrate is  possible. Recommend follow-up to confirm clearance. This was not seen previously. Underlying emphysematous lung changes seen. Stable spiculated left upper lobe nodule. Please correlate with recent workup. Few enlarged right hilar lymph nodes. Not well seen on the prior but the previous examination was without contrast. This also can be assessed on short follow-up. Stable multifocal compression deformities of the spine Aortic Atherosclerosis (ICD10-I70.0) and Emphysema (ICD10-J43.9). Electronically Signed   By: Karen Kays M.D.   On: 07/16/2022 17:45   DG Chest 2 View  Result Date: 07/16/2022 CLINICAL DATA:  Atrial fibrillation.  Congestion. EXAM: CHEST - 2 VIEW COMPARISON:  Radiographs 07/09/2022 and 07/28/2021.  CT 06/26/2022. FINDINGS: The heart size and mediastinal contours are stable status post median sternotomy. There is diffuse aortic and  coronary artery atherosclerosis. A previously demonstrated spiculated nodule posteriorly in the left upper lobe is faintly visible on the lateral view with an apparent new adjacent biopsy clip. No airspace disease, edema, pleural effusion or pneumothorax identified. Thoracolumbar scoliosis and multiple thoracic compression deformities are grossly stable from previous CT. No acute osseous findings are seen. IMPRESSION: 1. No evidence of acute cardiopulmonary process. 2. Previously demonstrated spiculated nodule in the left upper lobe is faintly visible on the lateral view with apparent new adjacent biopsy clip. Correlate with recent biopsy results. Electronically Signed   By: Carey Bullocks M.D.   On: 07/16/2022 11:31     Discharge Instructions: Discharge Instructions     Diet - low sodium heart healthy   Complete by: As directed    Discharge instructions   Complete by: As directed    Dear Holly May  You were admitted to the hospital for worsening in your breathing, fast heart rate, and low potassium in your blood. You are being treated for a respiratory infection called pneumonia. Because your breathing is much better and your heart rate has improved, you are stable to be discharged from the hospital.  -Make sure you do your breathing treatments (nebulized) every 6 hours. It is important that you take the following medications in the short term  MEDICATION CHANGES Go to your pharmacy with the pill packs you have at home. Your pharmacist will take out the medications that you should stop taking and give you 3 bottles with new medications that you will take for a short period of time  STOP: -You should stop taking hydrochlorothiazide and olmesartan  NEW MEDICATIONS starting tonight 07/17/2022:  -Azithromycin - take 500 mg once per day on 4/17 and in 4/18 -Augmentin - take one table every 8 hours starting today until 4/20/204 -Prednisone - take 40 mg once daily until  07/20/2022  APPOINTMENTS -Make a follow up appointment with Dr. Durene Cal in the next week after your hospital discharge  -You will need to call your lung doctor to make a follow up appointment for your lung cancer biopsy results and next steps for management  Take care, Holly Crocker, MD   Increase activity slowly   Complete by: As directed        Signed: Morene Crocker, MD [facility] - PGY1 Pager: [phone] 07/17/2022, 11:27 AM

## 2022-07-18 ENCOUNTER — Other Ambulatory Visit: Payer: Self-pay

## 2022-07-18 LAB — CULTURE, BLOOD (ROUTINE X 2): Special Requests: ADEQUATE

## 2022-07-18 NOTE — Progress Notes (Signed)
The proposed treatment discussed in conference is for discussion purpose only and is not a binding recommendation.  The patients have not been physically examined, or presented with their treatment options.  Therefore, final treatment plans cannot be decided.  

## 2022-07-19 ENCOUNTER — Encounter: Payer: Self-pay | Admitting: Acute Care

## 2022-07-19 NOTE — Patient Instructions (Addendum)
EKG now>> A fib with RVR rate 50-150 Oxygen at 2 L [place] for comfort  CXR stat now We will be transporting you to [facility] for evaluation of your irregular heart rate.  We will call [phone] and have you transported by a paramedic We will follow up regarding your lung biopsy once you are recovered from your current heart issues. Please contact office for sooner follow up if symptoms do not improve or worsen or seek emergency care

## 2022-07-20 LAB — CULTURE, BLOOD (ROUTINE X 2): Culture: NO GROWTH

## 2022-07-21 LAB — CULTURE, BLOOD (ROUTINE X 2)

## 2022-07-24 ENCOUNTER — Other Ambulatory Visit: Payer: Self-pay

## 2022-07-24 DIAGNOSIS — R911 Solitary pulmonary nodule: Secondary | ICD-10-CM

## 2022-07-26 ENCOUNTER — Encounter: Payer: Self-pay | Admitting: Acute Care

## 2022-07-26 ENCOUNTER — Ambulatory Visit (INDEPENDENT_AMBULATORY_CARE_PROVIDER_SITE_OTHER): Payer: Medicare HMO | Admitting: Acute Care

## 2022-07-26 VITALS — BP 124/76 | HR 58 | Temp 97.9°F | Ht 67.0 in | Wt 145.4 lb

## 2022-07-26 DIAGNOSIS — C3490 Malignant neoplasm of unspecified part of unspecified bronchus or lung: Secondary | ICD-10-CM

## 2022-07-26 DIAGNOSIS — I4891 Unspecified atrial fibrillation: Secondary | ICD-10-CM

## 2022-07-26 DIAGNOSIS — A419 Sepsis, unspecified organism: Secondary | ICD-10-CM

## 2022-07-26 DIAGNOSIS — J449 Chronic obstructive pulmonary disease, unspecified: Secondary | ICD-10-CM | POA: Diagnosis not present

## 2022-07-26 DIAGNOSIS — J189 Pneumonia, unspecified organism: Secondary | ICD-10-CM | POA: Diagnosis not present

## 2022-07-26 DIAGNOSIS — F17210 Nicotine dependence, cigarettes, uncomplicated: Secondary | ICD-10-CM | POA: Diagnosis not present

## 2022-07-26 NOTE — Patient Instructions (Addendum)
It is good to see you today . Your biopsy was positive for cancer. I have referred you to be evaluated for surgery. You will get a call to schedule. I have referred you to radiation oncology. You will get a call to schedule.  I have referred you to cardiology to have your atrial fibrillation followed. This will be in [place]. You will get a call to get this scheduled.  I have also scheduled an MRI brain. You will get a call to get this scheduled.  Please work on quitting smoking. Please call 1-800-QUIT NOW for free nicotine patches gum or mints. Follow up with Dr. Sherene Sires 5/22 at 10 am as follow up to Pneumonia and COPD flare Continue Trelegy 1 puff once daily Rinse mouth after use. Use albuterol 2 puffs as needed for breakthrough shortness of breath or wheezing. Note your daily symptoms > remember "red flags" for COPD:  Increase in cough, increase in sputum production, increase in shortness of breath or activity intolerance. If you notice these symptoms, please call to be seen.

## 2022-07-26 NOTE — Progress Notes (Signed)
History of Present Illness Holly May is a 72 y.o. female current every day smoker  referred to Dr. Tonia Brooms for evaluation and biopsy of a spiculated 11 mm left upper lobe pulmonary nodule that was hypermetabolic on PET imaging and concerning for a primary bronchogenic carcinoma. She underwent Flexible video fiberoptic bronchoscopy with robotic assistance and biopsies on 07/09/2022.  She was initially seen on July 16, 2022 as follow-up after bronc however patient was found to be diaphoretic and dyspneic, EKG showed atrial fib with RVR, and she was admitted to the hospital from the office.  We did not at that point have time to discuss the results of her biopsies.  She is returning today to review biopsy results and to assess recovery from her atrial fibrillation and pneumonia/sepsis which required admission 4/16-4/17 2024.   07/26/2022 Pt. Presents for follow up after bronch on 07/09/2022. She had previously had an appointment on 4/16 with me for bronch follow up, but had to be admitted to the hospital from the office as she was in atrial fibrillation and was very symptomatic with dyspnea and diaphoresis.  By the time she arrived at the hospital she could converted into a sinus tach.  She states she feels much better since admission to the hospital.  She had several electrolyte abnormalities that have been corrected. She also was noted to have a RLL pneumonia/ sepsis while in the hospital.  Blood culture showed no growth .Patient was treated with CTX, azithromycin and prednisone. She complete the last few days of antibiotic therapy after discharge Azithromycin - 500 mg daily - 4/17 and  4/18 And Augmentin q8 hours through  4/20/204. She states she completed therapy and secretions are clear. She denies any fever . She states she is back to her baseline .  We discussed her biopsy results.  I explained that the biopsy results were positive for non-small cell lung cancer, consistent with adenocarcinoma.   Patient was discussed in thoracic conference a week ago, and to be thorough recommendation was made that she is referred to [facility] to be evaluated for surgery in addition to radiation oncology.  Both referrals have been placed.  PFTs look very borderline to me however she will get formal evaluation by surgery.  She does understand that she will need to quit smoking completely to be considered for surgery.  Additionally have placed a referral for cardiology as patient had atrial fibrillation in the office on July 16, 2022 as well as noted on EMS  EKG on July 16, 2022.  Patient had converted into sinus tach by the time she was transported to the hospital by EMS.  Left ventricular function per echo has worsened since prior study.  There was notation of global hypokinesis of the left ventricle.  Referral was not placed upon patient being discharged from inpatient admission therefore follow-up will be done as an outpatient.   Patient is still smoking about 3 cigarettes daily. She knows she needs to quit.  I have asked her to call 1 [phone] quit NOW for free nicotine patches, mints.  She understands to be considered for surgery she will need to have quit smoking totally.   Test Results: 07/16/2022 CTA Chest Developing parenchymal opacity along the right lower lobe with some debris along associated right lower lobe bronchi. Acute infiltrate is possible Underlying emphysematous lung changes seen. Stable spiculated left upper lobe nodule Few enlarged right hilar lymph nodes. Not well seen on the prior but the previous examination was without  contrast. This also can be assessed on short follow-up  07/09/2022 Cytology  A. LUNG, LUL, FINE NEEDLE ASPIRATION:  - Non-small cell carcinoma, consistent with adenocarcinoma  - See comment   B. LUNG, LUL, BRUSHING:  - Atypical cells present   FINAL MICROSCOPIC DIAGNOSIS:  C. LYMPH NODE, 11L, FINE NEEDLE ASPIRATION:  - No malignant cells identified    SPECIMEN  ADEQUACY:  C. Satisfactory for Evaluation   June 26, 2022 CT super D chest without contrast 11 mm spiculated nodule posterior left upper lobe with tethering to the major fissure, stable and shown to be hypermetabolic on previous PET imaging. 2. Stable chronic atelectasis or scarring at the right base. 3.  Aortic Atherosclerosis (ICD10-I70.0).  PET scan July 15, 2022 IMPRESSION: 1. Spiculated hypermetabolic 11 mm left upper lobe pulmonary nodule is compatible with primary bronchogenic neoplasm. 2. No hypermetabolic thoracic adenopathy. 3. No evidence of hypermetabolic metastatic disease. 4.  Aortic Atherosclerosis (ICD10-I70.0). Images personally reviewed by me    Latest Ref Rng & Units 07/17/2022    4:55 AM 07/16/2022   12:40 PM 07/09/2022    8:48 AM  CBC  WBC 4.0 - 10.5 K/uL 13.8  21.3  13.6   Hemoglobin 12.0 - 15.0 g/dL 16.1  09.6  04.5   Hematocrit 36.0 - 46.0 % 36.7  37.7  41.9   Platelets 150 - 400 K/uL 155  271  244        Latest Ref Rng & Units 07/17/2022    4:55 AM 07/16/2022   12:40 PM 07/09/2022    8:48 AM  BMP  Glucose 70 - 99 mg/dL 409  811  914   BUN 8 - 23 mg/dL 21  37  23   Creatinine 0.44 - 1.00 mg/dL 7.82  9.56  2.13   Sodium 135 - 145 mmol/L 135  135  134   Potassium 3.5 - 5.1 mmol/L 4.4  2.7  3.1   Chloride 98 - 111 mmol/L 104  96  99   CO2 22 - 32 mmol/L 21  26  25    Calcium 8.9 - 10.3 mg/dL 8.2  8.8  8.8     BNP    Component Value Date/Time   BNP 45.0 02/17/2018 0946    ProBNP No results found for: "PROBNP"  PFT    Component Value Date/Time   FEV1PRE 0.95 06/03/2022 1120   FEV1POST 0.82 06/03/2022 1120   FVCPRE 1.37 02/16/2019 1523   FVCPOST 1.64 06/03/2022 1120   TLC 8.36 02/16/2019 1523   DLCOUNC 10.31 06/03/2022 1120   PREFEV1FVCRT 54 06/03/2022 1120   PSTFEV1FVCRT 50 06/03/2022 1120    ECHOCARDIOGRAM COMPLETE  Result Date: 07/17/2022    ECHOCARDIOGRAM REPORT   Patient Name:   Holly May Date of Exam: 07/17/2022 Medical Rec #:   [id number]     Height:       67.0 in Accession #:    [id number]    Weight:       139.0 lb Date of Birth:  1950/09/19    BSA:          1.733 m Patient Age:    71 years      BP:           158/106 mmHg Patient Gender: F             HR:           78 bpm. Exam Location:  Inpatient Procedure: 2D Echo, Color Doppler and Cardiac Doppler Indications:  Abnormal ECG  History:        Patient has no prior history of Echocardiogram examinations.                 COPD, Signs/Symptoms:Syncope; Risk Factors:Diabetes,                 Hypertension and Current Smoker. Mediastinal mass.  Sonographer:    Milbert Coulter Referring Phys: [id number] GRACE LAU  Sonographer Comments: No parasternal window. Image acquisition challenging due to COPD. IMPRESSIONS  1. Left ventricular ejection fraction, by estimation, is 50 to 55%. The left ventricle has low normal function. The left ventricle demonstrates global hypokinesis. Left ventricular diastolic parameters were normal.  2. Right ventricular systolic function is normal. The right ventricular size is normal. There is normal pulmonary artery systolic pressure.  3. Left atrial size was mildly dilated.  4. The mitral valve is normal in structure. No evidence of mitral valve regurgitation.  5. The aortic valve is grossly normal. Aortic valve regurgitation is not visualized. Aortic valve sclerosis is present, with no evidence of aortic valve stenosis.  6. The inferior vena cava is normal in size with <50% respiratory variability, suggesting right atrial pressure of 8 mmHg. Comparison(s): A prior study was performed on 04/02/2013. Prior images unable to be directly viewed, comparison made by report only. The left ventricular function is worsened. Incessant premature atrial contractions occurred during the study. There were no parasternal views. FINDINGS  Left Ventricle: Left ventricular ejection fraction, by estimation, is 50 to 55%. The left ventricle has low normal function. The left ventricle  demonstrates global hypokinesis. The left ventricular internal cavity size was normal in size. There is no left ventricular hypertrophy. Left ventricular diastolic parameters were normal. Indeterminate filling pressures. Right Ventricle: The right ventricular size is normal. Right vetricular wall thickness was not well visualized. Right ventricular systolic function is normal. There is normal pulmonary artery systolic pressure. The tricuspid regurgitant velocity is 2.05 m/s, and with an assumed right atrial pressure of 8 mmHg, the estimated right ventricular systolic pressure is 24.8 mmHg. Left Atrium: Left atrial size was mildly dilated. Right Atrium: Right atrial size was normal in size. Pericardium: There is no evidence of pericardial effusion. Mitral Valve: The mitral valve is normal in structure. No evidence of mitral valve regurgitation. Tricuspid Valve: The tricuspid valve is grossly normal. Tricuspid valve regurgitation is trivial. Aortic Valve: The aortic valve is grossly normal. Aortic valve regurgitation is not visualized. Aortic valve sclerosis is present, with no evidence of aortic valve stenosis. Pulmonic Valve: The pulmonic valve was not well visualized. Aorta: The aortic root was not well visualized. Venous: The inferior vena cava is normal in size with less than 50% respiratory variability, suggesting right atrial pressure of 8 mmHg. IAS/Shunts: No atrial level shunt detected by color flow Doppler.   LV Volumes (MOD) LV vol d, MOD A2C: 39.4 ml Diastology LV vol d, MOD A4C: 50.4 ml LV e' medial:    6.64 cm/s LV vol s, MOD A2C: 18.3 ml LV E/e' medial:  15.2 LV vol s, MOD A4C: 30.4 ml LV e' lateral:   9.36 cm/s LV SV MOD A2C:     21.1 ml LV E/e' lateral: 10.8 LV SV MOD A4C:     50.4 ml LV SV MOD BP:      22.0 ml RIGHT VENTRICLE RV Basal diam:  2.80 cm RV Mid diam:    2.40 cm RV S prime:     11.00 cm/s TAPSE (M-mode): 1.8  cm LEFT ATRIUM             Index        RIGHT ATRIUM           Index LA Vol (A2C):    39.6 ml 22.86 ml/m  RA Area:     11.20 cm LA Vol (A4C):   51.2 ml 29.55 ml/m  RA Volume:   21.80 ml  12.58 ml/m LA Biplane Vol: 46.0 ml 26.55 ml/m  AORTIC VALVE LVOT Vmax:   88.85 cm/s LVOT Vmean:  56.850 cm/s LVOT VTI:    0.177 m MITRAL VALVE                TRICUSPID VALVE MV Area (PHT): 3.42 cm     TR Peak grad:   16.8 mmHg MV Decel Time: 222 msec     TR Vmax:        205.00 cm/s MV E velocity: 101.14 cm/s MV A velocity: 84.90 cm/s   SHUNTS MV E/A ratio:  1.19         Systemic VTI: 0.18 m Rachelle Hora Croitoru MD Electronically signed by Thurmon Fair MD Signature Date/Time: 07/17/2022/10:15:28 AM    Final    CT Angio Chest Pulmonary Embolism (PE) W or WO Contrast  Result Date: 07/16/2022 CLINICAL DATA:  Shortness of breath. EXAM: CT ANGIOGRAPHY CHEST WITH CONTRAST TECHNIQUE: Multidetector CT imaging of the chest was performed using the standard protocol during bolus administration of intravenous contrast. Multiplanar CT image reconstructions and MIPs were obtained to evaluate the vascular anatomy. RADIATION DOSE REDUCTION: This exam was performed according to the departmental dose-optimization program which includes automated exposure control, adjustment of the mA and/or kV according to patient size and/or use of iterative reconstruction technique. CONTRAST:  75mL OMNIPAQUE IOHEXOL 350 MG/ML SOLN COMPARISON:  X-ray earlier 07/16/2022. Prior CT scan 06/26/2022. PET-CT 05/16/2022 FINDINGS: Cardiovascular: No pulmonary embolism identified. Patient is status post median sternotomy. The heart is nonenlarged. No pericardial effusion. Coronary artery calcifications are noted. The thoracic aorta has scattered partially calcified atherosclerotic plaque. Slightly tortuous descending thoracic aorta. Mediastinum/Nodes: Small thyroid gland. Slightly patulous thoracic esophagus. Small hiatal hernia. No specific abnormal lymph node enlargement identified in the axillary regions, left hilum. Prominent right hilar nodes  identified. Example series 6, image 81 measures 19 x 15 mm. This better seen on today's study with contrast. Small mediastinal nodes are seen, nonpathologic by size criteria. Lungs/Pleura: There is a opacity seen along the right lower lobe bronchus with narrowing and debris. There is some consolidative opacity in the right lower lobe. Infection is one possibility. This was not seen on the prior exam. No other areas of consolidation. No pneumothorax or effusion. Spiculated left upper lobe nodule again identified with an adjacent presumed fiducial marker. Please correlate with recent bronchoscopy. Underlying emphysematous lung changes, centrilobular. Upper Abdomen: Adrenal glands are preserved in the upper abdomen. Previous cholecystectomy. Musculoskeletal: Scattered degenerative changes of the spine. There is compression deformities identified at multiple levels, greatest at T11 but also T9, T8, T5. Multifocal Schmorl's node changes. These are unchanged from the prior CT scan. Associated sclerosis seen with a compression of T11. Review of the MIP images confirms the above findings. IMPRESSION: No pulmonary embolism identified. Developing parenchymal opacity along the right lower lobe with some debris along associated right lower lobe bronchi. Acute infiltrate is possible. Recommend follow-up to confirm clearance. This was not seen previously. Underlying emphysematous lung changes seen. Stable spiculated left upper lobe nodule. Please correlate with recent workup. Few  enlarged right hilar lymph nodes. Not well seen on the prior but the previous examination was without contrast. This also can be assessed on short follow-up. Stable multifocal compression deformities of the spine Aortic Atherosclerosis (ICD10-I70.0) and Emphysema (ICD10-J43.9). Electronically Signed   By: Karen Kays M.D.   On: 07/16/2022 17:45   DG Chest 2 View  Result Date: 07/16/2022 CLINICAL DATA:  Atrial fibrillation.  Congestion. EXAM: CHEST -  2 VIEW COMPARISON:  Radiographs 07/09/2022 and 07/28/2021.  CT 06/26/2022. FINDINGS: The heart size and mediastinal contours are stable status post median sternotomy. There is diffuse aortic and coronary artery atherosclerosis. A previously demonstrated spiculated nodule posteriorly in the left upper lobe is faintly visible on the lateral view with an apparent new adjacent biopsy clip. No airspace disease, edema, pleural effusion or pneumothorax identified. Thoracolumbar scoliosis and multiple thoracic compression deformities are grossly stable from previous CT. No acute osseous findings are seen. IMPRESSION: 1. No evidence of acute cardiopulmonary process. 2. Previously demonstrated spiculated nodule in the left upper lobe is faintly visible on the lateral view with apparent new adjacent biopsy clip. Correlate with recent biopsy results. Electronically Signed   By: Carey Bullocks M.D.   On: 07/16/2022 11:31   DG Thoracic Spine 2 View  Result Date: 07/10/2022 Clinical: compression fracture T11 X-rays were done of the thoracolumbar spine, two views. There is a healing fracture of the T11 vertebra with about 40 % compression.  Bone quality is good. Impression: healing T11 vertebra fracture. Electronically Signed Darreld Mclean, MD 4/10/20249:39 AM  DG CHEST PORT 1 VIEW  Result Date: 07/09/2022 CLINICAL DATA:  Status post bronchoscopy. EXAM: PORTABLE CHEST 1 VIEW COMPARISON:  Radiograph of July 28, 2021. CT scan of June 26, 2022. PET scan of May 16, 2022. FINDINGS: Stable cardiomediastinal silhouette. Sternotomy wires are noted. Left upper lobe nodular density noted on prior CT scan is noted. No consolidative process is noted. Stable chronic postoperative or posttraumatic changes are seen involving distal left clavicle. IMPRESSION: Left upper lobe nodular density is again noted as described on prior CT scan and PET scans concerning for possible primary bronchogenic neoplasm. Electronically Signed   By:  Lupita Raider M.D.   On: 07/09/2022 13:01   DG C-ARM BRONCHOSCOPY  Result Date: 07/09/2022 C-ARM BRONCHOSCOPY: Fluoroscopy was utilized by the requesting physician.  No radiographic interpretation.     Past medical hx Past Medical History:  Diagnosis Date   Arthritis    Cancer (HCC)    lung   COPD (chronic obstructive pulmonary disease) (HCC)    Depression    Dyspnea    Essential hypertension, benign    Type 2 diabetes mellitus (HCC)    Urinary incontinence      Social History   Tobacco Use   Smoking status: Every Day    Packs/day: 1.50    Years: 50.00    Additional pack years: 0.00    Total pack years: 75.00    Types: Cigarettes    Start date: 01/09/1971   Smokeless tobacco: Never   Tobacco comments:    3 cigarettes smoked daily ARJ, 07/26/22  Vaping Use   Vaping Use: Never used  Substance Use Topics   Alcohol use: Yes    Comment: occ   Drug use: No    Ms.Badley reports that she has been smoking cigarettes. She started smoking about 51 years ago. She has a 75.00 pack-year smoking history. She has never used smokeless tobacco. She reports current alcohol use. She reports that  she does not use drugs.  Tobacco Cessation: Ready to quit: Not Answered Counseling given: Not Answered Tobacco comments: 3 cigarettes smoked daily ARJ, 07/26/22   Past surgical hx, Family hx, Social hx all reviewed.  Current Outpatient Medications on File Prior to Visit  Medication Sig   amLODipine (NORVASC) 5 MG tablet Take 5 mg by mouth daily.   citalopram (CELEXA) 20 MG tablet Take 1 tablet (20 mg total) by mouth daily.   gabapentin (NEURONTIN) 300 MG capsule Take 300 mg by mouth daily.   ipratropium-albuterol (DUONEB) 0.5-2.5 (3) MG/3ML SOLN Take 3 mLs by nebulization every 6 (six) hours as needed (wheezing, shortness of breath).   lisinopril (ZESTRIL) 40 MG tablet Take 40 mg by mouth daily.   lovastatin (MEVACOR) 40 MG tablet Take 40 mg by mouth daily.   oxybutynin (DITROPAN) 5 MG  tablet Take 5 mg by mouth 3 (three) times daily.    TRELEGY ELLIPTA 100-62.5-25 MCG/ACT AEPB Inhale 1 puff into the lungs daily.   albuterol (PROVENTIL HFA;VENTOLIN HFA) 108 (90 Base) MCG/ACT inhaler Inhale 2 puffs into the lungs every 4 (four) hours as needed for wheezing or shortness of breath (cough, shortness of breath or wheezing.). (Patient taking differently: Inhale 2 puffs into the lungs every 4 (four) hours as needed for wheezing or shortness of breath (cough).)   alendronate (FOSAMAX) 70 MG tablet Take 70 mg by mouth once a week. Wednesday   omeprazole (PRILOSEC) 20 MG capsule Take 20 mg by mouth daily.   No current facility-administered medications on file prior to visit.     No Known Allergies  Review Of Systems:  Constitutional:   No  weight loss, night sweats,  Fevers, chills, fatigue, or  lassitude.  HEENT:   No headaches,  Difficulty swallowing,  Tooth/dental problems, or  Sore throat,                No sneezing, itching, ear ache, nasal congestion, post nasal drip,   CV:  No chest pain,  Orthopnea, PND, swelling in lower extremities, anasarca, dizziness, palpitations, syncope.   GI  No heartburn, indigestion, abdominal pain, nausea, vomiting, diarrhea, change in bowel habits, loss of appetite, bloody stools.   Resp: No shortness of breath with exertion or at rest.  No excess mucus, no productive cough,  No non-productive cough,  No coughing up of blood.  No change in color of mucus.  No wheezing.  No chest wall deformity  Skin: no rash or lesions.  GU: no dysuria, change in color of urine, no urgency or frequency.  No flank pain, no hematuria   MS:  No joint pain or swelling.  No decreased range of motion.  No back pain.  Psych:  No change in mood or affect. No depression or anxiety.  No memory loss.   Vital Signs BP 124/76 (BP Location: Right Arm, Patient Position: Sitting, Cuff Size: Normal)   Pulse (!) 58   Temp 97.9 F (36.6 C) (Oral)   Ht 5\' 7"  (1.702 m)    Wt 145 lb 6.4 oz (66 kg)   SpO2 99%   BMI 22.77 kg/m    Physical Exam:  General- No distress,  A&Ox3, pleasant and appropriate ENT: No sinus tenderness, TM clear, pale nasal mucosa, no oral exudate,no post nasal drip, no LAN Cardiac: S1, S2, regular rate and rhythm, no murmur Chest: No wheeze/ rales/ dullness; no accessory muscle use, no nasal flaring, no sternal retractions, diminished per bases Abd.: Soft Non-tender, nondistended, bowel sounds  positive,Body mass index is 22.77 kg/m.  Ext: No clubbing cyanosis, edema Neuro:  normal strength, moving all extremities x 4, alert and oriented x 3 Skin: No rashes, warm and dry, no lesions Psych: normal mood and behavior, anxious as she realizes the significance of the finding of lung cancer   Assessment/Plan Newly diagnosed adenocarcinoma of the lung in a current every day smoker COPD History of atrial fibs Recent diagnosis of pneumonia/sepsis requiring hospitalization on 07/16/2022 Plan Your biopsy was positive for cancer. I have referred you to be evaluated for surgery. You will get a call to schedule. I have referred you to radiation oncology. You will get a call to schedule.  I have referred you to cardiology to have your atrial fibrillation followed. This will be in [place]. You will get a call to get this scheduled.  I have also scheduled an MRI brain. You will get a call to get this scheduled.  Please work on quitting smoking. Please call 1-800-QUIT NOW for free nicotine patches gum or mints. Follow up with Dr. Sherene Sires 5/22 at 10 am as follow up to Pneumonia and COPD flare Continue Trelegy 1 puff once daily Rinse mouth after use. Use albuterol 2 puffs as needed for breakthrough shortness of breath or wheezing. Note your daily symptoms > remember "red flags" for COPD:  Increase in cough, increase in sputum production, increase in shortness of breath or activity intolerance. If you notice these symptoms, please call to be  seen.     I spent 40 minutes dedicated to the care of this patient on the date of this encounter to include pre-visit review of records, face-to-face time with the patient discussing conditions above, post visit ordering of testing, clinical documentation with the electronic health record, making appropriate referrals as documented, and communicating necessary information to the patient's healthcare team.   Bevelyn Ngo, NP 07/26/2022  5:21 PM

## 2022-07-29 NOTE — Progress Notes (Incomplete)
Thoracic Location of Tumor / Histology: Left Upper Lobe  07/16/2022 Dr. Dickie La CT Angio Chest Pulmonary Embolism with/without Contrast CLINICAL DATA:  Shortness of breath.   IMPRESSION: No pulmonary embolism identified. Developing parenchymal opacity along the right lower lobe with some debris along associated right lower lobe bronchi. Acute infiltrate is possible. Recommend follow-up to confirm clearance. This was not seen previously.   Underlying emphysematous lung changes seen. Stable spiculated left upper lobe nodule. Please correlate with recent workup.  Few enlarged right hilar lymph nodes. Not well seen on the prior but the previous examination was without contrast. This also can be assessed on short follow-up.  Stable multifocal compression deformities of the spine.  Aortic Atherosclerosis (ICD10-I70.0) and Emphysema (ICD10-J43.9).  07/15/2022 Kandice Robinsons, NP PET Scan IMPRESSION: 1. Spiculated hypermetabolic 11 mm left upper lobe pulmonary nodule is compatible with primary bronchogenic neoplasm. 2. No hypermetabolic thoracic adenopathy. 3. No evidence of hypermetabolic metastatic disease. 4.  Aortic Atherosclerosis (ICD10-I70.0).  07/09/2022 Dr. Elige Radon Icard DG Chest [facility] [place] 1 View CLINICAL DATA: Status post bronchoscopy.   IMPRESSION: Left upper lobe nodular density is again noted as described on prior CT scan and PET scans concerning for possible primary bronchogenic neoplasm.  06/26/2022 Dr. Elige Radon Icard CT Super D Chest without Contrast CLINICAL DATA: Pulmonary nodule. Pre-procedure planning.   IMPRESSION: 1. 11 mm spiculated nodule posterior left upper lobe with tethering to the major fissure, stable and shown to be hypermetabolic on previous PET imaging. 2. Stable chronic atelectasis or scarring at the right base. 3.  Aortic Atherosclerosis (ICD10-I70.0).   Tobacco/Marijuana/Snuff/ETOH use: Smoke cigarettes everyday, no drug or snuff use, occasional  drinker.  Past/Anticipated interventions by cardiothoracic surgery, if any:   07/09/2022 Dr. Elige Radon Icard Robotic Assisted Navigational Bronchoscopy (Left Upper Lobe) Fiducial marker placed.  Past/Anticipated interventions by medical oncology, if any: NA  Signs/Symptoms Weight changes, if any: Yes, weight loss 20 lbs no appetite per patient. Respiratory complaints, if any: Yes, SOB has COPD, recently hospitalization (07/16/2022) Dx of Pneumonia/sepsis and current smoker. Hemoptysis, if any: No Pain issues, if any:  0/10  SAFETY ISSUES: Prior radiation? No Pacemaker/ICD?  No Possible current pregnancy? Postmenopausal Is the patient on methotrexate?  No  Current Complaints / other details: No

## 2022-07-29 NOTE — Progress Notes (Signed)
Reviewed at [facility] office visit   Thanks,  BLI  Josephine Igo, DO [facility] 07/29/2022 8:16 PM

## 2022-07-30 ENCOUNTER — Encounter: Payer: Self-pay | Admitting: Radiation Oncology

## 2022-07-30 ENCOUNTER — Ambulatory Visit
Admission: RE | Admit: 2022-07-30 | Discharge: 2022-07-30 | Disposition: A | Discharge status: Home / Self Care | Payer: Medicare HMO | Source: Ambulatory Visit | Attending: Radiation Oncology | Admitting: Radiation Oncology

## 2022-07-30 VITALS — BP 138/84 | HR 105 | Temp 97.8°F | Resp 24 | Ht 67.0 in | Wt 141.6 lb

## 2022-07-30 DIAGNOSIS — C3412 Malignant neoplasm of upper lobe, left bronchus or lung: Secondary | ICD-10-CM | POA: Diagnosis present

## 2022-07-30 NOTE — Progress Notes (Signed)
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    ICD-10-CM   1. Malignant neoplasm of upper lobe bronchus, left (HCC)  C34.12       HISTORY OF PRESENT ILLNESS: Holly May is a 72 y.o. female seen at the request of Dr. Tonia Brooms. She has a history of thymoma, s/p sternotomy and mediastinal mass resection on 02/18/19 under the care of Dr. 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  A chest CT performed on 02/07/22 showed an increased size and new solid component of and 11 mm spiculated left upper lobe nodule with associated peripheral lucency/cavitation and tethering of the adjacent fissure.additionally, there was a new indeterminate 10 mm RLL nodule and a 2 mm lingula nodule as well as an indeterminate 2.1 cm exophytic right upper pole renal lesion,  new compression deformities of T9 and T11, unchanged T8 and L1 compression deformities but no residual or recurrent soft tissue mass in the anterior mediastinum.  She was referred back to pulmonology and saw Dr. Sherene Sires in the [place] office.  A repeat chest CT on 05/07/22 showed an enlarging spiculated nodule in the LUL and improvement in peribronchial thickening and bronchiectasis in the RLL with no mediastinal lymphadenopathy. The LUL nodule was further evaluated with a PET scan performed on 05/16/22 and showed hypermetabolic activity in the 11 mm LUL pulmonary nodule but no thoracic adenopathy or evidence of  hypermetabolic metastatic disease.  She was referred to Dr. Tonia Brooms and proceeded to robotic assisted bronchoscopy on 07/09/22 with sampling of the LUL nodule and 11L node. Cytology from the LUL confirmed non-small cell carcinoma, consistent with adenocarcinoma but cytology from the 11L node was negative.  Of note, she presented to the pulmonary office on 07/16/22 for her routine scheduled follow-up visit to review pathology but was sent to the ED for evaluation of Afib with increased SOB and diaphoresis. An angio chest CT showed developing parenchymal opacity along the RLL with associated debris within the RLL bronchi, consistent with a new acute infiltrate. She was treated for pneumonia/sepsis and discharged home on 07/17/2022 with amoxicillin, azithromycin, and prednisone.  She is feeling much better and is working hard to quit smoking.  PREVIOUS RADIATION THERAPY: No  PAST MEDICAL HISTORY:  Past Medical History:  Diagnosis Date   Arthritis    Cancer (HCC)    lung   COPD (chronic obstructive pulmonary disease) (HCC)    Depression    Dyspnea    Essential hypertension, benign    Type 2 diabetes mellitus (HCC)    Urinary incontinence       PAST SURGICAL HISTORY: Past Surgical History:  Procedure Laterality Date   BRONCHIAL BIOPSY  07/09/2022   Procedure: BRONCHIAL BIOPSIES;  Surgeon: Josephine Igo, DO;  Location: [facility];  Service: Pulmonary;;   BRONCHIAL BRUSHINGS  07/09/2022   Procedure: BRONCHIAL BRUSHINGS;  Surgeon: Josephine Igo, DO;  Location: [facility];  Service: Pulmonary;;   BRONCHIAL NEEDLE  ASPIRATION BIOPSY  07/09/2022   Procedure: BRONCHIAL NEEDLE ASPIRATION BIOPSIES;  Surgeon: Josephine Igo, DO;  Location: [facility];  Service: Pulmonary;;   CHOLECYSTECTOMY     FIDUCIAL MARKER PLACEMENT  07/09/2022   Procedure: FIDUCIAL MARKER PLACEMENT;  Surgeon: Josephine Igo, DO;  Location: [facility];  Service: Pulmonary;;   FRACTURE SURGERY Left    arm   OPEN REDUCTION  INTERNAL FIXATION (ORIF) PROXIMAL PHALANX Left 04/11/2017   Procedure: OPEN REDUCTION INTERNAL FIXATION (ORIF) PROXIMAL PHALANX;  Surgeon: Dairl Ponder, MD;  Location: [facility];  Service: Orthopedics;  Laterality: Left;   RESECTION OF MEDIASTINAL MASS N/A 02/18/2019   Procedure: RESECTION OF MEDIASTINAL MASS;  Surgeon: Kerin Perna, MD;  Location: [facility];  Service: Thoracic;  Laterality: N/A;   STERNOTOMY N/A 02/18/2019   Procedure: STERNOTOMY;  Surgeon: Kerin Perna, MD;  Location: [facility] OR;  Service: Thoracic;  Laterality: N/A;   VIDEO BRONCHOSCOPY WITH ENDOBRONCHIAL ULTRASOUND Bilateral 07/09/2022   Procedure: VIDEO BRONCHOSCOPY WITH ENDOBRONCHIAL ULTRASOUND;  Surgeon: Josephine Igo, DO;  Location: [facility];  Service: Pulmonary;  Laterality: Bilateral;    FAMILY HISTORY:  Family History  Problem Relation Age of Onset   Diabetes Mellitus II Sister    Diabetes Mellitus II Mother    Hypertension Brother    Hypertension Sister     SOCIAL HISTORY:  Social History   Socioeconomic History   Marital status: Widowed    Spouse name: Not on file   Number of children: Not on file   Years of education: Not on file   Highest education level: Not on file  Occupational History   Occupation: retired  Tobacco Use   Smoking status: Every Day    Packs/day: 1.50    Years: 50.00    Additional pack years: 0.00    Total pack years: 75.00    Types: Cigarettes    Start date: 01/09/1971   Smokeless tobacco: Never   Tobacco comments:    3 cigarettes smoked daily ARJ, 07/26/22  Vaping Use   Vaping Use: Never used  Substance and Sexual Activity   Alcohol use: Yes    Comment: occ   Drug use: No   Sexual activity: Not on file  Other Topics Concern   Not on file  Social History Narrative   Not on file   Social Determinants of Health   Financial Resource Strain: Not on file  Food Insecurity: No Food Insecurity (07/16/2022)   Hunger Vital Sign    Worried About  Running Out of Food in the Last Year: Never true    Ran Out of Food in the Last Year: Never true  Transportation Needs: No Transportation Needs (07/16/2022)   PRAPARE - Administrator, Civil Service (Medical): No    Lack of Transportation (Non-Medical): No  Physical Activity: Not on file  Stress: Not on file  Social Connections: Not on file  Intimate Partner Violence: Not At Risk (07/16/2022)   Humiliation, Afraid, Rape, and Kick questionnaire    Fear of Current or Ex-Partner: No    Emotionally Abused: No    Physically Abused: No    Sexually Abused: No    ALLERGIES: Patient has no known allergies.  MEDICATIONS:  Current Outpatient Medications  Medication Sig Dispense Refill   albuterol (PROVENTIL HFA;VENTOLIN HFA) 108 (90 Base) MCG/ACT inhaler Inhale 2 puffs into the lungs every 4 (four) hours as needed for wheezing or shortness of breath (cough, shortness of breath or wheezing.). (  Patient taking differently: Inhale 2 puffs into the lungs every 4 (four) hours as needed for wheezing or shortness of breath (cough).) 1 Inhaler 3   alendronate (FOSAMAX) 70 MG tablet Take 70 mg by mouth once a week. Wednesday     amLODipine (NORVASC) 5 MG tablet Take 5 mg by mouth daily.     citalopram (CELEXA) 20 MG tablet Take 1 tablet (20 mg total) by mouth daily. 30 tablet 0   gabapentin (NEURONTIN) 300 MG capsule Take 300 mg by mouth daily.     ipratropium-albuterol (DUONEB) 0.5-2.5 (3) MG/3ML SOLN Take 3 mLs by nebulization every 6 (six) hours as needed (wheezing, shortness of breath).     lisinopril (ZESTRIL) 40 MG tablet Take 40 mg by mouth daily.     lovastatin (MEVACOR) 40 MG tablet Take 40 mg by mouth daily.     omeprazole (PRILOSEC) 20 MG capsule Take 20 mg by mouth daily.     oxybutynin (DITROPAN) 5 MG tablet Take 5 mg by mouth 3 (three) times daily.      TRELEGY ELLIPTA 100-62.5-25 MCG/ACT AEPB Inhale 1 puff into the lungs daily.     No current facility-administered medications for  this encounter.    REVIEW OF SYSTEMS:  On review of systems, the patient reports that she is doing well overall. She reports baseline SOB secondary to COPD but denies any chest pain, productive cough, hemoptysis, fevers, chills, or night sweats. She reports an unintended weight loss of 20 lbs due to lack of appetite but denies any bowel or bladder disturbances, and denies abdominal pain, nausea or vomiting. She denies any new musculoskeletal or joint aches or pains. A complete review of systems is obtained and is otherwise negative.    PHYSICAL EXAM:  Wt Readings from Last 3 Encounters:  07/26/22 145 lb 6.4 oz (66 kg)  07/16/22 139 lb (63 kg)  07/16/22 139 lb 3.2 oz (63.1 kg)   Temp Readings from Last 3 Encounters:  07/26/22 97.9 F (36.6 C) (Oral)  07/17/22 98.7 F (37.1 C) (Oral)  07/09/22 98.6 F (37 C) (Oral)   BP Readings from Last 3 Encounters:  07/26/22 124/76  07/17/22 (!) 148/104  07/16/22 120/64   Pulse Readings from Last 3 Encounters:  07/26/22 (!) 58  07/17/22 61  07/16/22 (!) 51    /10  In general this is a well appearing African-American woman in no acute distress. She's alert and oriented x4 and appropriate throughout the examination. Cardiopulmonary assessment is negative for acute distress and she exhibits normal effort.     KPS = 90  100 - Normal; no complaints; no evidence of disease. 90   - Able to carry on normal activity; minor signs or symptoms of disease. 80   - Normal activity with effort; some signs or symptoms of disease. 49   - Cares for self; unable to carry on normal activity or to do active work. 60   - Requires occasional assistance, but is able to care for most of his personal needs. 50   - Requires considerable assistance and frequent medical care. 40   - Disabled; requires special care and assistance. 30   - Severely disabled; hospital admission is indicated although death not imminent. 20   - Very sick; hospital admission necessary;  active supportive treatment necessary. 10   - Moribund; fatal processes progressing rapidly. 0     - Dead  Karnofsky DA, Abelmann WH, Craver LS and Burchenal [facility] [phone]) The use of the nitrogen  mustards in the palliative treatment of carcinoma: with particular reference to bronchogenic carcinoma Cancer 1 634-56  LABORATORY DATA:  Lab Results  Component Value Date   WBC 13.8 (H) 07/17/2022   HGB 11.8 (L) 07/17/2022   HCT 36.7 07/17/2022   MCV 101.7 (H) 07/17/2022   PLT 155 07/17/2022   Lab Results  Component Value Date   NA 135 07/17/2022   K 4.4 07/17/2022   CL 104 07/17/2022   CO2 21 (L) 07/17/2022   Lab Results  Component Value Date   ALT 12 07/17/2022   AST 18 07/17/2022   ALKPHOS 52 07/17/2022   BILITOT 0.9 07/17/2022     RADIOGRAPHY: ECHOCARDIOGRAM COMPLETE  Result Date: 07/17/2022    ECHOCARDIOGRAM REPORT   Patient Name:   DAVIS VANNATTER Date of Exam: 07/17/2022 Medical Rec #:  [id number]     Height:       67.0 in Accession #:    [id number]    Weight:       139.0 lb Date of Birth:  03/03/1951    BSA:          1.733 m Patient Age:    48 years      BP:           158/106 mmHg Patient Gender: F             HR:           78 bpm. Exam Location:  Inpatient Procedure: 2D Echo, Color Doppler and Cardiac Doppler Indications:    Abnormal ECG  History:        Patient has no prior history of Echocardiogram examinations.                 COPD, Signs/Symptoms:Syncope; Risk Factors:Diabetes,                 Hypertension and Current Smoker. Mediastinal mass.  Sonographer:    Milbert Coulter Referring Phys: [id number] GRACE LAU  Sonographer Comments: No parasternal window. Image acquisition challenging due to COPD. IMPRESSIONS  1. Left ventricular ejection fraction, by estimation, is 50 to 55%. The left ventricle has low normal function. The left ventricle demonstrates global hypokinesis. Left ventricular diastolic parameters were normal.  2. Right ventricular systolic function is normal. The right  ventricular size is normal. There is normal pulmonary artery systolic pressure.  3. Left atrial size was mildly dilated.  4. The mitral valve is normal in structure. No evidence of mitral valve regurgitation.  5. The aortic valve is grossly normal. Aortic valve regurgitation is not visualized. Aortic valve sclerosis is present, with no evidence of aortic valve stenosis.  6. The inferior vena cava is normal in size with <50% respiratory variability, suggesting right atrial pressure of 8 mmHg. Comparison(s): A prior study was performed on 04/02/2013. Prior images unable to be directly viewed, comparison made by report only. The left ventricular function is worsened. Incessant premature atrial contractions occurred during the study. There were no parasternal views. FINDINGS  Left Ventricle: Left ventricular ejection fraction, by estimation, is 50 to 55%. The left ventricle has low normal function. The left ventricle demonstrates global hypokinesis. The left ventricular internal cavity size was normal in size. There is no left ventricular hypertrophy. Left ventricular diastolic parameters were normal. Indeterminate filling pressures. Right Ventricle: The right ventricular size is normal. Right vetricular wall thickness was not well visualized. Right ventricular systolic function is normal. There is normal pulmonary artery systolic pressure. The tricuspid regurgitant velocity is 2.05  m/s, and with an assumed right atrial pressure of 8 mmHg, the estimated right ventricular systolic pressure is 24.8 mmHg. Left Atrium: Left atrial size was mildly dilated. Right Atrium: Right atrial size was normal in size. Pericardium: There is no evidence of pericardial effusion. Mitral Valve: The mitral valve is normal in structure. No evidence of mitral valve regurgitation. Tricuspid Valve: The tricuspid valve is grossly normal. Tricuspid valve regurgitation is trivial. Aortic Valve: The aortic valve is grossly normal. Aortic valve  regurgitation is not visualized. Aortic valve sclerosis is present, with no evidence of aortic valve stenosis. Pulmonic Valve: The pulmonic valve was not well visualized. Aorta: The aortic root was not well visualized. Venous: The inferior vena cava is normal in size with less than 50% respiratory variability, suggesting right atrial pressure of 8 mmHg. IAS/Shunts: No atrial level shunt detected by color flow Doppler.   LV Volumes (MOD) LV vol d, MOD A2C: 39.4 ml Diastology LV vol d, MOD A4C: 50.4 ml LV e' medial:    6.64 cm/s LV vol s, MOD A2C: 18.3 ml LV E/e' medial:  15.2 LV vol s, MOD A4C: 30.4 ml LV e' lateral:   9.36 cm/s LV SV MOD A2C:     21.1 ml LV E/e' lateral: 10.8 LV SV MOD A4C:     50.4 ml LV SV MOD BP:      22.0 ml RIGHT VENTRICLE RV Basal diam:  2.80 cm RV Mid diam:    2.40 cm RV S prime:     11.00 cm/s TAPSE (M-mode): 1.8 cm LEFT ATRIUM             Index        RIGHT ATRIUM           Index LA Vol (A2C):   39.6 ml 22.86 ml/m  RA Area:     11.20 cm LA Vol (A4C):   51.2 ml 29.55 ml/m  RA Volume:   21.80 ml  12.58 ml/m LA Biplane Vol: 46.0 ml 26.55 ml/m  AORTIC VALVE LVOT Vmax:   88.85 cm/s LVOT Vmean:  56.850 cm/s LVOT VTI:    0.177 m MITRAL VALVE                TRICUSPID VALVE MV Area (PHT): 3.42 cm     TR Peak grad:   16.8 mmHg MV Decel Time: 222 msec     TR Vmax:        205.00 cm/s MV E velocity: 101.14 cm/s MV A velocity: 84.90 cm/s   SHUNTS MV E/A ratio:  1.19         Systemic VTI: 0.18 m Rachelle Hora Croitoru MD Electronically signed by Thurmon Fair MD Signature Date/Time: 07/17/2022/10:15:28 AM    Final    CT Angio Chest Pulmonary Embolism (PE) W or WO Contrast  Result Date: 07/16/2022 CLINICAL DATA:  Shortness of breath. EXAM: CT ANGIOGRAPHY CHEST WITH CONTRAST TECHNIQUE: Multidetector CT imaging of the chest was performed using the standard protocol during bolus administration of intravenous contrast. Multiplanar CT image reconstructions and MIPs were obtained to evaluate the vascular  anatomy. RADIATION DOSE REDUCTION: This exam was performed according to the departmental dose-optimization program which includes automated exposure control, adjustment of the mA and/or kV according to patient size and/or use of iterative reconstruction technique. CONTRAST:  75mL OMNIPAQUE IOHEXOL 350 MG/ML SOLN COMPARISON:  X-ray earlier 07/16/2022. Prior CT scan 06/26/2022. PET-CT 05/16/2022 FINDINGS: Cardiovascular: No pulmonary embolism identified. Patient is status post median sternotomy. The heart is nonenlarged.  No pericardial effusion. Coronary artery calcifications are noted. The thoracic aorta has scattered partially calcified atherosclerotic plaque. Slightly tortuous descending thoracic aorta. Mediastinum/Nodes: Small thyroid gland. Slightly patulous thoracic esophagus. Small hiatal hernia. No specific abnormal lymph node enlargement identified in the axillary regions, left hilum. Prominent right hilar nodes identified. Example series 6, image 81 measures 19 x 15 mm. This better seen on today's study with contrast. Small mediastinal nodes are seen, nonpathologic by size criteria. Lungs/Pleura: There is a opacity seen along the right lower lobe bronchus with narrowing and debris. There is some consolidative opacity in the right lower lobe. Infection is one possibility. This was not seen on the prior exam. No other areas of consolidation. No pneumothorax or effusion. Spiculated left upper lobe nodule again identified with an adjacent presumed fiducial marker. Please correlate with recent bronchoscopy. Underlying emphysematous lung changes, centrilobular. Upper Abdomen: Adrenal glands are preserved in the upper abdomen. Previous cholecystectomy. Musculoskeletal: Scattered degenerative changes of the spine. There is compression deformities identified at multiple levels, greatest at T11 but also T9, T8, T5. Multifocal Schmorl's node changes. These are unchanged from the prior CT scan. Associated sclerosis seen  with a compression of T11. Review of the MIP images confirms the above findings. IMPRESSION: No pulmonary embolism identified. Developing parenchymal opacity along the right lower lobe with some debris along associated right lower lobe bronchi. Acute infiltrate is possible. Recommend follow-up to confirm clearance. This was not seen previously. Underlying emphysematous lung changes seen. Stable spiculated left upper lobe nodule. Please correlate with recent workup. Few enlarged right hilar lymph nodes. Not well seen on the prior but the previous examination was without contrast. This also can be assessed on short follow-up. Stable multifocal compression deformities of the spine Aortic Atherosclerosis (ICD10-I70.0) and Emphysema (ICD10-J43.9). Electronically Signed   By: Karen Kays M.D.   On: 07/16/2022 17:45   DG Chest 2 View  Result Date: 07/16/2022 CLINICAL DATA:  Atrial fibrillation.  Congestion. EXAM: CHEST - 2 VIEW COMPARISON:  Radiographs 07/09/2022 and 07/28/2021.  CT 06/26/2022. FINDINGS: The heart size and mediastinal contours are stable status post median sternotomy. There is diffuse aortic and coronary artery atherosclerosis. A previously demonstrated spiculated nodule posteriorly in the left upper lobe is faintly visible on the lateral view with an apparent new adjacent biopsy clip. No airspace disease, edema, pleural effusion or pneumothorax identified. Thoracolumbar scoliosis and multiple thoracic compression deformities are grossly stable from previous CT. No acute osseous findings are seen. IMPRESSION: 1. No evidence of acute cardiopulmonary process. 2. Previously demonstrated spiculated nodule in the left upper lobe is faintly visible on the lateral view with apparent new adjacent biopsy clip. Correlate with recent biopsy results. Electronically Signed   By: Carey Bullocks M.D.   On: 07/16/2022 11:31   DG Thoracic Spine 2 View  Result Date: 07/10/2022 Clinical: compression fracture T11  X-rays were done of the thoracolumbar spine, two views. There is a healing fracture of the T11 vertebra with about 40 % compression.  Bone quality is good. Impression: healing T11 vertebra fracture. Electronically Signed Darreld Mclean, MD 4/10/20249:39 AM  DG CHEST PORT 1 VIEW  Result Date: 07/09/2022 CLINICAL DATA:  Status post bronchoscopy. EXAM: PORTABLE CHEST 1 VIEW COMPARISON:  Radiograph of July 28, 2021. CT scan of June 26, 2022. PET scan of May 16, 2022. FINDINGS: Stable cardiomediastinal silhouette. Sternotomy wires are noted. Left upper lobe nodular density noted on prior CT scan is noted. No consolidative process is noted. Stable chronic postoperative or posttraumatic  changes are seen involving distal left clavicle. IMPRESSION: Left upper lobe nodular density is again noted as described on prior CT scan and PET scans concerning for possible primary bronchogenic neoplasm. Electronically Signed   By: Lupita Raider M.D.   On: 07/09/2022 13:01   DG C-ARM BRONCHOSCOPY  Result Date: 07/09/2022 C-ARM BRONCHOSCOPY: Fluoroscopy was utilized by the requesting physician.  No radiographic interpretation.      IMPRESSION/PLAN: 1. 72 y.o. 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  Today, we talked to the patient about the findings and workup thus far. We discussed the natural history of non-small cell lung cancer, adenocarcinoma and general treatment, highlighting the role of radiotherapy in the management. We discussed the available radiation techniques, and focused on the details and logistics of delivery.  If she is not deemed to be a surgical candidate, the recommendation will be for a 3 fraction course of stereotactic body radiotherapy (SBRT) to the LUL nodule.  We reviewed the anticipated acute and late sequelae associated with radiation in this setting. The patient was encouraged to ask questions that were answered to her stated  satisfaction.  At the end of our conversation, the patient remains most interested in surgical management and is scheduled for a consult visit with Dr. Dorris Fetch on 08/08/2022.  She is also scheduled for a consult visit with Dr. Arbutus Ped on 08/01/2022 which she plans to keep.  We enjoyed meeting her today and look forward to continuing to follow along in her care.  She knows that we are more than happy to participate in her treatment if she is not deemed to be a good surgical candidate or she ultimately elects to proceed with radiotherapy as opposed to surgery.  She has our contact information and knows that she is welcome to call at anytime with any further questions or concerns related to the radiation treatment options discussed today.  We personally spent 60 minutes in this encounter including chart review, reviewing radiological studies, meeting face-to-face with the patient, entering orders and completing documentation.    Marguarite Arbour, PA-C    Margaretmary Dys, MD  [facility]  Radiation Oncology Direct Dial: [phone]  Fax: [phone] [email or web address]  Skype  [email or web address]   This document serves as a record of services personally performed by Margaretmary Dys, MD and Marcello Fennel, PA-C. It was created on their behalf by Mickie Bail, a trained medical scribe. The creation of this record is based on the scribe's personal observations and the provider's statements to them. This document has been checked and approved by the attending provider.

## 2022-07-31 ENCOUNTER — Other Ambulatory Visit: Payer: Self-pay | Admitting: Physician Assistant

## 2022-07-31 DIAGNOSIS — R911 Solitary pulmonary nodule: Secondary | ICD-10-CM
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
## 2022-08-01 ENCOUNTER — Inpatient Hospital Stay: Payer: Medicare HMO

## 2022-08-01 ENCOUNTER — Other Ambulatory Visit: Payer: Self-pay

## 2022-08-01 ENCOUNTER — Inpatient Hospital Stay: Payer: Medicare HMO | Attending: Physician Assistant | Admitting: Physician Assistant

## 2022-08-01 VITALS — BP 143/82 | HR 82 | Temp 98.3°F | Resp 17 | Wt 145.9 lb

## 2022-08-01 DIAGNOSIS — F32A Depression, unspecified: Secondary | ICD-10-CM | POA: Diagnosis not present

## 2022-08-01 DIAGNOSIS — Z79899 Other long term (current) drug therapy: Secondary | ICD-10-CM | POA: Insufficient documentation

## 2022-08-01 DIAGNOSIS — R197 Diarrhea, unspecified: Secondary | ICD-10-CM | POA: Diagnosis not present

## 2022-08-01 DIAGNOSIS — I4891 Unspecified atrial fibrillation: Secondary | ICD-10-CM | POA: Insufficient documentation

## 2022-08-01 DIAGNOSIS — M8008XA Age-related osteoporosis with current pathological fracture, vertebra(e), initial encounter for fracture: Secondary | ICD-10-CM | POA: Diagnosis not present

## 2022-08-01 DIAGNOSIS — K449 Diaphragmatic hernia without obstruction or gangrene: Secondary | ICD-10-CM | POA: Insufficient documentation

## 2022-08-01 DIAGNOSIS — I2510 Atherosclerotic heart disease of native coronary artery without angina pectoris: Secondary | ICD-10-CM | POA: Diagnosis not present

## 2022-08-01 DIAGNOSIS — Z9049 Acquired absence of other specified parts of digestive tract: Secondary | ICD-10-CM | POA: Insufficient documentation

## 2022-08-01 DIAGNOSIS — F17210 Nicotine dependence, cigarettes, uncomplicated: Secondary | ICD-10-CM | POA: Diagnosis not present

## 2022-08-01 DIAGNOSIS — Z833 Family history of diabetes mellitus: Secondary | ICD-10-CM | POA: Diagnosis not present

## 2022-08-01 DIAGNOSIS — I10 Essential (primary) hypertension: Secondary | ICD-10-CM | POA: Insufficient documentation

## 2022-08-01 DIAGNOSIS — C3412 Malignant neoplasm of upper lobe, left bronchus or lung: Secondary | ICD-10-CM | POA: Diagnosis not present

## 2022-08-01 DIAGNOSIS — I700 Atherosclerosis of aorta: Secondary | ICD-10-CM | POA: Insufficient documentation

## 2022-08-01 DIAGNOSIS — R61 Generalized hyperhidrosis: Secondary | ICD-10-CM | POA: Insufficient documentation

## 2022-08-01 DIAGNOSIS — Z5941 Food insecurity: Secondary | ICD-10-CM | POA: Diagnosis not present

## 2022-08-01 DIAGNOSIS — R911 Solitary pulmonary nodule: Secondary | ICD-10-CM

## 2022-08-01 DIAGNOSIS — J449 Chronic obstructive pulmonary disease, unspecified: Secondary | ICD-10-CM

## 2022-08-01 DIAGNOSIS — M8580 Other specified disorders of bone density and structure, unspecified site: Secondary | ICD-10-CM

## 2022-08-01 DIAGNOSIS — Z8249 Family history of ischemic heart disease and other diseases of the circulatory system: Secondary | ICD-10-CM | POA: Insufficient documentation

## 2022-08-01 DIAGNOSIS — J439 Emphysema, unspecified: Secondary | ICD-10-CM | POA: Insufficient documentation

## 2022-08-01 LAB — CBC WITH DIFFERENTIAL (CANCER CENTER ONLY)
Abs Immature Granulocytes: 0.12 10*3/uL — ABNORMAL HIGH (ref 0.00–0.07)
Basophils Absolute: 0.1 10*3/uL (ref 0.0–0.1)
Basophils Relative: 1 %
Eosinophils Absolute: 0.4 10*3/uL (ref 0.0–0.5)
Eosinophils Relative: 4 %
HCT: 39.4 % (ref 36.0–46.0)
Hemoglobin: 12.9 g/dL (ref 12.0–15.0)
Immature Granulocytes: 1 %
Lymphocytes Relative: 30 %
Lymphs Abs: 3.2 10*3/uL (ref 0.7–4.0)
MCH: 33 pg (ref 26.0–34.0)
MCHC: 32.7 g/dL (ref 30.0–36.0)
MCV: 100.8 fL — ABNORMAL HIGH (ref 80.0–100.0)
Monocytes Absolute: 0.8 10*3/uL (ref 0.1–1.0)
Monocytes Relative: 7 %
Neutro Abs: 6.2 10*3/uL (ref 1.7–7.7)
Neutrophils Relative %: 57 %
Platelet Count: 217 10*3/uL (ref 150–400)
RBC: 3.91 MIL/uL (ref 3.87–5.11)
RDW: 14.6 % (ref 11.5–15.5)
WBC Count: 10.8 10*3/uL — ABNORMAL HIGH (ref 4.0–10.5)
nRBC: 0 % (ref 0.0–0.2)

## 2022-08-01 LAB — CMP (CANCER CENTER ONLY)
ALT: 11 U/L (ref 0–44)
AST: 12 U/L — ABNORMAL LOW (ref 15–41)
Albumin: 3.7 g/dL (ref 3.5–5.0)
Alkaline Phosphatase: 64 U/L (ref 38–126)
Anion gap: 6 (ref 5–15)
BUN: 19 mg/dL (ref 8–23)
CO2: 29 mmol/L (ref 22–32)
Calcium: 9.1 mg/dL (ref 8.9–10.3)
Chloride: 105 mmol/L (ref 98–111)
Creatinine: 0.89 mg/dL (ref 0.44–1.00)
GFR, Estimated: >60 mL/min (ref >60)
Glucose, Bld: 137 mg/dL — ABNORMAL HIGH (ref 70–99)
Potassium: 4.1 mmol/L (ref 3.5–5.1)
Sodium: 140 mmol/L (ref 135–145)
Total Bilirubin: 0.5 mg/dL (ref 0.3–1.2)
Total Protein: 6.6 g/dL (ref 6.5–8.1)

## 2022-08-08 ENCOUNTER — Encounter: Payer: Self-pay | Admitting: Radiation Oncology

## 2022-08-08 ENCOUNTER — Institutional Professional Consult (permissible substitution) (INDEPENDENT_AMBULATORY_CARE_PROVIDER_SITE_OTHER): Payer: Medicare HMO | Admitting: Thoracic Surgery (Cardiothoracic Vascular Surgery)

## 2022-08-08 ENCOUNTER — Other Ambulatory Visit: Payer: Self-pay

## 2022-08-08 ENCOUNTER — Encounter: Payer: Self-pay | Admitting: Thoracic Surgery (Cardiothoracic Vascular Surgery)

## 2022-08-08 VITALS — BP 133/76 | HR 60 | Resp 18 | Ht 67.0 in | Wt 145.0 lb

## 2022-08-08 DIAGNOSIS — C3492 Malignant neoplasm of unspecified part of left bronchus or lung: Secondary | ICD-10-CM | POA: Diagnosis not present

## 2022-08-08 NOTE — Progress Notes (Signed)
PCP is Alvina Filbert, MD Referring Provider is Bevelyn Ngo, NP  Chief Complaint  Patient presents with   Lung Cancer    Surgical consult    HPI: Holly May is sent for consultation for possible surgical resection for a newly diagnosed lung cancer.  Holly May is a 72 year old woman with a history of tobacco abuse, severe COPD, type 2 diabetes, hypertension, hyperlipidemia, coronary calcification, thymoma, subdural hematoma, osteoporosis, and recent pneumonia.  She has about a 75-pack-year history of smoking (1.5 PPD x 50 years).  She has been trying to quit and currently smoking less than half a pack a day.  She was found to have a spiculated left upper lobe lung nodule.  The nodule is hypermetabolic on PET/CT.  There is no evidence of regional or distant metastatic disease.  She had a navigational bronchoscopy by Dr. Tonia Brooms which showed adenocarcinoma.  She was seen in consultation by radiation oncology and felt to be a good candidate for stereotactic radiation.  She now presents for evaluation as a possible surgical candidate.  She gets short of breath with exertion.  She says it is better than it was recently when she had pneumonia.  Denies change in appetite or weight loss.  Past Medical History:  Diagnosis Date   Arthritis    Cancer (HCC)    lung   COPD (chronic obstructive pulmonary disease) (HCC)    Depression    Dyspnea    Essential hypertension, benign    Type 2 diabetes mellitus (HCC)    Urinary incontinence     Past Surgical History:  Procedure Laterality Date   BRONCHIAL BIOPSY  07/09/2022   Procedure: BRONCHIAL BIOPSIES;  Surgeon: Josephine Igo, DO;  Location: [facility];  Service: Pulmonary;;   BRONCHIAL BRUSHINGS  07/09/2022   Procedure: BRONCHIAL BRUSHINGS;  Surgeon: Josephine Igo, DO;  Location: [facility];  Service: Pulmonary;;   BRONCHIAL NEEDLE ASPIRATION BIOPSY  07/09/2022   Procedure: BRONCHIAL NEEDLE ASPIRATION BIOPSIES;  Surgeon: Josephine Igo, DO;  Location: [facility];  Service: Pulmonary;;   CHOLECYSTECTOMY     FIDUCIAL MARKER PLACEMENT  07/09/2022   Procedure: FIDUCIAL MARKER PLACEMENT;  Surgeon: Josephine Igo, DO;  Location: [facility];  Service: Pulmonary;;   FRACTURE SURGERY Left    arm   OPEN REDUCTION INTERNAL FIXATION (ORIF) PROXIMAL PHALANX Left 04/11/2017   Procedure: OPEN REDUCTION INTERNAL FIXATION (ORIF) PROXIMAL PHALANX;  Surgeon: Dairl Ponder, MD;  Location: [facility];  Service: Orthopedics;  Laterality: Left;   RESECTION OF MEDIASTINAL MASS N/A 02/18/2019   Procedure: RESECTION OF MEDIASTINAL MASS;  Surgeon: Kerin Perna, MD;  Location: [facility] OR;  Service: Thoracic;  Laterality: N/A;   STERNOTOMY N/A 02/18/2019   Procedure: STERNOTOMY;  Surgeon: Kerin Perna, MD;  Location: [facility] OR;  Service: Thoracic;  Laterality: N/A;   VIDEO BRONCHOSCOPY WITH ENDOBRONCHIAL ULTRASOUND Bilateral 07/09/2022   Procedure: VIDEO BRONCHOSCOPY WITH ENDOBRONCHIAL ULTRASOUND;  Surgeon: Josephine Igo, DO;  Location: [facility];  Service: Pulmonary;  Laterality: Bilateral;    Family History  Problem Relation Age of Onset   Diabetes Mellitus II Sister    Diabetes Mellitus II Mother    Hypertension Brother    Hypertension Sister     Social History Social History   Tobacco Use   Smoking status: Every Day    Packs/day: 1.50    Years: 50.00    Additional pack years: 0.00    Total pack years: 75.00    Types:  Cigarettes    Start date: 01/09/1971   Smokeless tobacco: Never   Tobacco comments:    3 cigarettes smoked daily [facility], 07/26/22  Vaping Use   Vaping Use: Never used  Substance Use Topics   Alcohol use: Yes    Comment: occ   Drug use: No    Current Outpatient Medications  Medication Sig Dispense Refill   albuterol (PROVENTIL HFA;VENTOLIN HFA) 108 (90 Base) MCG/ACT inhaler Inhale 2 puffs into the lungs every 4 (four) hours as needed for wheezing or shortness of breath (cough, shortness of  breath or wheezing.). (Patient taking differently: Inhale 2 puffs into the lungs every 4 (four) hours as needed for wheezing or shortness of breath (cough).) 1 Inhaler 3   alendronate (FOSAMAX) 70 MG tablet Take 70 mg by mouth once a week. Wednesday     amLODipine (NORVASC) 5 MG tablet Take 5 mg by mouth daily.     citalopram (CELEXA) 20 MG tablet Take 1 tablet (20 mg total) by mouth daily. 30 tablet 0   fluticasone furoate-vilanterol (BREO ELLIPTA) 100-25 MCG/ACT AEPB 1 puff Inhalation Once a day     gabapentin (NEURONTIN) 300 MG capsule Take 300 mg by mouth daily.     ipratropium-albuterol (DUONEB) 0.5-2.5 (3) MG/3ML SOLN Take 3 mLs by nebulization every 6 (six) hours as needed (wheezing, shortness of breath).     lisinopril (ZESTRIL) 40 MG tablet Take 40 mg by mouth daily.     lovastatin (MEVACOR) 40 MG tablet Take 40 mg by mouth daily.     omeprazole (PRILOSEC) 20 MG capsule Take 20 mg by mouth daily.     oxybutynin (DITROPAN) 5 MG tablet Take 5 mg by mouth 3 (three) times daily.      TRELEGY ELLIPTA 100-62.5-25 MCG/ACT AEPB Inhale 1 puff into the lungs daily.     No current facility-administered medications for this visit.    No Known Allergies  Review of Systems  Respiratory:  Positive for cough and shortness of breath.   Cardiovascular:  Negative for chest pain.  Musculoskeletal:  Positive for arthralgias and back pain.    BP 133/76   Pulse 60   Resp 18   Ht 5\' 7"  (1.702 m)   Wt 145 lb (65.8 kg)   SpO2 99%   BMI 22.71 kg/m  Physical Exam Vitals reviewed.  Constitutional:      General: She is not in acute distress. HENT:     Head: Normocephalic and atraumatic.  Eyes:     Extraocular Movements: Extraocular movements intact.  Cardiovascular:     Rate and Rhythm: Normal rate and regular rhythm.     Heart sounds: No murmur heard.    Comments: Well-healed sternotomy scar Pulmonary:     Effort: Pulmonary effort is normal. No respiratory distress.     Comments: Very faint  breath sounds bilaterally Musculoskeletal:        General: Deformity (Kyphoscoliosis) present.  Lymphadenopathy:     Cervical: No cervical adenopathy.  Skin:    General: Skin is warm and dry.  Neurological:     General: No focal deficit present.     Mental Status: She is alert and oriented to person, place, and time.     Cranial Nerves: No cranial nerve deficit.    Diagnostic Tests: NUCLEAR MEDICINE PET SKULL BASE TO THIGH   TECHNIQUE: 7.14 mCi F-18 FDG was injected intravenously. Full-ring PET imaging was performed from the skull base to thigh after the radiotracer. CT data was obtained and  used for attenuation correction and anatomic localization.   Fasting blood glucose: 98 mg/dl   COMPARISON:  Multiple priors including chest CT May 07, 2022 and MRI abdomen March 07, 2022   FINDINGS: Mediastinal blood pool activity: SUV max 2.2   Liver activity: SUV max NA   NECK: No hypermetabolic cervical adenopathy   Incidental CT findings: None.   CHEST: Spiculated hypermetabolic left upper lobe pulmonary nodule measures 11 mm on image 40/7 with a max SUV of 3.9.   No hypermetabolic thoracic adenopathy.   Incidental CT findings: Mucus in the bronchus intermedius with distal atelectasis and bronchial wall thickening. Prior median sternotomy and thymoma resection. Dense calcifications of the aorta, branch vessels and coronary arteries.   ABDOMEN/PELVIS: No abnormal hypermetabolic activity within the liver, pancreas, adrenal glands, or spleen. No hypermetabolic lymph nodes in the abdomen or pelvis.   Incidental CT findings: Aortic atherosclerosis. Gallbladder surgically absent. Uterine leiomyomas some of which are calcified. Right upper pole renal lesion and left interpolar renal lesion previously characterized as Bosniak category 2 renal cysts on MRI March 07, 2022. Sigmoid colonic diverticulosis without findings of acute diverticulitis   SKELETON: No focal  hypermetabolic activity to suggest skeletal metastasis.   Incidental CT findings: Similar compression deformity/endplate irregularities at T8, T9, T11 and L1 without abnormal FDG avidity. L5 vertebral body hemangioma. Diffuse demineralization of bone. Multilevel degenerative changes spine.   IMPRESSION: 1. Spiculated hypermetabolic 11 mm left upper lobe pulmonary nodule is compatible with primary bronchogenic neoplasm. 2. No hypermetabolic thoracic adenopathy. 3. No evidence of hypermetabolic metastatic disease. 4.  Aortic Atherosclerosis (ICD10-I70.0).     Electronically Signed   By: Maudry Mayhew M.D.   On: 05/17/2022 13:14 I personally reviewed the PET/CT and CT images.  11 mm spiculated nodule in the left upper lobe was hypermetabolic.  No evidence of metastatic disease.  CT from April shows right lower lobe pneumonia as well.  Multiple compression fractures of the thoracic spine.  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  She has a history of heavy tobacco abuse and has severe COPD.  She is still smoking.  FEV1 is 38% of predicted and actually got worse with bronchodilators.   Reviewing her films, surgery would require a lobectomy.  She does not have adequate respiratory reserve to tolerate the operation.  She would be better served being treated with radiation.  Plan: Follow-up with radiation oncology I will be happy to see Holly May back anytime in the future if I can be of any further assistance with her care  Loreli Slot, MD [facility] [phone]

## 2022-08-08 NOTE — Progress Notes (Signed)
We received notification that Dr. Dorris Fetch is recommending SBRT as she is not a surgical candidate. Simulation orders were placed. The patient will be contacted to coordinate treatment planning by our simulation department.

## 2022-08-16 ENCOUNTER — Ambulatory Visit
Admission: RE | Admit: 2022-08-16 | Discharge: 2022-08-16 | Disposition: A | Discharge status: Home / Self Care | Payer: Medicare HMO | Source: Ambulatory Visit | Attending: Radiation Oncology | Admitting: Radiation Oncology

## 2022-08-16 DIAGNOSIS — Z510 Encounter for antineoplastic radiation therapy: Secondary | ICD-10-CM | POA: Diagnosis not present

## 2022-08-16 DIAGNOSIS — C3412 Malignant neoplasm of upper lobe, left bronchus or lung: Secondary | ICD-10-CM | POA: Diagnosis present

## 2022-08-16 NOTE — Progress Notes (Signed)
  Radiation Oncology         [phone] ________________________________  Name: Holly May MRN: [id number]  Date: 08/16/2022  DOB: 14-Feb-1951  STEREOTACTIC BODY RADIOTHERAPY SIMULATION AND TREATMENT PLANNING NOTE    ICD-10-CM   1. Malignant neoplasm of upper lobe bronchus, left (HCC)  C34.12       DIAGNOSIS:  72 y/o woman with a newly diagnosed stage IA NSCLC, adenocarcinoma of the left upper lobe lung.   NARRATIVE:  The patient was brought to the CT Simulation planning suite.  Identity was confirmed.  All relevant records and images related to the planned course of therapy were reviewed.  The patient freely provided informed written consent to proceed with treatment after reviewing the details related to the planned course of therapy. The consent form was witnessed and verified by the simulation staff.  Then, the patient was set-up in a stable reproducible  supine position for radiation therapy.  A BodyFix immobilization pillow was fabricated for reproducible positioning.  Then I personally applied the abdominal compression paddle to limit respiratory excursion.  4D respiratoy motion management CT images were obtained.  Surface markings were placed.  The CT images were loaded into the planning software.  Then, using Cine, MIP, and standard views, the internal target volume (ITV) and planning target volumes (PTV) were delinieated, and avoidance structures were contoured.  Treatment planning then occurred.  The radiation prescription was entered and confirmed.  A total of two complex treatment devices were fabricated in the form of the BodyFix immobilization pillow and a neck accuform cushion.  I have requested : 3D Simulation  I have requested a DVH of the following structures: Heart, Lungs, Esophagus, Chest Wall, Brachial Plexus, Major Blood Vessels, and targets.  SPECIAL TREATMENT PROCEDURE:  The planned course of therapy using radiation constitutes a special treatment procedure. Special  care is required in the management of this patient for the following reasons. This treatment constitutes a Special Treatment Procedure for the following reason: [ High dose per fraction requiring special monitoring for increased toxicities of treatment including daily imaging..  The special nature of the planned course of radiotherapy will require increased physician supervision and oversight to ensure patient's safety with optimal treatment outcomes.  This requires extended time and effort.    RESPIRATORY MOTION MANAGEMENT SIMULATION:  In order to account for effect of respiratory motion on target structures and other organs in the planning and delivery of radiotherapy, this patient underwent respiratory motion management simulation.  To accomplish this, when the patient was brought to the CT simulation planning suite, 4D respiratoy motion management CT images were obtained.  The CT images were loaded into the planning software.  Then, using a variety of tools including Cine, MIP, and standard views, the target volume and planning target volumes (PTV) were delineated.  Avoidance structures were contoured.  Treatment planning then occurred.  Dose volume histograms were generated and reviewed for each of the requested structure.  The resulting plan was carefully reviewed and approved today.  PLAN:  The patient will receive 54 Gy in 3 fractions.  ________________________________  Artist Pais Kathrynn Running, M.D.

## 2022-08-20 NOTE — Progress Notes (Signed)
[facility] [address], [place] [address]   Clinic Day:  08/21/2022  Referring physician: Adriana Mccallum*  Patient Care Team: Alvina Filbert, MD as PCP - General (Internal Medicine) Doreatha Massed, MD as Medical Oncologist (Medical Oncology) Therese Sarah, RN as Oncology Nurse Navigator (Medical Oncology)   ASSESSMENT & PLAN:   Assessment:  1.  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  Indeterminate 10 x 6 mm right lower lobe nodule. - PET scan (05/16/2022): Hypermetabolic activity in the 11 mm LUL nodule.  No other adenopathy. - Bronchoscopy by Dr. Tonia Brooms (07/09/2022) - Pathology of FNA: Adenocarcinoma.  FNA of 11 L lymph node negative. - Patient seen by Dr. Dorris Fetch and was not felt to be a surgical candidate. - Seen by Dr. Kathrynn Running and plan for 3-5 courses of SBRT.  Also evaluated by Dr. Arbutus Ped. - XRT is to start on 08/28/2022.  2.  Social/family history: - She lives with her son.  Able to do ADLs but slowly.  Able to do some IADLs.  Worked in the [employer] for 30 years prior to retirement.  No asbestos exposure.  She smokes 1 pack/day for past 50 years.  She is trying to cut back and is smoking less than 5 cigarettes a day and using nicotine gum at this time. - Niece had lung cancer.  Plan:  1.  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  We reviewed pathology report in detail. - She is scheduled to have brain MRI done later this week. - As she is not a candidate for surgery, SBRT is very appropriate.  She will proceed with it. - I will schedule for follow-up CT chest with contrast in 4 months. - We have discussed the surveillance plan with CT imaging every 6 months.  Orders Placed This Encounter  Procedures   CT Chest W Contrast    Standing Status:    Future    Standing Expiration Date:   08/21/2023    Order Specific Question:   If indicated for the ordered procedure, I authorize the administration of contrast media per Radiology protocol    Answer:   Yes    Order Specific Question:   Does the patient have a contrast media/X-ray dye allergy?    Answer:   No    Order Specific Question:   Preferred imaging location?    Answer:   [facility]    Order Specific Question:   Release to patient    Answer:   Immediate [1]   CBC with Differential    Standing Status:   Future    Standing Expiration Date:   08/21/2023   Comprehensive metabolic panel    Standing Status:   Future    Standing Expiration Date:   08/21/2023      I,Katie Daubenspeck,acting as a scribe for Doreatha Massed, MD.,have documented all relevant documentation on the behalf of Doreatha Massed, MD,as directed by  Doreatha Massed, MD while in the presence of Doreatha Massed, MD.   I, Doreatha Massed MD, have reviewed the above documentation for accuracy and completeness, and I agree with the above.   Doreatha Massed, MD   5/22/20245:25 PM  CHIEF COMPLAINT/PURPOSE OF CONSULT:   Diagnosis: stage IA NSCLC, adenocarcinoma of LUL   Cancer Staging  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) Staging form: Lung, AJCC 8th Edition - Clinical stage from 08/20/2022: cT1, cN0, cM0 - Unsigned    Prior Therapy: none  Current Therapy:  pending SBRT   HISTORY OF PRESENT ILLNESS:   Oncology History   No history exists.      Holly May is a 72 y.o. female presenting to clinic today for evaluation of lung cancer at the request of Cassie Heilingoetter, PA-C with Dr. Arbutus Ped.  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 She also has a history of thymoma, s/p sternotomy and mediastinal mass resection on 02/18/19 under the care of Dr. 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   She  presented to her PCP with one month history of cough and SOB. Chest CT on 02/07/22 showed: increase in size of LUL nodule from prior CT in 12/2018, now 11 mm; new indeterminate 10 mm RLL nodule and 2 mm lingula nodule. Also noted were an indeterminate 2.1 cm right upper pole renal lesion and new compression deformities of T9 and T11 (with unchanged deformities at T8 and L1). She was referred to pulmonology and saw Dr. Sherene Sires. Repeat chest CT on 05/07/22 showed: enlargement of LUL nodule; improvement in RLL; no mediastinal adenopathy. PET scan on 05/16/22 confirmed hypermetabolism to the now 11 mm LUL nodule and no adenopathy or evidence of metastatic disease.  She proceeded to bronchoscopy on 07/09/22 under Dr. Tonia Brooms. Cytology from LUL confirmed non-small cell carcinoma, consistent with adenocarcinoma. Biopsied 11L node was negative. She met with Dr. Kathrynn Running in radiation oncology on 07/30/22, Dr. Arbutus Ped in medical oncology on 08/01/22, and Dr. Dorris Fetch in cardiothoracic surgery on 08/08/22. Given her early stage disease, chemotherapy was not recommended. She was found to not be a surgical candidate and proceeded with SBRT CT simulation on 08/16/22.  She is scheduled for staging brain MRI on 08/23/22.  Today, she states that she is doing well overall. Her appetite level is at 50%. Her energy level is at 50%.   PAST MEDICAL HISTORY:   Past Medical History: Past Medical History:  Diagnosis Date   Arthritis    Cancer (HCC)    lung   COPD (chronic obstructive pulmonary disease) (HCC)    Depression    Dyspnea    Essential hypertension, benign    Type 2 diabetes mellitus (HCC)    Urinary incontinence     Surgical History: Past Surgical History:  Procedure Laterality Date   BRONCHIAL BIOPSY  07/09/2022   Procedure: BRONCHIAL BIOPSIES;  Surgeon: Josephine Igo, DO;  Location: [facility];  Service: Pulmonary;;   BRONCHIAL BRUSHINGS  07/09/2022   Procedure: BRONCHIAL BRUSHINGS;  Surgeon: Josephine Igo,  DO;  Location: [facility];  Service: Pulmonary;;   BRONCHIAL NEEDLE ASPIRATION BIOPSY  07/09/2022   Procedure: BRONCHIAL NEEDLE ASPIRATION BIOPSIES;  Surgeon: Josephine Igo, DO;  Location: [facility];  Service: Pulmonary;;   CHOLECYSTECTOMY     FIDUCIAL MARKER PLACEMENT  07/09/2022   Procedure: FIDUCIAL MARKER PLACEMENT;  Surgeon: Josephine Igo, DO;  Location: [facility];  Service: Pulmonary;;   FRACTURE SURGERY Left    arm   OPEN REDUCTION INTERNAL FIXATION (ORIF) PROXIMAL PHALANX Left 04/11/2017   Procedure: OPEN REDUCTION INTERNAL FIXATION (ORIF) PROXIMAL PHALANX;  Surgeon: Dairl Ponder, MD;  Location: [facility];  Service: Orthopedics;  Laterality: Left;   RESECTION OF MEDIASTINAL MASS N/A 02/18/2019   Procedure: RESECTION OF MEDIASTINAL  MASS;  Surgeon: Kerin Perna, MD;  Location: [facility] OR;  Service: Thoracic;  Laterality: N/A;   STERNOTOMY N/A 02/18/2019   Procedure: STERNOTOMY;  Surgeon: Donata Clay, Theron Arista, MD;  Location: [facility] OR;  Service: Thoracic;  Laterality: N/A;   VIDEO BRONCHOSCOPY WITH ENDOBRONCHIAL ULTRASOUND Bilateral 07/09/2022   Procedure: VIDEO BRONCHOSCOPY WITH ENDOBRONCHIAL ULTRASOUND;  Surgeon: Josephine Igo, DO;  Location: [facility];  Service: Pulmonary;  Laterality: Bilateral;    Social History: Social History   Socioeconomic History   Marital status: Widowed    Spouse name: Not on file   Number of children: Not on file   Years of education: Not on file   Highest education level: Not on file  Occupational History   Occupation: retired  Tobacco Use   Smoking status: Every Day    Packs/day: 1.50    Years: 50.00    Additional pack years: 0.00    Total pack years: 75.00    Types: Cigarettes    Start date: 01/09/1971   Smokeless tobacco: Never   Tobacco comments:    3 cigarettes smoked daily ARJ, 07/26/22  Vaping Use   Vaping Use: Never used  Substance and Sexual Activity   Alcohol use: Yes    Comment: occ   Drug use: No    Sexual activity: Not on file  Other Topics Concern   Not on file  Social History Narrative   Not on file   Social Determinants of Health   Financial Resource Strain: Not on file  Food Insecurity: Food Insecurity Present (08/21/2022)   Hunger Vital Sign    Worried About Running Out of Food in the Last Year: Sometimes true    Ran Out of Food in the Last Year: Sometimes true  Transportation Needs: No Transportation Needs (08/21/2022)   PRAPARE - Administrator, Civil Service (Medical): No    Lack of Transportation (Non-Medical): No  Physical Activity: Not on file  Stress: Not on file  Social Connections: Not on file  Intimate Partner Violence: Not At Risk (08/21/2022)   Humiliation, Afraid, Rape, and Kick questionnaire    Fear of Current or Ex-Partner: No    Emotionally Abused: No    Physically Abused: No    Sexually Abused: No    Family History: Family History  Problem Relation Age of Onset   Diabetes Mellitus II Sister    Diabetes Mellitus II Mother    Hypertension Brother    Hypertension Sister     Current Medications:  Current Outpatient Medications:    albuterol (PROVENTIL HFA;VENTOLIN HFA) 108 (90 Base) MCG/ACT inhaler, Inhale 2 puffs into the lungs every 4 (four) hours as needed for wheezing or shortness of breath (cough, shortness of breath or wheezing.). (Patient taking differently: Inhale 2 puffs into the lungs every 4 (four) hours as needed for wheezing or shortness of breath (cough).), Disp: 1 Inhaler, Rfl: 3   alendronate (FOSAMAX) 70 MG tablet, Take 70 mg by mouth once a week. Wednesday, Disp: , Rfl:    amLODipine (NORVASC) 5 MG tablet, Take 5 mg by mouth daily., Disp: , Rfl:    citalopram (CELEXA) 20 MG tablet, Take 1 tablet (20 mg total) by mouth daily., Disp: 30 tablet, Rfl: 0   gabapentin (NEURONTIN) 300 MG capsule, Take 300 mg by mouth daily., Disp: , Rfl:    ipratropium-albuterol (DUONEB) 0.5-2.5 (3) MG/3ML SOLN, Take 3 mLs by nebulization every 6  (six) hours as needed (wheezing, shortness of breath)., Disp: , Rfl:  lisinopril (ZESTRIL) 40 MG tablet, Take 40 mg by mouth daily., Disp: , Rfl:    lovastatin (MEVACOR) 40 MG tablet, Take 40 mg by mouth daily., Disp: , Rfl:    omeprazole (PRILOSEC) 20 MG capsule, Take 20 mg by mouth daily., Disp: , Rfl:    oxybutynin (DITROPAN) 5 MG tablet, Take 5 mg by mouth 3 (three) times daily. , Disp: , Rfl:    TRELEGY ELLIPTA 100-62.5-25 MCG/ACT AEPB, Inhale 1 puff into the lungs daily., Disp: , Rfl:    Allergies: No Known Allergies  REVIEW OF SYSTEMS:   Review of Systems  Constitutional:  Negative for chills, fatigue and fever.  HENT:   Negative for lump/mass, mouth sores, nosebleeds, sore throat and trouble swallowing.   Eyes:  Negative for eye problems.  Respiratory:  Positive for shortness of breath. Negative for cough.   Cardiovascular:  Negative for chest pain, leg swelling and palpitations.  Gastrointestinal:  Positive for constipation. Negative for abdominal pain, diarrhea, nausea and vomiting.  Genitourinary:  Negative for bladder incontinence, difficulty urinating, dysuria, frequency, hematuria and nocturia.   Musculoskeletal:  Negative for arthralgias, back pain, flank pain, myalgias and neck pain.  Skin:  Negative for itching and rash.  Neurological:  Positive for headaches and numbness. Negative for dizziness.  Hematological:  Does not bruise/bleed easily.  Psychiatric/Behavioral:  Positive for sleep disturbance. Negative for depression and suicidal ideas. The patient is not nervous/anxious.   All other systems reviewed and are negative.    VITALS:   Blood pressure (!) 154/73, pulse 79, temperature 97.9 F (36.6 C), temperature source Oral, resp. rate 16, height 5\' 7"  (1.702 m), weight 143 lb 3.2 oz (65 kg), SpO2 98 %.  Wt Readings from Last 3 Encounters:  08/21/22 143 lb 6.4 oz (65 kg)  08/21/22 143 lb 3.2 oz (65 kg)  08/08/22 145 lb (65.8 kg)    Body mass index is 22.43  kg/m.  Performance status (ECOG): 1 - Symptomatic but completely ambulatory  PHYSICAL EXAM:   Physical Exam Vitals and nursing note reviewed. Exam conducted with a chaperone present.  Constitutional:      Appearance: Normal appearance.  Cardiovascular:     Rate and Rhythm: Normal rate and regular rhythm.     Pulses: Normal pulses.     Heart sounds: Normal heart sounds.  Pulmonary:     Effort: Pulmonary effort is normal.     Breath sounds: Normal breath sounds.  Abdominal:     Palpations: Abdomen is soft. There is no hepatomegaly, splenomegaly or mass.     Tenderness: There is no abdominal tenderness.  Musculoskeletal:     Right lower leg: No edema.     Left lower leg: No edema.  Lymphadenopathy:     Cervical: No cervical adenopathy.     Right cervical: No superficial, deep or posterior cervical adenopathy.    Left cervical: No superficial, deep or posterior cervical adenopathy.     Upper Body:     Right upper body: No supraclavicular or axillary adenopathy.     Left upper body: No supraclavicular or axillary adenopathy.  Neurological:     General: No focal deficit present.     Mental Status: She is alert and oriented to person, place, and time.  Psychiatric:        Mood and Affect: Mood normal.        Behavior: Behavior normal.     LABS:      Latest Ref Rng & Units 08/01/2022  7:38 AM 07/17/2022    4:55 AM 07/16/2022   12:40 PM  CBC  WBC 4.0 - 10.5 K/uL 10.8  13.8  21.3   Hemoglobin 12.0 - 15.0 g/dL 60.4  54.0  98.1   Hematocrit 36.0 - 46.0 % 39.4  36.7  37.7   Platelets 150 - 400 K/uL 217  155  271       Latest Ref Rng & Units 08/01/2022    7:38 AM 07/17/2022    4:55 AM 07/16/2022   12:40 PM  CMP  Glucose 70 - 99 mg/dL 191  478  295   BUN 8 - 23 mg/dL 19  21  37   Creatinine 0.44 - 1.00 mg/dL 6.21  3.08  6.57   Sodium 135 - 145 mmol/L 140  135  135   Potassium 3.5 - 5.1 mmol/L 4.1  4.4  2.7   Chloride 98 - 111 mmol/L 105  104  96   CO2 22 - 32 mmol/L 29  21   26    Calcium 8.9 - 10.3 mg/dL 9.1  8.2  8.8   Total Protein 6.5 - 8.1 g/dL 6.6  5.5    Total Bilirubin 0.3 - 1.2 mg/dL 0.5  0.9    Alkaline Phos 38 - 126 U/L 64  52    AST 15 - 41 U/L 12  18    ALT 0 - 44 U/L 11  12       No results found for: "CEA1", "CEA" / No results found for: "CEA1", "CEA" No results found for: "PSA1" No results found for: "QIO962" No results found for: "CAN125"  No results found for: "TOTALPROTELP", "ALBUMINELP", "A1GS", "A2GS", "BETS", "BETA2SER", "GAMS", "MSPIKE", "SPEI" Lab Results  Component Value Date   TIBC 215 (L) 03/31/2013   FERRITIN 784 (H) 03/31/2013   IRONPCTSAT 5 (L) 03/31/2013   No results found for: "LDH"   STUDIES:   No results found.

## 2022-08-20 NOTE — Progress Notes (Signed)
Holly May, female    DOB: 08-12-1950    MRN: [id number]   Brief patient profile:  24  yobf  active smoker  referred to pulmonary clinic in [place]  04/05/2022 by Alvina Filbert  for SPN in setting  variable doe attributed to  Copd / AB  x  2017    S/p thymomectomy by Morton Peters 2020 s adjuctive rx with preop pfts c/w GOLD 3 copd    History of Present Illness  04/05/2022  Pulmonary/ 1st office eval/ Anmol Fleck / [facility] on acei and DPI  Chief Complaint  Patient presents with   Consult    SOB   Dyspnea:  mb and back 255ft  each way level and so short of breath  sometimes has to stop but varies s pattern  Cough: clear mucus variably producttive esp p lie down sometimes wakes her up p asleep/ flat 2 pillows  SABA use: 3 x weekly  02: none Rec We will walk you today to see if you qualify for 0xygen  Start Trelegy 100 one click each For cough / congestion > mucinex 1200 mg every 12 hours as needed  Levaquin 500 mg one daily x 10 days  CT chest needs to be done after Feb 3rd 2024  The key is to stop smoking completely before smoking completely stops you! When you start you new month of medications need to substitute olmesartan 40 mg for the lisinopril 40 mg daily   Please schedule a follow up office visit in 4-5  weeks, sooner if needed  with all medications /inhalers/ solutions in hand so we can verify exactly what you are taking. This includes all medications from all doctors and over the counters      05/16/2022  f/u ov/[place] office/Bedelia Pong re: GOLD 3/ still smoking  maint on Trelegy 100 one each am  / did not bring meds  Chief Complaint  Patient presents with   Follow-up    Doing well since last ov   Dyspnea:  easier to push buggy at [employer] vs prior to trelegy rx  Cough: variable slt grey esp am / no blood  Sleeping: none  SABA use: did not bring as rec  02: none  Covid status: says all  Rec I will call the results of the PET scan and we'll go from  there clinical stage Ia adenocarcinoma of the left upper lobe. > SBRT  /turned down for resection by Dorris Fetch    08/21/2022  f/u ov/[place] office/Raphael Fitzpatrick re: GOLD 3 copd  maint on trelegy  Chief Complaint  Patient presents with   Follow-up    Pt f/u states that shse is doing well she begins her cancer treatment next week, she does believe that her breathing is getting better  Dyspnea:  [employer] walking ok Cough: minimal / thinks she's no long on acei  Sleeping: flat bed 3 pillows baseline  SABA use: 3 x weekly / neb rarely  02: none     No obvious day to day or daytime variability or assoc excess/ purulent sputum or mucus plugs or hemoptysis or cp or chest tightness, subjective wheeze or overt sinus or hb symptoms.   Sleeping  without nocturnal  or early am exacerbation  of respiratory  c/o's or need for noct saba. Also denies any obvious fluctuation of symptoms with weather or environmental changes or other aggravating or alleviating factors except as outlined above   No unusual exposure hx or h/o childhood pna/ asthma  or knowledge of premature birth.  Current Allergies, Complete Past Medical History, Past Surgical History, Family History, and Social History were reviewed in [employer] record.  ROS  The following are not active complaints unless bolded Hoarseness, sore throat, dysphagia, dental problems, itching, sneezing,  nasal congestion or discharge of excess mucus or purulent secretions, ear ache,   fever, chills, sweats, unintended wt loss or wt gain, classically pleuritic or exertional cp,  orthopnea pnd or arm/hand swelling  or leg swelling, presyncope, palpitations, abdominal pain, anorexia, nausea, vomiting, diarrhea  or change in bowel habits or change in bladder habits, change in stools or change in urine, dysuria, hematuria,  rash, arthralgias, visual complaints, headache, numbness, weakness or ataxia or problems with walking or coordination,   change in mood or  memory.        Current Meds - - NOTE:   Unable to verify as accurately reflecting what pt takes    Medication Sig   albuterol (PROVENTIL HFA;VENTOLIN HFA) 108 (90 Base) MCG/ACT inhaler Inhale 2 puffs into the lungs every 4 (four) hours as needed for wheezing or shortness of breath (cough, shortness of breath or wheezing.). (Patient taking differently: Inhale 2 puffs into the lungs every 4 (four) hours as needed for wheezing or shortness of breath (cough).)   alendronate (FOSAMAX) 70 MG tablet Take 70 mg by mouth once a week. Wednesday   amLODipine (NORVASC) 5 MG tablet Take 5 mg by mouth daily.   citalopram (CELEXA) 20 MG tablet Take 1 tablet (20 mg total) by mouth daily.        gabapentin (NEURONTIN) 300 MG capsule Take 300 mg by mouth daily.   ipratropium-albuterol (DUONEB) 0.5-2.5 (3) MG/3ML SOLN Take 3 mLs by nebulization every 6 (six) hours as needed (wheezing, shortness of breath).   lisinopril (ZESTRIL) 40 MG tablet Thinks she's stopped this    lovastatin (MEVACOR) 40 MG tablet Take 40 mg by mouth daily.   omeprazole (PRILOSEC) 20 MG capsule Take 20 mg by mouth daily.   oxybutynin (DITROPAN) 5 MG tablet Take 5 mg by mouth 3 (three) times daily.    TRELEGY ELLIPTA 100-62.5-25 MCG/ACT AEPB Inhale 1 puff into the lungs daily.                       Past Medical History:  Diagnosis Date   Arthritis    COPD (chronic obstructive pulmonary disease) (HCC)    Depression    Dyspnea    Essential hypertension, benign    Type 2 diabetes mellitus (HCC)    Urinary incontinence        Objective:     08/21/2022        143  05/16/22 135 lb 12.8 oz (61.6 kg)  05/14/22 134 lb (60.8 kg)  04/05/22 134 lb 6.4 oz (61 kg)     Vital signs reviewed  08/21/2022  - Note at rest 02 sats  95% on RA   General appearance:    amb bf nad       HEENT : Oropharynx clear/ edentulous       NECK :  without  apparent JVD/ palpable Nodes/TM    LUNGS: no acc muscle use,  Mild  barrel  contour chest wall with bilateral  Distant bs s audible wheeze and  without cough on insp or exp maneuvers  and mild  Hyperresonant  to  percussion bilaterally     CV:  RRR  no s3 or murmur or  increase in P2, and no edema   ABD:  soft and nontender with pos end  insp Hoover's  in the supine position.  No bruits or organomegaly appreciated   MS:  Nl gait/ ext warm without deformities Or obvious joint restrictions  calf tenderness, cyanosis or clubbing     SKIN: warm and dry without lesions    NEURO:  alert, approp, nl sensorium with  no motor or cerebellar deficits apparent.        Assessment

## 2022-08-21 ENCOUNTER — Encounter: Payer: Self-pay | Admitting: Hematology

## 2022-08-21 ENCOUNTER — Ambulatory Visit: Payer: Medicare HMO | Admitting: Internal Medicine

## 2022-08-21 ENCOUNTER — Ambulatory Visit (INDEPENDENT_AMBULATORY_CARE_PROVIDER_SITE_OTHER): Payer: Medicare HMO | Admitting: Internal Medicine

## 2022-08-21 ENCOUNTER — Inpatient Hospital Stay (HOSPITAL_BASED_OUTPATIENT_CLINIC_OR_DEPARTMENT_OTHER): Payer: Medicare HMO | Admitting: Hematology

## 2022-08-21 ENCOUNTER — Encounter: Payer: Self-pay | Admitting: Internal Medicine

## 2022-08-21 VITALS — BP 148/82 | HR 73 | Ht 67.0 in | Wt 143.4 lb

## 2022-08-21 VITALS — BP 154/73 | HR 79 | Temp 97.9°F | Resp 16 | Ht 67.0 in | Wt 143.2 lb

## 2022-08-21 DIAGNOSIS — F17210 Nicotine dependence, cigarettes, uncomplicated: Secondary | ICD-10-CM

## 2022-08-21 DIAGNOSIS — M8008XA Age-related osteoporosis with current pathological fracture, vertebra(e), initial encounter for fracture: Secondary | ICD-10-CM

## 2022-08-21 DIAGNOSIS — J449 Chronic obstructive pulmonary disease, unspecified: Secondary | ICD-10-CM

## 2022-08-21 DIAGNOSIS — I700 Atherosclerosis of aorta: Secondary | ICD-10-CM

## 2022-08-21 DIAGNOSIS — R61 Generalized hyperhidrosis: Secondary | ICD-10-CM

## 2022-08-21 DIAGNOSIS — Z8249 Family history of ischemic heart disease and other diseases of the circulatory system: Secondary | ICD-10-CM

## 2022-08-21 DIAGNOSIS — C3412 Malignant neoplasm of upper lobe, left bronchus or lung: Secondary | ICD-10-CM

## 2022-08-21 DIAGNOSIS — F32A Depression, unspecified: Secondary | ICD-10-CM

## 2022-08-21 DIAGNOSIS — Z9049 Acquired absence of other specified parts of digestive tract: Secondary | ICD-10-CM

## 2022-08-21 DIAGNOSIS — R197 Diarrhea, unspecified: Secondary | ICD-10-CM

## 2022-08-21 DIAGNOSIS — K449 Diaphragmatic hernia without obstruction or gangrene: Secondary | ICD-10-CM

## 2022-08-21 DIAGNOSIS — I10 Essential (primary) hypertension: Secondary | ICD-10-CM | POA: Diagnosis not present

## 2022-08-21 DIAGNOSIS — I2510 Atherosclerotic heart disease of native coronary artery without angina pectoris: Secondary | ICD-10-CM

## 2022-08-21 DIAGNOSIS — Z79899 Other long term (current) drug therapy: Secondary | ICD-10-CM

## 2022-08-21 DIAGNOSIS — I4891 Unspecified atrial fibrillation: Secondary | ICD-10-CM | POA: Diagnosis not present

## 2022-08-21 DIAGNOSIS — Z5941 Food insecurity: Secondary | ICD-10-CM

## 2022-08-21 DIAGNOSIS — Z833 Family history of diabetes mellitus: Secondary | ICD-10-CM

## 2022-08-21 DIAGNOSIS — J439 Emphysema, unspecified: Secondary | ICD-10-CM

## 2022-08-21 NOTE — Assessment & Plan Note (Signed)
4-5 min discussion re active cigarette smoking in addition to office E&M  Ask about tobacco use:   ongoing  Advise quitting   reviewed consequences of continued smoking = copd/ lung ca and matter of life or breath. Assess willingness:  Not fully committed at this point Assist in quit attempt:  Per PCP when ready Arrange follow up:   Follow up per Primary Care planned

## 2022-08-21 NOTE — Patient Instructions (Addendum)
Call us back with the spelling of the new blood pressure pill   Plan A = Automatic = Always=   Trelegy 100 one puff each am    Plan B = Backup (to supplement plan A, not to replace it) Only use your albuterol inhaler as a rescue medication to be used if you can't catch your breath by resting or doing a relaxed purse lip breathing pattern.  - The less you use it, the better it will work when you need it. - Ok to use the inhaler up to 2 puffs  every 4 hours if you must but call for appointment if use goes up over your usual need - Don't leave home without it !!  (think of it like the spare tire for your car)   Plan C = Crisis (instead of Plan B but only if Plan B stops working) - only use your albuterol nebulizer if you first try Plan B and it fails to help > ok to use the nebulizer up to every 4 hours but if start needing it regularly call for immediate appointment   Please schedule a follow up visit in 3 months but call sooner if needed  with all medications /inhalers/ solutions in hand so we can verify exactly what you are taking. This includes all medications from all doctors and over the counters

## 2022-08-21 NOTE — Patient Instructions (Addendum)
[facility]  Discharge Instructions  You were seen and examined today by Dr. Ellin Saba. Dr. Ellin Saba is a medical oncologist, meaning that he specializes in the treatment of cancer diagnoses. Dr. Ellin Saba discussed your past medical history, family history of cancers, and the events that led to you being here today.  You were referred to Dr. Ellin Saba due to a new diagnosis of Stage I Lung Cancer. This is typically treated with surgery for patients who can withstand surgery, Dr. Dorris Fetch does not believe you can withstand surgery. For this reason, you were referred to Radiation Oncology and they will treat you with targeted radiation to minimize the cancer as much as possible and prevent it from getting worse.  Dr. Ellin Saba does not recommend any additional treatment at this time in the form of chemotherapy. He will monitor you on a regular basis over the next several years.  Proceed with radiation as scheduled. In 4 months, Dr. Ellin Saba will repeat a CT scan of your chest.  Follow-up as scheduled.  Thank you for choosing [facility] to provide your oncology and hematology care.   To afford each patient quality time with our provider, please arrive at least 15 minutes before your scheduled appointment time. You may need to reschedule your appointment if you arrive late (10 or more minutes). Arriving late affects you and other patients whose appointments are after yours.  Also, if you miss three or more appointments without notifying the office, you may be dismissed from the clinic at the provider's discretion.    Again, thank you for choosing [facility]  Our hope is that these requests will decrease the amount of time that you wait before being seen by our physicians.   If you have a lab appointment with the Cancer Center - please note that after April 8th, all labs will be drawn in the cancer center.  You do not  have to check in or register with the main entrance as you have in the past but will complete your check-in at the cancer center.            _____________________________________________________________  Should you have questions after your visit to [facility], please contact our office at [phone] and follow the prompts.  Our office hours are 8:00 a.m. to 4:30 p.m. Monday - Thursday and 8:00 a.m. to 2:30 p.m. Friday.  Please note that voicemails left after 4:00 p.m. may not be returned until the following business day.  We are closed weekends and all major holidays.  You do have access to a nurse 24-7, just call the main number to the clinic [phone] and do not press any options, hold on the line and a nurse will answer the phone.    For prescription refill requests, have your pharmacy contact our office and allow 72 hours.    Masks are no longer required in the cancer centers. If you would like for your care team to wear a mask while they are taking care of you, please let them know. You may have one support person who is at least 70 years old accompany you for your appointments.

## 2022-08-21 NOTE — Assessment & Plan Note (Addendum)
Active smoker - PFT's  02/16/2019  FEV1 0.87 (39 % ) ratio 0.39  p 7 % improvement from saba p ? prior to study with DLCO  10.88 (49%)   and FV curve classically concave  - 04/05/2022  After extensive coaching inhaler device,  effectiveness =    75% with dpi > trelegy trial  - d/c ACE 04/05/2022  - 04/05/2022   Walked on RA  x  3  lap(s) =  approx 450  ft  @ mod pace, stopped due to end of study with lowest 02 sats 95% no sob   - PFT's  06/03/22  FEV1 0.95 (38 % ) ratio 0.54  p 0 % improvement from saba p 0 prior to study with DLCO  10.31 (49%)   and FV curve mildly concave/somewhat atyical and ERV 6% at wt 144     Group D (now reclassified as E) in terms of symptom/risk and laba/lama/ICS  therefore appropriate rx at this point >>>  trelegy  each am and approp saba   F/u 3 m, sooner prn          Each maintenance medication was reviewed in detail including emphasizing most importantly the difference between maintenance and prns and under what circumstances the prns are to be triggered using an action plan format where appropriate.  Total time for H and P, chart review, counseling, reviewing dpi/hfa device(s) and generating customized AVS unique to this office visit / same day charting = 20 min

## 2022-08-22 DIAGNOSIS — Z510 Encounter for antineoplastic radiation therapy: Secondary | ICD-10-CM | POA: Diagnosis not present

## 2022-08-23 ENCOUNTER — Ambulatory Visit (HOSPITAL_COMMUNITY)
Admission: RE | Admit: 2022-08-23 | Discharge: 2022-08-23 | Disposition: A | Discharge status: Home / Self Care | Payer: Medicare HMO | Source: Ambulatory Visit | Attending: Acute Care | Admitting: Acute Care

## 2022-08-23 DIAGNOSIS — C3490 Malignant neoplasm of unspecified part of unspecified bronchus or lung: Secondary | ICD-10-CM | POA: Diagnosis present

## 2022-08-23 MED ORDER — GADOBUTROL 1 MMOL/ML IV SOLN
6.5000 mL | Freq: Once | INTRAVENOUS | Status: AC | PRN
  Administered 2022-08-23: 6.5 mL via INTRAVENOUS

## 2022-08-28 ENCOUNTER — Ambulatory Visit
Admission: RE | Admit: 2022-08-28 | Discharge: 2022-08-28 | Disposition: A | Discharge status: Home / Self Care | Payer: Medicare HMO | Source: Ambulatory Visit | Attending: Radiation Oncology | Admitting: Radiation Oncology

## 2022-08-28 ENCOUNTER — Other Ambulatory Visit: Payer: Self-pay

## 2022-08-28 DIAGNOSIS — Z510 Encounter for antineoplastic radiation therapy: Secondary | ICD-10-CM | POA: Diagnosis not present

## 2022-08-28 DIAGNOSIS — C3412 Malignant neoplasm of upper lobe, left bronchus or lung: Secondary | ICD-10-CM

## 2022-08-28 LAB — RAD ONC ARIA SESSION SUMMARY
Course Elapsed Days: 0
Plan Fractions Treated to Date: 1
Plan Prescribed Dose Per Fraction: 18 Gy
Plan Total Fractions Prescribed: 3
Plan Total Prescribed Dose: 54 Gy
Reference Point Dosage Given to Date: 18 Gy
Reference Point Session Dosage Given: 18 Gy
Session Number: 1

## 2022-08-30 ENCOUNTER — Ambulatory Visit
Admission: RE | Admit: 2022-08-30 | Discharge: 2022-08-30 | Disposition: A | Discharge status: Home / Self Care | Payer: Medicare HMO | Source: Ambulatory Visit | Attending: Radiation Oncology | Admitting: Radiation Oncology

## 2022-08-30 ENCOUNTER — Other Ambulatory Visit: Payer: Self-pay

## 2022-08-30 DIAGNOSIS — C3412 Malignant neoplasm of upper lobe, left bronchus or lung: Secondary | ICD-10-CM

## 2022-08-30 DIAGNOSIS — Z510 Encounter for antineoplastic radiation therapy: Secondary | ICD-10-CM | POA: Diagnosis not present

## 2022-08-30 LAB — RAD ONC ARIA SESSION SUMMARY
Course Elapsed Days: 2
Plan Fractions Treated to Date: 2
Plan Prescribed Dose Per Fraction: 18 Gy
Plan Total Fractions Prescribed: 3
Plan Total Prescribed Dose: 54 Gy
Reference Point Dosage Given to Date: 36 Gy
Reference Point Session Dosage Given: 18 Gy
Session Number: 2

## 2022-09-02 ENCOUNTER — Other Ambulatory Visit: Payer: Self-pay

## 2022-09-02 ENCOUNTER — Ambulatory Visit
Admission: RE | Admit: 2022-09-02 | Discharge: 2022-09-02 | Disposition: A | Discharge status: Home / Self Care | Payer: Medicare HMO | Source: Ambulatory Visit | Attending: Radiation Oncology | Admitting: Radiation Oncology

## 2022-09-02 DIAGNOSIS — C3412 Malignant neoplasm of upper lobe, left bronchus or lung: Secondary | ICD-10-CM | POA: Insufficient documentation

## 2022-09-02 LAB — RAD ONC ARIA SESSION SUMMARY
Course Elapsed Days: 5
Plan Fractions Treated to Date: 3
Plan Prescribed Dose Per Fraction: 18 Gy
Plan Total Fractions Prescribed: 3
Plan Total Prescribed Dose: 54 Gy
Reference Point Dosage Given to Date: 54 Gy
Reference Point Session Dosage Given: 18 Gy
Session Number: 3

## 2022-09-04 ENCOUNTER — Ambulatory Visit: Payer: Medicare HMO | Admitting: Radiation Oncology

## 2022-09-05 NOTE — Radiation Completion Notes (Addendum)
  Radiation Oncology         [phone] ________________________________  Name: OSHAY DOCKEN MRN: [id number]  Date: 09/02/2022  DOB: 08-20-1950  End of Treatment Note  Patient Name: Holly May, Holly May MRN: [id number] Date of Birth: 04-Apr-1950 Referring Physician: Audie Box, M.D. Date of Service: 2022-09-05 Radiation Oncologist: Margaretmary Bayley, M.D. 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   Intent: Curative     ==========DELIVERED PLANS==========  First Treatment Date: 2022-08-28 - Last Treatment Date: 2022-09-02   Plan Name: Lung_L_SBRT Site: Lung, Left Technique: SBRT/SRT-IMRT Mode: Photon Dose Per Fraction: 18 Gy Prescribed Dose (Delivered / Prescribed): 54 Gy / 54 Gy Prescribed Fxs (Delivered / Prescribed): 3 / 3     ==========ON TREATMENT VISIT DATES========== 2022-08-28, 2022-08-30, 2022-08-30, 2022-09-02    See weekly On Treatment Notes in Epic for details.  She tolerated the treatments well without any acute ill effects and was discharged home in stable condition.  The patient will receive a call in about one month from the radiation oncology department and we will coordinate for a follow-up CT chest scan approximately 3 months from treatment completion. She will continue follow up with her pulmonologist, Dr. Tonia Brooms as well.  ------------------------------------------------   Margaretmary Dys, MD [facility]  Radiation Oncology Direct Dial: [phone]  Fax: [phone] [email or web address]  Skype  LinkedIn

## 2022-09-06 ENCOUNTER — Ambulatory Visit: Payer: Medicare HMO | Admitting: Radiation Oncology

## 2022-10-01 ENCOUNTER — Ambulatory Visit: Payer: Medicare HMO | Attending: Internal Medicine | Admitting: Internal Medicine

## 2022-10-01 ENCOUNTER — Other Ambulatory Visit: Payer: Self-pay | Admitting: Internal Medicine

## 2022-10-01 ENCOUNTER — Ambulatory Visit: Payer: Medicare HMO | Attending: Internal Medicine

## 2022-10-01 VITALS — BP 152/82 | HR 80 | Ht 67.0 in | Wt 134.8 lb

## 2022-10-01 DIAGNOSIS — I491 Atrial premature depolarization: Secondary | ICD-10-CM

## 2022-10-01 DIAGNOSIS — I4719 Other supraventricular tachycardia: Secondary | ICD-10-CM

## 2022-10-01 DIAGNOSIS — R9431 Abnormal electrocardiogram [ECG] [EKG]: Secondary | ICD-10-CM

## 2022-10-01 DIAGNOSIS — I4891 Unspecified atrial fibrillation: Secondary | ICD-10-CM | POA: Diagnosis not present

## 2022-10-01 DIAGNOSIS — J449 Chronic obstructive pulmonary disease, unspecified: Secondary | ICD-10-CM

## 2022-10-01 DIAGNOSIS — F17210 Nicotine dependence, cigarettes, uncomplicated: Secondary | ICD-10-CM | POA: Diagnosis not present

## 2022-10-01 DIAGNOSIS — C3412 Malignant neoplasm of upper lobe, left bronchus or lung: Secondary | ICD-10-CM | POA: Diagnosis not present

## 2022-10-01 DIAGNOSIS — R000 Tachycardia, unspecified: Secondary | ICD-10-CM | POA: Diagnosis not present

## 2022-10-01 NOTE — Progress Notes (Signed)
Cardiology Office Note  Date: 10/01/2022   ID: Holly, May 03-Oct-1950, MRN [id number]  PCP:  Holly Filbert, MD  Cardiologist:  Marjo Bicker, MD Electrophysiologist:  None   Reason for Office Visit: PAC vs Afib eval   History of Present Illness: Holly May is a 72 y.o. female known to have severe COPD, lung adenocarcinoma on radiation therapy, nicotine abuse, HTN was referred to cardiology clinic for evaluation of PAC versus atrial fibrillation.  Patient was admitted to [facility] in 07/2022 with COPD exacerbation.  EKG showed sinus tachycardia with frequent PACs. She was referred to cardiology clinic to rule out atrial fibrillation. She denies any palpitations, dizziness, fatigue and SOB. No angina or DOE.  Past Medical History:  Diagnosis Date   Arthritis    Cancer (HCC)    lung   COPD (chronic obstructive pulmonary disease) (HCC)    Depression    Dyspnea    Essential hypertension, benign    Type 2 diabetes mellitus (HCC)    Urinary incontinence     Past Surgical History:  Procedure Laterality Date   BRONCHIAL BIOPSY  07/09/2022   Procedure: BRONCHIAL BIOPSIES;  Surgeon: Josephine Igo, DO;  Location: [facility];  Service: Pulmonary;;   BRONCHIAL BRUSHINGS  07/09/2022   Procedure: BRONCHIAL BRUSHINGS;  Surgeon: Josephine Igo, DO;  Location: [facility];  Service: Pulmonary;;   BRONCHIAL NEEDLE ASPIRATION BIOPSY  07/09/2022   Procedure: BRONCHIAL NEEDLE ASPIRATION BIOPSIES;  Surgeon: Josephine Igo, DO;  Location: [facility];  Service: Pulmonary;;   CHOLECYSTECTOMY     FIDUCIAL MARKER PLACEMENT  07/09/2022   Procedure: FIDUCIAL MARKER PLACEMENT;  Surgeon: Josephine Igo, DO;  Location: [facility];  Service: Pulmonary;;   FRACTURE SURGERY Left    arm   OPEN REDUCTION INTERNAL FIXATION (ORIF) PROXIMAL PHALANX Left 04/11/2017   Procedure: OPEN REDUCTION INTERNAL FIXATION (ORIF) PROXIMAL PHALANX;  Surgeon: Dairl Ponder, MD;  Location: [facility];  Service: Orthopedics;  Laterality: Left;   RESECTION OF MEDIASTINAL MASS N/A 02/18/2019   Procedure: RESECTION OF MEDIASTINAL MASS;  Surgeon: Kerin Perna, MD;  Location: [facility] OR;  Service: Thoracic;  Laterality: N/A;   STERNOTOMY N/A 02/18/2019   Procedure: STERNOTOMY;  Surgeon: Kerin Perna, MD;  Location: [facility] OR;  Service: Thoracic;  Laterality: N/A;   VIDEO BRONCHOSCOPY WITH ENDOBRONCHIAL ULTRASOUND Bilateral 07/09/2022   Procedure: VIDEO BRONCHOSCOPY WITH ENDOBRONCHIAL ULTRASOUND;  Surgeon: Josephine Igo, DO;  Location: [facility];  Service: Pulmonary;  Laterality: Bilateral;    Current Outpatient Medications  Medication Sig Dispense Refill   albuterol (PROVENTIL HFA;VENTOLIN HFA) 108 (90 Base) MCG/ACT inhaler Inhale 2 puffs into the lungs every 4 (four) hours as needed for wheezing or shortness of breath (cough, shortness of breath or wheezing.). (Patient taking differently: Inhale 2 puffs into the lungs every 4 (four) hours as needed for wheezing or shortness of breath (cough).) 1 Inhaler 3   alendronate (FOSAMAX) 70 MG tablet Take 70 mg by mouth once a week. Wednesday     amLODipine (NORVASC) 5 MG tablet Take 5 mg by mouth daily.     citalopram (CELEXA) 20 MG tablet Take 1 tablet (20 mg total) by mouth daily. 30 tablet 0   gabapentin (NEURONTIN) 300 MG capsule Take 300 mg by mouth daily.     ipratropium-albuterol (DUONEB) 0.5-2.5 (3) MG/3ML SOLN Take 3 mLs by nebulization every 6 (six) hours as needed (wheezing, shortness of breath).     lisinopril (ZESTRIL)  40 MG tablet Take 40 mg by mouth daily.     lovastatin (MEVACOR) 40 MG tablet Take 40 mg by mouth daily.     omeprazole (PRILOSEC) 20 MG capsule Take 20 mg by mouth daily.     oxybutynin (DITROPAN) 5 MG tablet Take 5 mg by mouth 3 (three) times daily.      TRELEGY ELLIPTA 100-62.5-25 MCG/ACT AEPB Inhale 1 puff into the lungs daily.     No current facility-administered medications for this visit.   Allergies:   Patient has no known allergies.   Social History: The patient  reports that she has been smoking cigarettes. She started smoking about 51 years ago. She has a 75.00 pack-year smoking history. She has never used smokeless tobacco. She reports current alcohol use. She reports that she does not use drugs.   Family History: The patient's family history includes Diabetes Mellitus II in her mother and sister; Hypertension in her brother and sister.   ROS:  Please see the history of present illness. Otherwise, complete review of systems is positive for none.  All other systems are reviewed and negative.   Physical Exam: VS:  BP (!) 152/82 (BP Location: Left Arm, Patient Position: Sitting, Cuff Size: Normal)   Pulse 80   Ht 5\' 7"  (1.702 m)   Wt 134 lb 12.8 oz (61.1 kg)   SpO2 92%   BMI 21.11 kg/m , BMI Body mass index is 21.11 kg/m.  Wt Readings from Last 3 Encounters:  10/01/22 134 lb 12.8 oz (61.1 kg)  08/21/22 143 lb 6.4 oz (65 kg)  08/21/22 143 lb 3.2 oz (65 kg)    General: Patient appears comfortable at rest. HEENT: Conjunctiva and lids normal, oropharynx clear with moist mucosa. Neck: Supple, no elevated JVP or carotid bruits, no thyromegaly. Lungs: Clear to auscultation, nonlabored breathing at rest. Cardiac: Regular rate and rhythm, no S3 or significant systolic murmur, no pericardial rub. Abdomen: Soft, nontender, no hepatomegaly, bowel sounds present, no guarding or rebound. Extremities: No pitting edema, distal pulses 2+. Skin: Warm and dry. Musculoskeletal: No kyphosis. Neuropsychiatric: Alert and oriented x3, affect grossly appropriate.  Recent Labwork: 07/16/2022: TSH 0.784 07/17/2022: Magnesium 1.7 08/01/2022: ALT 11; AST 12; BUN 19; Creatinine 0.89; Hemoglobin 12.9; Platelet Count 217; Potassium 4.1; Sodium 140  No results found for: "CHOL", "TRIG", "HDL", "CHOLHDL", "VLDL", "LDLCALC", "LDLDIRECT"  Other Studies Reviewed Today:   Assessment and Plan:  Patient is a  72 year old F known to have severe COPD, lung adenocarcinoma on radiation therapy, nicotine abuse, HTN was referred to cardiology clinic for evaluation of PAC versus atrial fibrillation.  # Abnormal EKG -EKGs from 07/2022 hospitalization were reviewed. EKG showed NSR, sinus tachycardia versus multifocal atrial tachycardia.  No evidence of atrial fibrillation.  Will obtain 2-week event monitor to definitely rule out atrial fibrillation. Patient denies any palpitations, SOB or fatigue.  # Severe COPD, lung adenocarcinoma radiation therapy and nicotine abuse: Patient currently smokes 3 to 4 cigarettes/day and is trying very hard to quit.  She is turned down for lung surgery due to severe COPD and low lung reserve.   I have spent a total of 30 minutes with patient reviewing chart, EKGs, labs and examining patient as well as establishing an assessment and plan that was discussed with the patient.  > 50% of time was spent in direct patient care.    Medication Adjustments/Labs and Tests Ordered: Current medicines are reviewed at length with the patient today.  Concerns regarding medicines are outlined above.  Tests Ordered: No orders of the defined types were placed in this encounter.   Medication Changes: No orders of the defined types were placed in this encounter.   Disposition:  Follow up pending results  Signed, Monish Haliburton Verne Spurr, MD, 10/01/2022 3:23 PM    [facility] [address], [place], [place] [address]

## 2022-10-01 NOTE — Patient Instructions (Addendum)
Medication Instructions:  Your physician recommends that you continue on your current medications as directed. Please refer to the Current Medication list given to you today.   Labwork: None  Testing/Procedures: Your physician has recommended that you wear a Zio monitor.   This monitor is a medical device that records the heart's electrical activity. Doctors most often use these monitors to diagnose arrhythmias. Arrhythmias are problems with the speed or rhythm of the heartbeat. The monitor is a small device applied to your chest. You can wear one while you do your normal daily activities. While wearing this monitor if you have any symptoms to push the button and record what you felt. Once you have worn this monitor for the period of time provider prescribed (for 14 days), you will return the monitor device in the postage paid box. Once it is returned they will download the data collected and provide us with a report which the provider will then review and we will call you with those results. Important tips:  Avoid showering during the first 48 hours of wearing the monitor. Avoid excessive sweating to help maximize wear time. Do not submerge the device, no hot tubs, and no swimming pools. Keep any lotions or oils away from the patch. After 48 hours you may shower with the patch on. Take brief showers with your back facing the shower head.  Do not remove patch once it has been placed because that will interrupt data and decrease adhesive wear time. Push the button when you have any symptoms and write down what you were feeling. Once you have completed wearing your monitor, remove and place into box which has postage paid and place in your outgoing mailbox.  If for some reason you have misplaced your box then call our office and we can provide another box and/or mail it off for you.   Follow-Up: Your physician recommends that you schedule a follow-up appointment in: Pending Results  Any Other  Special Instructions Will Be Listed Below (If Applicable).  If you need a refill on your cardiac medications before your next appointment, please call your pharmacy.  

## 2022-10-11 NOTE — Addendum Note (Signed)
Encounter addended by: Marcello Fennel, PA-C on: 10/11/2022 4:34 PM  Actions taken: Clinical Note Signed

## 2022-10-15 ENCOUNTER — Ambulatory Visit
Admission: RE | Admit: 2022-10-15 | Discharge: 2022-10-15 | Disposition: A | Discharge status: Home / Self Care | Payer: Medicare HMO | Source: Ambulatory Visit | Attending: Radiation Oncology | Admitting: Radiation Oncology

## 2022-10-15 NOTE — Progress Notes (Signed)
  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 (as documented in provider EOT note)   The patient was available for call today.   Symptoms of fatigue have improved since completing therapy.  Symptoms of skin changes have improved since completing therapy.  Symptoms of esophagitis have improved since completing therapy.   The patient has scheduled follow up with her pulmonologist Dr. Tonia Brooms for ongoing care, and was encouraged to call if she develops concerns or questions regarding radiation.   This concludes the interaction.  Ruel Favors, LPN

## 2022-10-23 ENCOUNTER — Telehealth: Payer: Self-pay | Admitting: Internal Medicine

## 2022-10-23 NOTE — Telephone Encounter (Signed)
New Message:       Holly May is calling from [facility] with an abnormal results. She was disconnected.

## 2022-10-23 NOTE — Telephone Encounter (Signed)
Pt notified of appt date, time and location.

## 2022-10-23 NOTE — Telephone Encounter (Signed)
Spoke with [facility] they report that the pt had rapid afib on July 11 at adverage of 202 BPM. Will send to DOD.

## 2022-10-23 NOTE — Telephone Encounter (Signed)
Called pt to notify of the need to be seen. No answer. Left msg to call back.

## 2022-10-23 NOTE — Telephone Encounter (Signed)
Monitor data reviewed. Runs of SVT probably atach but also clear evidence of episodes of afib on July 4th at 1pm, needs PA appt to discuss rate control and anticoagulation   Dominga Ferry MD

## 2022-10-24 ENCOUNTER — Encounter: Payer: Self-pay | Admitting: Student

## 2022-10-24 ENCOUNTER — Ambulatory Visit: Payer: Medicare HMO | Attending: Student | Admitting: Student

## 2022-10-24 VITALS — BP 136/82 | HR 78 | Ht 67.0 in | Wt 133.0 lb

## 2022-10-24 DIAGNOSIS — C3412 Malignant neoplasm of upper lobe, left bronchus or lung: Secondary | ICD-10-CM | POA: Diagnosis not present

## 2022-10-24 DIAGNOSIS — I10 Essential (primary) hypertension: Secondary | ICD-10-CM | POA: Diagnosis not present

## 2022-10-24 DIAGNOSIS — J449 Chronic obstructive pulmonary disease, unspecified: Secondary | ICD-10-CM | POA: Diagnosis not present

## 2022-10-24 DIAGNOSIS — I480 Paroxysmal atrial fibrillation: Secondary | ICD-10-CM | POA: Diagnosis not present

## 2022-10-24 MED ORDER — APIXABAN 5 MG PO TABS: 5.0000 mg | ORAL_TABLET | Freq: Two times a day (BID) | ORAL | 11 refills | Status: DC

## 2022-10-24 MED ORDER — BISOPROLOL FUMARATE 5 MG PO TABS: 5.0000 mg | ORAL_TABLET | Freq: Every day | ORAL | 11 refills | Status: DC

## 2022-10-24 NOTE — Patient Instructions (Addendum)
Medication Instructions:  Your physician has recommended you make the following change in your medication:   Start taking Eliquis 5 mg Two Times Daily  Start Taking Bisoprolol 5 mg Daily   *If you need a refill on your cardiac medications before your next appointment, please call your pharmacy*   Lab Work: NONE   If you have labs (blood work) drawn today and your tests are completely normal, you will receive your results only by: MyChart Message (if you have MyChart) OR A paper copy in the mail If you have any lab test that is abnormal or we need to change your treatment, we will call you to review the results.   Testing/Procedures: NONE    Follow-Up: At [facility], you and your health needs are our priority.  As part of our continuing mission to provide you with exceptional heart care, we have created designated Provider Care Teams.  These Care Teams include your primary Cardiologist (physician) and Advanced Practice Providers (APPs -  Physician Assistants and Nurse Practitioners) who all work together to provide you with the care you need, when you need it.  We recommend signing up for the patient portal called "MyChart".  Sign up information is provided on this After Visit Summary.  MyChart is used to connect with patients for Virtual Visits (Telemedicine).  Patients are able to view lab/test results, encounter notes, upcoming appointments, etc.  Non-urgent messages can be sent to your provider as well.   To learn more about what you can do with MyChart, go to [email or web address].    Your next appointment:   3 month(s)  Provider:   You may see Vishnu P Mallipeddi, MD or one of the following Advanced Practice Providers on your designated Care Team:   Randall An, PA-C  Jacolyn Reedy, PA-C     Other Instructions Thank you for choosing [facility]!

## 2022-10-24 NOTE — Progress Notes (Signed)
Cardiology Office Note    Date:  10/24/2022  ID:  Holly, May 07/16/1950, MRN [id number] Cardiologist: Marjo Bicker, MD    History of Present Illness:    Holly May is a 72 y.o. 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  She was examined by Dr. Jenene May on 10/01/2022 as a new patient referral after her recent EKG had shown frequent PAC's and to rule out atrial fibrillation. She denied any recent chest pain or palpitations at that time. EKG's from her prior hospitalization were felt to be most consistent with sinus tachycardia vs. MAT with no definitive atrial fibrillation. A 2-week event monitor was recommended for further evaluation. Her monitor resulted yesterday showing episodes of atrial fibrillation and this was reviewed with Dr. Wyline May (DOD). Her monitor was felt to show runs of SVT and possible atrial tachycardia but she also had clear evidence of atrial fibrillation as well. A close follow-up appointment was therefore arranged to discuss rate-control and anticoagulation.   In talking with the patient today, she reports overall feeling well since her last office visit. Reports she tolerated radiation well with minimal side effects. In regards to when she wore her monitor, she denies any palpitations during that timeframe. No recent chest pain, dyspnea on exertion, orthopnea, PND or pitting edema. She is unaware of any personal history of cardiac arrhythmias or family history of atrial fibrillation.  Studies Reviewed:   EKG: EKG is not ordered today.  Echocardiogram: 07/2022 IMPRESSIONS     1. Left ventricular ejection fraction, by estimation, is 50 to 55%. The  left ventricle has low normal function. The left  ventricle demonstrates  global hypokinesis. Left ventricular diastolic parameters were normal.   2. Right ventricular systolic function is normal. The right ventricular  size is normal. There is normal pulmonary artery systolic pressure.   3. Left atrial size was mildly dilated.   4. The mitral valve is normal in structure. No evidence of mitral valve  regurgitation.   5. The aortic valve is grossly normal. Aortic valve regurgitation is not  visualized. Aortic valve sclerosis is present, with no evidence of aortic  valve stenosis.   6. The inferior vena cava is normal in size with <50% respiratory  variability, suggesting right atrial pressure of 8 mmHg.    Event Monitor: 09/2022   14 day monitor   Frequent supraventricular ectopy in the form of isolated PACs, occasional couplets and triplets. Episodes of SVT likely atrial tachycardia.   Occasional ventricular ectopy in the form of isolated PVCs. Rare couplets and triplets. Rare short runs wide complex tachycardia NSVT vs SVT with aberrancy   Episodes of atrial fibrillation. The estimated burden appears inaccurate as SVT looks to be misidentified as afib in some tracings. Afib clearly evident October 03, 2022 on multiple tracings 1pm, 111pm, and 238pm.   No symptoms reported     Patch Wear Time:  13 days and 23 hours (2024-07-02T14:29:23-0400 to 2024-07-16T13:30:45-0400)   Patient had a min HR of 55 bpm, max HR of 237 bpm, and avg HR of 90 bpm. Predominant underlying rhythm was Sinus Rhythm. 4 Ventricular Tachycardia runs occurred, the run with the fastest interval lasting 12 beats with a max rate of 218 bpm (avg 186 bpm);  the  run with the fastest interval was also the longest. Atrial Fibrillation occurred (14% burden), ranging from 81-237 bpm (avg of 142 bpm), the longest lasting 1 hour 39 mins with an avg rate of 108 bpm. Isolated SVEs were frequent (8.3%, 161096), SVE  Couplets were occasional (1.6%, 15173), and SVE Triplets were occasional  (1.1%, 6645). Isolated VEs were occasional (1.0%, 19027), VE Couplets were rare (<1.0%, 420), and VE Triplets were rare (<1.0%, 1). Ventricular Bigeminy and Trigeminy were present.  MD notification criteria for Rapid Atrial Fibrillation met - report posted prior to notification per account request [facility]).  Risk Assessment/Calculations:   CHA2DS2-VASc Score = 4   This indicates a 4.8% annual risk of stroke. The patient's score is based upon: CHF History: 0 HTN History: 1 Diabetes History: 0 Stroke History: 0 Vascular Disease History: 1 Age Score: 1 Gender Score: 1   Physical Exam:   VS:  BP 136/82   Pulse 78   Ht 5\' 7"  (1.702 m)   Wt 133 lb (60.3 kg)   SpO2 95%   BMI 20.83 kg/m    Wt Readings from Last 3 Encounters:  10/24/22 133 lb (60.3 kg)  10/01/22 134 lb 12.8 oz (61.1 kg)  08/21/22 143 lb 6.4 oz (65 kg)     GEN: Well nourished, well developed female appearing in no acute distress NECK: No JVD; No carotid bruits CARDIAC: RRR, no murmurs, rubs, gallops RESPIRATORY:  Clear to auscultation without rales, wheezing or rhonchi  ABDOMEN: Appears non-distended. No obvious abdominal masses. EXTREMITIES: No clubbing or cyanosis. No pitting edema.  Distal pedal pulses are 2+ bilaterally.   Assessment and Plan:   1. Paroxysmal Atrial Fibrillation/SVT/PAC's - This was a new diagnosis for the patient while she was wearing her monitor and she denies any associated palpitations, dizziness or presyncope at that time. Given her episodes of atrial fibrillation with RVR, will start Bisoprolol 5 mg daily to help with rate-control. Prefer this given her COPD. If she develops any worsening dyspnea, can switch to Cardizem but given her low-normal EF of 50 to 55%, would prefer beta-blocker therapy. - No history of bleeding issues. Recent CBC in 07/2022 showed hemoglobin was stable at 12.9 with platelets at 217 K. Discussed risks and benefits of anticoagulation with the patient and will start  Eliquis 5 mg twice daily which is the appropriate dose at this time given her age, weight and renal function.  2. HTN - BP is at 136/82 during today's visit. Continue Amlodipine 5 mg daily and Lisinopril 40 mg daily. Will start Bisoprolol 5 mg daily as discussed above.  3. COPD - Followed by [facility] (Dr. Sherene Sires). Remains on Trelegy Ellipta.   4. Adenocarcinoma of the left upper lobe - Previously evaluated by CT Surgery and not felt to be a surgical candidate. Recently completed radiation and she is scheduled for follow-up imaging in 12/2022.  Signed, Ellsworth Lennox, PA-C

## 2022-12-24 ENCOUNTER — Ambulatory Visit: Payer: Medicare HMO | Admitting: Internal Medicine

## 2022-12-24 NOTE — Progress Notes (Deleted)
Holly May, female    DOB: 1950-08-15    MRN: [id number]   Brief patient profile:  53  yobf  active smoker  referred to pulmonary clinic in [place]  04/05/2022 by Holly May  for SPN in setting  variable doe attributed to  Copd / AB  x  2017    S/p thymomectomy by Holly May 2020 s adjuctive rx with preop pfts c/w GOLD 3 copd    History of Present Illness  04/05/2022  Pulmonary/ 1st office eval/ Holly May / [facility] on acei and DPI  Chief Complaint  Patient presents with   Consult    SOB   Dyspnea:  mb and back 2104ft  each way level and so short of breath  sometimes has to stop but varies s pattern  Cough: clear mucus variably producttive esp p lie down sometimes wakes her up p asleep/ flat 2 pillows  SABA use: 3 x weekly  02: none Rec We will walk you today to see if you qualify for 0xygen  Start Trelegy 100 one click each For cough / congestion > mucinex 1200 mg every 12 hours as needed  Levaquin 500 mg one daily x 10 days  CT chest needs to be done after Feb 3rd 2024  The key is to stop smoking completely before smoking completely stops you! When you start you new month of medications need to substitute olmesartan 40 mg for the lisinopril 40 mg daily   Please schedule a follow up office visit in 4-5  weeks, sooner if needed  with all medications /inhalers/ solutions in hand so we can verify exactly what you are taking. This includes all medications from all doctors and over the counters      05/16/2022  f/u ov/[place] office/Holly May re: GOLD 3/ still smoking  maint on Trelegy 100 one each am  / did not bring meds  Chief Complaint  Patient presents with   Follow-up    Doing well since last ov   Dyspnea:  easier to push buggy at [employer] vs prior to trelegy rx  Cough: variable slt grey esp am / no blood  Sleeping: none  SABA use: did not bring as rec  02: none  Covid status: says all  Rec I will call the results of the PET scan and we'll go from  there clinical stage Ia adenocarcinoma of the left upper lobe. > SBRT  /turned down for resection by Holly May    08/21/2022  f/u ov/[place] office/Holly May re: GOLD 3 copd  maint on trelegy  Chief Complaint  Patient presents with   Follow-up    Pt f/u states that shse is doing well she begins her cancer treatment next week, she does believe that her breathing is getting better  Dyspnea:  [employer] walking ok Cough: minimal / thinks she's no long on acei  Sleeping: flat bed 3 pillows baseline  SABA use: 3 x weekly / neb rarely  02: none  Rec Call us back with the spelling of the new blood pressure pill  Plan A = Automatic = Always=   Trelegy 100 one puff each am   Plan B = Backup (to supplement plan A, not to replace it) Only use your albuterol inhaler as a rescue  Plan C = Crisis (instead of Plan B but only if Plan B stops working) - only use your albuterol nebulizer if you first try Plan  Please schedule a follow up visit in 3 months but  call sooner if needed  with all medications /inhalers/ solutions in hand   12/24/2022  f/u ov/[place] office/Holly May re: *** maint on *** did *** bring meds  No chief complaint on file.   Dyspnea:  *** Cough: *** Sleeping: ***   resp cc  SABA use: *** 02: ***  Lung cancer screening: ***   No obvious day to day or daytime variability or assoc excess/ purulent sputum or mucus plugs or hemoptysis or cp or chest tightness, subjective wheeze or overt sinus or hb symptoms.    Also denies any obvious fluctuation of symptoms with weather or environmental changes or other aggravating or alleviating factors except as outlined above   No unusual exposure hx or h/o childhood pna/ asthma or knowledge of premature birth.  Current Allergies, Complete Past Medical History, Past Surgical History, Family History, and Social History were reviewed in [employer] record.  ROS  The following are not active complaints unless  bolded Hoarseness, sore throat, dysphagia, dental problems, itching, sneezing,  nasal congestion or discharge of excess mucus or purulent secretions, ear ache,   fever, chills, sweats, unintended wt loss or wt gain, classically pleuritic or exertional cp,  orthopnea pnd or arm/hand swelling  or leg swelling, presyncope, palpitations, abdominal pain, anorexia, nausea, vomiting, diarrhea  or change in bowel habits or change in bladder habits, change in stools or change in urine, dysuria, hematuria,  rash, arthralgias, visual complaints, headache, numbness, weakness or ataxia or problems with walking or coordination,  change in mood or  memory.        No outpatient medications have been marked as taking for the 12/24/22 encounter (Appointment) with Nyoka Cowden, MD.                       Past Medical History:  Diagnosis Date   Arthritis    COPD (chronic obstructive pulmonary disease) (HCC)    Depression    Dyspnea    Essential hypertension, benign    Type 2 diabetes mellitus (HCC)    Urinary incontinence        Objective:    Wts  12/24/2022         ***  08/21/2022        143  05/16/22 135 lb 12.8 oz (61.6 kg)  05/14/22 134 lb (60.8 kg)  04/05/22 134 lb 6.4 oz (61 kg)    Vital signs reviewed  12/24/2022  - Note at rest 02 sats  ***% on ***   General appearance:    *** edentulous      Mild barrel   ***      Assessment

## 2022-12-29 NOTE — Progress Notes (Unsigned)
Holly May, female    DOB: Aug 21, 1950    MRN: [id number]   Brief patient profile:  27  yobf  active smoker  referred to pulmonary clinic in [place]  04/05/2022 by Holly May  for SPN in setting  variable doe attributed to  Copd / AB  x  2017    S/p thymomectomy by Holly May 2020 s adjuctive rx with preop pfts c/w GOLD 3 copd    History of Present Illness  04/05/2022  Pulmonary/ 1st office eval/ Holly May / Holly May Office on acei and DPI  Chief Complaint  Patient presents with   Consult    SOB   Dyspnea:  mb and back 249ft  each way level and so short of breath  sometimes has to stop but varies s pattern  Cough: clear mucus variably producttive esp p lie down sometimes wakes her up p asleep/ flat 2 pillows  SABA use: 3 x weekly  02: none Rec We will walk you today to see if you qualify for 0xygen  Start Trelegy 100 one click each For cough / congestion > mucinex 1200 mg every 12 hours as needed  Levaquin 500 mg one daily x 10 days  CT chest needs to be done after Feb 3rd 2024  The key is to stop smoking completely before smoking completely stops you! When you start you new month of medications need to substitute olmesartan 40 mg for the lisinopril 40 mg daily   Please schedule a follow up office visit in 4-5  weeks, sooner if needed  with all medications /inhalers/ solutions in hand so we can verify exactly what you are taking. This includes all medications from all doctors and over the counters      05/16/2022  f/u ov/[place] office/Holly May re: GOLD 3/ still smoking  maint on Trelegy 100 one each am  / did not bring meds  Chief Complaint  Patient presents with   Follow-up    Doing well since last ov   Dyspnea:  easier to push buggy at [employer] vs prior to trelegy rx  Cough: variable slt grey esp am / no blood  Sleeping: none  SABA use: did not bring as rec  02: none  Covid status: says all  Rec I will call the results of the PET scan and we'll go from  there clinical stage Ia adenocarcinoma of the left upper lobe. > SBRT  /turned down for resection by Holly May    08/21/2022  f/u ov/[facility] May office/Holly May re: GOLD 3 copd  maint on trelegy  Chief Complaint  Patient presents with   Follow-up    Pt f/u states that shse is doing well she begins her cancer treatment next week, she does believe that her breathing is getting better  Dyspnea:  [employer] walking ok Cough: minimal / thinks she's no long on acei  Sleeping: flat bed 3 pillows baseline  SABA use: 3 x weekly / neb rarely  02: none  Rec Call us back with the spelling of the new blood pressure pill  Plan A = Automatic = Always=   Trelegy 100 one puff each am   Plan B = Backup (to supplement plan A, not to replace it) Only use your albuterol inhaler as a rescue  Plan C = Crisis (instead of Plan B but only if Plan B stops working) - only use your albuterol nebulizer if you first try Plan  Please schedule a follow up visit in 3 months but  call sooner if needed  with all medications /inhalers/ solutions in hand    12/31/2022  f/u ov/[facility] May office/Holly May re: *** maint on *** did *** bring meds  No chief complaint on file.   Dyspnea:  *** Cough: *** Sleeping: ***   resp cc  SABA use: *** 02: ***  Lung cancer screening: ***   No obvious day to day or daytime variability or assoc excess/ purulent sputum or mucus plugs or hemoptysis or cp or chest tightness, subjective wheeze or overt sinus or hb symptoms.    Also denies any obvious fluctuation of symptoms with weather or environmental changes or other aggravating or alleviating factors except as outlined above   No unusual exposure hx or h/o childhood pna/ asthma or knowledge of premature birth.  Current Allergies, Complete Past Medical History, Past Surgical History, Family History, and Social History were reviewed in [employer] record.  ROS  The following are not active complaints unless  bolded Hoarseness, sore throat, dysphagia, dental problems, itching, sneezing,  nasal congestion or discharge of excess mucus or purulent secretions, ear ache,   fever, chills, sweats, unintended wt loss or wt gain, classically pleuritic or exertional cp,  orthopnea pnd or arm/hand swelling  or leg swelling, presyncope, palpitations, abdominal pain, anorexia, nausea, vomiting, diarrhea  or change in bowel habits or change in bladder habits, change in stools or change in urine, dysuria, hematuria,  rash, arthralgias, visual complaints, headache, numbness, weakness or ataxia or problems with walking or coordination,  change in mood or  memory.        No outpatient medications have been marked as taking for the 12/31/22 encounter (Appointment) with Holly Cowden, MD.                       Past Medical History:  Diagnosis Date   Arthritis    COPD (chronic obstructive pulmonary disease) (HCC)    Depression    Dyspnea    Essential hypertension, benign    Type 2 diabetes mellitus (HCC)    Urinary incontinence        Objective:    Wts  12/31/2022         ***  08/21/2022        143  05/16/22 135 lb 12.8 oz (61.6 kg)  05/14/22 134 lb (60.8 kg)  04/05/22 134 lb 6.4 oz (61 kg)    Vital signs reviewed  12/31/2022  - Note at rest 02 sats  ***% on ***   General appearance:    *** edentulous      Mild barrel   ***      Assessment

## 2022-12-30 ENCOUNTER — Ambulatory Visit (HOSPITAL_COMMUNITY)
Admission: RE | Admit: 2022-12-30 | Discharge: 2022-12-30 | Disposition: A | Discharge status: Home / Self Care | Payer: Medicare HMO | Source: Ambulatory Visit | Attending: Hematology | Admitting: Hematology

## 2022-12-30 ENCOUNTER — Inpatient Hospital Stay: Payer: Medicare HMO | Attending: Hematology

## 2022-12-30 DIAGNOSIS — J439 Emphysema, unspecified: Secondary | ICD-10-CM | POA: Insufficient documentation

## 2022-12-30 DIAGNOSIS — I700 Atherosclerosis of aorta: Secondary | ICD-10-CM | POA: Diagnosis not present

## 2022-12-30 DIAGNOSIS — Z79899 Other long term (current) drug therapy: Secondary | ICD-10-CM | POA: Insufficient documentation

## 2022-12-30 DIAGNOSIS — C3412 Malignant neoplasm of upper lobe, left bronchus or lung: Secondary | ICD-10-CM | POA: Insufficient documentation

## 2022-12-30 LAB — COMPREHENSIVE METABOLIC PANEL
ALT: 11 U/L (ref 0–44)
AST: 16 U/L (ref 15–41)
Albumin: 3.1 g/dL — ABNORMAL LOW (ref 3.5–5.0)
Alkaline Phosphatase: 82 U/L (ref 38–126)
Anion gap: 6 (ref 5–15)
BUN: 14 mg/dL (ref 8–23)
CO2: 32 mmol/L (ref 22–32)
Calcium: 8.5 mg/dL — ABNORMAL LOW (ref 8.9–10.3)
Chloride: 104 mmol/L (ref 98–111)
Creatinine, Ser: 0.92 mg/dL (ref 0.44–1.00)
GFR, Estimated: >60 mL/min (ref >60)
Glucose, Bld: 213 mg/dL — ABNORMAL HIGH (ref 70–99)
Potassium: 3.4 mmol/L — ABNORMAL LOW (ref 3.5–5.1)
Sodium: 142 mmol/L (ref 135–145)
Total Bilirubin: 0.3 mg/dL (ref 0.3–1.2)
Total Protein: 6.3 g/dL — ABNORMAL LOW (ref 6.5–8.1)

## 2022-12-30 LAB — CBC WITH DIFFERENTIAL/PLATELET
Abs Immature Granulocytes: 0.02 10*3/uL (ref 0.00–0.07)
Basophils Absolute: 0.1 10*3/uL (ref 0.0–0.1)
Basophils Relative: 1 %
Eosinophils Absolute: 0.6 10*3/uL — ABNORMAL HIGH (ref 0.0–0.5)
Eosinophils Relative: 7 %
HCT: 46.5 % — ABNORMAL HIGH (ref 36.0–46.0)
Hemoglobin: 15.3 g/dL — ABNORMAL HIGH (ref 12.0–15.0)
Immature Granulocytes: 0 %
Lymphocytes Relative: 44 %
Lymphs Abs: 3.9 10*3/uL (ref 0.7–4.0)
MCH: 31.7 pg (ref 26.0–34.0)
MCHC: 32.9 g/dL (ref 30.0–36.0)
MCV: 96.5 fL (ref 80.0–100.0)
Monocytes Absolute: 0.7 10*3/uL (ref 0.1–1.0)
Monocytes Relative: 7 %
Neutro Abs: 3.8 10*3/uL (ref 1.7–7.7)
Neutrophils Relative %: 41 %
Platelets: 260 10*3/uL (ref 150–400)
RBC: 4.82 MIL/uL (ref 3.87–5.11)
RDW: 15.8 % — ABNORMAL HIGH (ref 11.5–15.5)
WBC: 9.1 10*3/uL (ref 4.0–10.5)
nRBC: 0 % (ref 0.0–0.2)

## 2022-12-30 MED ORDER — IOHEXOL 300 MG/ML  SOLN
100.0000 mL | Freq: Once | INTRAMUSCULAR | Status: AC | PRN
  Administered 2022-12-30: 100 mL via INTRAVENOUS

## 2022-12-31 ENCOUNTER — Ambulatory Visit (INDEPENDENT_AMBULATORY_CARE_PROVIDER_SITE_OTHER): Payer: Medicare HMO | Admitting: Internal Medicine

## 2022-12-31 ENCOUNTER — Encounter: Payer: Self-pay | Admitting: Internal Medicine

## 2022-12-31 VITALS — BP 176/86 | HR 60 | Ht 67.0 in | Wt 136.0 lb

## 2022-12-31 DIAGNOSIS — J449 Chronic obstructive pulmonary disease, unspecified: Secondary | ICD-10-CM | POA: Diagnosis not present

## 2022-12-31 DIAGNOSIS — F17210 Nicotine dependence, cigarettes, uncomplicated: Secondary | ICD-10-CM | POA: Diagnosis not present

## 2022-12-31 NOTE — Patient Instructions (Signed)
The key is to stop smoking completely before smoking completely stops you!  Please schedule a follow up office visit in 6 weeks, call sooner if needed with all respiratory  medications /inhalers/ solutions in hand so we can verify exactly what you are taking. This includes all medications from all doctors and over the counters

## 2023-01-01 ENCOUNTER — Encounter: Payer: Self-pay | Admitting: Internal Medicine

## 2023-01-01 NOTE — Assessment & Plan Note (Signed)
Counseled re importance of smoking cessation but did not meet time criteria for separate billing         F/u in 6 weeks/ call sooner if needed     Total time for H and P, chart review, counseling,  and generating customized AVS unique to this office visit / same day charting = 25 min

## 2023-01-01 NOTE — Assessment & Plan Note (Signed)
Active smoker - PFT's  02/16/2019  FEV1 0.87 (39 % ) ratio 0.39  p 7 % improvement from saba p ? prior to study with DLCO  10.88 (49%)   and FV curve classically concave  - 04/05/2022  After extensive coaching inhaler device,  effectiveness =    75% with dpi > trelegy trial  - d/c ACE 04/05/2022  - 04/05/2022   Walked on RA  x  3  lap(s) =  approx 450  ft  @ mod pace, stopped due to end of study with lowest 02 sats 95% no sob   - PFT's  06/03/22  FEV1 0.95 (38 % ) ratio 0.54  p 0 % improvement from saba p 0 prior to study with DLCO  10.31 (49%)   and FV curve mildly concave/somewhat atyical and ERV 6% at wt 144    For now, Adequate control on present rx (whatever that may be) ,  > no change in rx needed  but must return with all meds in hand using a trust but verify approach to confirm accurate Medication  Reconciliation The principal here is that until we are certain that the  patients are doing what we've asked, it makes no sense to ask them to do more.

## 2023-01-06 ENCOUNTER — Inpatient Hospital Stay: Payer: Medicare HMO | Attending: Hematology | Admitting: Hematology

## 2023-01-06 VITALS — BP 171/85 | HR 81 | Temp 98.0°F | Resp 20 | Wt 137.3 lb

## 2023-01-06 DIAGNOSIS — F17210 Nicotine dependence, cigarettes, uncomplicated: Secondary | ICD-10-CM | POA: Insufficient documentation

## 2023-01-06 DIAGNOSIS — Z7901 Long term (current) use of anticoagulants: Secondary | ICD-10-CM | POA: Insufficient documentation

## 2023-01-06 DIAGNOSIS — I10 Essential (primary) hypertension: Secondary | ICD-10-CM | POA: Insufficient documentation

## 2023-01-06 DIAGNOSIS — F32A Depression, unspecified: Secondary | ICD-10-CM | POA: Diagnosis not present

## 2023-01-06 DIAGNOSIS — Z8249 Family history of ischemic heart disease and other diseases of the circulatory system: Secondary | ICD-10-CM | POA: Insufficient documentation

## 2023-01-06 DIAGNOSIS — Z9049 Acquired absence of other specified parts of digestive tract: Secondary | ICD-10-CM | POA: Diagnosis not present

## 2023-01-06 DIAGNOSIS — C3412 Malignant neoplasm of upper lobe, left bronchus or lung: Secondary | ICD-10-CM | POA: Insufficient documentation

## 2023-01-06 DIAGNOSIS — M4854XA Collapsed vertebra, not elsewhere classified, thoracic region, initial encounter for fracture: Secondary | ICD-10-CM | POA: Diagnosis not present

## 2023-01-06 DIAGNOSIS — Z923 Personal history of irradiation: Secondary | ICD-10-CM | POA: Insufficient documentation

## 2023-01-06 DIAGNOSIS — I700 Atherosclerosis of aorta: Secondary | ICD-10-CM | POA: Insufficient documentation

## 2023-01-06 DIAGNOSIS — M5146 Schmorl's nodes, lumbar region: Secondary | ICD-10-CM | POA: Diagnosis not present

## 2023-01-06 DIAGNOSIS — Z833 Family history of diabetes mellitus: Secondary | ICD-10-CM | POA: Diagnosis not present

## 2023-01-06 DIAGNOSIS — R911 Solitary pulmonary nodule: Secondary | ICD-10-CM

## 2023-01-06 DIAGNOSIS — I513 Intracardiac thrombosis, not elsewhere classified: Secondary | ICD-10-CM | POA: Diagnosis not present

## 2023-01-06 DIAGNOSIS — J432 Centrilobular emphysema: Secondary | ICD-10-CM | POA: Insufficient documentation

## 2023-01-06 DIAGNOSIS — Z79899 Other long term (current) drug therapy: Secondary | ICD-10-CM | POA: Insufficient documentation

## 2023-01-06 NOTE — Progress Notes (Signed)
[facility] [address], [place] [address]   Clinic Day:  01/06/2023  Referring physician: Alvina Filbert, MD  Patient Care Team: Alvina Filbert, MD as PCP - General (Internal Medicine) Mallipeddi, Orion Modest, MD as PCP - Cardiology (Cardiology) Doreatha Massed, MD as Medical Oncologist (Medical Oncology) Therese Sarah, RN as Oncology Nurse Navigator (Medical Oncology)   ASSESSMENT & PLAN:   Assessment:  1.  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  Indeterminate 10 x 6 mm right lower lobe nodule. - PET scan (05/16/2022): Hypermetabolic activity in the 11 mm LUL nodule.  No other adenopathy. - Bronchoscopy by Dr. Tonia Brooms (07/09/2022) - Pathology of FNA: Adenocarcinoma.  FNA of 11 L lymph node negative. - Patient seen by Dr. Dorris Fetch and was not felt to be a surgical candidate. - Seen by Dr. Kathrynn Running and plan for 3-5 courses of SBRT.  Also evaluated by Dr. Arbutus Ped. - XRT 3 fractions x 18 Gray= 54 Gray from 08/28/2022 through 09/02/2022  2.  Social/family history: - She lives with her son.  Able to do ADLs but slowly.  Able to do some IADLs.  Worked in the [employer] for 30 years prior to retirement.  No asbestos exposure.  She smokes 1 pack/day for past 50 years.  She is trying to cut back and is smoking less than 5 cigarettes a day and using nicotine gum at this time. - Niece had lung cancer.  Plan:  1.  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 - Denies any chest pains or hemoptysis. - Reviewed CT chest (12/30/2022): Stable spiculated left upper lobe nodule with adjacent fiducial marker.  No evidence of local recurrence or metastatic disease.  New superior endplate compression deformity at L2 without definite pathologic features.  Additional thoracic  compression deformities are grossly stable. - Recommend follow-up in 6 months with repeat CT of the chest. - Due to compression deformities, I have also recommended doing a bone density test prior to next visit.  No orders of the defined types were placed in this encounter.     I,Katie Daubenspeck,acting as a Neurosurgeon for Doreatha Massed, MD.,have documented all relevant documentation on the behalf of Doreatha Massed, MD,as directed by  Doreatha Massed, MD while in the presence of Doreatha Massed, MD.   I, Doreatha Massed MD, have reviewed the above documentation for accuracy and completeness, and I agree with the above.   Doreatha Massed, MD   10/7/20243:32 PM  CHIEF COMPLAINT/PURPOSE OF CONSULT:   Diagnosis: stage IA NSCLC, adenocarcinoma of LUL   Cancer Staging  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) Staging form: Lung, AJCC 8th Edition - Clinical stage from 08/20/2022: cT1, cN0, cM0 - Unsigned    Prior Therapy: none  Current Therapy:  pending SBRT   HISTORY OF PRESENT ILLNESS:   Oncology History   No history exists.      Holly May is a 72 y.o. female seen for follow-up of left upper lobe adenocarcinoma.  Reports appetite of 50%.  Energy levels are 40%.  Dyspnea on exertion is stable.  She has completed radiation therapy on 09/02/2022.  PAST MEDICAL HISTORY:   Past Medical History: Past Medical History:  Diagnosis Date   Arthritis    Cancer (HCC)    lung  COPD (chronic obstructive pulmonary disease) (HCC)    Depression    Dyspnea    Essential hypertension, benign    Type 2 diabetes mellitus (HCC)    Urinary incontinence     Surgical History: Past Surgical History:  Procedure Laterality Date   BRONCHIAL BIOPSY  07/09/2022   Procedure: BRONCHIAL BIOPSIES;  Surgeon: Josephine Igo, DO;  Location: [facility];  Service: Pulmonary;;   BRONCHIAL BRUSHINGS  07/09/2022   Procedure: BRONCHIAL BRUSHINGS;  Surgeon: Josephine Igo, DO;   Location: [facility];  Service: Pulmonary;;   BRONCHIAL NEEDLE ASPIRATION BIOPSY  07/09/2022   Procedure: BRONCHIAL NEEDLE ASPIRATION BIOPSIES;  Surgeon: Josephine Igo, DO;  Location: [facility];  Service: Pulmonary;;   CHOLECYSTECTOMY     FIDUCIAL MARKER PLACEMENT  07/09/2022   Procedure: FIDUCIAL MARKER PLACEMENT;  Surgeon: Josephine Igo, DO;  Location: [facility];  Service: Pulmonary;;   FRACTURE SURGERY Left    arm   OPEN REDUCTION INTERNAL FIXATION (ORIF) PROXIMAL PHALANX Left 04/11/2017   Procedure: OPEN REDUCTION INTERNAL FIXATION (ORIF) PROXIMAL PHALANX;  Surgeon: Dairl Ponder, MD;  Location: [facility];  Service: Orthopedics;  Laterality: Left;   RESECTION OF MEDIASTINAL MASS N/A 02/18/2019   Procedure: RESECTION OF MEDIASTINAL MASS;  Surgeon: Kerin Perna, MD;  Location[facility];  Service: Thoracic;  Laterality: N/A;   STERNOTOMY N/A 02/18/2019   Procedure: STERNOTOMY;  Surgeon: Kerin Perna, MD;  Location: [facility] OR;  Service: Thoracic;  Laterality: N/A;   VIDEO BRONCHOSCOPY WITH ENDOBRONCHIAL ULTRASOUND Bilateral 07/09/2022   Procedure: VIDEO BRONCHOSCOPY WITH ENDOBRONCHIAL ULTRASOUND;  Surgeon: Josephine Igo, DO;  Location: [facility];  Service: Pulmonary;  Laterality: Bilateral;    Social History: Social History   Socioeconomic History   Marital status: Widowed    Spouse name: Not on file   Number of children: Not on file   Years of education: Not on file   Highest education level: Not on file  Occupational History   Occupation: retired  Tobacco Use   Smoking status: Every Day    Current packs/day: 1.50    Average packs/day: 1.5 packs/day for 52.0 years (78.0 ttl pk-yrs)    Types: Cigarettes    Start date: 01/09/1971   Smokeless tobacco: Never   Tobacco comments:    3 cigarettes smoked daily [facility]J, 07/26/22  Vaping Use   Vaping status: Never Used  Substance and Sexual Activity   Alcohol use: Yes    Comment: occ   Drug use: No    Sexual activity: Not on file  Other Topics Concern   Not on file  Social History Narrative   Not on file   Social Determinants of Health   Financial Resource Strain: Not on file  Food Insecurity: Food Insecurity Present (08/21/2022)   Hunger Vital Sign    Worried About Running Out of Food in the Last Year: Sometimes true    Ran Out of Food in the Last Year: Sometimes true  Transportation Needs: No Transportation Needs (08/21/2022)   PRAPARE - Administrator, Civil Service (Medical): No    Lack of Transportation (Non-Medical): No  Physical Activity: Not on file  Stress: Not on file  Social Connections: Not on file  Intimate Partner Violence: Not At Risk (08/21/2022)   Humiliation, Afraid, Rape, and Kick questionnaire    Fear of Current or Ex-Partner: No    Emotionally Abused: No    Physically Abused: No    Sexually Abused: No  Family History: Family History  Problem Relation Age of Onset   Diabetes Mellitus II Sister    Diabetes Mellitus II Mother    Hypertension Brother    Hypertension Sister     Current Medications:  Current Outpatient Medications:    albuterol (PROVENTIL HFA;VENTOLIN HFA) 108 (90 Base) MCG/ACT inhaler, Inhale 2 puffs into the lungs every 4 (four) hours as needed for wheezing or shortness of breath (cough, shortness of breath or wheezing.). (Patient taking differently: Inhale 2 puffs into the lungs every 4 (four) hours as needed for wheezing or shortness of breath (cough).), Disp: 1 Inhaler, Rfl: 3   alendronate (FOSAMAX) 70 MG tablet, Take 70 mg by mouth once a week. Wednesday, Disp: , Rfl:    amLODipine (NORVASC) 5 MG tablet, Take 5 mg by mouth daily., Disp: , Rfl:    apixaban (ELIQUIS) 5 MG TABS tablet, Take 1 tablet (5 mg total) by mouth 2 (two) times daily., Disp: 60 tablet, Rfl: 11   bisoprolol (ZEBETA) 5 MG tablet, Take 1 tablet (5 mg total) by mouth daily., Disp: 30 tablet, Rfl: 11   citalopram (CELEXA) 20 MG tablet, Take 1 tablet (20  mg total) by mouth daily., Disp: 30 tablet, Rfl: 0   fluticasone furoate-vilanterol (BREO ELLIPTA) 100-25 MCG/ACT AEPB, Inhale 1 puff into the lungs daily., Disp: , Rfl:    gabapentin (NEURONTIN) 300 MG capsule, Take 300 mg by mouth daily., Disp: , Rfl:    ipratropium-albuterol (DUONEB) 0.5-2.5 (3) MG/3ML SOLN, Take 3 mLs by nebulization every 6 (six) hours as needed (wheezing, shortness of breath)., Disp: , Rfl:    lisinopril (ZESTRIL) 40 MG tablet, Take 40 mg by mouth daily., Disp: , Rfl:    lovastatin (MEVACOR) 40 MG tablet, Take 40 mg by mouth daily., Disp: , Rfl:    omeprazole (PRILOSEC) 20 MG capsule, Take 20 mg by mouth daily., Disp: , Rfl:    oxybutynin (DITROPAN) 5 MG tablet, Take 5 mg by mouth 3 (three) times daily. , Disp: , Rfl:    TRELEGY ELLIPTA 100-62.5-25 MCG/ACT AEPB, Inhale 1 puff into the lungs daily., Disp: , Rfl:    mirtazapine (REMERON) 15 MG tablet, Take 15 mg by mouth at bedtime., Disp: , Rfl:    Allergies: No Known Allergies  REVIEW OF SYSTEMS:   Review of Systems  Constitutional:  Negative for chills, fatigue and fever.  HENT:   Negative for lump/mass, mouth sores, nosebleeds, sore throat and trouble swallowing.   Eyes:  Negative for eye problems.  Respiratory:  Positive for shortness of breath. Negative for cough.   Cardiovascular:  Negative for chest pain, leg swelling and palpitations.  Gastrointestinal:  Positive for diarrhea and nausea. Negative for abdominal pain and vomiting.  Genitourinary:  Negative for bladder incontinence, difficulty urinating, dysuria, frequency, hematuria and nocturia.   Musculoskeletal:  Negative for arthralgias, back pain, flank pain, myalgias and neck pain.  Skin:  Negative for itching and rash.  Neurological:  Positive for headaches and numbness. Negative for dizziness.  Hematological:  Does not bruise/bleed easily.  Psychiatric/Behavioral:  Positive for sleep disturbance. Negative for depression and suicidal ideas. The patient  is not nervous/anxious.   All other systems reviewed and are negative.    VITALS:   Blood pressure (!) 171/85, pulse 81, temperature 98 F (36.7 C), temperature source Oral, resp. rate 20, weight 137 lb 4.8 oz (62.3 kg), SpO2 95%.  Wt Readings from Last 3 Encounters:  01/06/23 137 lb 4.8 oz (62.3 kg)  12/31/22 136  lb (61.7 kg)  10/24/22 133 lb (60.3 kg)    Body mass index is 21.5 kg/m.  Performance status (ECOG): 1 - Symptomatic but completely ambulatory  PHYSICAL EXAM:   Physical Exam Vitals and nursing note reviewed. Exam conducted with a chaperone present.  Constitutional:      Appearance: Normal appearance.  Cardiovascular:     Rate and Rhythm: Normal rate and regular rhythm.     Pulses: Normal pulses.     Heart sounds: Normal heart sounds.  Pulmonary:     Effort: Pulmonary effort is normal.     Breath sounds: Normal breath sounds.  Abdominal:     Palpations: Abdomen is soft. There is no hepatomegaly, splenomegaly or mass.     Tenderness: There is no abdominal tenderness.  Musculoskeletal:     Right lower leg: No edema.     Left lower leg: No edema.  Lymphadenopathy:     Cervical: No cervical adenopathy.     Right cervical: No superficial, deep or posterior cervical adenopathy.    Left cervical: No superficial, deep or posterior cervical adenopathy.     Upper Body:     Right upper body: No supraclavicular or axillary adenopathy.     Left upper body: No supraclavicular or axillary adenopathy.  Neurological:     General: No focal deficit present.     Mental Status: She is alert and oriented to person, place, and time.  Psychiatric:        Mood and Affect: Mood normal.        Behavior: Behavior normal.     LABS:      Latest Ref Rng & Units 12/30/2022    1:56 PM 08/01/2022    7:38 AM 07/17/2022    4:55 AM  CBC  WBC 4.0 - 10.5 K/uL 9.1  10.8  13.8   Hemoglobin 12.0 - 15.0 g/dL 40.9  81.1  91.4   Hematocrit 36.0 - 46.0 % 46.5  39.4  36.7   Platelets 150 - 400  K/uL 260  217  155       Latest Ref Rng & Units 12/30/2022    1:56 PM 08/01/2022    7:38 AM 07/17/2022    4:55 AM  CMP  Glucose 70 - 99 mg/dL 782  956  213   BUN 8 - 23 mg/dL 14  19  21    Creatinine 0.44 - 1.00 mg/dL 0.86  5.78  4.69   Sodium 135 - 145 mmol/L 142  140  135   Potassium 3.5 - 5.1 mmol/L 3.4  4.1  4.4   Chloride 98 - 111 mmol/L 104  105  104   CO2 22 - 32 mmol/L 32  29  21   Calcium 8.9 - 10.3 mg/dL 8.5  9.1  8.2   Total Protein 6.5 - 8.1 g/dL 6.3  6.6  5.5   Total Bilirubin 0.3 - 1.2 mg/dL 0.3  0.5  0.9   Alkaline Phos 38 - 126 U/L 82  64  52   AST 15 - 41 U/L 16  12  18    ALT 0 - 44 U/L 11  11  12       No results found for: "CEA1", "CEA" / No results found for: "CEA1", "CEA" No results found for: "PSA1" No results found for: "GEX528" No results found for: "CAN125"  No results found for: "TOTALPROTELP", "ALBUMINELP", "A1GS", "A2GS", "BETS", "BETA2SER", "GAMS", "MSPIKE", "SPEI" Lab Results  Component Value Date   TIBC 215 (L) 03/31/2013  FERRITIN 784 (H) 03/31/2013   IRONPCTSAT 5 (L) 03/31/2013   No results found for: "LDH"   STUDIES:   CT Chest W Contrast  Result Date: 01/06/2023 CLINICAL DATA:  Restaging non-small cell lung cancer. * Tracking Code: BO * EXAM: CT CHEST WITH CONTRAST TECHNIQUE: Multidetector CT imaging of the chest was performed during intravenous contrast administration. RADIATION DOSE REDUCTION: This exam was performed according to the departmental dose-optimization program which includes automated exposure control, adjustment of the mA and/or kV according to patient size and/or use of iterative reconstruction technique. CONTRAST:  OMNIPAQUE IOHEXOL 300 MG/ML  SOLN COMPARISON:  Chest CT 07/16/2022 and 06/26/2022.  PET-CT 05/16/2022. FINDINGS: Cardiovascular: No acute vascular findings are identified. There is severe atherosclerosis of the aorta, great vessels and coronary arteries status post median sternotomy. There is irregular mural  thrombus throughout the aortic arch and descending aorta. The heart size is normal. There is no pericardial effusion. Mediastinum/Nodes: There are no enlarged mediastinal, hilar or axillary lymph nodes.Scattered small mediastinal lymph nodes are unchanged. The thyroid gland, trachea and esophagus demonstrate no significant findings. Lungs/Pleura: No pleural effusion or pneumothorax. Mild centrilobular emphysema. The spiculated nodule in the left upper lobe is unchanged, measuring 10 x 8 mm on image 59/3. Adjacent fiducial marker. No new or enlarging pulmonary nodules are identified. The patchy airspace disease noted in the right lower lobe on the most recent study has resolved. There is underlying mild linear atelectasis or scar in the right lower and middle lobes. Upper abdomen: No significant findings are seen in the visualized upper abdomen. Stable exophytic cystic lesion projecting superiorly from the upper pole of the right kidney for which no specific follow-up imaging is recommended. Musculoskeletal/Chest wall: There are subacute appearing fractures of the right 8th, 9th and 10th ribs posteriorly. There are old left-sided rib fractures. Multiple thoracic compression deformities are grossly stable. However, there is a new superior endplate compression deformity/Schmorl's node at L2 which does not demonstrate any definite pathologic features. Unless specific follow-up recommendations are mentioned in the findings or impression sections, no imaging follow-up of any mentioned incidental findings is recommended. IMPRESSION: 1. Stable spiculated left upper lobe nodule with adjacent fiducial marker. 2. No evidence of local recurrence or metastatic disease. 3. Interval resolution of previously demonstrated right lower lobe airspace disease. 4. New superior endplate compression deformity/Schmorl's node at L2 without definite pathologic features. Additional thoracic compression deformities are grossly stable. New  subacute-appearing posterior right rib fractures. 5. Aortic Atherosclerosis (ICD10-I70.0) and Emphysema (ICD10-J43.9). Electronically Signed   By: Carey Bullocks M.D.   On: 01/06/2023 09:45

## 2023-01-06 NOTE — Patient Instructions (Addendum)
[facility]  Discharge Instructions  You were seen and examined today by Dr. Ellin Saba.  Dr. Ellin Saba discussed your most recent lab work and scan which revealed that your blood sugar is slightly elevated all the other labs looks stable. Your scan looks good.  Dr. Ellin Saba is going to check a bone density scan before your next appointment.  Follow-up as scheduled in 6 months.    Thank you for choosing [facility] to provide your oncology and hematology care.   To afford each patient quality time with our provider, please arrive at least 15 minutes before your scheduled appointment time. You may need to reschedule your appointment if you arrive late (10 or more minutes). Arriving late affects you and other patients whose appointments are after yours.  Also, if you miss three or more appointments without notifying the office, you may be dismissed from the clinic at the provider's discretion.    Again, thank you for choosing [facility].  Our hope is that these requests will decrease the amount of time that you wait before being seen by our physicians.   If you have a lab appointment with the Cancer Center - please note that after April 8th, all labs will be drawn in the cancer center.  You do not have to check in or register with the main entrance as you have in the past but will complete your check-in at the cancer center.            _____________________________________________________________  Should you have questions after your visit to [facility], please contact our office at [phone] and follow the prompts.  Our office hours are 8:00 a.m. to 4:30 p.m. Monday - Thursday and 8:00 a.m. to 2:30 p.m. Friday.  Please note that voicemails left after 4:00 p.m. may not be returned until the following business day.  We are closed weekends and all major holidays.  You do have access to a nurse 24-7, just  call the main number to the clinic [phone] and do not press any options, hold on the line and a nurse will answer the phone.    For prescription refill requests, have your pharmacy contact our office and allow 72 hours.    Masks are no longer required in the cancer centers. If you would like for your care team to wear a mask while they are taking care of you, please let them know. You may have one support person who is at least 70 years old accompany you for your appointments.

## 2023-01-17 ENCOUNTER — Encounter: Payer: Self-pay | Admitting: Internal Medicine

## 2023-01-17 ENCOUNTER — Ambulatory Visit: Payer: Medicare HMO | Attending: Internal Medicine | Admitting: Internal Medicine

## 2023-01-17 VITALS — BP 138/78 | HR 64 | Ht 67.0 in | Wt 134.0 lb

## 2023-01-17 DIAGNOSIS — I480 Paroxysmal atrial fibrillation: Secondary | ICD-10-CM | POA: Insufficient documentation

## 2023-01-17 NOTE — Progress Notes (Signed)
Cardiology Office Note  Date: 01/17/2023   ID: Kamarri, Asai 09-24-1950, MRN [id number]  PCP:  Alvina Filbert, MD  Cardiologist:  Marjo Bicker, MD Electrophysiologist:  None    History of Present Illness: Holly May is a 72 y.o. 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  Patient was admitted to [facility] in 07/2022 with COPD exacerbation.  EKG showed sinus tachycardia with frequent PACs. She was referred to cardiology clinic to rule out atrial fibrillation.  She underwent 2-week event monitor in 09/2022 that showed 14% A-fib burden, asymptomatic.  She was started on bisoprolol 5 mg once daily and Eliquis 5 mg twice daily.  Continues to be asymptomatic, denies having any angina, DOE, orthopnea, PND, palpitations, fatigue, syncope, presyncope.  She has stable SOB but no recent worsening.  Current smoker.  Past Medical History:  Diagnosis Date   Arthritis    Cancer (HCC)    lung   COPD (chronic obstructive pulmonary disease) (HCC)    Depression    Dyspnea    Essential hypertension, benign    Type 2 diabetes mellitus (HCC)    Urinary incontinence     Past Surgical History:  Procedure Laterality Date   BRONCHIAL BIOPSY  07/09/2022   Procedure: BRONCHIAL BIOPSIES;  Surgeon: Josephine Igo, DO;  Location: [facility];  Service: Pulmonary;;   BRONCHIAL BRUSHINGS  07/09/2022   Procedure: BRONCHIAL BRUSHINGS;  Surgeon: Josephine Igo, DO;  Location: [facility];  Service: Pulmonary;;   BRONCHIAL NEEDLE ASPIRATION BIOPSY  07/09/2022   Procedure: BRONCHIAL NEEDLE ASPIRATION BIOPSIES;  Surgeon: Josephine Igo, DO;  Location: [facility];  Service: Pulmonary;;   CHOLECYSTECTOMY     FIDUCIAL MARKER PLACEMENT  07/09/2022   Procedure: FIDUCIAL MARKER PLACEMENT;  Surgeon: Josephine Igo, DO;  Location: [facility];  Service: Pulmonary;;   FRACTURE SURGERY Left    arm   OPEN REDUCTION INTERNAL  FIXATION (ORIF) PROXIMAL PHALANX Left 04/11/2017   Procedure: OPEN REDUCTION INTERNAL FIXATION (ORIF) PROXIMAL PHALANX;  Surgeon: Dairl Ponder, MD;  Location: [facility];  Service: Orthopedics;  Laterality: Left;   RESECTION OF MEDIASTINAL MASS N/A 02/18/2019   Procedure: RESECTION OF MEDIASTINAL MASS;  Surgeon: Kerin Perna, MD;  Location: [facility] OR;  Service: Thoracic;  Laterality: N/A;   STERNOTOMY N/A 02/18/2019   Procedure: STERNOTOMY;  Surgeon: Kerin Perna, MD;  Location: [facility] OR;  Service: Thoracic;  Laterality: N/A;   VIDEO BRONCHOSCOPY WITH ENDOBRONCHIAL ULTRASOUND Bilateral 07/09/2022   Procedure: VIDEO BRONCHOSCOPY WITH ENDOBRONCHIAL ULTRASOUND;  Surgeon: Josephine Igo, DO;  Location: [facility];  Service: Pulmonary;  Laterality: Bilateral;    Current Outpatient Medications  Medication Sig Dispense Refill   albuterol (PROVENTIL HFA;VENTOLIN HFA) 108 (90 Base) MCG/ACT inhaler Inhale 2 puffs into the lungs every 4 (four) hours as needed for wheezing or shortness of breath (cough, shortness of breath or wheezing.). (Patient taking differently: Inhale 2 puffs into the lungs every 4 (four) hours as needed for wheezing or shortness of breath (cough).) 1 Inhaler 3   alendronate (FOSAMAX) 70 MG tablet Take 70 mg by mouth once a week. Wednesday     amLODipine (NORVASC) 5 MG tablet Take 5 mg by mouth daily.     apixaban (ELIQUIS) 5 MG TABS tablet Take 1 tablet (5 mg total) by mouth 2 (two) times daily. 60 tablet 11   bisoprolol (ZEBETA) 5 MG tablet Take 1  tablet (5 mg total) by mouth daily. 30 tablet 11   citalopram (CELEXA) 20 MG tablet Take 1 tablet (20 mg total) by mouth daily. 30 tablet 0   fluticasone furoate-vilanterol (BREO ELLIPTA) 100-25 MCG/ACT AEPB Inhale 1 puff into the lungs daily.     gabapentin (NEURONTIN) 300 MG capsule Take 300 mg by mouth daily.     ipratropium-albuterol (DUONEB) 0.5-2.5 (3) MG/3ML SOLN Take 3 mLs by nebulization every 6 (six) hours as  needed (wheezing, shortness of breath).     lisinopril (ZESTRIL) 40 MG tablet Take 40 mg by mouth daily.     lovastatin (MEVACOR) 40 MG tablet Take 40 mg by mouth daily.     mirtazapine (REMERON) 15 MG tablet Take 15 mg by mouth at bedtime.     omeprazole (PRILOSEC) 20 MG capsule Take 20 mg by mouth daily.     oxybutynin (DITROPAN) 5 MG tablet Take 5 mg by mouth 3 (three) times daily.      TRELEGY ELLIPTA 100-62.5-25 MCG/ACT AEPB Inhale 1 puff into the lungs daily.     No current facility-administered medications for this visit.   Allergies:  Patient has no known allergies.   Social History: The patient  reports that she has been smoking cigarettes. She started smoking about 52 years ago. She has a 78 pack-year smoking history. She has never used smokeless tobacco. She reports current alcohol use. She reports that she does not use drugs.   Family History: The patient's family history includes Diabetes Mellitus II in her mother and sister; Hypertension in her brother and sister.   ROS:  Please see the history of present illness. Otherwise, complete review of systems is positive for none.  All other systems are reviewed and negative.   Physical Exam: VS:  BP (!) 208/106   Pulse 64   Ht 5\' 7"  (1.702 m)   Wt 134 lb (60.8 kg)   SpO2 98%   BMI 20.99 kg/m , BMI Body mass index is 20.99 kg/m.  Wt Readings from Last 3 Encounters:  01/17/23 134 lb (60.8 kg)  01/06/23 137 lb 4.8 oz (62.3 kg)  12/31/22 136 lb (61.7 kg)    General: Patient appears comfortable at rest. HEENT: Conjunctiva and lids normal, oropharynx clear with moist mucosa. Neck: Supple, no elevated JVP or carotid bruits, no thyromegaly. Lungs: Clear to auscultation, nonlabored breathing at rest. Cardiac: Regular rate and rhythm, no S3 or significant systolic murmur, no pericardial rub. Abdomen: Soft, nontender, no hepatomegaly, bowel sounds present, no guarding or rebound. Extremities: No pitting edema, distal pulses  2+. Skin: Warm and dry. Musculoskeletal: No kyphosis. Neuropsychiatric: Alert and oriented x3, affect grossly appropriate.  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  Paroxysmal A-fib: Asymptomatic, EKG today showed NSR.  Event monitor from 09/2022 showed 14% A-fib burden, asymptomatic.  Continue bisoprolol 5 mg once daily and Eliquis 5 mg twice daily.  A-fib likely secondary to severe COPD. defer OSA evaluation as she does not want to wear CPAP at this time.  Severe COPD, lung adenocarcinoma radiation therapy and nicotine abuse: Current smoker, trying very hard to quit, down down for lung surgery due  to severe COPD and low lung reserve.  HTN, controlled: Continue amlodipine 5 mg once daily, lisinopril 40 mg once daily and bisoprolol 5 mg once daily.  Initial BP was 208 mmHg SBP but repeat BP was 130 mmHg SBP.   I have spent a total duration of 30 minutes reviewing prior notes, event monitor results, face-to-face discussion of her medical condition, evaluation, management, documenting the findings in the note.  I answered all the questions.   Disposition:  Follow up 1 year  Signed, Breshay Ilg Verne Spurr, MD, 01/17/2023 11:12 AM    [facility] at [facility] [place] [address] [place], [place] [address]

## 2023-01-17 NOTE — Patient Instructions (Signed)
Medication Instructions:  Your physician recommends that you continue on your current medications as directed. Please refer to the Current Medication list given to you today.  *If you need a refill on your cardiac medications before your next appointment, please call your pharmacy*   Lab Work: None If you have labs (blood work) drawn today and your tests are completely normal, you will receive your results only by: MyChart Message (if you have MyChart) OR A paper copy in the mail If you have any lab test that is abnormal or we need to change your treatment, we will call you to review the results.   Testing/Procedures: None   Follow-Up: At [facility], you and your health needs are our priority.  As part of our continuing mission to provide you with exceptional heart care, we have created designated Provider Care Teams.  These Care Teams include your primary Cardiologist (physician) and Advanced Practice Providers (APPs -  Physician Assistants and Nurse Practitioners) who all work together to provide you with the care you need, when you need it.  We recommend signing up for the patient portal called "MyChart".  Sign up information is provided on this After Visit Summary.  MyChart is used to connect with patients for Virtual Visits (Telemedicine).  Patients are able to view lab/test results, encounter notes, upcoming appointments, etc.  Non-urgent messages can be sent to your provider as well.   To learn more about what you can do with MyChart, go to [email or web address].    Your next appointment:   1 year(s)  Provider:   Vishnu Mallipeddi, MD    Other Instructions    

## 2023-02-11 ENCOUNTER — Encounter: Payer: Self-pay | Admitting: Internal Medicine

## 2023-02-11 ENCOUNTER — Telehealth: Payer: Self-pay | Admitting: Internal Medicine

## 2023-02-11 ENCOUNTER — Ambulatory Visit (INDEPENDENT_AMBULATORY_CARE_PROVIDER_SITE_OTHER): Payer: Medicare HMO | Admitting: Internal Medicine

## 2023-02-11 VITALS — BP 142/82 | HR 63 | Ht 67.0 in | Wt 140.0 lb

## 2023-02-11 DIAGNOSIS — C3492 Malignant neoplasm of unspecified part of left bronchus or lung: Secondary | ICD-10-CM | POA: Insufficient documentation

## 2023-02-11 DIAGNOSIS — I10 Essential (primary) hypertension: Secondary | ICD-10-CM | POA: Diagnosis not present

## 2023-02-11 DIAGNOSIS — J449 Chronic obstructive pulmonary disease, unspecified: Secondary | ICD-10-CM

## 2023-02-11 DIAGNOSIS — F17210 Nicotine dependence, cigarettes, uncomplicated: Secondary | ICD-10-CM

## 2023-02-11 DIAGNOSIS — R630 Anorexia: Secondary | ICD-10-CM | POA: Insufficient documentation

## 2023-02-11 DIAGNOSIS — I499 Cardiac arrhythmia, unspecified: Secondary | ICD-10-CM | POA: Insufficient documentation

## 2023-02-11 MED ORDER — OLMESARTAN MEDOXOMIL 40 MG PO TABS: 40.0000 mg | ORAL_TABLET | Freq: Every day | ORAL | 11 refills | Status: AC

## 2023-02-11 MED ORDER — PREDNISONE 10 MG PO TABS: ORAL_TABLET | ORAL | 0 refills | Status: DC

## 2023-02-11 NOTE — Telephone Encounter (Signed)
Melissa would like to know if Lisinopril is being stopped. Melissa phone number is [phone].

## 2023-02-11 NOTE — Progress Notes (Signed)
Holly May, female    DOB: 1950-11-08    MRN: [id number]   Brief patient profile:  58  yobf  active smoker  referred to pulmonary clinic in [place]  04/05/2022 by Holly May  for SPN in setting  variable doe attributed to  Copd / AB  x  2017    S/p thymomectomy by Holly May 2020 s adjuctive rx with preop pfts c/w GOLD 3 copd    History of Present Illness  04/05/2022  Pulmonary/ 1st May eval/ Holly May / Holly May May on acei and DPI  Chief Complaint  Patient presents with   Consult    SOB   Dyspnea:  mb and back 241ft  each way level and so short of breath  sometimes has to stop but varies s pattern  Cough: clear mucus variably producttive esp p lie down sometimes wakes her up p asleep/ flat 2 pillows  SABA use: 3 x weekly  02: none Rec We will walk you today to see if you qualify for 0xygen  Start Trelegy 100 one click each For cough / congestion > mucinex 1200 mg every 12 hours as needed  Levaquin 500 mg one daily x 10 days  CT chest needs to be done after Feb 3rd 2024  The key is to stop smoking completely before smoking completely stops you! 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 > SBRT  /turned down for resection by Holly May   08/21/2022  f/u ov/Holly May/Holly May re: GOLD 3 copd  maint on trelegy  Chief Complaint  Patient presents with   Follow-up    Pt f/u states that shse is doing well she begins her cancer treatment next week, she does believe that her breathing is getting better  Dyspnea:  [employer] walking ok Cough: minimal / thinks she's no longer on acei  Sleeping: flat bed 3 pillows baseline  SABA use: 3 x weekly / neb rarely  02: none  Rec Call us back with the spelling of the new blood pressure pill  Plan A = Automatic = Always=   Trelegy 100 one puff each am   Plan B =  Backup (to supplement plan A, not to replace it) Only use your albuterol inhaler as a rescue  Plan C = Crisis (instead of Plan B but only if Plan B stops working) - only use your albuterol nebulizer if you first try Plan  Please schedule a follow up visit in 3 months but call sooner if needed  with all medications /inhalers/ solutions in hand    Stage I (T1 N0 M0) left upper lobe lung adenocarcinoma: - She has completed radiation therapy on 09/02/2022.   12/31/2022  f/u ov/Holly May/Holly May re: GOLD 3 copd  maint on BREO  did not  bring resp meds/ brought out from pharmacy for pill organizer bubbles Chief Complaint  Patient presents with   Follow-up    Follow up   Dyspnea:  still walking at [employer] / [employer] parking  Cough: none  Sleeping: flat bed / 2 pillow s resp cc  SABA use: twice a week/ maybe twice a month 02: none  Rec The key is to stop smoking completely before smoking completely stops you! Please schedule a follow up May visit in 6 weeks, call  sooner if needed with all respiratory  medications /inhalers/ solutions in hand    02/11/2023  f/u ov/Furnas May/Holly May re: GOLD 3  maint on trelegy 100  did  bring meds (includes lisinopril 40 and fosamax)  Chief Complaint  Patient presents with   COPD GOLD 3/ group E still smoking   Dyspnea:  ? A littler better, still doing [employer]/ uses [employer]  Cough: worse since last ov esp when lie down > min mucoid Sleeping: flat bed/ 2 pillows s    resp cc  SABA use: q 2-3 days  02: none    No obvious day to day or daytime variability or assoc  purulent sputum or mucus plugs or hemoptysis or cp or chest tightness, subjective wheeze or overt sinus or hb symptoms.    Also denies any obvious fluctuation of symptoms with weather or environmental changes or other aggravating or alleviating factors except as outlined above   No unusual exposure hx or h/o childhood pna/ asthma or knowledge of premature birth.  Current Allergies, Complete  Past Medical History, Past Surgical History, Family History, and Social History were reviewed in Holly May record.  ROS  The following are not active complaints unless bolded Hoarseness, sore throat, dysphagia, dental problems, itching, sneezing,  nasal congestion or discharge of excess mucus or purulent secretions, ear ache,   fever, chills, sweats, unintended wt loss or wt gain, classically pleuritic or exertional cp,  orthopnea pnd or arm/hand swelling  or leg swelling, presyncope, palpitations, abdominal pain, anorexia, nausea, vomiting, diarrhea  or change in bowel habits or change in bladder habits, change in stools or change in urine, dysuria, hematuria,  rash, arthralgias, visual complaints, headache, numbness, weakness or ataxia or problems with walking or coordination,  change in mood or  memory.        Current Meds  Medication Sig   albuterol (PROVENTIL HFA;VENTOLIN HFA) 108 (90 Base) MCG/ACT inhaler Inhale 2 puffs into the lungs every 4 (four) hours as needed for wheezing or shortness of breath (cough, shortness of breath or wheezing.). (Patient taking differently: Inhale 2 puffs into the lungs every 4 (four) hours as needed for wheezing or shortness of breath (cough).)   alendronate (FOSAMAX) 70 MG tablet Take 70 mg by mouth once a week. Wednesday   amLODipine (NORVASC) 5 MG tablet Take 5 mg by mouth daily.   apixaban (ELIQUIS) 5 MG TABS tablet Take 1 tablet (5 mg total) by mouth 2 (two) times daily.   bisoprolol (ZEBETA) 5 MG tablet Take 1 tablet (5 mg total) by mouth daily.   citalopram (CELEXA) 20 MG tablet Take 1 tablet (20 mg total) by mouth daily.   fluticasone furoate-vilanterol (BREO ELLIPTA) 100-25 MCG/ACT AEPB Inhale 1 puff into the lungs daily.   gabapentin (NEURONTIN) 300 MG capsule Take 300 mg by mouth daily.   ipratropium-albuterol (DUONEB) 0.5-2.5 (3) MG/3ML SOLN Take 3 mLs by nebulization every 6 (six) hours as needed (wheezing, shortness of  breath).   lisinopril (ZESTRIL) 40 MG tablet Take 40 mg by mouth daily.   lovastatin (MEVACOR) 40 MG tablet Take 40 mg by mouth daily.   mirtazapine (REMERON) 15 MG tablet Take 15 mg by mouth at bedtime.   omeprazole (PRILOSEC) 20 MG capsule Take 20 mg by mouth daily.   oxybutynin (DITROPAN) 5 MG tablet Take 5 mg by mouth 3 (three) times daily.    TRELEGY ELLIPTA 100-62.5-25 MCG/ACT AEPB Inhale 1 puff into the lungs daily.  Past Medical History:  Diagnosis Date   Arthritis    COPD (chronic obstructive pulmonary disease) (HCC)    Depression    Dyspnea    Essential hypertension, benign    Type 2 diabetes mellitus (HCC)    Urinary incontinence        Objective:    Wts  02/11/2023       140  12/31/2022        136   08/21/2022        143  05/16/22 135 lb 12.8 oz (61.6 kg)  05/14/22 134 lb (60.8 kg)  04/05/22 134 lb 6.4 oz (61 kg)     Vital signs reviewed  02/11/2023  - Note at rest 02 sats  91% on RA   General appearance:    chronically ill and easily confused with details of care  HEENT : Oropharynx  clear   Nasal turbinates nl    NECK :  without  apparent JVD/ palpable Nodes/TM    LUNGS: no acc muscle use,  Mild barrel  contour chest wall with bilateral  Distant exp wheeze and  without cough on insp or exp maneuvers  and mild  Hyperresonant  to  percussion bilaterally     CV:  RRR  no s3 or murmur or increase in P2, and no edema   ABD:  soft and nontender with pos end  insp Hoover's  in the supine position.  No bruits or organomegaly appreciated   MS:  Nl gait/ ext warm without deformities Or obvious joint restrictions  calf tenderness, cyanosis or clubbing     SKIN: warm and dry without lesions    NEURO:  alert, approp, nl sensorium with  no motor or cerebellar deficits apparent.       .     I personally reviewed images and agree with radiology impression as follows:   Chest CT9/30/24    w with contrast 1. Stable spiculated left upper  lobe nodule with adjacent fiducial marker. 2. No evidence of local recurrence or metastatic disease. 3. Interval resolution of previously demonstrated right lower lobe airspace disease. 4. New superior endplate compression deformity/Schmorl's node at L2 without definite pathologic features. Additional thoraciccompression deformities are grossly stable. New subacute-appearingposterior right rib fractures. 5. Aortic Atherosclerosis (ICD10-I70.0) and Emphysema (ICD10-J43.9).      Assessment

## 2023-02-11 NOTE — Assessment & Plan Note (Signed)
Trial off ACEi 04/05/2022 for chronic cough > try again 02/11/2023   In the best review of chronic cough to date ( NEJM 2016 375 [id number]) ,  ACEi are now felt to cause cough in up to  20% of pts which is a 4 fold increase from previous reports and does not include the variety of non-specific complaints we see in pulmonary clinic in pts on ACEi but previously attributed to another dx like  Copd/asthma and  include PNDS, throat and chest congestion, "bronchitis", unexplained dyspnea and noct "strangling" sensations, and hoarseness, but also  atypical /refractory GERD symptoms like dysphagia and "bad heartburn"   The only way I know  to prove this is not an "ACEi Case" is a trial off ACEi x a minimum of 6 weeks then regroup.    >>> change to benecar 40 mg daily and f/u in 6 weeks, sooner if needed  Discussed in detail all the  indications, usual  risks and alternatives  relative to the benefits with patient who agrees to proceed with Rx as outlined.             Each maintenance medication was reviewed in detail including emphasizing most importantly the difference between maintenance and prns and under what circumstances the prns are to be triggered using an action plan format where appropriate.  Total time for H and P, chart review, counseling, reviewing hfa  device(s) and generating customized AVS unique to this office visit / same day charting = 33 min

## 2023-02-11 NOTE — Patient Instructions (Addendum)
Stop lisinopril   Plan A = Automatic = Always=  Trelegy 100 one click each AM   - take your time!  Work on inhaler technique:  relax and gently blow all the way out then take a nice smooth full deep breath back in,   Hold breath in for at least  5 seconds if you can. Blow out trelegy out  thru nose. Rinse and gargle with water when done.  If mouth or throat bother you at all,  try brushing teeth/gums/tongue with arm and hammer toothpaste/ make a slurry and gargle and spit out.     Plan B = Backup (to supplement plan A, not to replace it) Only use your albuterol inhaler as a rescue medication to be used if you can't catch your breath by resting or doing a relaxed purse lip breathing pattern.  - The less you use it, the better it will work when you need it. - Ok to use the inhaler up to 2 puffs  every 4 hours if you must but call for appointment if use goes up over your usual need - Don't leave home without it !!  (think of it like the spare tire for your car)   Plan C = Crisis (instead of Plan B but only if Plan B stops working) - only use your albuterol nebulizer if you first try Plan B and it fails to help > ok to use the nebulizer up to every 4 hours but if start needing it regularly call for immediate appointment  Stop lisinopril and replace with benicar (olemsartan)  40 mg one daily   Prednisone 10 mg  2 daily until better and one daily for a week and stop    Please schedule a follow up office visit in 6 weeks, call sooner if needed with all medications /inhalers/ solutions in hand so we can verify exactly what you are taking. This includes all medications from all doctors and over the counters

## 2023-02-11 NOTE — Assessment & Plan Note (Signed)
Counseled re importance of smoking cessation but did not meet time criteria for separate billing   °

## 2023-02-11 NOTE — Assessment & Plan Note (Signed)
Active smoker - PFT's  02/16/2019  FEV1 0.87 (39 % ) ratio 0.39  p 7 % improvement from saba p ? prior to study with DLCO  10.88 (49%)   and FV curve classically concave  - 04/05/2022  After extensive coaching inhaler device,  effectiveness =    75% with dpi > trelegy trial  - d/c ACE 04/05/2022  - 04/05/2022   Walked on RA  x  3  lap(s) =  approx 450  ft  @ mod pace, stopped due to end of study with lowest 02 sats 95% no sob   - PFT's  06/03/22  FEV1 0.95 (38 % ) ratio 0.54  p 0 % improvement from saba p 0 prior to study with DLCO  10.31 (49%)   and FV curve mildly concave/somewhat atyical and ERV 6% at wt 144   - 02/11/2023  After extensive coaching inhaler device,  effectiveness =    75% with dpi, 25% with hfa  - 02/11/2023 try pred 20 until better then 10 mg daily x 5 d and off - 02/11/2023    d/c acei   DDX of  difficult airways management almost all start with A and  include Adherence, Ace Inhibitors, Acid Reflux, Active Sinus Disease, Alpha 1 Antitripsin deficiency, Anxiety masquerading as Airways dz,  ABPA,  Allergy(esp in young), Aspiration (esp in elderly), Adverse effects of meds,  Active smoking or vaping, A bunch of PE's (a small clot burden can't cause this syndrome unless there is already severe underlying pulm or vascular dz with poor reserve) plus two Bs  = Bronchiectasis and Beta blocker use..and one C= CHF   Adherence is always the initial "prime suspect" and is a multilayered concern that requires a "trust but verify" approach in every patient - starting with knowing how to use medications, especially inhalers, correctly, keeping up with refills and understanding the fundamental difference between maintenance and prns vs those medications only taken for a very short course and then stopped and not refilled.  - see hfa teaching - return  with all meds in hand using a trust but verify approach to confirm accurate Medication  Reconciliation The principal here is that until we are certain that  the  patients are doing what we've asked, it makes no sense to ask them to do more.   Active smoking at the top of concerns (see separate a/p)   ACEi adverse effects at next on the usual list of suspects and the only way to rule it out is a trial off > see a/p    ? Allergy /asthma > try prednisone rx as above  ? GERD on fosamax > low threshold to change to IV reclast vs Prolia if airway issues don't improve

## 2023-02-11 NOTE — Telephone Encounter (Signed)
OV 02/11/23: Stop lisinopril and replace with benicar (olemsartan) 40 mg one daily   Spoke with pharmacy to advise replace Lisinopril with Olmesartan. Nothing further needed at this time.

## 2023-03-17 NOTE — Progress Notes (Signed)
Holly May, female    DOB: 21-Feb-1951    MRN: [id number]   Brief patient profile:  78  yobf  active smoker  referred to pulmonary clinic in [place]  04/05/2022 by Alvina Filbert  for SPN in setting  variable doe attributed to  Copd / AB  x  2017    S/p thymomectomy by Morton Peters 2020 s adjuctive rx with preop pfts c/w GOLD 3 copd    History of Present Illness  04/05/2022  Pulmonary/ 1st office eval/ Holly May / [facility] on acei and DPI  Chief Complaint  Patient presents with   Consult    SOB   Dyspnea:  mb and back 236ft  each way level and so short of breath  sometimes has to stop but varies s pattern  Cough: clear mucus variably producttive esp p lie down sometimes wakes her up p asleep/ flat 2 pillows  SABA use: 3 x weekly  02: none Rec We will walk you today to see if you qualify for 0xygen  Start Trelegy 100 one click each For cough / congestion > mucinex 1200 mg every 12 hours as needed  Levaquin 500 mg one daily x 10 days  CT chest needs to be done after Feb 3rd 2024  The key is to stop smoking completely before smoking completely stops you! 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 > SBRT  /turned down for resection by Dorris Fetch   08/21/2022  f/u ov/[place] office/Holly May re: GOLD 3 copd  maint on trelegy  Chief Complaint  Patient presents with   Follow-up    Pt f/u states that shse is doing well she begins her cancer treatment next week, she does believe that her breathing is getting better  Dyspnea:  [employer] walking ok Cough: minimal / thinks she's no longer on acei  Sleeping: flat bed 3 pillows baseline  SABA use: 3 x weekly / neb rarely  02: none  Rec Call us back with the spelling of the new blood pressure pill  Plan A = Automatic = Always=   Trelegy 100 one puff each am   Plan B =  Backup (to supplement plan A, not to replace it) Only use your albuterol inhaler as a rescue  Plan C = Crisis (instead of Plan B but only if Plan B stops working) - only use your albuterol nebulizer if you first try Plan  Please schedule a follow up visit in 3 months but call sooner if needed  with all medications /inhalers/ solutions in hand    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   02/11/2023  f/u ov/[place] office/Holly May re: GOLD 3  maint on trelegy 100  did  bring meds (includes lisinopril 40 and fosamax)  Chief Complaint  Patient presents with   COPD GOLD 3/ group E still smoking   Dyspnea:  ? A little better, still doing [employer]/ uses HC  Cough: worse since last ov esp when lie down > min mucoid Sleeping: flat bed/ 2 pillows s    resp cc  SABA use: q 2-3 days  02: none  Rec Stop lisinopril  Plan A = Automatic = Always=  Trelegy 100 one click each  AM   - take your time! Work on inhaler technique:  Plan B = Backup (to supplement plan A, not to replace it) Only use your albuterol inhaler as a rescue medication  Plan C = Crisis (instead of Plan B but only if Plan B stops working) - only use your albuterol nebulizer if you first try Plan B  Stop lisinopril and replace with benicar (olemsartan)  40 mg one daily   Prednisone 10 mg  2 daily until better and one daily for a week and stop   Please schedule a follow up office visit in 6 weeks, call sooner if needed with all medications /inhalers/ solutions in hand    03/18/2023  f/u ov/[place] office/Holly May re: GOLD 3 copd  maint on Trelegy 100    Chief Complaint  Patient presents with   COPD   Shortness of Breath  Dyspnea:  still walking at [employer] Cough: smoker's rattle > white  Sleeping: flat bed/ 2 pillows s    resp cc  SABA use: has proair every few days  - nebulizer few times a week  02: none      No obvious day to day or daytime variability or  assoc excess/ purulent sputum or mucus plugs or hemoptysis or cp or chest tightness, subjective wheeze or overt sinus or hb symptoms.    Also denies any obvious fluctuation of symptoms with weather or environmental changes or other aggravating or alleviating factors except as outlined above   No unusual exposure hx or h/o childhood pna/ asthma or knowledge of premature birth.  Current Allergies, Complete Past Medical History, Past Surgical History, Family History, and Social History were reviewed in [employer] record.  ROS  The following are not active complaints unless bolded Hoarseness, sore throat, dysphagia, dental problems, itching, sneezing,  nasal congestion or discharge of excess mucus or purulent secretions, ear ache,   fever, chills, sweats, unintended wt loss or wt gain, classically pleuritic or exertional cp,  orthopnea pnd or arm/hand swelling  or leg swelling, presyncope, palpitations, abdominal pain, anorexia, nausea, vomiting, diarrhea  or change in bowel habits or change in bladder habits, change in stools or change in urine, dysuria, hematuria,  rash, arthralgias, visual complaints, headache, numbness, weakness or ataxia or problems with walking or coordination,  change in mood or  memory.        Current Meds  Medication Sig   albuterol (PROVENTIL HFA;VENTOLIN HFA) 108 (90 Base) MCG/ACT inhaler Inhale 2 puffs into the lungs every 4 (four) hours as needed for wheezing or shortness of breath (cough, shortness of breath or wheezing.). (Patient taking differently: Inhale 2 puffs into the lungs every 4 (four) hours as needed for wheezing or shortness of breath (cough).)   alendronate (FOSAMAX) 70 MG tablet Take 70 mg by mouth once a week. Wednesday   amLODipine (NORVASC) 5 MG tablet Take 5 mg by mouth daily.   apixaban (ELIQUIS) 5 MG TABS tablet Take 1 tablet (5 mg total) by mouth 2 (two) times daily.   bisoprolol (ZEBETA) 5 MG tablet Take 1 tablet (5 mg total)  by mouth daily.   citalopram (CELEXA) 20 MG tablet Take 1 tablet (20 mg total) by mouth daily.   gabapentin (NEURONTIN) 300 MG capsule Take 300 mg by mouth daily.   ipratropium-albuterol (DUONEB) 0.5-2.5 (3) MG/3ML SOLN Take 3 mLs by nebulization every 6 (six) hours as needed (wheezing, shortness of breath).   lovastatin (MEVACOR) 40 MG tablet Take 40 mg by  mouth daily.   mirtazapine (REMERON) 15 MG tablet Take 15 mg by mouth at bedtime.   olmesartan (BENICAR) 40 MG tablet Take 1 tablet (40 mg total) by mouth daily.   omeprazole (PRILOSEC) 20 MG capsule Take 20 mg by mouth daily.   oxybutynin (DITROPAN) 5 MG tablet Take 5 mg by mouth 3 (three) times daily.    predniSONE (DELTASONE) 10 MG tablet 2 daily with breakfast until better then 1 daily x  1 week and stop   TRELEGY ELLIPTA 100-62.5-25 MCG/ACT AEPB Inhale 1 puff into the lungs daily.            Past Medical History:  Diagnosis Date   Arthritis    COPD (chronic obstructive pulmonary disease) (HCC)    Depression    Dyspnea    Essential hypertension, benign    Type 2 diabetes mellitus (HCC)    Urinary incontinence        Objective:    Wts  03/18/2023      141   02/11/2023       140  12/31/2022        136   08/21/2022        143  05/16/22 135 lb 12.8 oz (61.6 kg)  05/14/22 134 lb (60.8 kg)  04/05/22 134 lb 6.4 oz (61 kg)    Vital signs reviewed  03/18/2023  - Note at rest 02 sats  92% on RA   General appearance:    amb bf congested sounding cough    HEENT :  Oropharynx  clear     NECK :  without JVD/Nodes/TM/ nl carotid upstrokes bilaterally   LUNGS: no acc muscle use,  Mod barrel/  kyphotic   contour chest wall with bilateral  Distant bs s audible wheeze and  without cough on insp or exp maneuvers and mod  Hyperresonant  to  percussion bilaterally     CV:  RRR  no s3 or murmur or increase in P2, and no edema   ABD:  soft and nontender    MS:   Ext warm without deformities or   obvious joint restrictions ,  calf tenderness, cyanosis or clubbing  SKIN: warm and dry without lesions    NEURO:  alert, approp, nl sensorium with  no motor or cerebellar deficits apparent.         I personally reviewed images and agree with radiology impression as follows:   Chest CT 12/30/22      with contrast 1. Stable spiculated left upper lobe nodule with adjacent fiducial marker. 2. No evidence of local recurrence or metastatic disease. 3. Interval resolution of previously demonstrated right lower lobe airspace disease. 4. New superior endplate compression deformity/Schmorl's node at L2 without definite pathologic features. Additional thoraciccompression deformities are grossly stable. New subacute-appearingposterior right rib fractures. 5. Aortic Atherosclerosis (ICD10-I70.0) and Emphysema (ICD10-J43.9).      Assessment

## 2023-03-18 ENCOUNTER — Ambulatory Visit: Payer: Medicare HMO | Admitting: Internal Medicine

## 2023-03-18 ENCOUNTER — Encounter: Payer: Self-pay | Admitting: Internal Medicine

## 2023-03-18 VITALS — BP 132/78 | HR 81 | Ht 67.0 in | Wt 141.0 lb

## 2023-03-18 DIAGNOSIS — J449 Chronic obstructive pulmonary disease, unspecified: Secondary | ICD-10-CM

## 2023-03-18 DIAGNOSIS — F17210 Nicotine dependence, cigarettes, uncomplicated: Secondary | ICD-10-CM

## 2023-03-18 DIAGNOSIS — I10 Essential (primary) hypertension: Secondary | ICD-10-CM | POA: Diagnosis not present

## 2023-03-18 NOTE — Patient Instructions (Signed)
No change in medications  The key is to stop smoking completely before smoking completely stops you!   Please schedule a follow up visit in 6months but call sooner if needed  with all medications /inhalers/ solutions in hand so we can verify exactly what you are taking. This includes all medications from all doctors and over the counters

## 2023-03-18 NOTE — Assessment & Plan Note (Signed)
4-5 min discussion re active cigarette smoking in addition to office E&M  Ask about tobacco use:   ongoing  Advise quitting   matter of life or breath, especially since has such a tough time following instrutctions  Assess willingness:  Not committed at this point Assist in quit attempt:  Per PCP when ready Arrange follow up:   Follow up per Primary Care planned  For smoking cessation classes call [phone]

## 2023-03-18 NOTE — Assessment & Plan Note (Signed)
Trial off ACEi 04/05/2022 for chronic cough > try again 02/11/2023   Although even in retrospect it may not be clear the ACEi contributed to the pt's symptoms,    adding them back at this point or in the future would risk confusion in interpretation of non-specific respiratory symptoms to which this patient is prone  ie  Better not to muddy the waters here.    >>> continue benicar 40 mg daily with f/u by PCP planned          Each maintenance medication was reviewed in detail including emphasizing most importantly the difference between maintenance and prns and under what circumstances the prns are to be triggered using an action plan format where appropriate.  Total time for H and P, chart review, counseling, reviewing hfa/ dpi/ neb  device(s) and generating customized AVS unique to this office visit / same day charting > 30 min

## 2023-03-18 NOTE — Assessment & Plan Note (Signed)
Active smoker - PFT's  02/16/2019  FEV1 0.87 (39 % ) ratio 0.39  p 7 % improvement from saba p ? prior to study with DLCO  10.88 (49%)   and FV curve classically concave  - 04/05/2022  After extensive coaching inhaler device,  effectiveness =    75% with dpi > trelegy trial  - d/c ACE 04/05/2022  - 04/05/2022   Walked on RA  x  3  lap(s) =  approx 450  ft  @ mod pace, stopped due to end of study with lowest 02 sats 95% no sob   - PFT's  06/03/22  FEV1 0.95 (38 % ) ratio 0.54  p 0 % improvement from saba p 0 prior to study with DLCO  10.31 (49%)   and FV curve mildly concave/somewhat atyical and ERV 6% at wt 144   - 02/11/2023  After extensive coaching inhaler device,  effectiveness =    75% with dpi, 25% with hfa  - 02/11/2023 try pred 20 until better then 10 mg daily x 5 d and off - 02/11/2023    d/c acei    Group D (now reclassified as E) in terms of symptom/risk and laba/lama/ICS  therefore appropriate rx at this point >>>  continue trelegy and prn saba/ neb

## 2023-05-01 ENCOUNTER — Telehealth: Payer: Self-pay

## 2023-05-01 ENCOUNTER — Other Ambulatory Visit: Payer: Self-pay

## 2023-05-01 MED ORDER — APIXABAN 5 MG PO TABS: 5.0000 mg | ORAL_TABLET | Freq: Two times a day (BID) | ORAL | 5 refills | Status: DC

## 2023-05-01 MED ORDER — BISOPROLOL FUMARATE 5 MG PO TABS: 5.0000 mg | ORAL_TABLET | Freq: Every day | ORAL | 11 refills | Status: DC

## 2023-05-01 NOTE — Telephone Encounter (Signed)
Prescription refill request for Eliquis received. Indication: PAF Last office visit: 01/17/23  Lenord Carbo MD Scr: 0.92 on 12/30/22  Epic Age: 73 Weight: 60.8kg  Based on above findings Eliquis 5mg  twice daily is the appropriate dose.  Refill approved.

## 2023-05-01 NOTE — Telephone Encounter (Signed)
Called and lvm for patient to call us back regarding a fax that we have receive about change her medication to [facility] calling to confirm this with pt , before sending medication to this pharmacy. Left detail vm regarding this for patient to call us back to let us know.

## 2023-05-01 NOTE — Telephone Encounter (Signed)
Pt wanting medication bisoprolol sent to a different pharmacy, [facility]. Please address

## 2023-05-01 NOTE — Telephone Encounter (Signed)
Noted, no further action needed.

## 2023-05-01 NOTE — Telephone Encounter (Signed)
Patient called back and wants to "stick with " the pharmacy in [place] call back [phone]

## 2023-05-30 ENCOUNTER — Emergency Department (HOSPITAL_COMMUNITY): Payer: No Typology Code available for payment source

## 2023-05-30 ENCOUNTER — Inpatient Hospital Stay (HOSPITAL_COMMUNITY)
Admission: EM | Admit: 2023-05-30 | Discharge: 2023-06-06 | DRG: 871 | Disposition: A | Discharge status: Home / Self Care | Payer: No Typology Code available for payment source | Attending: Internal Medicine | Admitting: Internal Medicine

## 2023-05-30 ENCOUNTER — Other Ambulatory Visit: Payer: Self-pay

## 2023-05-30 DIAGNOSIS — R9431 Abnormal electrocardiogram [ECG] [EKG]: Secondary | ICD-10-CM | POA: Diagnosis present

## 2023-05-30 DIAGNOSIS — Z923 Personal history of irradiation: Secondary | ICD-10-CM

## 2023-05-30 DIAGNOSIS — F17210 Nicotine dependence, cigarettes, uncomplicated: Secondary | ICD-10-CM | POA: Diagnosis present

## 2023-05-30 DIAGNOSIS — N179 Acute kidney failure, unspecified: Secondary | ICD-10-CM | POA: Diagnosis present

## 2023-05-30 DIAGNOSIS — J441 Chronic obstructive pulmonary disease with (acute) exacerbation: Secondary | ICD-10-CM | POA: Diagnosis present

## 2023-05-30 DIAGNOSIS — M25552 Pain in left hip: Secondary | ICD-10-CM | POA: Diagnosis present

## 2023-05-30 DIAGNOSIS — Z7983 Long term (current) use of bisphosphonates: Secondary | ICD-10-CM

## 2023-05-30 DIAGNOSIS — J189 Pneumonia, unspecified organism: Secondary | ICD-10-CM | POA: Diagnosis present

## 2023-05-30 DIAGNOSIS — J440 Chronic obstructive pulmonary disease with acute lower respiratory infection: Secondary | ICD-10-CM | POA: Diagnosis present

## 2023-05-30 DIAGNOSIS — Z8249 Family history of ischemic heart disease and other diseases of the circulatory system: Secondary | ICD-10-CM

## 2023-05-30 DIAGNOSIS — I110 Hypertensive heart disease with heart failure: Secondary | ICD-10-CM | POA: Diagnosis present

## 2023-05-30 DIAGNOSIS — A419 Sepsis, unspecified organism: Secondary | ICD-10-CM | POA: Diagnosis not present

## 2023-05-30 DIAGNOSIS — E872 Acidosis, unspecified: Secondary | ICD-10-CM | POA: Diagnosis not present

## 2023-05-30 DIAGNOSIS — J9601 Acute respiratory failure with hypoxia: Secondary | ICD-10-CM | POA: Diagnosis present

## 2023-05-30 DIAGNOSIS — E876 Hypokalemia: Secondary | ICD-10-CM | POA: Diagnosis present

## 2023-05-30 DIAGNOSIS — Y92009 Unspecified place in unspecified non-institutional (private) residence as the place of occurrence of the external cause: Secondary | ICD-10-CM

## 2023-05-30 DIAGNOSIS — W19XXXA Unspecified fall, initial encounter: Secondary | ICD-10-CM

## 2023-05-30 DIAGNOSIS — E119 Type 2 diabetes mellitus without complications: Secondary | ICD-10-CM

## 2023-05-30 DIAGNOSIS — E785 Hyperlipidemia, unspecified: Secondary | ICD-10-CM | POA: Diagnosis present

## 2023-05-30 DIAGNOSIS — N289 Disorder of kidney and ureter, unspecified: Secondary | ICD-10-CM

## 2023-05-30 DIAGNOSIS — E0781 Sick-euthyroid syndrome: Secondary | ICD-10-CM | POA: Diagnosis present

## 2023-05-30 DIAGNOSIS — N390 Urinary tract infection, site not specified: Secondary | ICD-10-CM | POA: Diagnosis present

## 2023-05-30 DIAGNOSIS — S7002XA Contusion of left hip, initial encounter: Secondary | ICD-10-CM | POA: Diagnosis present

## 2023-05-30 DIAGNOSIS — I480 Paroxysmal atrial fibrillation: Secondary | ICD-10-CM | POA: Diagnosis present

## 2023-05-30 DIAGNOSIS — Z79899 Other long term (current) drug therapy: Secondary | ICD-10-CM

## 2023-05-30 DIAGNOSIS — F32A Depression, unspecified: Secondary | ICD-10-CM | POA: Diagnosis present

## 2023-05-30 DIAGNOSIS — Z833 Family history of diabetes mellitus: Secondary | ICD-10-CM

## 2023-05-30 DIAGNOSIS — Z716 Tobacco abuse counseling: Secondary | ICD-10-CM

## 2023-05-30 DIAGNOSIS — Z85118 Personal history of other malignant neoplasm of bronchus and lung: Secondary | ICD-10-CM

## 2023-05-30 DIAGNOSIS — K219 Gastro-esophageal reflux disease without esophagitis: Secondary | ICD-10-CM | POA: Diagnosis present

## 2023-05-30 DIAGNOSIS — J9621 Acute and chronic respiratory failure with hypoxia: Secondary | ICD-10-CM

## 2023-05-30 DIAGNOSIS — I10 Essential (primary) hypertension: Secondary | ICD-10-CM | POA: Diagnosis present

## 2023-05-30 DIAGNOSIS — Z7901 Long term (current) use of anticoagulants: Secondary | ICD-10-CM

## 2023-05-30 DIAGNOSIS — Z1152 Encounter for screening for COVID-19: Secondary | ICD-10-CM

## 2023-05-30 DIAGNOSIS — I5033 Acute on chronic diastolic (congestive) heart failure: Secondary | ICD-10-CM | POA: Diagnosis present

## 2023-05-30 LAB — CBC
HCT: 40.2 % (ref 36.0–46.0)
Hemoglobin: 13.5 g/dL (ref 12.0–15.0)
MCH: 33.1 pg (ref 26.0–34.0)
MCHC: 33.6 g/dL (ref 30.0–36.0)
MCV: 98.5 fL (ref 80.0–100.0)
Platelets: 263 10*3/uL (ref 150–400)
RBC: 4.08 MIL/uL (ref 3.87–5.11)
RDW: 14.4 % (ref 11.5–15.5)
WBC: 11.1 10*3/uL — ABNORMAL HIGH (ref 4.0–10.5)
nRBC: 0 % (ref 0.0–0.2)

## 2023-05-30 LAB — BASIC METABOLIC PANEL
Anion gap: 18 — ABNORMAL HIGH (ref 5–15)
BUN: 25 mg/dL — ABNORMAL HIGH (ref 8–23)
CO2: 26 mmol/L (ref 22–32)
Calcium: 9.2 mg/dL (ref 8.9–10.3)
Chloride: 93 mmol/L — ABNORMAL LOW (ref 98–111)
Creatinine, Ser: 1.2 mg/dL — ABNORMAL HIGH (ref 0.44–1.00)
GFR, Estimated: 49 mL/min — ABNORMAL LOW (ref >60)
Glucose, Bld: 207 mg/dL — ABNORMAL HIGH (ref 70–99)
Potassium: 3.9 mmol/L (ref 3.5–5.1)
Sodium: 137 mmol/L (ref 135–145)

## 2023-05-30 LAB — TROPONIN I (HIGH SENSITIVITY)
Troponin I (High Sensitivity): 5 ng/L (ref <18)
Troponin I (High Sensitivity): 4 ng/L (ref <18)

## 2023-05-30 MED ORDER — ONDANSETRON 4 MG PO TBDP
8.0000 mg | ORAL_TABLET | Freq: Once | ORAL | Status: AC
Start: 2023-05-30 — End: 2023-05-30
  Administered 2023-05-30: 8 mg via ORAL
  Filled 2023-05-30: qty 2

## 2023-05-30 NOTE — ED Notes (Signed)
 Pa notified of the pt

## 2023-05-30 NOTE — ED Notes (Signed)
 Pt's O2 was 88-89% on RA. Pt was placed on 2L [place]. Will continue to monitor.

## 2023-05-30 NOTE — ED Triage Notes (Signed)
 The pt was brought down from the waiting room upstairs where she has been since 0430 this am  her son was hjaving surgery today   she is hard to get a history from  chest pain since this afternoon sometime she is difficult to get a history from

## 2023-05-30 NOTE — ED Provider Triage Note (Signed)
 Emergency Medicine Provider Triage Evaluation Note  Holly May , a 73 y.o. female  was evaluated in triage.  Pt complains of chest pain that occurred while waiting for son to get out of surgery. Chest pain has been intermittent for several months. No current chest pain. Reports pain is achey.  Review of Systems  Positive: Chest pain Negative: SOB  Physical Exam  BP (!) 189/90 (BP Location: Left Arm)   Pulse 88   Temp 98.6 F (37 C)   Resp 20   Ht 5\' 7"  (1.702 m)   Wt 64 kg   SpO2 92%   BMI 22.10 kg/m  Gen:   Awake, no distress   Resp:  Normal effort  MSK:   Moves extremities without difficulty  Other:    Medical Decision Making  Medically screening exam initiated at 8:35 PM.  Appropriate orders placed.  Holly May was informed that the remainder of the evaluation will be completed by another provider, this initial triage assessment does not replace that evaluation, and the importance of remaining in the ED until their evaluation is complete.    Pete Pelt, [place] 05/30/23 2036

## 2023-05-31 ENCOUNTER — Inpatient Hospital Stay (HOSPITAL_COMMUNITY)

## 2023-05-31 ENCOUNTER — Emergency Department (HOSPITAL_COMMUNITY)

## 2023-05-31 DIAGNOSIS — Z85118 Personal history of other malignant neoplasm of bronchus and lung: Secondary | ICD-10-CM | POA: Diagnosis not present

## 2023-05-31 DIAGNOSIS — I4891 Unspecified atrial fibrillation: Secondary | ICD-10-CM | POA: Diagnosis not present

## 2023-05-31 DIAGNOSIS — F17210 Nicotine dependence, cigarettes, uncomplicated: Secondary | ICD-10-CM | POA: Diagnosis present

## 2023-05-31 DIAGNOSIS — I480 Paroxysmal atrial fibrillation: Secondary | ICD-10-CM | POA: Diagnosis present

## 2023-05-31 DIAGNOSIS — Z833 Family history of diabetes mellitus: Secondary | ICD-10-CM | POA: Diagnosis not present

## 2023-05-31 DIAGNOSIS — J189 Pneumonia, unspecified organism: Secondary | ICD-10-CM | POA: Diagnosis present

## 2023-05-31 DIAGNOSIS — J9621 Acute and chronic respiratory failure with hypoxia: Secondary | ICD-10-CM | POA: Diagnosis present

## 2023-05-31 DIAGNOSIS — J9601 Acute respiratory failure with hypoxia: Secondary | ICD-10-CM

## 2023-05-31 DIAGNOSIS — M25552 Pain in left hip: Secondary | ICD-10-CM

## 2023-05-31 DIAGNOSIS — Z7901 Long term (current) use of anticoagulants: Secondary | ICD-10-CM | POA: Diagnosis not present

## 2023-05-31 DIAGNOSIS — E1165 Type 2 diabetes mellitus with hyperglycemia: Secondary | ICD-10-CM

## 2023-05-31 DIAGNOSIS — E876 Hypokalemia: Secondary | ICD-10-CM | POA: Diagnosis present

## 2023-05-31 DIAGNOSIS — E785 Hyperlipidemia, unspecified: Secondary | ICD-10-CM | POA: Diagnosis present

## 2023-05-31 DIAGNOSIS — W19XXXA Unspecified fall, initial encounter: Secondary | ICD-10-CM | POA: Diagnosis present

## 2023-05-31 DIAGNOSIS — J441 Chronic obstructive pulmonary disease with (acute) exacerbation: Secondary | ICD-10-CM | POA: Diagnosis present

## 2023-05-31 DIAGNOSIS — K219 Gastro-esophageal reflux disease without esophagitis: Secondary | ICD-10-CM

## 2023-05-31 DIAGNOSIS — Y92009 Unspecified place in unspecified non-institutional (private) residence as the place of occurrence of the external cause: Secondary | ICD-10-CM

## 2023-05-31 DIAGNOSIS — A419 Sepsis, unspecified organism: Secondary | ICD-10-CM | POA: Diagnosis present

## 2023-05-31 DIAGNOSIS — Z7983 Long term (current) use of bisphosphonates: Secondary | ICD-10-CM | POA: Diagnosis not present

## 2023-05-31 DIAGNOSIS — N179 Acute kidney failure, unspecified: Secondary | ICD-10-CM | POA: Diagnosis present

## 2023-05-31 DIAGNOSIS — J440 Chronic obstructive pulmonary disease with acute lower respiratory infection: Secondary | ICD-10-CM | POA: Diagnosis present

## 2023-05-31 DIAGNOSIS — N390 Urinary tract infection, site not specified: Secondary | ICD-10-CM | POA: Diagnosis present

## 2023-05-31 DIAGNOSIS — E872 Acidosis, unspecified: Secondary | ICD-10-CM | POA: Diagnosis present

## 2023-05-31 DIAGNOSIS — I5033 Acute on chronic diastolic (congestive) heart failure: Secondary | ICD-10-CM | POA: Diagnosis present

## 2023-05-31 DIAGNOSIS — N289 Disorder of kidney and ureter, unspecified: Secondary | ICD-10-CM

## 2023-05-31 DIAGNOSIS — Z923 Personal history of irradiation: Secondary | ICD-10-CM | POA: Diagnosis not present

## 2023-05-31 DIAGNOSIS — E119 Type 2 diabetes mellitus without complications: Secondary | ICD-10-CM | POA: Diagnosis present

## 2023-05-31 DIAGNOSIS — I110 Hypertensive heart disease with heart failure: Secondary | ICD-10-CM | POA: Diagnosis present

## 2023-05-31 DIAGNOSIS — Z8249 Family history of ischemic heart disease and other diseases of the circulatory system: Secondary | ICD-10-CM | POA: Diagnosis not present

## 2023-05-31 DIAGNOSIS — Z1152 Encounter for screening for COVID-19: Secondary | ICD-10-CM | POA: Diagnosis not present

## 2023-05-31 DIAGNOSIS — R9431 Abnormal electrocardiogram [ECG] [EKG]: Secondary | ICD-10-CM

## 2023-05-31 DIAGNOSIS — F32A Depression, unspecified: Secondary | ICD-10-CM | POA: Diagnosis present

## 2023-05-31 DIAGNOSIS — I10 Essential (primary) hypertension: Secondary | ICD-10-CM

## 2023-05-31 LAB — RESP PANEL BY RT-PCR (RSV, FLU A&B, COVID)  RVPGX2
Influenza A by PCR: NEGATIVE
Influenza B by PCR: NEGATIVE
Resp Syncytial Virus by PCR: NEGATIVE
SARS Coronavirus 2 by RT PCR: NEGATIVE

## 2023-05-31 LAB — URINALYSIS, W/ REFLEX TO CULTURE (INFECTION SUSPECTED)
Bilirubin Urine: NEGATIVE
Glucose, UA: NEGATIVE mg/dL
Ketones, ur: NEGATIVE mg/dL
Leukocytes,Ua: NEGATIVE
Nitrite: POSITIVE — AB
Protein, ur: >=300 mg/dL — AB
Specific Gravity, Urine: 1.033 — ABNORMAL HIGH (ref 1.005–1.030)
pH: 6 (ref 5.0–8.0)

## 2023-05-31 LAB — HEMOGLOBIN A1C
Hgb A1c MFr Bld: 6 % — ABNORMAL HIGH (ref 4.8–5.6)
Mean Plasma Glucose: 125.5 mg/dL

## 2023-05-31 LAB — PROCALCITONIN: Procalcitonin: 0.17 ng/mL

## 2023-05-31 LAB — I-STAT CG4 LACTIC ACID, ED
Lactic Acid, Venous: 1.8 mmol/L (ref 0.5–1.9)
Lactic Acid, Venous: 3.7 mmol/L (ref 0.5–1.9)

## 2023-05-31 LAB — BRAIN NATRIURETIC PEPTIDE: B Natriuretic Peptide: 283.6 pg/mL — ABNORMAL HIGH (ref 0.0–100.0)

## 2023-05-31 LAB — GLUCOSE, CAPILLARY
Glucose-Capillary: 92 mg/dL (ref 70–99)
Glucose-Capillary: 225 mg/dL — ABNORMAL HIGH (ref 70–99)

## 2023-05-31 MED ORDER — APIXABAN 5 MG PO TABS
5.0000 mg | ORAL_TABLET | Freq: Two times a day (BID) | ORAL | Status: DC
Start: 2023-05-31 — End: 2023-06-06
  Administered 2023-05-31 – 2023-06-06 (×13): 5 mg via ORAL
  Filled 2023-05-31 (×13): qty 1

## 2023-05-31 MED ORDER — ALBUTEROL SULFATE (2.5 MG/3ML) 0.083% IN NEBU
2.5000 mg | INHALATION_SOLUTION | Freq: Once | RESPIRATORY_TRACT | Status: AC
  Administered 2023-05-31: 2.5 mg via RESPIRATORY_TRACT
  Filled 2023-05-31: qty 3

## 2023-05-31 MED ORDER — INSULIN ASPART 100 UNIT/ML IJ SOLN
0.0000 [IU] | Freq: Three times a day (TID) | INTRAMUSCULAR | Status: DC
  Administered 2023-06-01: 2 [IU] via SUBCUTANEOUS
  Administered 2023-06-01: 5 [IU] via SUBCUTANEOUS
  Administered 2023-06-01 – 2023-06-02 (×3): 2 [IU] via SUBCUTANEOUS
  Administered 2023-06-03: 3 [IU] via SUBCUTANEOUS
  Administered 2023-06-03: 1 [IU] via SUBCUTANEOUS
  Administered 2023-06-03: 3 [IU] via SUBCUTANEOUS
  Administered 2023-06-04: 2 [IU] via SUBCUTANEOUS
  Administered 2023-06-04: 3 [IU] via SUBCUTANEOUS
  Administered 2023-06-04: 2 [IU] via SUBCUTANEOUS
  Administered 2023-06-05 – 2023-06-06 (×4): 3 [IU] via SUBCUTANEOUS

## 2023-05-31 MED ORDER — GUAIFENESIN ER 600 MG PO TB12
600.0000 mg | ORAL_TABLET | Freq: Two times a day (BID) | ORAL | Status: DC
  Administered 2023-05-31 – 2023-06-06 (×13): 600 mg via ORAL
  Filled 2023-05-31 (×13): qty 1

## 2023-05-31 MED ORDER — GABAPENTIN 300 MG PO CAPS
300.0000 mg | ORAL_CAPSULE | Freq: Every day | ORAL | Status: DC
  Administered 2023-05-31 – 2023-06-06 (×7): 300 mg via ORAL
  Filled 2023-05-31 (×7): qty 1

## 2023-05-31 MED ORDER — LACTATED RINGERS IV BOLUS
1000.0000 mL | Freq: Once | INTRAVENOUS | Status: AC
  Administered 2023-05-31: 1000 mL via INTRAVENOUS

## 2023-05-31 MED ORDER — NICOTINE 21 MG/24HR TD PT24
21.0000 mg | MEDICATED_PATCH | Freq: Every day | TRANSDERMAL | Status: DC
  Filled 2023-05-31 (×3): qty 1

## 2023-05-31 MED ORDER — PRAVASTATIN SODIUM 40 MG PO TABS
40.0000 mg | ORAL_TABLET | Freq: Every day | ORAL | Status: DC
  Administered 2023-05-31 – 2023-06-05 (×6): 40 mg via ORAL
  Filled 2023-05-31 (×2): qty 1
  Filled 2023-05-31: qty 4
  Filled 2023-05-31 (×3): qty 1

## 2023-05-31 MED ORDER — SODIUM CHLORIDE 0.9 % IV SOLN
100.0000 mg | Freq: Two times a day (BID) | INTRAVENOUS | Status: AC
  Administered 2023-05-31 – 2023-06-04 (×10): 100 mg via INTRAVENOUS
  Filled 2023-05-31 (×10): qty 100

## 2023-05-31 MED ORDER — IPRATROPIUM BROMIDE 0.02 % IN SOLN
0.5000 mg | Freq: Four times a day (QID) | RESPIRATORY_TRACT | Status: DC
  Administered 2023-05-31 – 2023-06-01 (×3): 0.5 mg via RESPIRATORY_TRACT
  Filled 2023-05-31 (×3): qty 2.5

## 2023-05-31 MED ORDER — IOHEXOL 350 MG/ML SOLN
75.0000 mL | Freq: Once | INTRAVENOUS | Status: AC | PRN
  Administered 2023-05-31: 75 mL via INTRAVENOUS

## 2023-05-31 MED ORDER — DILTIAZEM LOAD VIA INFUSION
10.0000 mg | Freq: Once | INTRAVENOUS | Status: AC
  Administered 2023-05-31: 10 mg via INTRAVENOUS
  Filled 2023-05-31: qty 10

## 2023-05-31 MED ORDER — LACTATED RINGERS IV BOLUS (SEPSIS)
1000.0000 mL | Freq: Once | INTRAVENOUS | Status: AC
  Administered 2023-05-31: 1000 mL via INTRAVENOUS

## 2023-05-31 MED ORDER — PANTOPRAZOLE SODIUM 40 MG PO TBEC
40.0000 mg | DELAYED_RELEASE_TABLET | Freq: Every day | ORAL | Status: DC
  Administered 2023-05-31 – 2023-06-06 (×7): 40 mg via ORAL
  Filled 2023-05-31 (×7): qty 1

## 2023-05-31 MED ORDER — AMLODIPINE BESYLATE 5 MG PO TABS
5.0000 mg | ORAL_TABLET | Freq: Every day | ORAL | Status: DC
  Administered 2023-05-31: 5 mg via ORAL
  Filled 2023-05-31: qty 1

## 2023-05-31 MED ORDER — OXYBUTYNIN CHLORIDE 5 MG PO TABS
5.0000 mg | ORAL_TABLET | Freq: Two times a day (BID) | ORAL | Status: DC
  Administered 2023-05-31 – 2023-06-06 (×13): 5 mg via ORAL
  Filled 2023-05-31 (×13): qty 1

## 2023-05-31 MED ORDER — BISOPROLOL FUMARATE 5 MG PO TABS
5.0000 mg | ORAL_TABLET | Freq: Every day | ORAL | Status: DC
  Administered 2023-05-31 – 2023-06-01 (×2): 5 mg via ORAL
  Filled 2023-05-31 (×3): qty 1

## 2023-05-31 MED ORDER — LEVALBUTEROL HCL 0.63 MG/3ML IN NEBU
0.6300 mg | INHALATION_SOLUTION | Freq: Four times a day (QID) | RESPIRATORY_TRACT | Status: DC
  Administered 2023-05-31 – 2023-06-01 (×3): 0.63 mg via RESPIRATORY_TRACT
  Filled 2023-05-31 (×3): qty 3

## 2023-05-31 MED ORDER — INSULIN ASPART 100 UNIT/ML IJ SOLN: 0.0000 [IU] | Freq: Three times a day (TID) | INTRAMUSCULAR | Status: DC

## 2023-05-31 MED ORDER — BUDESONIDE 0.5 MG/2ML IN SUSP
0.5000 mg | Freq: Two times a day (BID) | RESPIRATORY_TRACT | Status: DC
  Administered 2023-05-31 – 2023-06-06 (×13): 0.5 mg via RESPIRATORY_TRACT
  Filled 2023-05-31 (×13): qty 2

## 2023-05-31 MED ORDER — DILTIAZEM HCL-DEXTROSE 125-5 MG/125ML-% IV SOLN (PREMIX)
5.0000 mg/h | INTRAVENOUS | Status: DC
  Administered 2023-05-31: 5 mg/h via INTRAVENOUS
  Filled 2023-05-31: qty 125

## 2023-05-31 MED ORDER — SODIUM CHLORIDE 0.9 % IV SOLN
500.0000 mg | Freq: Once | INTRAVENOUS | Status: AC
  Administered 2023-05-31: 500 mg via INTRAVENOUS
  Filled 2023-05-31: qty 5

## 2023-05-31 MED ORDER — ACETAMINOPHEN 325 MG PO TABS
ORAL_TABLET | ORAL | Status: AC
Start: 2023-05-31 — End: 2023-05-31
  Filled 2023-05-31: qty 2

## 2023-05-31 MED ORDER — SODIUM CHLORIDE 0.9% FLUSH
3.0000 mL | Freq: Two times a day (BID) | INTRAVENOUS | Status: DC
  Administered 2023-05-31 – 2023-06-06 (×12): 3 mL via INTRAVENOUS

## 2023-05-31 MED ORDER — IPRATROPIUM-ALBUTEROL 0.5-2.5 (3) MG/3ML IN SOLN
3.0000 mL | Freq: Once | RESPIRATORY_TRACT | Status: AC
  Administered 2023-05-31: 3 mL via RESPIRATORY_TRACT
  Filled 2023-05-31: qty 3

## 2023-05-31 MED ORDER — PREDNISONE 20 MG PO TABS
40.0000 mg | ORAL_TABLET | Freq: Every day | ORAL | Status: DC
  Administered 2023-05-31 – 2023-06-02 (×3): 40 mg via ORAL
  Filled 2023-05-31 (×3): qty 2

## 2023-05-31 MED ORDER — ARFORMOTEROL TARTRATE 15 MCG/2ML IN NEBU
15.0000 ug | INHALATION_SOLUTION | Freq: Two times a day (BID) | RESPIRATORY_TRACT | Status: DC
  Administered 2023-05-31 – 2023-06-06 (×13): 15 ug via RESPIRATORY_TRACT
  Filled 2023-05-31 (×13): qty 2

## 2023-05-31 MED ORDER — IPRATROPIUM-ALBUTEROL 0.5-2.5 (3) MG/3ML IN SOLN
3.0000 mL | Freq: Four times a day (QID) | RESPIRATORY_TRACT | Status: DC
  Administered 2023-05-31 (×3): 3 mL via RESPIRATORY_TRACT
  Filled 2023-05-31 (×4): qty 3

## 2023-05-31 MED ORDER — ACETAMINOPHEN 325 MG PO TABS
650.0000 mg | ORAL_TABLET | Freq: Four times a day (QID) | ORAL | Status: DC | PRN
  Administered 2023-05-31 – 2023-06-03 (×2): 650 mg via ORAL
  Filled 2023-05-31 (×2): qty 2

## 2023-05-31 MED ORDER — LACTATED RINGERS IV SOLN: INTRAVENOUS | Status: AC

## 2023-05-31 MED ORDER — SODIUM CHLORIDE 0.9 % IV SOLN
2.0000 g | INTRAVENOUS | Status: AC
  Administered 2023-05-31 – 2023-06-04 (×5): 2 g via INTRAVENOUS
  Filled 2023-05-31 (×5): qty 20

## 2023-05-31 MED ORDER — INSULIN ASPART 100 UNIT/ML IJ SOLN
0.0000 [IU] | Freq: Every day | INTRAMUSCULAR | Status: DC
  Administered 2023-05-31 – 2023-06-02 (×2): 2 [IU] via SUBCUTANEOUS
  Administered 2023-06-03: 3 [IU] via SUBCUTANEOUS
  Administered 2023-06-04: 4 [IU] via SUBCUTANEOUS
  Administered 2023-06-05: 5 [IU] via SUBCUTANEOUS

## 2023-05-31 MED ORDER — ACETAMINOPHEN 650 MG RE SUPP: 650.0000 mg | Freq: Four times a day (QID) | RECTAL | Status: DC | PRN

## 2023-05-31 MED ORDER — TRIMETHOBENZAMIDE HCL 100 MG/ML IM SOLN
200.0000 mg | Freq: Four times a day (QID) | INTRAMUSCULAR | Status: DC | PRN
  Filled 2023-05-31: qty 2

## 2023-05-31 MED ORDER — SODIUM CHLORIDE 0.9 % IV SOLN
2.0000 g | Freq: Once | INTRAVENOUS | Status: AC
  Administered 2023-05-31: 2 g via INTRAVENOUS
  Filled 2023-05-31: qty 20

## 2023-05-31 MED ORDER — ACETAMINOPHEN 325 MG PO TABS
650.0000 mg | ORAL_TABLET | Freq: Once | ORAL | Status: AC
  Administered 2023-05-31: 650 mg via ORAL

## 2023-05-31 NOTE — ED Notes (Signed)
 Back from CT

## 2023-05-31 NOTE — Progress Notes (Signed)
   05/31/23 1937  Vitals  Temp 100.3 F (37.9 C)  Temp Source Oral  BP (!) 164/114  MAP (mmHg) 110  BP Location Left Arm  BP Method Automatic  Patient Position (if appropriate) Lying  Pulse Rate (!) 134  Pulse Rate Source Monitor  ECG Heart Rate (!) 141  Resp (!) 21  Level of Consciousness  Level of Consciousness Alert  MEWS COLOR  MEWS Score Color Red  Oxygen Therapy  SpO2 100 %  O2 Device Nasal Cannula  O2 Flow Rate (L/min) 8 L/min  MEWS Score  MEWS Temp 0  MEWS Systolic 0  MEWS Pulse 3  MEWS RR 1  MEWS LOC 0  MEWS Score 4  Provider Notification  Provider Name/Title night team RT; night MD  Date Provider Notified 05/31/23  Time Provider Notified 1940  Method of Notification Call  Notification Reason Other (Comment) (patient is having difficulty breathing, tachy, high bp. come to patient room. on nonrebreather now; red mews)  Provider response En route  Date of Provider Response 05/31/23  Time of Provider Response 1941

## 2023-05-31 NOTE — Sepsis Progress Note (Signed)
 Following for sepsis monitoring ?

## 2023-05-31 NOTE — ED Provider Notes (Signed)
 [place] EMERGENCY DEPARTMENT AT [facility] Provider Note   CSN: [id number] Arrival date & time: 05/30/23  1921     History  Chief Complaint  Patient presents with   Chest Pain    Holly May is a 73 y.o. female.  Patient to the emergency department for evaluation of chest pain.  Patient reports that she has a history of COPD.  She has been feeling short of breath.  She reports that she has been using her breathing treatments without improvement.  She was feeling discomfort and tightness in her chest with her breathing prior to coming to the ED, since being put on oxygen discomfort has resolved.       Home Medications Prior to Admission medications   Medication Sig Start Date End Date Taking? Authorizing Provider  albuterol (PROVENTIL HFA;VENTOLIN HFA) 108 (90 Base) MCG/ACT inhaler Inhale 2 puffs into the lungs every 4 (four) hours as needed for wheezing or shortness of breath (cough, shortness of breath or wheezing.). Patient taking differently: Inhale 2 puffs into the lungs every 4 (four) hours as needed for wheezing or shortness of breath (cough). 02/18/18   Sherryll Burger, Pratik D, DO  alendronate (FOSAMAX) 70 MG tablet Take 70 mg by mouth once a week. Wednesday 06/17/22   [provider]  amLODipine (NORVASC) 5 MG tablet Take 5 mg by mouth daily.    [provider]  apixaban (ELIQUIS) 5 MG TABS tablet Take 1 tablet (5 mg total) by mouth 2 (two) times daily. 05/01/23   Mallipeddi, Vishnu P, MD  bisoprolol (ZEBETA) 5 MG tablet Take 1 tablet (5 mg total) by mouth daily. 05/01/23   Mallipeddi, Vishnu P, MD  citalopram (CELEXA) 20 MG tablet Take 1 tablet (20 mg total) by mouth daily. 06/14/16   Johnson, Clanford L, MD  gabapentin (NEURONTIN) 300 MG capsule Take 300 mg by mouth daily.    [provider]  ipratropium-albuterol (DUONEB) 0.5-2.5 (3) MG/3ML SOLN Take 3 mLs by nebulization every 6 (six) hours as needed (wheezing, shortness of breath). 06/11/22    [provider]  lovastatin (MEVACOR) 40 MG tablet Take 40 mg by mouth daily.    [provider]  mirtazapine (REMERON) 15 MG tablet Take 15 mg by mouth at bedtime.    [provider]  olmesartan (BENICAR) 40 MG tablet Take 1 tablet (40 mg total) by mouth daily. 02/11/23 02/11/24  Nyoka Cowden, MD  omeprazole (PRILOSEC) 20 MG capsule Take 20 mg by mouth daily.    [provider]  oxybutynin (DITROPAN) 5 MG tablet Take 5 mg by mouth 3 (three) times daily.     [provider]  predniSONE (DELTASONE) 10 MG tablet 2 daily with breakfast until better then 1 daily x  1 week and stop 02/11/23   Nyoka Cowden, MD  TRELEGY ELLIPTA 100-62.5-25 MCG/ACT AEPB Inhale 1 puff into the lungs daily.    [provider]      Allergies    Patient has no known allergies.    Review of Systems   Review of Systems  Physical Exam Updated Vital Signs BP (!) 174/85   Pulse 91   Temp (!) 102 F (38.9 C) (Rectal)   Resp 19   Ht 5\' 7"  (1.702 m)   Wt 64 kg   SpO2 92%   BMI 22.10 kg/m  Physical Exam Vitals and nursing note reviewed.  Constitutional:      General: She is not in acute distress.  Appearance: She is well-developed.  HENT:     Head: Normocephalic and atraumatic.     Mouth/Throat:     Mouth: Mucous membranes are moist.  Eyes:     General: Vision grossly intact. Gaze aligned appropriately.     Extraocular Movements: Extraocular movements intact.     Conjunctiva/sclera: Conjunctivae normal.  Cardiovascular:     Rate and Rhythm: Normal rate and regular rhythm.     Pulses: Normal pulses.     Heart sounds: Normal heart sounds, S1 normal and S2 normal. No murmur heard.    No friction rub. No gallop.  Pulmonary:     Effort: Accessory muscle usage and prolonged expiration present. No respiratory distress.     Breath sounds: Decreased air movement present. Decreased breath sounds and wheezing present.  Abdominal:     General: Bowel  sounds are normal.     Palpations: Abdomen is soft.     Tenderness: There is no abdominal tenderness. There is no guarding or rebound.     Hernia: No hernia is present.  Musculoskeletal:        General: No swelling.     Cervical back: Full passive range of motion without pain, normal range of motion and neck supple. No spinous process tenderness or muscular tenderness. Normal range of motion.     Right lower leg: No edema.     Left lower leg: No edema.  Skin:    General: Skin is warm and dry.     Capillary Refill: Capillary refill takes less than 2 seconds.     Findings: No ecchymosis, erythema, rash or wound.  Neurological:     General: No focal deficit present.     Mental Status: She is alert and oriented to person, place, and time.     GCS: GCS eye subscore is 4. GCS verbal subscore is 5. GCS motor subscore is 6.     Cranial Nerves: Cranial nerves 2-12 are intact.     Sensory: Sensation is intact.     Motor: Motor function is intact.     Coordination: Coordination is intact.  Psychiatric:        Attention and Perception: Attention normal.        Mood and Affect: Mood normal.        Speech: Speech normal.        Behavior: Behavior normal.     ED Results / Procedures / Treatments   Labs (all labs ordered are listed, but only abnormal results are displayed) Labs Reviewed  BASIC METABOLIC PANEL - Abnormal; Notable for the following components:      Result Value   Chloride 93 (*)    Glucose, Bld 207 (*)    BUN 25 (*)    Creatinine, Ser 1.20 (*)    GFR, Estimated 49 (*)    Anion gap 18 (*)    All other components within normal limits  CBC - Abnormal; Notable for the following components:   WBC 11.1 (*)    All other components within normal limits  BRAIN NATRIURETIC PEPTIDE - Abnormal; Notable for the following components:   B Natriuretic Peptide 283.6 (*)    All other components within normal limits  URINALYSIS, W/ REFLEX TO CULTURE (INFECTION SUSPECTED) - Abnormal;  Notable for the following components:   Specific Gravity, Urine 1.033 (*)    Hgb urine dipstick SMALL (*)    Protein, ur >=300 (*)    Nitrite POSITIVE (*)    Bacteria, UA MANY (*)  All other components within normal limits  I-STAT CG4 LACTIC ACID, ED - Abnormal; Notable for the following components:   Lactic Acid, Venous 3.7 (*)    All other components within normal limits  RESP PANEL BY RT-PCR (RSV, FLU A&B, COVID)  RVPGX2  CULTURE, BLOOD (ROUTINE X 2)  CULTURE, BLOOD (ROUTINE X 2)  URINE CULTURE  I-STAT CG4 LACTIC ACID, ED  TROPONIN I (HIGH SENSITIVITY)  TROPONIN I (HIGH SENSITIVITY)    EKG EKG Interpretation Date/Time:  Friday May 30 2023 19:34:12 EST Ventricular Rate:  86 PR Interval:  144 QRS Duration:  74 QT Interval:  428 QTC Calculation: 512 R Axis:   -1  Text Interpretation: Sinus rhythm with Premature atrial complexes Minimal voltage criteria for LVH, may be normal variant ( R in aVL ) ST & T wave abnormality, consider lateral ischemia Abnormal ECG No significant change since last tracing Confirmed by Gilda Crease [phone]) on 05/30/2023 11:05:22 PM  Radiology CT Angio Chest Pulmonary Embolism (PE) W or WO Contrast Result Date: 05/31/2023 CLINICAL DATA:  Chest pain and difficulty breathing EXAM: CT ANGIOGRAPHY CHEST WITH CONTRAST TECHNIQUE: Multidetector CT imaging of the chest was performed using the standard protocol during bolus administration of intravenous contrast. Multiplanar CT image reconstructions and MIPs were obtained to evaluate the vascular anatomy. RADIATION DOSE REDUCTION: This exam was performed according to the departmental dose-optimization program which includes automated exposure control, adjustment of the mA and/or kV according to patient size and/or use of iterative reconstruction technique. CONTRAST:  75mL OMNIPAQUE IOHEXOL 350 MG/ML SOLN COMPARISON:  Chest x-ray from the previous day, CT from 12/30/2022 FINDINGS: Cardiovascular:  Atherosclerotic calcifications of the thoracic aorta are noted. No aneurysmal dilatation is noted. The degree of opacification is limited precluding evaluation for dissection. Heart is at the upper limits of normal in size. The pulmonary artery shows a normal branching pattern bilaterally. No intraluminal filling defect is identified to suggest pulmonary embolism. Mediastinum/Nodes: Thoracic inlet is within normal limits. No hilar or mediastinal adenopathy is noted. The esophagus as visualized is within normal limits. Lungs/Pleura: Lungs demonstrate some emphysematous changes. Persistent spiculated nodule is noted in the left upper lobe with some tenting of the adjacent fissure stable in appearance. Fiducial markers are noted. No new spiculated nodular density is seen. Patchy ground-glass opacities are noted in the right upper lobe likely postinflammatory in nature. These were not seen on most recent exam. Atelectatic changes are noted in the right lower lobe related to significant mucous plugging of the right lower lobe. A nodular density is noted in the right lower lobe with air bronchograms within. This measures 15 mm in greatest dimension and is new from the prior exam best visualized on image number 100 of series 6. Portion of these changes may be inflammatory although the possibility of underlying neoplasm deserves consideration well. No sizable effusion is seen. Upper Abdomen: Visualized upper abdomen is unremarkable. Musculoskeletal: Degenerative changes of the thoracic spine are seen. No acute rib abnormality is noted. Chronic compression deformities in the lower thoracic spine are noted. Review of the MIP images confirms the above findings. IMPRESSION: Stable left upper lobe spiculated nodule consistent with the given clinical history. Nodular density in the right lower lobe measuring up to 15 mm. This is new from the prior exam but given the adjacent mucous plugging and inflammatory change this may be  inflammatory in nature. Short-term follow-up is recommended. Neoplasm is not excluded on the basis of this exam. Small patchy ground-glass opacities in the  right upper lobe likely postinflammatory in nature. No evidence of pulmonary embolism. Aortic Atherosclerosis (ICD10-I70.0) and Emphysema (ICD10-J43.9). Electronically Signed   By: Alcide Clever M.D.   On: 05/31/2023 02:26   DG Chest 2 View Result Date: 05/30/2023 CLINICAL DATA:  Chest pain EXAM: CHEST - 2 VIEW COMPARISON:  CT from 12/30/2022 FINDINGS: Cardiac shadow is stable. Fiducial marker is noted in the left upper adjacent to a linear area of density corresponding to prior nodule. The nodule is less well visualized on this exam. Multiple rib fractures with healing are noted on the right similar to that seen on prior CT. No focal infiltrate or sizable effusion is seen. Multilevel compression deformities are noted in the thoracic spine stable from previous exam. IMPRESSION: No acute abnormality is noted. Chronic changes as described above. Electronically Signed   By: Alcide Clever M.D.   On: 05/30/2023 20:52    Procedures Procedures    Medications Ordered in ED Medications  lactated ringers infusion (has no administration in time range)  acetaminophen (TYLENOL) 325 MG tablet (has no administration in time range)  ondansetron (ZOFRAN-ODT) disintegrating tablet 8 mg (8 mg Oral Given 05/30/23 1942)  lactated ringers bolus 1,000 mL (0 mLs Intravenous Stopped 05/31/23 0141)  cefTRIAXone (ROCEPHIN) 2 g in sodium chloride 0.9 % 100 mL IVPB (0 g Intravenous Stopped 05/31/23 0141)  azithromycin (ZITHROMAX) 500 mg in sodium chloride 0.9 % 250 mL IVPB (500 mg Intravenous New Bag/Given 05/31/23 0200)  lactated ringers bolus 1,000 mL (1,000 mLs Intravenous New Bag/Given 05/31/23 0155)  iohexol (OMNIPAQUE) 350 MG/ML injection 75 mL (75 mLs Intravenous Contrast Given 05/31/23 0151)  ipratropium-albuterol (DUONEB) 0.5-2.5 (3) MG/3ML nebulizer solution 3 mL (3 mLs  Nebulization Given 05/31/23 0248)  albuterol (PROVENTIL) (2.5 MG/3ML) 0.083% nebulizer solution 2.5 mg (2.5 mg Nebulization Given 05/31/23 0248)  acetaminophen (TYLENOL) tablet 650 mg (650 mg Oral Given 05/31/23 0244)    ED Course/ Medical Decision Making/ A&P                                 Medical Decision Making Amount and/or Complexity of Data Reviewed Labs: ordered. Radiology: ordered.  Risk OTC drugs. Prescription drug management.   Differential Diagnosis considered includes, but not limited to: Differential Diagnosis considered includes, but not limited to: COPD exacerbation; Bronchitis; Pneumonia; CHF; ACS; PE  Patient presents with chest pain which seems like it was more discomfort with breathing secondary to feeling short of breath.  Discomfort resolved when she was placed on supplemental oxygen for room air oxygen saturations of 88 to 89%.  She does not use oxygen at home.  Patient reports that she is often short of breath, uses her breathing treatments for this.  She has not had improvement today.  Patient is in some distress.  She is exhibiting tachypnea and pursed lip breathing.  She gets out of breath by the end of a sentence.  She reports that she cannot walk from 1 room to another in her house without having to stop to rest because of shortness of breath.  Patient had cardiac evaluation initiated through triage.  When she was brought back to the room she had a borderline temperature of 100.2.  This was checked rectally and confirmed fever of 102.  Patient has a lactic acid of 3.7, concerning for developing sepsis.  Given fluid bolus, empiric community-acquired pneumonia antibiotics, although chest x-ray did not show infiltrate.  CT angiography therefore performed,  no PE, no pneumonia.  Urinalysis does show signs of infection.  This may be the source of her fever.  Lactic acid has cleared with IV fluids.  CRITICAL CARE Performed by: Gilda Crease   Total critical  care time: 34 minutes  Critical care time was exclusive of separately billable procedures and treating other patients.  Critical care was necessary to treat or prevent imminent or life-threatening deterioration.  Critical care was time spent personally by me on the following activities: development of treatment plan with patient and/or surrogate as well as nursing, discussions with consultants, evaluation of patient's response to treatment, examination of patient, obtaining history from patient or surrogate, ordering and performing treatments and interventions, ordering and review of laboratory studies, ordering and review of radiographic studies, pulse oximetry and re-evaluation of patient's condition.          Final Clinical Impression(s) / ED Diagnoses Final diagnoses:  Lactic acidosis  Acute on chronic respiratory failure with hypoxia (HCC)  Urinary tract infection without hematuria, site unspecified  Chronic obstructive pulmonary disease with acute exacerbation [facility])    Rx / DC Orders ED Discharge Orders     None         Gilda Crease, MD 05/31/23 [phone]

## 2023-05-31 NOTE — H&P (Signed)
 History and Physical    Patient: Holly May:[id number] DOB: [birth year] DOA: 05/30/2023 DOS: the patient was seen and examined on 05/31/2023 PCP: Pcp, No  Patient coming from: Hospital waiting room  Chief Complaint:  Chief Complaint  Patient presents with   Chest Pain   HPI: Holly May is a 73 y.o. female with medical history significant of hypertension, paroxysmal atrial fibrillation, PAF, severe COPD, diabetes mellitus type 2, lung adenocarcinoma on radiation therapy, tobacco abuse presents with worsening cough and chills.  She reported having worsening cough and acute onset of chills  which began while she was in the hospital waiting room yesterday while her son was having neck surgery. She describes feeling 'shaky' and 'freezing' at the time. Her chronic cough has recently worsened with reports of associated tightness in her chest and wheezing.  She is not normally on oxygen but feels she needs it as she gets tired and out of breath easily, even when walking room to room inside her home.  She has a significant smoking history, smoking a pack a day for about fifty years. Despite having had lung cancer treated with radiation three times, she continues to smoke. She is not currently on chemotherapy and follows up with her doctor every six months.  Denies having any burning during urination but reports urinary frequency. No leg swelling.  She mentions a fall on earlier this week where she missed a step and and bruised her left hip.  Upon admission into the emergency department patient was noted to be febrile up to 102 F with tachypnea, blood pressures elevated up to 189/90, and O2 saturations noted to be as low as 88-89% with improvement on 2 L of oxygen.  Labs obtained 2/28 significant for WBC 11.1, BUN 25, creatinine 1.2, glucose 207, BNP 283.6, high-sensitivity troponin negative x 2, and lactic acid 3.7.  CT angiogram of the chest was obtained which noted stable left upper lobe  spiculated nodule consistent with cancer history nodular density in right lower lobe measuring up to 15 mm new from previous imaging but thought related possibly to nearby mucous plugging but neoplasm was not excluded, and small patchy groundglass opacities in the right upper lobe thought to possibly be postinflammatory in nature.  Influenza, COVID-19, and RSV screening were negative.  Urinalysis significant for positive nitrites, elevated specific gravity, protein greater than 300, many bacteria, and 11-20 WBCs.  Blood and urine cultures were obtained.  Patient had received 2 L bolus of lactated Ringer's, Zofran, Rocephin, azithromycin, acetaminophen 650 mg, and breathing treatments.  Review of Systems: As mentioned in the history of present illness. All other systems reviewed and are negative. Past Medical History:  Diagnosis Date   Arthritis    Cancer (HCC)    lung   COPD (chronic obstructive pulmonary disease) (HCC)    Depression    Dyspnea    Essential hypertension, benign    Type 2 diabetes mellitus (HCC)    Urinary incontinence    Past Surgical History:  Procedure Laterality Date   BRONCHIAL BIOPSY  07/09/2022   Procedure: BRONCHIAL BIOPSIES;  Surgeon: Josephine Igo, DO;  Location: [facility];  Service: Pulmonary;;   BRONCHIAL BRUSHINGS  07/09/2022   Procedure: BRONCHIAL BRUSHINGS;  Surgeon: Josephine Igo, DO;  Location: [facility];  Service: Pulmonary;;   BRONCHIAL NEEDLE ASPIRATION BIOPSY  07/09/2022   Procedure: BRONCHIAL NEEDLE ASPIRATION BIOPSIES;  Surgeon: Josephine Igo, DO;  Location: [facility];  Service: Pulmonary;;   CHOLECYSTECTOMY  FIDUCIAL MARKER PLACEMENT  07/09/2022   Procedure: FIDUCIAL MARKER PLACEMENT;  Surgeon: Josephine Igo, DO;  Location: [facility];  Service: Pulmonary;;   FRACTURE SURGERY Left    arm   OPEN REDUCTION INTERNAL FIXATION (ORIF) PROXIMAL PHALANX Left 04/11/2017   Procedure: OPEN REDUCTION INTERNAL FIXATION (ORIF) PROXIMAL PHALANX;   Surgeon: Dairl Ponder, MD;  Location: [facility];  Service: Orthopedics;  Laterality: Left;   RESECTION OF MEDIASTINAL MASS N/A 02/18/2019   Procedure: RESECTION OF MEDIASTINAL MASS;  Surgeon: Kerin Perna, MD;  Location: [facility] OR;  Service: Thoracic;  Laterality: N/A;   STERNOTOMY N/A 02/18/2019   Procedure: STERNOTOMY;  Surgeon: Kerin Perna, MD;  Location: [facility];  Service: Thoracic;  Laterality: N/A;   VIDEO BRONCHOSCOPY WITH ENDOBRONCHIAL ULTRASOUND Bilateral 07/09/2022   Procedure: VIDEO BRONCHOSCOPY WITH ENDOBRONCHIAL ULTRASOUND;  Surgeon: Josephine Igo, DO;  Location: [facility];  Service: Pulmonary;  Laterality: Bilateral;   Social History:  reports that she has been smoking cigarettes. She started smoking about 52 years ago. She has a 78.6 pack-year smoking history. She has never used smokeless tobacco. She reports current alcohol use. She reports that she does not use drugs.  No Known Allergies  Family History  Problem Relation Age of Onset   Diabetes Mellitus II Mother    Diabetes Mellitus II Sister    Hypertension Sister    Hypertension Brother     Prior to Admission medications   Medication Sig Start Date End Date Taking? Authorizing Provider  albuterol (PROVENTIL HFA;VENTOLIN HFA) 108 (90 Base) MCG/ACT inhaler Inhale 2 puffs into the lungs every 4 (four) hours as needed for wheezing or shortness of breath (cough, shortness of breath or wheezing.). Patient taking differently: Inhale 2 puffs into the lungs every 4 (four) hours as needed for wheezing or shortness of breath (cough). 02/18/18  Yes Shah, Pratik D, DO  alendronate (FOSAMAX) 70 MG tablet Take 70 mg by mouth once a week. Wednesday 06/17/22  Yes [provider]  amLODipine (NORVASC) 5 MG tablet Take 5 mg by mouth daily.   Yes [provider]  apixaban (ELIQUIS) 5 MG TABS tablet Take 1 tablet (5 mg total) by mouth 2 (two) times daily. 05/01/23  Yes Mallipeddi, Vishnu P, MD   bisoprolol (ZEBETA) 5 MG tablet Take 1 tablet (5 mg total) by mouth daily. 05/01/23  Yes Mallipeddi, Vishnu P, MD  citalopram (CELEXA) 20 MG tablet Take 1 tablet (20 mg total) by mouth daily. 06/14/16  Yes Johnson, Clanford L, MD  fluticasone furoate-vilanterol (BREO ELLIPTA) 100-25 MCG/ACT AEPB Inhale 1 puff into the lungs daily. 05/27/23  Yes [provider]  gabapentin (NEURONTIN) 300 MG capsule Take 300 mg by mouth daily.   Yes [provider]  ipratropium-albuterol (DUONEB) 0.5-2.5 (3) MG/3ML SOLN Take 3 mLs by nebulization every 6 (six) hours as needed (wheezing, shortness of breath). 06/11/22  Yes [provider]  lovastatin (MEVACOR) 40 MG tablet Take 40 mg by mouth daily.   Yes [provider]  mirtazapine (REMERON) 15 MG tablet Take 15 mg by mouth at bedtime.   Yes [provider]  olmesartan (BENICAR) 40 MG tablet Take 1 tablet (40 mg total) by mouth daily. 02/11/23 02/11/24 Yes Nyoka Cowden, MD  omeprazole (PRILOSEC) 20 MG capsule Take 20 mg by mouth daily.   Yes [provider]  oxybutynin (DITROPAN) 5 MG tablet Take 5 mg by mouth 2 (two) times daily.   Yes [provider]  Physical Exam: Vitals:   05/31/23 0300 05/31/23 0330 05/31/23 0400 05/31/23 0428  BP: (!) 162/81 (!) 140/84 139/76   Pulse: 80 78 69   Resp: 18  17   Temp:    99.6 F (37.6 C)  TempSrc:    Oral  SpO2: 92% 92% 95%   Weight:      Height:        Constitutional: Elderly female who appears in some respiratory distress Eyes: PERRL, lids and conjunctivae normal ENMT: Mucous membranes are moist.   Fair dentition. Neck: normal, supple  Respiratory: Decreased overall aeration with expiratory wheezes appreciated.  On Cardiovascular: Regular rate and rhythm, no murmurs / rubs / gallops. No extremity edema. 2+ pedal pulses. No carotid bruits.  Abdomen: no tenderness, no masses palpated. No hepatosplenomegaly. Bowel sounds positive.  Musculoskeletal:  no clubbing / cyanosis. No joint deformity upper and lower extremities. Good ROM, no contractures. Normal muscle tone.  Skin: no rashes, lesions, ulcers. No induration Neurologic: CN 2-12 grossly intact. Sensation intact, DTR normal. Strength 5/5 in all 4.  Psychiatric: Normal judgment and insight. Alert and oriented x 3. Normal mood.   Data Reviewed:  EKG sinus rhythm with premature atrial complexes at 86 bpm with QTc 512.  Assessment and Plan:  Sepsis secondary to urinary tract infection Present on admission.  Patient was noted to be febrile up to 102 F with tachypnea meeting SIRS criteria.  Urinalysis noted small hemoglobin with positive nitrites, greater than 300 protein, elevated specific gravity, many bacteria, and 11-20 WBCs.  Sources of sepsis thought to be UTI and/or pneumonia. -Admit to a telemetry bed -Follow-up blood and urine cultures -Continue Rocephin IV  Acute respiratory failure with hypoxia COPD exacerbation Possible community-acquired pneumonia CT angiogram of the chest did not note any signs for pulmonary embolism, but did note stable left upper lobe spiculated nodule consistent with cancer history nodular density in right lower lobe measuring up to 15 mm new from previous imaging but thought related possibly to nearby mucous plugging but neoplasm was not excluded, and small patchy groundglass opacities in the right upper lobe thought to possibly be postinflammatory in nature influenza, COVID-19, and RSV screening were negative. -Continuous pulse oximetry with oxygen maintain O2 saturation greater than 90% -Incentive spirometry and flutter valve -Check procalcitonin -DuoNebs 4 times daily and albuterol nebs as needed -Brovana and budesonide nebs twice daily -Changed azithromycin to doxycycline due to prolonged QT -Mucinex   Renal insufficiency vs. acute kidney injury On admission creatinine noted to be 1.2 with BUN 25.  Baseline creatinine previously noted to be  0.88-0.95.  Suspect prerenal cause in the setting of acute infection.  Patient had received IV fluid boluses and placed on rate.\ -Hold possible nephrotoxic agents -Recheck kidney function in a.m.  Prolonged QT interval Acute.  QTc noted to be prolonged at 512. -Held QT prolonging medications (Celexa and mirtazapine resume when medically appropriate) -Correct electrolyte abnormalities  Essential hypertension Blood pressures initially elevated up to 189/90. -Continue amlodipine and bisoprolol -Hydralazine IV as needed for elevated blood pressure  Paroxysmal atrial fibrillation on chronic anticoagulation Patient appears to be in a sinus rhythm at this time. -Continue Eliquis  History of lung cancer Patient status post radiation treatments. -Continue outpatient follow-up with oncology  Controlled diabetes mellitus type 2, without long-term use of insulin On admission glucose noted to be around 200.  Last available hemoglobin A1c was noted to be 6.1 when checked over 4 years ago. -Hypoglycemic protocols -CBG before every meal with  sensitive SSI -Adjust insulin regimen as needed  Left hip pain secondary to fall Prior to arrival patient reported missing a step and falling landing on her left hip.  On physical exam significant bruising around the left hip. -Check x-ray of the pelvis  Hyperlipidemia -Continue pharmacy substitution of pravastatin for home lovastatin  GERD -Pharmacy substitution of Protonix  Tobacco abuse Patient reports still smoking. -Nicotine patch offered  DVT prophylaxis: Eliquis Advance Care Planning:   Code Status: Full Code    Consults: None  Family Communication: none  Severity of Illness: The appropriate patient status for this patient is INPATIENT. Inpatient status is judged to be reasonable and necessary in order to provide the required intensity of service to ensure the patient's safety. The patient's presenting symptoms, physical exam findings,  and initial radiographic and laboratory data in the context of their chronic comorbidities is felt to place them at high risk for further clinical deterioration. Furthermore, it is not anticipated that the patient will be medically stable for discharge from the hospital within 2 midnights of admission.   * I certify that at the point of admission it is my clinical judgment that the patient will require inpatient hospital care spanning beyond 2 midnights from the point of admission due to high intensity of service, high risk for further deterioration and high frequency of surveillance required.*  Author: Clydie Braun, MD 05/31/2023 8:01 AM  For on call review [email or web address].

## 2023-05-31 NOTE — Plan of Care (Signed)

## 2023-05-31 NOTE — ED Notes (Signed)
 Pt was found on edge of bed with IV pulled out, pt stated she was going to take a bath. I advised pt she could not take a bath. Pt sheets were changed and she used the bathroom. Pt is now resting with call bell within reach.

## 2023-05-31 NOTE — Significant Event (Signed)
 Notified of sudden resp distress. Was on 2L O2-> briefly on NRB and now on 8L of HFNC with sat at / above goal. Also in Afib RVR with rates in 140-160s, other vs wnl. On exam she has slight increased WOB, decreased air movement, expiratory wheezing. Tachycardic and irregular. Overall appears comfortable on HFNC.    A/P   Resp distress - improved  - Continue nebs, switch to ipratropium / xopenex given her Afib RVR  - Already on steroids, abx, continue same. May consider Methylpred IV if worsening course   Afib RVR  - Start on Diltiazem 10 mg IV x 1 followed by gtt   Dispo - change to progressive for Dilt gtt and in case needs BiPAP if breathing worsens again   Dolly Rias, MD  [facility]

## 2023-06-01 ENCOUNTER — Inpatient Hospital Stay (HOSPITAL_COMMUNITY)

## 2023-06-01 DIAGNOSIS — A419 Sepsis, unspecified organism: Secondary | ICD-10-CM | POA: Diagnosis not present

## 2023-06-01 LAB — GLUCOSE, CAPILLARY
Glucose-Capillary: 157 mg/dL — ABNORMAL HIGH (ref 70–99)
Glucose-Capillary: 154 mg/dL — ABNORMAL HIGH (ref 70–99)
Glucose-Capillary: 255 mg/dL — ABNORMAL HIGH (ref 70–99)
Glucose-Capillary: 174 mg/dL — ABNORMAL HIGH (ref 70–99)
Glucose-Capillary: 175 mg/dL — ABNORMAL HIGH (ref 70–99)

## 2023-06-01 LAB — CBC
HCT: 33.8 % — ABNORMAL LOW (ref 36.0–46.0)
Hemoglobin: 11 g/dL — ABNORMAL LOW (ref 12.0–15.0)
MCH: 33 pg (ref 26.0–34.0)
MCHC: 32.5 g/dL (ref 30.0–36.0)
MCV: 101.5 fL — ABNORMAL HIGH (ref 80.0–100.0)
Platelets: 204 10*3/uL (ref 150–400)
RBC: 3.33 MIL/uL — ABNORMAL LOW (ref 3.87–5.11)
RDW: 14.1 % (ref 11.5–15.5)
WBC: 7.7 10*3/uL (ref 4.0–10.5)
nRBC: 0 % (ref 0.0–0.2)

## 2023-06-01 LAB — URINE CULTURE

## 2023-06-01 LAB — BASIC METABOLIC PANEL
Anion gap: 16 — ABNORMAL HIGH (ref 5–15)
BUN: 18 mg/dL (ref 8–23)
CO2: 28 mmol/L (ref 22–32)
Calcium: 8.8 mg/dL — ABNORMAL LOW (ref 8.9–10.3)
Chloride: 95 mmol/L — ABNORMAL LOW (ref 98–111)
Creatinine, Ser: 1.16 mg/dL — ABNORMAL HIGH (ref 0.44–1.00)
GFR, Estimated: 51 mL/min — ABNORMAL LOW (ref >60)
Glucose, Bld: 161 mg/dL — ABNORMAL HIGH (ref 70–99)
Potassium: 3.7 mmol/L (ref 3.5–5.1)
Sodium: 139 mmol/L (ref 135–145)

## 2023-06-01 LAB — C-REACTIVE PROTEIN: CRP: 7 mg/dL — ABNORMAL HIGH (ref <1.0)

## 2023-06-01 LAB — TSH: TSH: 0.294 u[IU]/mL — ABNORMAL LOW (ref 0.350–4.500)

## 2023-06-01 LAB — T4, FREE: Free T4: 0.66 ng/dL (ref 0.61–1.12)

## 2023-06-01 LAB — MAGNESIUM: Magnesium: 1.6 mg/dL — ABNORMAL LOW (ref 1.7–2.4)

## 2023-06-01 LAB — PROCALCITONIN: Procalcitonin: 0.25 ng/mL

## 2023-06-01 LAB — BRAIN NATRIURETIC PEPTIDE: B Natriuretic Peptide: 820 pg/mL — ABNORMAL HIGH (ref 0.0–100.0)

## 2023-06-01 MED ORDER — DILTIAZEM HCL 60 MG PO TABS
90.0000 mg | ORAL_TABLET | Freq: Three times a day (TID) | ORAL | Status: DC
  Administered 2023-06-01 – 2023-06-06 (×15): 90 mg via ORAL
  Filled 2023-06-01 (×16): qty 1

## 2023-06-01 MED ORDER — GABAPENTIN 300 MG PO CAPS
300.0000 mg | ORAL_CAPSULE | Freq: Every day | ORAL | Status: DC
  Administered 2023-06-02 – 2023-06-05 (×5): 300 mg via ORAL
  Filled 2023-06-01 (×5): qty 1

## 2023-06-01 MED ORDER — MAGNESIUM SULFATE 4 GM/100ML IV SOLN
4.0000 g | Freq: Once | INTRAVENOUS | Status: AC
  Administered 2023-06-01: 4 g via INTRAVENOUS
  Filled 2023-06-01: qty 100

## 2023-06-01 MED ORDER — CITALOPRAM HYDROBROMIDE 20 MG PO TABS: 20.0000 mg | ORAL_TABLET | Freq: Every day | ORAL | Status: DC

## 2023-06-01 MED ORDER — LEVALBUTEROL HCL 0.63 MG/3ML IN NEBU: 0.6300 mg | INHALATION_SOLUTION | Freq: Four times a day (QID) | RESPIRATORY_TRACT | Status: DC | PRN

## 2023-06-01 MED ORDER — FUROSEMIDE 10 MG/ML IJ SOLN
20.0000 mg | Freq: Once | INTRAMUSCULAR | Status: AC
  Administered 2023-06-01: 20 mg via INTRAVENOUS
  Filled 2023-06-01: qty 2

## 2023-06-01 NOTE — Progress Notes (Signed)
 PROGRESS NOTE                                                                                                                                                                                                             Patient Demographics:    Holly May, is a 73 y.o. female, DOB - 06/28/1950, GNF:[id number]  Outpatient Primary MD for the patient is Pcp, No    LOS - 1  Admit date - 05/30/2023    Chief Complaint  Patient presents with   Chest Pain       Brief Narrative (HPI from H&P)   73 y.o. female with medical history significant of hypertension, paroxysmal atrial fibrillation, PAF, severe COPD, diabetes mellitus type 2, lung adenocarcinoma on radiation therapy, tobacco abuse presents with worsening cough and chills.  [name] with sepsis due to combination of UTI and community-acquired pneumonia, COPD exacerbation along with atrial fibrillation with RVR.   Subjective:    Holly May today has, No headache, No chest pain, No abdominal pain - No Nausea, No new weakness tingling or numbness, +ve cough - mild SOB.   Assessment  & Plan :    Sepsis secondary to urinary tract infection along with community-acquired pneumonia with COPD exacerbation, acute hypoxic respiratory failure due to community-acquired pneumonia and COPD exacerbation.  Abscess present on admission has resolved, she has been placed on appropriate IV antibiotics which will be continued, low-dose steroids for COPD exacerbation, follow cultures, monitor bladder scans, continue nebulizer treatments, encouraged to sit in chair use I-S and flutter valve for pulmonary toiletry, advance activity and titrate down oxygen.  Strictly counseled to quit smoking.  Initial viral screen negative.  Monitor inflammatory markers and cultures.  SpO2: 96 % O2 Flow Rate (L/min): (S) 4 L/min     Renal insufficiency vs. acute kidney injury On admission creatinine noted to be 1.2  with BUN 25.  Baseline creatinine previously noted to be 0.88-0.95.  Currently stable will continue to monitor.   Prolonged QT interval Acute.  QTc noted to be prolonged at 512.  Replace electrolytes, Celexa and mirtazapine held, recheck EKG on 06/02/2023.   Essential hypertension On low-dose beta-blocker now on oral Cardizem along with as needed IV hydralazine.  Monitor and adjust.   Hypomagnesemia.  Replaced.    Paroxysmal atrial fibrillation on chronic anticoagulation Went in RVR night  of 05/31/2023, required Cardizem drip, on low-dose oral beta-blocker which will be continued, switch IV Cardizem to oral, check TSH and echo.  Continue Eliquis.   History of lung cancer with evidence of lung nodules on CT scan. Patient status post radiation treatments. -Continue outpatient follow-up with oncology and pulmonary outpatient to be arranged by PCP.   Left hip pain secondary to fall Prior to arrival patient reported missing a step and falling landing on her left hip.  On physical exam significant bruising around the left hip.  X-ray of the pelvis negative, pain has improved, PT and monitor.   Hyperlipidemia -Continue pharmacy substitution of pravastatin for home lovastatin   GERD -Pharmacy substitution of Protonix   Tobacco abuse Patient reports still smoking.  Consulted to quit. -Nicotine patch offered   Controlled diabetes mellitus type 2, without long-term use of insulin On admission glucose noted to be around 200.  Last available hemoglobin A1c was noted to be 6.1 when checked over 4 years ago. -Hypoglycemic protocols -CBG before every meal with sensitive SSI -Adjust insulin regimen as needed      Condition - Extremely Guarded  Family Communication  :   None  Code Status :  Full  Consults  :  None  PUD Prophylaxis : PPI   Procedures  :     TTE  CTA - Stable left upper lobe spiculated nodule consistent with the given clinical history. Nodular density in the right lower  lobe measuring up to 15 mm. This is new from the prior exam but given the adjacent mucous plugging and inflammatory change this may be inflammatory in nature. Short-term follow-up is recommended. Neoplasm is not excluded on the basis of this exam. Small patchy ground-glass opacities in the right upper lobe likely postinflammatory in nature. No evidence of pulmonary embolism. Aortic Atherosclerosis (ICD10-I70.0) and Emphysema (ICD10-J43.9)      Disposition Plan  :    Status is: Inpatient    DVT Prophylaxis  :     apixaban (ELIQUIS) tablet 5 mg     Lab Results  Component Value Date   PLT 204 06/01/2023    Diet :  Diet Order             Diet Heart Room service appropriate? Yes; Fluid consistency: Thin  Diet effective now                    Inpatient Medications  Scheduled Meds:  apixaban  5 mg Oral BID   arformoterol  15 mcg Nebulization BID   bisoprolol  5 mg Oral Daily   budesonide (PULMICORT) nebulizer solution  0.5 mg Nebulization BID   citalopram  20 mg Oral Daily   diltiazem  90 mg Oral Q8H   furosemide  20 mg Intravenous Once   gabapentin  300 mg Oral Daily   guaiFENesin  600 mg Oral BID   insulin aspart  0-5 Units Subcutaneous QHS   insulin aspart  0-9 Units Subcutaneous TID WC   ipratropium  0.5 mg Nebulization Q6H   levalbuterol  0.63 mg Nebulization Q6H   nicotine  21 mg Transdermal Daily   oxybutynin  5 mg Oral BID   pantoprazole  40 mg Oral Daily   pravastatin  40 mg Oral q1800   predniSONE  40 mg Oral Q breakfast   sodium chloride flush  3 mL Intravenous Q12H   Continuous Infusions:  cefTRIAXone (ROCEPHIN)  IV Stopped (05/31/23 2230)   doxycycline (VIBRAMYCIN) IV Stopped (05/31/23 2200)  magnesium sulfate bolus IVPB     PRN Meds:.acetaminophen **OR** acetaminophen, trimethobenzamide    Objective:   Vitals:   06/01/23 0027 06/01/23 0032 06/01/23 0354 06/01/23 0843  BP:  (!) 118/50 124/72   Pulse: 68 (!) 56 (!) 58   Resp: 17 (!) 23 16    Temp:  97.6 F (36.4 C) 97.6 F (36.4 C)   TempSrc:  Oral Oral   SpO2:  100% 100% 96%  Weight:      Height:        Wt Readings from Last 3 Encounters:  05/30/23 64 kg  03/18/23 64 kg  02/11/23 63.5 kg     Intake/Output Summary (Last 24 hours) at 06/01/2023 0944 Last data filed at 05/31/2023 2133 Gross per 24 hour  Intake 253 ml  Output --  Net 253 ml     Physical Exam  Awake Alert, No new F.N deficits, Normal affect [place].AT,PERRAL Supple Neck, No JVD,   Symmetrical Chest wall movement, Good air movement bilaterally, coarse bilateral breath sounds with bibasilar crackles RRR,No Gallops,Rubs or new Murmurs,  +ve B.Sounds, Abd Soft, No tenderness,   No Cyanosis, Clubbing or edema       Data Review:    Recent Labs  Lab 05/30/23 1944 06/01/23 0611  WBC 11.1* 7.7  HGB 13.5 11.0*  HCT 40.2 33.8*  PLT 263 204  MCV 98.5 101.5*  MCH 33.1 33.0  MCHC 33.6 32.5  RDW 14.4 14.1    Recent Labs  Lab 05/30/23 1944 05/31/23 0014 05/31/23 0026 05/31/23 0349 05/31/23 1445 05/31/23 1828 06/01/23 0611  NA 137  --   --   --   --   --  139  K 3.9  --   --   --   --   --  3.7  CL 93*  --   --   --   --   --  95*  CO2 26  --   --   --   --   --  28  ANIONGAP 18*  --   --   --   --   --  16*  GLUCOSE 207*  --   --   --   --   --  161*  BUN 25*  --   --   --   --   --  18  CREATININE 1.20*  --   --   --   --   --  1.16*  CRP  --   --   --   --   --   --  7.0*  PROCALCITON  --   --   --   --  0.17  --  0.25  LATICACIDVEN  --   --  3.7* 1.8  --   --   --   HGBA1C  --   --   --   --   --  6.0*  --   BNP  --  283.6*  --   --   --   --  820.0*  MG  --   --   --   --   --   --  1.6*  CALCIUM 9.2  --   --   --   --   --  8.8*      Recent Labs  Lab 05/30/23 1944 05/31/23 0014 05/31/23 0026 05/31/23 0349 05/31/23 1445 05/31/23 1828 06/01/23 0611  CRP  --   --   --   --   --   --  7.0*  PROCALCITON  --   --   --   --  0.17  --  0.25  LATICACIDVEN  --   --  3.7* 1.8  --    --   --   HGBA1C  --   --   --   --   --  6.0*  --   BNP  --  283.6*  --   --   --   --  820.0*  MG  --   --   --   --   --   --  1.6*  CALCIUM 9.2  --   --   --   --   --  8.8*    --------------------------------------------------------------------------------------------------------------- No results found for: "CHOL", "HDL", "LDLCALC", "LDLDIRECT", "TRIG", "CHOLHDL"  Lab Results  Component Value Date   HGBA1C 6.0 (H) 05/31/2023   No results for input(s): "TSH", "T4TOTAL", "FREET4", "T3FREE", "THYROIDAB" in the last 72 hours. No results for input(s): "VITAMINB12", "FOLATE", "FERRITIN", "TIBC", "IRON", "RETICCTPCT" in the last 72 hours. ------------------------------------------------------------------------------------------------------------------ Cardiac Enzymes No results for input(s): "CKMB", "TROPONINI", "MYOGLOBIN" in the last 168 hours.  Invalid input(s): "CK"  Micro Results Recent Results (from the past 240 hours)  Culture, blood (Routine X 2) w Reflex to ID Panel     Status: None (Preliminary result)   Collection Time: 05/31/23 12:14 AM   Specimen: BLOOD  Result Value Ref Range Status   Specimen Description BLOOD SITE NOT SPECIFIED  Final   Special Requests   Final    BOTTLES DRAWN AEROBIC AND ANAEROBIC Blood Culture adequate volume   Culture   Final    NO GROWTH 1 DAY Performed at [facility], [address] [place], [place] [address]    Report Status PENDING  Incomplete  Culture, blood (Routine X 2) w Reflex to ID Panel     Status: None (Preliminary result)   Collection Time: 05/31/23 12:14 AM   Specimen: BLOOD  Result Value Ref Range Status   Specimen Description BLOOD SITE NOT SPECIFIED  Final   Special Requests   Final    BOTTLES DRAWN AEROBIC AND ANAEROBIC Blood Culture results may not be optimal due to an inadequate volume of blood received in culture bottles   Culture   Final    NO GROWTH 1 DAY Performed at [facility], [address] [place], [place] [address]    Report Status PENDING  Incomplete  Resp panel by RT-PCR (RSV, Flu A&B, Covid)     Status: None   Collection Time: 05/31/23 12:14 AM   Specimen: Nasal Swab  Result Value Ref Range Status   SARS Coronavirus 2 by RT PCR NEGATIVE NEGATIVE Final   Influenza A by PCR NEGATIVE NEGATIVE Final   Influenza B by PCR NEGATIVE NEGATIVE Final    Comment: (NOTE) The Xpert Xpress SARS-CoV-2/FLU/RSV plus assay is intended as an aid in the diagnosis of influenza from Nasopharyngeal swab specimens and should not be used as a sole basis for treatment. Nasal washings and aspirates are unacceptable for Xpert Xpress SARS-CoV-2/FLU/RSV testing.  Fact Sheet for Patients: BloggerCourse.com  Fact Sheet for Healthcare Providers: SeriousBroker.it  This test is not yet approved or cleared by the [place] FDA and has been authorized for detection and/or diagnosis of SARS-CoV-2 by FDA under an Emergency Use Authorization (EUA). This EUA will remain in effect (meaning this test can be used) for the duration of the COVID-19 declaration under Section 564(b)(1) of the Act, 21  U.S.C. section 360bbb-3(b)(1), unless the authorization is terminated or revoked.     Resp Syncytial Virus by PCR NEGATIVE NEGATIVE Final    Comment: (NOTE) Fact Sheet for Patients: BloggerCourse.com  Fact Sheet for Healthcare Providers: SeriousBroker.it  This test is not yet approved or cleared by the [place] FDA and has been authorized for detection and/or diagnosis of SARS-CoV-2 by FDA under an Emergency Use Authorization (EUA). This EUA will remain in effect (meaning this test can be used) for the duration of the COVID-19 declaration under Section 564(b)(1) of the Act, 21 U.S.C. section 360bbb-3(b)(1), unless the authorization is terminated or revoked.  Performed at [facility],  [address], [place], [place] [address]   Urine Culture     Status: Abnormal   Collection Time: 05/31/23 12:51 AM   Specimen: Urine, Random  Result Value Ref Range Status   Specimen Description URINE, RANDOM  Final   Special Requests   Final    NONE Reflexed from [id number] Performed at [facility], [address] [place], [place] [address]    Culture MULTIPLE SPECIES PRESENT, SUGGEST RECOLLECTION (A)  Final   Report Status 06/01/2023 FINAL  Final    Radiology Report DG Chest Port 1 View Result Date: 06/01/2023 CLINICAL DATA:  Shortness of breath EXAM: PORTABLE CHEST 1 VIEW COMPARISON:  05/30/2023 FINDINGS: Linear and nodular opacity in the left lung with fiducial marker, stable and known. Volume loss and indistinct streaky density at the right lung base. No edema, effusion, or pneumothorax. Normal heart size. Prior median sternotomy. IMPRESSION: Mild infiltrate at the right lung base where there was airway impaction/opacification by most recent CT. Electronically Signed   By: Tiburcio Pea M.D.   On: 06/01/2023 07:11   DG Pelvis 1-2 Views Result Date: 05/31/2023 CLINICAL DATA:  Pain after fall. EXAM: PELVIS - 1-2 VIEW COMPARISON:  07/28/2021 FINDINGS: The cortical margins of the bony pelvis are intact. No fracture. Pubic symphysis and sacroiliac joints are congruent. Both femoral heads are well-seated in the respective acetabula. Calcified uterine fibroids. IMPRESSION: No pelvic fracture. Electronically Signed   By: Narda Rutherford M.D.   On: 05/31/2023 20:21   CT Angio Chest Pulmonary Embolism (PE) W or WO Contrast Result Date: 05/31/2023 CLINICAL DATA:  Chest pain and difficulty breathing EXAM: CT ANGIOGRAPHY CHEST WITH CONTRAST TECHNIQUE: Multidetector CT imaging of the chest was performed using the standard protocol during bolus administration of intravenous contrast. Multiplanar CT image reconstructions and MIPs were obtained to evaluate the vascular anatomy. RADIATION DOSE REDUCTION:  This exam was performed according to the departmental dose-optimization program which includes automated exposure control, adjustment of the mA and/or kV according to patient size and/or use of iterative reconstruction technique. CONTRAST:  75mL OMNIPAQUE IOHEXOL 350 MG/ML SOLN COMPARISON:  Chest x-ray from the previous day, CT from 12/30/2022 FINDINGS: Cardiovascular: Atherosclerotic calcifications of the thoracic aorta are noted. No aneurysmal dilatation is noted. The degree of opacification is limited precluding evaluation for dissection. Heart is at the upper limits of normal in size. The pulmonary artery shows a normal branching pattern bilaterally. No intraluminal filling defect is identified to suggest pulmonary embolism. Mediastinum/Nodes: Thoracic inlet is within normal limits. No hilar or mediastinal adenopathy is noted. The esophagus as visualized is within normal limits. Lungs/Pleura: Lungs demonstrate some emphysematous changes. Persistent spiculated nodule is noted in the left upper lobe with some tenting of the adjacent fissure stable in appearance. Fiducial markers are noted. No new spiculated nodular density is seen.  Patchy ground-glass opacities are noted in the right upper lobe likely postinflammatory in nature. These were not seen on most recent exam. Atelectatic changes are noted in the right lower lobe related to significant mucous plugging of the right lower lobe. A nodular density is noted in the right lower lobe with air bronchograms within. This measures 15 mm in greatest dimension and is new from the prior exam best visualized on image number 100 of series 6. Portion of these changes may be inflammatory although the possibility of underlying neoplasm deserves consideration well. No sizable effusion is seen. Upper Abdomen: Visualized upper abdomen is unremarkable. Musculoskeletal: Degenerative changes of the thoracic spine are seen. No acute rib abnormality is noted. Chronic compression  deformities in the lower thoracic spine are noted. Review of the MIP images confirms the above findings. IMPRESSION: Stable left upper lobe spiculated nodule consistent with the given clinical history. Nodular density in the right lower lobe measuring up to 15 mm. This is new from the prior exam but given the adjacent mucous plugging and inflammatory change this may be inflammatory in nature. Short-term follow-up is recommended. Neoplasm is not excluded on the basis of this exam. Small patchy ground-glass opacities in the right upper lobe likely postinflammatory in nature. No evidence of pulmonary embolism. Aortic Atherosclerosis (ICD10-I70.0) and Emphysema (ICD10-J43.9). Electronically Signed   By: Alcide Clever M.D.   On: 05/31/2023 02:26   DG Chest 2 View Result Date: 05/30/2023 CLINICAL DATA:  Chest pain EXAM: CHEST - 2 VIEW COMPARISON:  CT from 12/30/2022 FINDINGS: Cardiac shadow is stable. Fiducial marker is noted in the left upper adjacent to a linear area of density corresponding to prior nodule. The nodule is less well visualized on this exam. Multiple rib fractures with healing are noted on the right similar to that seen on prior CT. No focal infiltrate or sizable effusion is seen. Multilevel compression deformities are noted in the thoracic spine stable from previous exam. IMPRESSION: No acute abnormality is noted. Chronic changes as described above. Electronically Signed   By: Alcide Clever M.D.   On: 05/30/2023 20:52     Signature  -   Susa Raring M.D on 06/01/2023 at 9:44 AM   -  To page go to [email or web address]

## 2023-06-01 NOTE — Evaluation (Signed)
 Physical Therapy Evaluation Patient Details Name: Holly May MRN: [id number] DOB: [birth year] Today's Date: 06/01/2023  History of Present Illness  73 y.o. female presents 05/31/23 with worsening cough and chills.  Sepsis 2/2 UTI; PMH significant of hypertension, paroxysmal atrial fibrillation, PAF, severe COPD, diabetes mellitus type 2, lung adenocarcinoma on radiation therapy, tobacco abuse  Clinical Impression   Pt admitted secondary to problem above with deficits below. PTA patient was living with son (who has health issues and is currently hospitalized s/p neck surgery). She is typically independent with her mobility but endorses she is off-balance. She fell (missed a step as descending) just PTA.  Pt currently requires CGA with max cues for safe use of rollator to ambulate.  Anticipate patient will benefit from PT to address problems listed below.Will continue to follow acutely to maximize functional mobility independence and safety. Recommend HHPT on discharge as pt can benefit from home safety evaluation, balance and gait training.          If plan is discharge home, recommend the following: A little help with walking and/or transfers;Assistance with cooking/housework;Help with stairs or ramp for entrance   Can travel by private vehicle        Equipment Recommendations Rollator (4 wheels)  Recommendations for Other Services       Functional Status Assessment Patient has had a recent decline in their functional status and demonstrates the ability to make significant improvements in function in a reasonable and predictable amount of time.     Precautions / Restrictions Precautions Precautions: Fall;Other (comment) Recall of Precautions/Restrictions: Intact Precaution/Restrictions Comments: monitor O2 sats      Mobility  Bed Mobility               General bed mobility comments: up in recliner    Transfers Overall transfer level: Needs assistance Equipment used:  Rollator (4 wheels) Transfers: Sit to/from Stand Sit to Stand: Contact guard assist           General transfer comment: vc for proper use of rollator/brakes both sit to stand and stand to sit    Ambulation/Gait Ambulation/Gait assistance: Contact guard assist Gait Distance (Feet): 180 Feet Assistive device: Rollator (4 wheels) Gait Pattern/deviations: Step-through pattern, Decreased stride length, Antalgic, Drifts right/left   Gait velocity interpretation: 1.31 - 2.62 ft/sec, indicative of limited community ambulator   General Gait Details: has difficulty keeping rollator on straight path with greater than normal displacement rt and left  Stairs            Wheelchair Mobility     Tilt Bed    Modified Rankin (Stroke Patients Only)       Balance Overall balance assessment: Needs assistance         Standing balance support: No upper extremity supported Standing balance-Leahy Scale: Fair Standing balance comment: requires UE support with walking                             Pertinent Vitals/Pain Pain Assessment Pain Assessment: Faces Faces Pain Scale: Hurts a little bit Pain Location: left hip from recent fall Pain Descriptors / Indicators: Discomfort, Guarding Pain Intervention(s): Limited activity within patient's tolerance, Monitored during session    Home Living Family/patient expects to be discharged to:: Private residence Living Arrangements: Children (son; just had neck surgery) Available Help at Discharge: Family;Available 24 hours/day (supervision) Type of Home: House Home Access: Stairs to enter Entrance Stairs-Rails: None Entrance Stairs-Number of Steps: 3  Home Layout: One level Home Equipment: Cane - single point      Prior Function Prior Level of Function : Independent/Modified Independent;Driving             Mobility Comments: independent; reports decr balance but doesn't use cane bc she doesn't trust it; gets  dyspneic; does grocery shopping ADLs Comments: independent     Extremity/Trunk Assessment   Upper Extremity Assessment Upper Extremity Assessment: Overall WFL for tasks assessed    Lower Extremity Assessment Lower Extremity Assessment: Overall WFL for tasks assessed    Cervical / Trunk Assessment Cervical / Trunk Assessment: Normal  Communication   Communication Communication: No apparent difficulties    Cognition Arousal: Alert Behavior During Therapy: WFL for tasks assessed/performed   PT - Cognitive impairments: Safety/Judgement                       PT - Cognition Comments: pt stating "I got it" when PT trying to educate her on safest use of rollator; had removed her oxygen to walk to bathroom despite recent desaturation with RN Following commands: Intact       Cueing Cueing Techniques: Verbal cues     General Comments General comments (skin integrity, edema, etc.): HR 77 sats 93% on 2L O2; pt found in bathroom without oxygen or monitor (she had disconnected herself) with sats 84% and mild dyspnea; resumed oxygen with sats 89% after 1 minutes    Exercises Other Exercises Other Exercises: IS technique x 10 reps; flutter valve technique x 10 reps   Assessment/Plan    PT Assessment Patient needs continued PT services  PT Problem List Decreased activity tolerance;Decreased balance;Decreased mobility;Decreased knowledge of use of DME;Decreased safety awareness;Decreased knowledge of precautions;Cardiopulmonary status limiting activity       PT Treatment Interventions DME instruction;Gait training;Stair training;Functional mobility training;Therapeutic activities;Therapeutic exercise;Balance training;Patient/family education    PT Goals (Current goals can be found in the Care Plan section)  Acute Rehab PT Goals Patient Stated Goal: wants to get a small portable oxygen tank that she can manage PT Goal Formulation: With patient Time For Goal Achievement:  06/15/23 Potential to Achieve Goals: Good    Frequency Min 1X/week     Co-evaluation               AM-PAC PT "6 Clicks" Mobility  Outcome Measure Help needed turning from your back to your side while in a flat bed without using bedrails?: None Help needed moving from lying on your back to sitting on the side of a flat bed without using bedrails?: A Little Help needed moving to and from a bed to a chair (including a wheelchair)?: A Little Help needed standing up from a chair using your arms (e.g., wheelchair or bedside chair)?: A Little Help needed to walk in hospital room?: A Little Help needed climbing 3-5 steps with a railing? : A Little 6 Click Score: 19    End of Session Equipment Utilized During Treatment: Oxygen Activity Tolerance: Patient tolerated treatment well Patient left: in chair;with call bell/phone within reach Nurse Communication: Mobility status;Other (comment) (up to bathroom without O2) PT Visit Diagnosis: Unsteadiness on feet (R26.81)    Time: 1610-9604 PT Time Calculation (min) (ACUTE ONLY): 32 min   Charges:   PT Evaluation $PT Eval Low Complexity: 1 Low PT Treatments $Gait Training: 8-22 mins PT General Charges $$ ACUTE PT VISIT: 1 Visit          Jerolyn Center, PT Acute Rehabilitation Services  Office [phone]   Zena Amos 06/01/2023, 12:03 PM

## 2023-06-01 NOTE — Progress Notes (Signed)
 SATURATION QUALIFICATIONS: (This note is used to comply with regulatory documentation for home oxygen)  Patient Saturations on Room Air at Rest = 80%  Patient Saturations on Room Air while Ambulating = 80%  Patient Saturations on 2 Liters of oxygen while Ambulating = 94%  Please briefly explain why patient needs home oxygen: Patient requires oxygen when at rest and during ambulation due to desatting.

## 2023-06-01 NOTE — Evaluation (Signed)
 Clinical/Bedside Swallow Evaluation Patient Details  Name: Holly May MRN: [id number] Date of Birth: [birth year]  Today's Date: 06/01/2023 Time: SLP Start Time (ACUTE ONLY): 1526 SLP Stop Time (ACUTE ONLY): 1540 SLP Time Calculation (min) (ACUTE ONLY): 14 min  Past Medical History:  Past Medical History:  Diagnosis Date   Arthritis    Cancer (HCC)    lung   COPD (chronic obstructive pulmonary disease) (HCC)    Depression    Dyspnea    Essential hypertension, benign    Type 2 diabetes mellitus (HCC)    Urinary incontinence    Past Surgical History:  Past Surgical History:  Procedure Laterality Date   BRONCHIAL BIOPSY  07/09/2022   Procedure: BRONCHIAL BIOPSIES;  Surgeon: Josephine Igo, DO;  Location: [facility];  Service: Pulmonary;;   BRONCHIAL BRUSHINGS  07/09/2022   Procedure: BRONCHIAL BRUSHINGS;  Surgeon: Josephine Igo, DO;  Location: [facility];  Service: Pulmonary;;   BRONCHIAL NEEDLE ASPIRATION BIOPSY  07/09/2022   Procedure: BRONCHIAL NEEDLE ASPIRATION BIOPSIES;  Surgeon: Josephine Igo, DO;  Location: [facility];  Service: Pulmonary;;   CHOLECYSTECTOMY     FIDUCIAL MARKER PLACEMENT  07/09/2022   Procedure: FIDUCIAL MARKER PLACEMENT;  Surgeon: Josephine Igo, DO;  Location: [facility];  Service: Pulmonary;;   FRACTURE SURGERY Left    arm   OPEN REDUCTION INTERNAL FIXATION (ORIF) PROXIMAL PHALANX Left 04/11/2017   Procedure: OPEN REDUCTION INTERNAL FIXATION (ORIF) PROXIMAL PHALANX;  Surgeon: Dairl Ponder, MD;  Location: [facility];  Service: Orthopedics;  Laterality: Left;   RESECTION OF MEDIASTINAL MASS N/A 02/18/2019   Procedure: RESECTION OF MEDIASTINAL MASS;  Surgeon: Kerin Perna, MD;  Location: [facility];  Service: Thoracic;  Laterality: N/A;   STERNOTOMY N/A 02/18/2019   Procedure: STERNOTOMY;  Surgeon: Kerin Perna, MD;  Location: [facility] OR;  Service: Thoracic;  Laterality: N/A;   VIDEO BRONCHOSCOPY WITH ENDOBRONCHIAL ULTRASOUND  Bilateral 07/09/2022   Procedure: VIDEO BRONCHOSCOPY WITH ENDOBRONCHIAL ULTRASOUND;  Surgeon: Josephine Igo, DO;  Location: [facility];  Service: Pulmonary;  Laterality: Bilateral;   HPI:  Holly May is a 73 yo female presenting to ED 3/1 with worsening cough and chills. Admitted with sepsis secondary to UTI. PMH includes HTN, paroxysmal A-fib, severe COPD, severe COPD, T2DM, lung adenocarcinoma on radiation therapy, tobacco abuse    Assessment / Plan / Recommendation  Clinical Impression  Pt denies a history of dysphagia, but endorses frequent esophageal reflux. She is edentulous and states her dentures are ill-fitting so she does not typically wear them when eating. Observed with trials of thin liquids, purees, and solids without overt s/s of dysphagia or aspiration. Continue current diet without ongoing SLP f/u. Will sign off at this time. SLP Visit Diagnosis: Dysphagia, unspecified (R13.10)    Aspiration Risk  Mild aspiration risk    Diet Recommendation Thin liquid;Regular    Liquid Administration via: Cup;Straw Medication Administration: Whole meds with liquid Supervision: Patient able to self feed Compensations: Minimize environmental distractions;Slow rate;Small sips/bites Postural Changes: Seated upright at 90 degrees;Remain upright for at least 30 minutes after po intake    Other  Recommendations Oral Care Recommendations: Oral care BID    Recommendations for follow up therapy are one component of a multi-disciplinary discharge planning process, led by the attending physician.  Recommendations may be updated based on patient status, additional functional criteria and insurance authorization.  Follow up Recommendations No SLP follow up      Assistance Recommended at Discharge  Functional Status Assessment Patient has not had a recent decline in their functional status  Frequency and Duration            Prognosis Prognosis for improved oropharyngeal function: Good       Swallow Study   General HPI: Holly May is a 73 yo female presenting to ED 3/1 with worsening cough and chills. Admitted with sepsis secondary to UTI. PMH includes HTN, paroxysmal A-fib, severe COPD, severe COPD, T2DM, lung adenocarcinoma on radiation therapy, tobacco abuse Type of Study: Bedside Swallow Evaluation Previous Swallow Assessment: none in chart Diet Prior to this Study: Regular;Thin liquids (Level 0) Temperature Spikes Noted: No Respiratory Status: Nasal cannula History of Recent Intubation: No Behavior/Cognition: Alert;Cooperative;Pleasant mood Oral Cavity Assessment: Within Functional Limits Oral Care Completed by SLP: No Oral Cavity - Dentition: Edentulous;Other (Comment) (has ill-fitting dentures that she does not wear) Vision: Functional for self-feeding Self-Feeding Abilities: Able to feed self Patient Positioning: Upright in chair Baseline Vocal Quality: Normal Volitional Cough: Strong Volitional Swallow: Able to elicit    Oral/Motor/Sensory Function Overall Oral Motor/Sensory Function: Within functional limits   Ice Chips Ice chips: Not tested   Thin Liquid Thin Liquid: Within functional limits Presentation: Straw;Self Fed    Nectar Thick Nectar Thick Liquid: Not tested   Honey Thick Honey Thick Liquid: Not tested   Puree Puree: Within functional limits Presentation: Spoon;Self Fed   Solid     Solid: Within functional limits Presentation: Self Fed      Gwynneth Aliment, M.A., CF-SLP Speech Language Pathology, Acute Rehabilitation Services  Secure Chat preferred [phone]  06/01/2023,3:58 PM

## 2023-06-01 NOTE — Progress Notes (Signed)
 Patient transitioned to 2L HFNC. No complaints of pain so far this shift. Ambulatory walk test complete. Patient educated on incentive spirometer usage and flutter valve. EKG complete patient is NSR, VSS. 22g PIV placed in right AC.  1. Lactic acidosis   2. Acute on chronic respiratory failure with hypoxia (HCC)   3. Urinary tract infection without hematuria, site unspecified   4. Chronic obstructive pulmonary disease with acute exacerbation (HCC)    Past Medical History:  Diagnosis Date   Arthritis    Cancer (HCC)    lung   COPD (chronic obstructive pulmonary disease) (HCC)    Depression    Dyspnea    Essential hypertension, benign    Type 2 diabetes mellitus (HCC)    Urinary incontinence    Current Facility-Administered Medications  Medication Dose Route Frequency Provider Last Rate Last Admin   acetaminophen (TYLENOL) tablet 650 mg  650 mg Oral Q6H PRN Madelyn Flavors A, MD   650 mg at 05/31/23 1932   Or   acetaminophen (TYLENOL) suppository 650 mg  650 mg Rectal Q6H PRN Clydie Braun, MD       apixaban (ELIQUIS) tablet 5 mg  5 mg Oral BID Madelyn Flavors A, MD   5 mg at 06/01/23 0803   arformoterol (BROVANA) nebulizer solution 15 mcg  15 mcg Nebulization BID Madelyn Flavors A, MD   15 mcg at 06/01/23 0842   bisoprolol (ZEBETA) tablet 5 mg  5 mg Oral Daily Katrinka Blazing, Rondell A, MD   5 mg at 06/01/23 0815   budesonide (PULMICORT) nebulizer solution 0.5 mg  0.5 mg Nebulization BID Madelyn Flavors A, MD   0.5 mg at 06/01/23 0843   cefTRIAXone (ROCEPHIN) 2 g in sodium chloride 0.9 % 100 mL IVPB  2 g Intravenous Q24H Madelyn Flavors A, MD   Stopped at 05/31/23 2230   diltiazem (CARDIZEM) tablet 90 mg  90 mg Oral Q8H Leroy Sea, MD   90 mg at 06/01/23 1012   doxycycline (VIBRAMYCIN) 100 mg in sodium chloride 0.9 % 250 mL IVPB  100 mg Intravenous Q12H Smith, Rondell A, MD 125 mL/hr at 06/01/23 1031 100 mg at 06/01/23 1031   gabapentin (NEURONTIN) capsule 300 mg  300 mg Oral Daily Madelyn Flavors A, MD   300 mg at 06/01/23 0803   guaiFENesin (MUCINEX) 12 hr tablet 600 mg  600 mg Oral BID Madelyn Flavors A, MD   600 mg at 06/01/23 0803   insulin aspart (novoLOG) injection 0-5 Units  0-5 Units Subcutaneous QHS Madelyn Flavors A, MD   2 Units at 05/31/23 2141   insulin aspart (novoLOG) injection 0-9 Units  0-9 Units Subcutaneous TID WC Pham, Minh Q, RPH-CPP   2 Units at 06/01/23 6295   ipratropium (ATROVENT) nebulizer solution 0.5 mg  0.5 mg Nebulization Q6H Segars, Christiane Ha, MD   0.5 mg at 06/01/23 0842   levalbuterol (XOPENEX) nebulizer solution 0.63 mg  0.63 mg Nebulization Q6H Segars, Christiane Ha, MD   0.63 mg at 06/01/23 0842   magnesium sulfate IVPB 4 g 100 mL  4 g Intravenous Once Leroy Sea, MD 50 mL/hr at 06/01/23 1013 4 g at 06/01/23 1013   nicotine (NICODERM CQ - dosed in mg/24 hours) patch 21 mg  21 mg Transdermal Daily Smith, Rondell A, MD       oxybutynin (DITROPAN) tablet 5 mg  5 mg Oral BID Madelyn Flavors A, MD   5 mg at 06/01/23 0815   pantoprazole (PROTONIX) EC tablet  40 mg  40 mg Oral Daily Madelyn Flavors A, MD   40 mg at 06/01/23 0803   pravastatin (PRAVACHOL) tablet 40 mg  40 mg Oral q1800 Madelyn Flavors A, MD   40 mg at 05/31/23 1725   predniSONE (DELTASONE) tablet 40 mg  40 mg Oral Q breakfast Madelyn Flavors A, MD   40 mg at 06/01/23 8657   sodium chloride flush (NS) 0.9 % injection 3 mL  3 mL Intravenous Q12H Smith, Rondell A, MD   3 mL at 06/01/23 0805   trimethobenzamide (TIGAN) injection 200 mg  200 mg Intramuscular Q6H PRN Clydie Braun, MD       No Known Allergies Principal Problem:   Sepsis (HCC) Active Problems:   DM type 2 (diabetes mellitus, type 2) (HCC)   Essential hypertension   Cigarette smoker   Prolonged QT interval   GERD (gastroesophageal reflux disease)   CAP (community acquired pneumonia)   COPD exacerbation (HCC)   Acute respiratory failure with hypoxia (HCC)   Urinary tract infection   Renal insufficiency   History of lung  cancer   Left hip pain   Fall at home, initial encounter  Blood pressure (!) 161/69, pulse 69, temperature 98.4 F (36.9 C), temperature source Oral, resp. rate 17, height 5\' 7"  (1.702 m), weight 64 kg, SpO2 99%. Maryiah Olvey Levander Campion RN, BSN 06/01/2023

## 2023-06-01 NOTE — Plan of Care (Signed)

## 2023-06-01 NOTE — Plan of Care (Signed)
  Problem: Health Behavior/Discharge Planning: Goal: Ability to manage health-related needs will improve Outcome: Progressing   Problem: Clinical Measurements: Goal: Will remain free from infection Outcome: Progressing Goal: Respiratory complications will improve Outcome: Progressing   Problem: Activity: Goal: Risk for activity intolerance will decrease Outcome: Progressing   Problem: Coping: Goal: Level of anxiety will decrease Outcome: Progressing

## 2023-06-02 ENCOUNTER — Inpatient Hospital Stay (HOSPITAL_COMMUNITY)

## 2023-06-02 DIAGNOSIS — I4891 Unspecified atrial fibrillation: Secondary | ICD-10-CM | POA: Diagnosis not present

## 2023-06-02 DIAGNOSIS — A419 Sepsis, unspecified organism: Secondary | ICD-10-CM | POA: Diagnosis not present

## 2023-06-02 LAB — GLUCOSE, CAPILLARY
Glucose-Capillary: 175 mg/dL — ABNORMAL HIGH (ref 70–99)
Glucose-Capillary: 114 mg/dL — ABNORMAL HIGH (ref 70–99)
Glucose-Capillary: 175 mg/dL — ABNORMAL HIGH (ref 70–99)
Glucose-Capillary: 225 mg/dL — ABNORMAL HIGH (ref 70–99)

## 2023-06-02 LAB — BASIC METABOLIC PANEL
Anion gap: 9 (ref 5–15)
BUN: 19 mg/dL (ref 8–23)
CO2: 28 mmol/L (ref 22–32)
Calcium: 8.6 mg/dL — ABNORMAL LOW (ref 8.9–10.3)
Chloride: 100 mmol/L (ref 98–111)
Creatinine, Ser: 0.99 mg/dL (ref 0.44–1.00)
GFR, Estimated: >60 mL/min (ref >60)
Glucose, Bld: 128 mg/dL — ABNORMAL HIGH (ref 70–99)
Potassium: 3.3 mmol/L — ABNORMAL LOW (ref 3.5–5.1)
Sodium: 137 mmol/L (ref 135–145)

## 2023-06-02 LAB — CBC WITH DIFFERENTIAL/PLATELET
Abs Immature Granulocytes: 0.08 10*3/uL — ABNORMAL HIGH (ref 0.00–0.07)
Basophils Absolute: 0 10*3/uL (ref 0.0–0.1)
Basophils Relative: 0 %
Eosinophils Absolute: 0 10*3/uL (ref 0.0–0.5)
Eosinophils Relative: 0 %
HCT: 34.4 % — ABNORMAL LOW (ref 36.0–46.0)
Hemoglobin: 11.2 g/dL — ABNORMAL LOW (ref 12.0–15.0)
Immature Granulocytes: 1 %
Lymphocytes Relative: 16 %
Lymphs Abs: 1.8 10*3/uL (ref 0.7–4.0)
MCH: 32.6 pg (ref 26.0–34.0)
MCHC: 32.6 g/dL (ref 30.0–36.0)
MCV: 100 fL (ref 80.0–100.0)
Monocytes Absolute: 0.6 10*3/uL (ref 0.1–1.0)
Monocytes Relative: 5 %
Neutro Abs: 8.9 10*3/uL — ABNORMAL HIGH (ref 1.7–7.7)
Neutrophils Relative %: 78 %
Platelets: 226 10*3/uL (ref 150–400)
RBC: 3.44 MIL/uL — ABNORMAL LOW (ref 3.87–5.11)
RDW: 14.2 % (ref 11.5–15.5)
WBC: 11.4 10*3/uL — ABNORMAL HIGH (ref 4.0–10.5)
nRBC: 0 % (ref 0.0–0.2)

## 2023-06-02 LAB — ECHOCARDIOGRAM COMPLETE
AR max vel: 2.21 cm2
AV Area VTI: 2.05 cm2
AV Area mean vel: 2.05 cm2
AV Mean grad: 3 mmHg
AV Peak grad: 6.6 mmHg
Ao pk vel: 1.28 m/s
Area-P 1/2: 3.27 cm2
Calc EF: 61.7 %
Height: 67 in
MV VTI: 1.84 cm2
S' Lateral: 3.5 cm
Single Plane A2C EF: 58.5 %
Single Plane A4C EF: 63.2 %
Weight: 2257.51 [oz_av]

## 2023-06-02 LAB — MAGNESIUM: Magnesium: 2 mg/dL (ref 1.7–2.4)

## 2023-06-02 LAB — C-REACTIVE PROTEIN: CRP: 3.2 mg/dL — ABNORMAL HIGH (ref <1.0)

## 2023-06-02 LAB — PROCALCITONIN: Procalcitonin: 0.18 ng/mL

## 2023-06-02 LAB — BRAIN NATRIURETIC PEPTIDE: B Natriuretic Peptide: 466 pg/mL — ABNORMAL HIGH (ref 0.0–100.0)

## 2023-06-02 MED ORDER — FUROSEMIDE 10 MG/ML IJ SOLN
60.0000 mg | Freq: Once | INTRAMUSCULAR | Status: AC
Start: 2023-06-02 — End: 2023-06-02
  Administered 2023-06-02: 60 mg via INTRAVENOUS
  Filled 2023-06-02: qty 6

## 2023-06-02 MED ORDER — HYDRALAZINE HCL 20 MG/ML IJ SOLN
10.0000 mg | Freq: Four times a day (QID) | INTRAMUSCULAR | Status: DC | PRN
  Administered 2023-06-03: 10 mg via INTRAVENOUS
  Filled 2023-06-02: qty 1

## 2023-06-02 MED ORDER — POTASSIUM CHLORIDE CRYS ER 20 MEQ PO TBCR
40.0000 meq | EXTENDED_RELEASE_TABLET | Freq: Once | ORAL | Status: AC
  Administered 2023-06-02: 40 meq via ORAL
  Filled 2023-06-02: qty 2

## 2023-06-02 MED ORDER — POTASSIUM CHLORIDE CRYS ER 20 MEQ PO TBCR
40.0000 meq | EXTENDED_RELEASE_TABLET | Freq: Two times a day (BID) | ORAL | Status: AC
  Administered 2023-06-02 (×2): 40 meq via ORAL
  Filled 2023-06-02: qty 2
  Filled 2023-06-02: qty 4

## 2023-06-02 MED ORDER — PERFLUTREN LIPID MICROSPHERE
1.0000 mL | INTRAVENOUS | Status: AC | PRN
  Administered 2023-06-02: 3 mL via INTRAVENOUS

## 2023-06-02 MED ORDER — AMLODIPINE BESYLATE 10 MG PO TABS
10.0000 mg | ORAL_TABLET | Freq: Every day | ORAL | Status: DC
  Administered 2023-06-02 – 2023-06-06 (×5): 10 mg via ORAL
  Filled 2023-06-02 (×6): qty 1

## 2023-06-02 MED ORDER — BISOPROLOL FUMARATE 10 MG PO TABS
10.0000 mg | ORAL_TABLET | Freq: Every day | ORAL | Status: DC
  Administered 2023-06-02 – 2023-06-06 (×5): 10 mg via ORAL
  Filled 2023-06-02 (×5): qty 1

## 2023-06-02 NOTE — Progress Notes (Signed)
 Physical Therapy Treatment Patient Details Name: Holly May MRN: [id number] DOB: Feb 20, 1951 Today's Date: 06/02/2023   History of Present Illness 73 y.o. female presents 05/31/23 with worsening cough and chills.  Sepsis 2/2 UTI; PMH significant of hypertension, paroxysmal atrial fibrillation, PAF, severe COPD, diabetes mellitus type 2, lung adenocarcinoma on radiation therapy, tobacco abuse    PT Comments  Progressing well towards acute goals. Ambulates 150 feet with rollator today. SpO2 96% on 3L, 91% on 2L. No overt LOB however demonstrates a couple episodes of knees lightly buckling which she is able to self correct. Educated on safety, awareness, proximity to device. A little impulsive with transfers, negligent of lines/leads despite cues. Supervision for safety. Patient will continue to benefit from skilled physical therapy services to further improve independence with functional mobility.    If plan is discharge home, recommend the following: A little help with walking and/or transfers;Assistance with cooking/housework;Help with stairs or ramp for entrance   Can travel by private vehicle        Equipment Recommendations  Rollator (4 wheels)    Recommendations for Other Services       Precautions / Restrictions Precautions Precautions: Fall;Other (comment) Recall of Precautions/Restrictions: Intact Precaution/Restrictions Comments: monitor O2 sats Restrictions Weight Bearing Restrictions Per Provider Order: No     Mobility  Bed Mobility               General bed mobility comments: up in recliner    Transfers Overall transfer level: Needs assistance Equipment used: Rollator (4 wheels), None Transfers: Sit to/from Stand, Bed to chair/wheelchair/BSC Sit to Stand: Supervision   Step pivot transfers: Supervision       General transfer comment: Supervision for safety, performed with and without 4WW. Educated on locking breaks between transitions. No overt LOB,  impulsive to stand and transfer to [facility] despite cues to be mindful of lines/leads and allow therapist to assist.    Ambulation/Gait Ambulation/Gait assistance: Supervision Gait Distance (Feet): 150 Feet Assistive device: Rollator (4 wheels) Gait Pattern/deviations: Step-through pattern, Decreased stride length, Antalgic, Drifts right/left, Knees buckling Gait velocity: dec Gait velocity interpretation: <1.8 ft/sec, indicate of risk for recurrent falls   General Gait Details: Improved RW control, steady pace and manages around congested areas without overt LOB. Pt has 2 episodes of knees buckling lightly. She is able to self correct. Educated on appropriate proximity to [facility] for adequate support and awareness. Supervision for safety. on 2L supplemental O2 91% SpO2, and 96% on 2L.   Stairs             Wheelchair Mobility     Tilt Bed    Modified Rankin (Stroke Patients Only)       Balance Overall balance assessment: Needs assistance Sitting-balance support: No upper extremity supported, Feet supported Sitting balance-Leahy Scale: Good     Standing balance support: No upper extremity supported Standing balance-Leahy Scale: Fair Standing balance comment: More stable with support.                            Communication Communication Communication: No apparent difficulties  Cognition Arousal: Alert Behavior During Therapy: WFL for tasks assessed/performed, Impulsive   PT - Cognitive impairments: Safety/Judgement                       PT - Cognition Comments: A little impulsive, negligent of lines/leads when transferring, requires cues. Following commands: Intact      Cueing  Cueing Techniques: Verbal cues  Exercises      General Comments General comments (skin integrity, edema, etc.): SpO2 91% on 2L ambulating, 96% on 3L.      Pertinent Vitals/Pain Pain Assessment Pain Assessment: No/denies pain    Home Living                           Prior Function            PT Goals (current goals can now be found in the care plan section) Acute Rehab PT Goals Patient Stated Goal: wants to get a small portable oxygen tank that she can manage PT Goal Formulation: With patient Time For Goal Achievement: 06/15/23 Potential to Achieve Goals: Good Progress towards PT goals: Progressing toward goals    Frequency    Min 1X/week      PT Plan      Co-evaluation              AM-PAC PT "6 Clicks" Mobility   Outcome Measure  Help needed turning from your back to your side while in a flat bed without using bedrails?: None Help needed moving from lying on your back to sitting on the side of a flat bed without using bedrails?: A Little Help needed moving to and from a bed to a chair (including a wheelchair)?: A Little Help needed standing up from a chair using your arms (e.g., wheelchair or bedside chair)?: A Little Help needed to walk in hospital room?: A Little Help needed climbing 3-5 steps with a railing? : A Little 6 Click Score: 19    End of Session Equipment Utilized During Treatment: Oxygen Activity Tolerance: Patient tolerated treatment well Patient left: in chair;with call bell/phone within reach Nurse Communication: Mobility status PT Visit Diagnosis: Unsteadiness on feet (R26.81)     Time: 0981-1914 PT Time Calculation (min) (ACUTE ONLY): 17 min  Charges:    $Gait Training: 8-22 mins PT General Charges $$ ACUTE PT VISIT: 1 Visit                     Kathlyn Sacramento, PT, DPT [facility]  Rehabilitation Services Physical Therapist Office: [phone] Website: [email or web address]    Berton Mount 06/02/2023, 1:15 PM

## 2023-06-02 NOTE — Progress Notes (Signed)
 Patient has increase the work of breathing while ambulating to bathroom with 2LNC , So, bump to 8L HFNC, takes around  5-10 min to settle down. Thus, kept @ 3L HFNC for now.

## 2023-06-02 NOTE — Progress Notes (Signed)
 Echocardiogram 2D Echocardiogram has been performed.  Holly May Holly May 06/02/2023, 10:09 AM

## 2023-06-02 NOTE — Plan of Care (Signed)
  Problem: Health Behavior/Discharge Planning: Goal: Ability to manage health-related needs will improve Outcome: Progressing   Problem: Clinical Measurements: Goal: Ability to maintain clinical measurements within normal limits will improve Outcome: Progressing Goal: Will remain free from infection Outcome: Progressing Goal: Respiratory complications will improve Outcome: Progressing   Problem: Activity: Goal: Risk for activity intolerance will decrease Outcome: Progressing   

## 2023-06-02 NOTE — Progress Notes (Signed)
 PROGRESS NOTE                                                                                                                                                                                                             Patient Demographics:    Holly May, is a 73 y.o. female, DOB - 05-May-1950, ZOX:[id number]  Outpatient Primary MD for the patient is Pcp, No    LOS - 2  Admit date - 05/30/2023    Chief Complaint  Patient presents with   Chest Pain       Brief Narrative (HPI from H&P)   73 y.o. female with medical history significant of hypertension, paroxysmal atrial fibrillation, PAF, severe COPD, diabetes mellitus type 2, lung adenocarcinoma on radiation therapy, tobacco abuse presents with worsening cough and chills.  [name] with sepsis due to combination of UTI and community-acquired pneumonia, COPD exacerbation along with atrial fibrillation with RVR.   Subjective:   Patient in bed, appears comfortable, denies any headache, no fever, no chest pain or pressure, no shortness of breath , no abdominal pain. No new focal weakness.   Assessment  & Plan :    Sepsis secondary to urinary tract infection along with community-acquired pneumonia with COPD exacerbation, acute hypoxic respiratory failure due to community-acquired pneumonia and COPD exacerbation.  Abscess present on admission has resolved, she has been placed on appropriate IV antibiotics which will be continued, low-dose steroids for COPD exacerbation, follow cultures, monitor bladder scans, continue nebulizer treatments, encouraged to sit in chair use I-S and flutter valve for pulmonary toiletry, advance activity and titrate down oxygen.  Strictly counseled to quit smoking.  Initial viral screen negative.  Monitor inflammatory markers and cultures.  SpO2: 96 % O2 Flow Rate (L/min): 3 L/min     Renal insufficiency vs. acute kidney injury On admission creatinine  noted to be 1.2 with BUN 25.  Baseline creatinine previously noted to be 0.88-0.95.  Currently stable will continue to monitor.   Prolonged QT interval Acute.  QTc noted to be prolonged at 512.  Replace electrolytes, Celexa and mirtazapine held, QTc stable on repeat EKG 06/01/2022 at 470 ms, echo pending   Essential hypertension On low-dose beta-blocker now on oral Cardizem along with as needed IV hydralazine.  Monitor and adjust.   Hypomagnesemia.  Replaced.    Paroxysmal atrial fibrillation on  chronic anticoagulation Went in RVR night of 05/31/2023, required Cardizem drip, on low-dose oral beta-blocker which will be continued, switch IV Cardizem to oral, TSH mildly suppressed likely sick euthyroid, free T4 stable, pending echo, continue Eliquis.   History of lung cancer with evidence of lung nodules on CT scan. Patient status post radiation treatments. -Continue outpatient follow-up with oncology and pulmonary outpatient to be arranged by PCP.   Left hip pain secondary to fall Prior to arrival patient reported missing a step and falling landing on her left hip.  On physical exam significant bruising around the left hip.  X-ray of the pelvis negative, pain has improved, PT and monitor.   Hyperlipidemia -Continue pharmacy substitution of pravastatin for home lovastatin   GERD -Pharmacy substitution of Protonix   Tobacco abuse Patient reports still smoking.  Consulted to quit. -Nicotine patch offered   Controlled diabetes mellitus type 2, without long-term use of insulin On admission glucose noted to be around 200.  Last available hemoglobin A1c was noted to be 6.1 when checked over 4 years ago. -Hypoglycemic protocols -CBG before every meal with sensitive SSI -Adjust insulin regimen as needed      Condition - Extremely Guarded  Family Communication  :   None  Code Status :  Full  Consults  :  None  PUD Prophylaxis : PPI   Procedures  :     TTE  CTA - Stable left upper  lobe spiculated nodule consistent with the given clinical history. Nodular density in the right lower lobe measuring up to 15 mm. This is new from the prior exam but given the adjacent mucous plugging and inflammatory change this may be inflammatory in nature. Short-term follow-up is recommended. Neoplasm is not excluded on the basis of this exam. Small patchy ground-glass opacities in the right upper lobe likely postinflammatory in nature. No evidence of pulmonary embolism. Aortic Atherosclerosis (ICD10-I70.0) and Emphysema (ICD10-J43.9)      Disposition Plan  :    Status is: Inpatient    DVT Prophylaxis  :     apixaban (ELIQUIS) tablet 5 mg     Lab Results  Component Value Date   PLT 226 06/02/2023    Diet :  Diet Order             Diet Heart Room service appropriate? Yes; Fluid consistency: Thin  Diet effective now                    Inpatient Medications  Scheduled Meds:  amLODipine  10 mg Oral Daily   apixaban  5 mg Oral BID   arformoterol  15 mcg Nebulization BID   bisoprolol  10 mg Oral Daily   budesonide (PULMICORT) nebulizer solution  0.5 mg Nebulization BID   diltiazem  90 mg Oral Q8H   furosemide  60 mg Intravenous Once   gabapentin  300 mg Oral Daily   gabapentin  300 mg Oral QHS   guaiFENesin  600 mg Oral BID   insulin aspart  0-5 Units Subcutaneous QHS   insulin aspart  0-9 Units Subcutaneous TID WC   nicotine  21 mg Transdermal Daily   oxybutynin  5 mg Oral BID   pantoprazole  40 mg Oral Daily   potassium chloride  40 mEq Oral BID   pravastatin  40 mg Oral q1800   predniSONE  40 mg Oral Q breakfast   sodium chloride flush  3 mL Intravenous Q12H   Continuous Infusions:  cefTRIAXone (ROCEPHIN)  IV Stopped (06/01/23 2230)   doxycycline (VIBRAMYCIN) IV Stopped (06/01/23 2200)   PRN Meds:.acetaminophen **OR** acetaminophen, hydrALAZINE, levalbuterol, trimethobenzamide    Objective:   Vitals:   06/02/23 0445 06/02/23 0625 06/02/23 0755  06/02/23 0900  BP: (!) 172/79 (!) 162/97  (!) 145/74  Pulse:    (!) 59  Resp:    18  Temp:    97.7 F (36.5 C)  TempSrc:    Oral  SpO2:  93% 94% 96%  Weight:      Height:        Wt Readings from Last 3 Encounters:  05/30/23 64 kg  03/18/23 64 kg  02/11/23 63.5 kg     Intake/Output Summary (Last 24 hours) at 06/02/2023 0932 Last data filed at 06/02/2023 0900 Gross per 24 hour  Intake 363 ml  Output --  Net 363 ml     Physical Exam  Awake Alert, No new F.N deficits, Normal affect [place].AT,PERRAL Supple Neck, No JVD,   Symmetrical Chest wall movement, Good air movement bilaterally, coarse bilateral breath sounds with bibasilar crackles RRR,No Gallops,Rubs or new Murmurs,  +ve B.Sounds, Abd Soft, No tenderness,   No Cyanosis, Clubbing or edema       Data Review:    Recent Labs  Lab 05/30/23 1944 06/01/23 0611 06/02/23 0452  WBC 11.1* 7.7 11.4*  HGB 13.5 11.0* 11.2*  HCT 40.2 33.8* 34.4*  PLT 263 204 226  MCV 98.5 101.5* 100.0  MCH 33.1 33.0 32.6  MCHC 33.6 32.5 32.6  RDW 14.4 14.1 14.2  LYMPHSABS  --   --  1.8  MONOABS  --   --  0.6  EOSABS  --   --  0.0  BASOSABS  --   --  0.0    Recent Labs  Lab 05/30/23 1944 05/31/23 0014 05/31/23 0026 05/31/23 0349 05/31/23 1445 05/31/23 1828 06/01/23 0611 06/02/23 0452  NA 137  --   --   --   --   --  139 137  K 3.9  --   --   --   --   --  3.7 3.3*  CL 93*  --   --   --   --   --  95* 100  CO2 26  --   --   --   --   --  28 28  ANIONGAP 18*  --   --   --   --   --  16* 9  GLUCOSE 207*  --   --   --   --   --  161* 128*  BUN 25*  --   --   --   --   --  18 19  CREATININE 1.20*  --   --   --   --   --  1.16* 0.99  CRP  --   --   --   --   --   --  7.0* 3.2*  PROCALCITON  --   --   --   --  0.17  --  0.25 0.18  LATICACIDVEN  --   --  3.7* 1.8  --   --   --   --   TSH  --   --   --   --   --   --  0.294*  --   HGBA1C  --   --   --   --   --  6.0*  --   --   BNP  --  283.6*  --   --   --   --  820.0* 466.0*   MG  --   --   --   --   --   --  1.6* 2.0  CALCIUM 9.2  --   --   --   --   --  8.8* 8.6*      Recent Labs  Lab 05/30/23 1944 05/31/23 0014 05/31/23 0026 05/31/23 0349 05/31/23 1445 05/31/23 1828 06/01/23 0611 06/02/23 0452  CRP  --   --   --   --   --   --  7.0* 3.2*  PROCALCITON  --   --   --   --  0.17  --  0.25 0.18  LATICACIDVEN  --   --  3.7* 1.8  --   --   --   --   TSH  --   --   --   --   --   --  0.294*  --   HGBA1C  --   --   --   --   --  6.0*  --   --   BNP  --  283.6*  --   --   --   --  820.0* 466.0*  MG  --   --   --   --   --   --  1.6* 2.0  CALCIUM 9.2  --   --   --   --   --  8.8* 8.6*    --------------------------------------------------------------------------------------------------------------- No results found for: "CHOL", "HDL", "LDLCALC", "LDLDIRECT", "TRIG", "CHOLHDL"  Lab Results  Component Value Date   HGBA1C 6.0 (H) 05/31/2023   Recent Labs    06/01/23 0611  TSH 0.294*  FREET4 0.66   No results for input(s): "VITAMINB12", "FOLATE", "FERRITIN", "TIBC", "IRON", "RETICCTPCT" in the last 72 hours. ------------------------------------------------------------------------------------------------------------------ Cardiac Enzymes No results for input(s): "CKMB", "TROPONINI", "MYOGLOBIN" in the last 168 hours.  Invalid input(s): "CK"  Micro Results Recent Results (from the past 240 hours)  Culture, blood (Routine X 2) w Reflex to ID Panel     Status: None (Preliminary result)   Collection Time: 05/31/23 12:14 AM   Specimen: BLOOD  Result Value Ref Range Status   Specimen Description BLOOD SITE NOT SPECIFIED  Final   Special Requests   Final    BOTTLES DRAWN AEROBIC AND ANAEROBIC Blood Culture adequate volume   Culture   Final    NO GROWTH 1 DAY Performed at [facility], [address] [place], [place] [address]    Report Status PENDING  Incomplete  Culture, blood (Routine X 2) w Reflex to ID Panel     Status: None (Preliminary  result)   Collection Time: 05/31/23 12:14 AM   Specimen: BLOOD  Result Value Ref Range Status   Specimen Description BLOOD SITE NOT SPECIFIED  Final   Special Requests   Final    BOTTLES DRAWN AEROBIC AND ANAEROBIC Blood Culture results may not be optimal due to an inadequate volume of blood received in culture bottles   Culture   Final    NO GROWTH 1 DAY Performed at [facility], [address], [place], [place] [address]    Report Status PENDING  Incomplete  Resp panel by RT-PCR (RSV, Flu A&B, Covid)     Status: None   Collection Time: 05/31/23 12:14 AM   Specimen: Nasal Swab  Result Value Ref Range Status   SARS Coronavirus 2 by RT PCR NEGATIVE  NEGATIVE Final   Influenza A by PCR NEGATIVE NEGATIVE Final   Influenza B by PCR NEGATIVE NEGATIVE Final    Comment: (NOTE) The Xpert Xpress SARS-CoV-2/FLU/RSV plus assay is intended as an aid in the diagnosis of influenza from Nasopharyngeal swab specimens and should not be used as a sole basis for treatment. Nasal washings and aspirates are unacceptable for Xpert Xpress SARS-CoV-2/FLU/RSV testing.  Fact Sheet for Patients: BloggerCourse.com  Fact Sheet for Healthcare Providers: SeriousBroker.it  This test is not yet approved or cleared by the [place] FDA and has been authorized for detection and/or diagnosis of SARS-CoV-2 by FDA under an Emergency Use Authorization (EUA). This EUA will remain in effect (meaning this test can be used) for the duration of the COVID-19 declaration under Section 564(b)(1) of the Act, 21 U.S.C. section 360bbb-3(b)(1), unless the authorization is terminated or revoked.     Resp Syncytial Virus by PCR NEGATIVE NEGATIVE Final    Comment: (NOTE) Fact Sheet for Patients: BloggerCourse.com  Fact Sheet for Healthcare Providers: SeriousBroker.it  This test is not yet approved or cleared by the  [place] FDA and has been authorized for detection and/or diagnosis of SARS-CoV-2 by FDA under an Emergency Use Authorization (EUA). This EUA will remain in effect (meaning this test can be used) for the duration of the COVID-19 declaration under Section 564(b)(1) of the Act, 21 U.S.C. section 360bbb-3(b)(1), unless the authorization is terminated or revoked.  Performed at [facility], [address], [place], [place] [address]   Urine Culture     Status: Abnormal   Collection Time: 05/31/23 12:51 AM   Specimen: Urine, Random  Result Value Ref Range Status   Specimen Description URINE, RANDOM  Final   Special Requests   Final    NONE Reflexed from [id number] Performed at [facility], [address] [place], [place] [address]    Culture MULTIPLE SPECIES PRESENT, SUGGEST RECOLLECTION (A)  Final   Report Status 06/01/2023 FINAL  Final    Radiology Report DG Chest Port 1 View Result Date: 06/01/2023 CLINICAL DATA:  Shortness of breath EXAM: PORTABLE CHEST 1 VIEW COMPARISON:  05/30/2023 FINDINGS: Linear and nodular opacity in the left lung with fiducial marker, stable and known. Volume loss and indistinct streaky density at the right lung base. No edema, effusion, or pneumothorax. Normal heart size. Prior median sternotomy. IMPRESSION: Mild infiltrate at the right lung base where there was airway impaction/opacification by most recent CT. Electronically Signed   By: Tiburcio Pea M.D.   On: 06/01/2023 07:11   DG Pelvis 1-2 Views Result Date: 05/31/2023 CLINICAL DATA:  Pain after fall. EXAM: PELVIS - 1-2 VIEW COMPARISON:  07/28/2021 FINDINGS: The cortical margins of the bony pelvis are intact. No fracture. Pubic symphysis and sacroiliac joints are congruent. Both femoral heads are well-seated in the respective acetabula. Calcified uterine fibroids. IMPRESSION: No pelvic fracture. Electronically Signed   By: Narda Rutherford M.D.   On: 05/31/2023 20:21     Signature  -    Susa Raring M.D on 06/02/2023 at 9:32 AM   -  To page go to [email or web address]

## 2023-06-02 NOTE — TOC CM/SW Note (Signed)
 Transition of Care [facility]) - Inpatient Brief Assessment   Patient Details  Name: Holly May MRN: [id number] Date of Birth: [birth year]  Transition of Care [facility]) CM/SW Contact:    Mearl Latin, LCSW Phone Number: 06/02/2023, 9:37 AM   Clinical Narrative: Patient admitted from home alone. [facility] provided to address SDOH needs. TOC following for home health set up.   Transition of Care Asessment: Insurance and Status: Insurance coverage has been reviewed   Home environment has been reviewed: From home Prior level of function:: Independent Prior/Current Home Services: No current home services Social Drivers of Health Review: SDOH reviewed interventions complete Readmission risk has been reviewed: Yes Transition of care needs: transition of care needs identified, TOC will continue to follow

## 2023-06-02 NOTE — Plan of Care (Signed)

## 2023-06-02 NOTE — Discharge Instructions (Signed)
 [employer] such as food meal delivery: -[employer]: Phone: [phone],                         Physical Address: [address], [place], [place] [address] Hours:  M-F  8:00am - 5:00pm

## 2023-06-03 ENCOUNTER — Inpatient Hospital Stay (HOSPITAL_COMMUNITY)

## 2023-06-03 DIAGNOSIS — A419 Sepsis, unspecified organism: Secondary | ICD-10-CM | POA: Diagnosis not present

## 2023-06-03 LAB — CBC WITH DIFFERENTIAL/PLATELET
Abs Immature Granulocytes: 0.05 10*3/uL (ref 0.00–0.07)
Basophils Absolute: 0 10*3/uL (ref 0.0–0.1)
Basophils Relative: 0 %
Eosinophils Absolute: 0 10*3/uL (ref 0.0–0.5)
Eosinophils Relative: 0 %
HCT: 34.6 % — ABNORMAL LOW (ref 36.0–46.0)
Hemoglobin: 11.4 g/dL — ABNORMAL LOW (ref 12.0–15.0)
Immature Granulocytes: 1 %
Lymphocytes Relative: 16 %
Lymphs Abs: 1.8 10*3/uL (ref 0.7–4.0)
MCH: 33.1 pg (ref 26.0–34.0)
MCHC: 32.9 g/dL (ref 30.0–36.0)
MCV: 100.6 fL — ABNORMAL HIGH (ref 80.0–100.0)
Monocytes Absolute: 0.8 10*3/uL (ref 0.1–1.0)
Monocytes Relative: 7 %
Neutro Abs: 8.4 10*3/uL — ABNORMAL HIGH (ref 1.7–7.7)
Neutrophils Relative %: 76 %
Platelets: 249 10*3/uL (ref 150–400)
RBC: 3.44 MIL/uL — ABNORMAL LOW (ref 3.87–5.11)
RDW: 14.1 % (ref 11.5–15.5)
WBC: 11.1 10*3/uL — ABNORMAL HIGH (ref 4.0–10.5)
nRBC: 0 % (ref 0.0–0.2)

## 2023-06-03 LAB — BASIC METABOLIC PANEL
Anion gap: 11 (ref 5–15)
BUN: 24 mg/dL — ABNORMAL HIGH (ref 8–23)
CO2: 24 mmol/L (ref 22–32)
Calcium: 8.4 mg/dL — ABNORMAL LOW (ref 8.9–10.3)
Chloride: 101 mmol/L (ref 98–111)
Creatinine, Ser: 0.99 mg/dL (ref 0.44–1.00)
GFR, Estimated: >60 mL/min (ref >60)
Glucose, Bld: 129 mg/dL — ABNORMAL HIGH (ref 70–99)
Potassium: 4.9 mmol/L (ref 3.5–5.1)
Sodium: 136 mmol/L (ref 135–145)

## 2023-06-03 LAB — BRAIN NATRIURETIC PEPTIDE: B Natriuretic Peptide: 438.9 pg/mL — ABNORMAL HIGH (ref 0.0–100.0)

## 2023-06-03 LAB — MAGNESIUM: Magnesium: 1.7 mg/dL (ref 1.7–2.4)

## 2023-06-03 LAB — GLUCOSE, CAPILLARY
Glucose-Capillary: 126 mg/dL — ABNORMAL HIGH (ref 70–99)
Glucose-Capillary: 247 mg/dL — ABNORMAL HIGH (ref 70–99)
Glucose-Capillary: 229 mg/dL — ABNORMAL HIGH (ref 70–99)
Glucose-Capillary: 283 mg/dL — ABNORMAL HIGH (ref 70–99)

## 2023-06-03 LAB — PROCALCITONIN: Procalcitonin: <0.1 ng/mL

## 2023-06-03 LAB — C-REACTIVE PROTEIN: CRP: 2.1 mg/dL — ABNORMAL HIGH (ref <1.0)

## 2023-06-03 MED ORDER — METHYLPREDNISOLONE SODIUM SUCC 125 MG IJ SOLR
60.0000 mg | INTRAMUSCULAR | Status: DC
  Administered 2023-06-03 – 2023-06-06 (×4): 60 mg via INTRAVENOUS
  Filled 2023-06-03 (×4): qty 2

## 2023-06-03 MED ORDER — IPRATROPIUM-ALBUTEROL 0.5-2.5 (3) MG/3ML IN SOLN
3.0000 mL | Freq: Four times a day (QID) | RESPIRATORY_TRACT | Status: DC
  Administered 2023-06-03 – 2023-06-06 (×12): 3 mL via RESPIRATORY_TRACT
  Filled 2023-06-03 (×12): qty 3

## 2023-06-03 MED ORDER — MAGNESIUM SULFATE 2 GM/50ML IV SOLN
2.0000 g | Freq: Once | INTRAVENOUS | Status: AC
  Administered 2023-06-03: 2 g via INTRAVENOUS
  Filled 2023-06-03: qty 50

## 2023-06-03 MED ORDER — METOLAZONE 2.5 MG PO TABS
2.5000 mg | ORAL_TABLET | Freq: Once | ORAL | Status: AC
  Administered 2023-06-03: 2.5 mg via ORAL
  Filled 2023-06-03: qty 1

## 2023-06-03 MED ORDER — BISACODYL 5 MG PO TBEC
10.0000 mg | DELAYED_RELEASE_TABLET | Freq: Once | ORAL | Status: AC
  Administered 2023-06-03: 10 mg via ORAL
  Filled 2023-06-03: qty 2

## 2023-06-03 MED ORDER — FUROSEMIDE 10 MG/ML IJ SOLN
80.0000 mg | Freq: Once | INTRAMUSCULAR | Status: AC
  Administered 2023-06-03: 80 mg via INTRAVENOUS
  Filled 2023-06-03: qty 8

## 2023-06-03 MED ORDER — DOCUSATE SODIUM 100 MG PO CAPS
200.0000 mg | ORAL_CAPSULE | Freq: Two times a day (BID) | ORAL | Status: DC
  Administered 2023-06-03 – 2023-06-06 (×6): 200 mg via ORAL
  Filled 2023-06-03 (×6): qty 2

## 2023-06-03 NOTE — Progress Notes (Signed)
 Physical Therapy Treatment Patient Details Name: Holly May MRN: [id number] DOB: 1950/11/16 Today's Date: 06/03/2023   History of Present Illness 73 y.o. female presents 05/31/23 with worsening cough and chills.  Sepsis 2/2 UTI; PMH significant of hypertension, paroxysmal atrial fibrillation, PAF, severe COPD, diabetes mellitus type 2, lung adenocarcinoma on radiation therapy, tobacco abuse    PT Comments  SpO2 a little lower today with mobility, sounds a bit more dyspneic as well - SpO2 91% on 3L (96% yesterday while ambulating.) She was able to participate in stair training similar to home set-up without rails. Min assist with hand held support, cues for technique/sequencing. States she will have her sister and nephew assist her in/out of house as needed. Requested OT consult. Gait up to 175' today on 3L. No overt  LOB, one episode of Rt knee buckling, but states this is a chronic issue and manages safely. Patient will continue to benefit from skilled physical therapy services to further improve independence with functional mobility.     If plan is discharge home, recommend the following: Assistance with cooking/housework;Help with stairs or ramp for entrance   Can travel by private vehicle        Equipment Recommendations  Rollator (4 wheels)    Recommendations for Other Services       Precautions / Restrictions Precautions Precautions: Fall;Other (comment) Recall of Precautions/Restrictions: Intact Precaution/Restrictions Comments: monitor O2 sats Restrictions Weight Bearing Restrictions Per Provider Order: No     Mobility  Bed Mobility               General bed mobility comments: up in recliner    Transfers Overall transfer level: Needs assistance Equipment used: Rollator (4 wheels), None Transfers: Sit to/from Stand, Bed to chair/wheelchair/BSC Sit to Stand: Supervision           General transfer comment: Supervision for safety, still requires cues for  set-up and brake application between transitions with sit<>stand. Further cues for pulling rollator back prior to sitting as she pushes away before reaching chair.    Ambulation/Gait Ambulation/Gait assistance: Supervision Gait Distance (Feet): 175 Feet Assistive device: Rollator (4 wheels) Gait Pattern/deviations: Step-through pattern, Decreased stride length, Antalgic, Drifts right/left, Knees buckling Gait velocity: dec Gait velocity interpretation: <1.8 ft/sec, indicate of risk for recurrent falls   General Gait Details: Good control with rollator, still with minor drift, only one episode of Rt knee buckle (pt reports chronic issue) and she was again able to self correct. Reviewed safety and awareness with this device. Moderate dyspnea throughout session SpO2 91% on 3L. Did not require standing breaks today.   Stairs Stairs: Yes Stairs assistance: Min assist Stair Management: No rails, Step to pattern, Forwards Number of Stairs: 2 ((limited by lines/leads)) General stair comments: Set up similar to home enviornment without rails. Educated extensively on sequencing due to hx of Rt knee buckling. Leads up with LLE, descends with RLE but needed cues to recall. Min assist for hand held stability. She states nephew and sister available to help in/out when needed.   Wheelchair Mobility     Tilt Bed    Modified Rankin (Stroke Patients Only)       Balance Overall balance assessment: Needs assistance Sitting-balance support: No upper extremity supported, Feet supported Sitting balance-Leahy Scale: Good     Standing balance support: No upper extremity supported Standing balance-Leahy Scale: Fair Standing balance comment: More stable with support.  Communication Communication Communication: No apparent difficulties  Cognition Arousal: Alert Behavior During Therapy: WFL for tasks assessed/performed   PT - Cognitive impairments:  Safety/Judgement, No family/caregiver present to determine baseline                         Following commands: Intact      Cueing Cueing Techniques: Verbal cues  Exercises      General Comments General comments (skin integrity, edema, etc.): SpO2 91% on 3L while ambulating.      Pertinent Vitals/Pain Pain Assessment Pain Assessment: No/denies pain    Home Living                          Prior Function            PT Goals (current goals can now be found in the care plan section) Acute Rehab PT Goals Patient Stated Goal: wants to get a small portable oxygen tank that she can manage PT Goal Formulation: With patient Time For Goal Achievement: 06/15/23 Potential to Achieve Goals: Good Progress towards PT goals: Progressing toward goals    Frequency    Min 1X/week      PT Plan      Co-evaluation              AM-PAC PT "6 Clicks" Mobility   Outcome Measure  Help needed turning from your back to your side while in a flat bed without using bedrails?: None Help needed moving from lying on your back to sitting on the side of a flat bed without using bedrails?: A Little Help needed moving to and from a bed to a chair (including a wheelchair)?: A Little Help needed standing up from a chair using your arms (e.g., wheelchair or bedside chair)?: A Little Help needed to walk in hospital room?: A Little Help needed climbing 3-5 steps with a railing? : A Little 6 Click Score: 19    End of Session Equipment Utilized During Treatment: Oxygen Activity Tolerance: Patient tolerated treatment well Patient left: in chair;with call bell/phone within reach (CM in room) Nurse Communication: Mobility status PT Visit Diagnosis: Unsteadiness on feet (R26.81)     Time: 1191-4782 PT Time Calculation (min) (ACUTE ONLY): 26 min  Charges:    $Gait Training: 23-37 mins PT General Charges $$ ACUTE PT VISIT: 1 Visit                     Kathlyn Sacramento, PT,  DPT [facility]  Rehabilitation Services Physical Therapist Office: [phone] Website: [email or web address]    Berton Mount 06/03/2023, 9:55 AM

## 2023-06-03 NOTE — Progress Notes (Signed)
 PROGRESS NOTE                                                                                                                                                                                                             Patient Demographics:    Holly May, is a 73 y.o. female, DOB - 17-Jun-1950, ZOX:[id number]  Outpatient Primary MD for the patient is Pcp, No    LOS - 3  Admit date - 05/30/2023    Chief Complaint  Patient presents with   Chest Pain       Brief Narrative (HPI from H&P)   73 y.o. female with medical history significant of hypertension, paroxysmal atrial fibrillation, PAF, severe COPD, diabetes mellitus type 2, lung adenocarcinoma on radiation therapy, tobacco abuse presents with worsening cough and chills.  [name] with sepsis due to combination of UTI and community-acquired pneumonia, COPD exacerbation, acute on chronic diastolic CHF along with atrial fibrillation with RVR.   Subjective:   Patient in bed, appears comfortable, denies any headache, no fever, no chest pain or pressure, no shortness of breath , no abdominal pain. No new focal weakness.   Assessment  & Plan :    Sepsis secondary to urinary tract infection along with community-acquired pneumonia with COPD exacerbation, acute hypoxic respiratory failure due to community-acquired pneumonia and COPD exacerbation and a large component of acute on chronic diastolic CHF.  Abscess present on admission has resolved, she has been placed on appropriate IV antibiotics which will be continued, low-dose steroids for COPD exacerbation, follow cultures, monitor bladder scans, continue nebulizer treatments, encouraged to sit in chair use I-S and flutter valve for pulmonary toiletry, advance activity and titrate down oxygen.  Strictly counseled to quit smoking.  Initial viral screen negative.  Monitor inflammatory markers and cultures.  Continue diuresis with IV Lasix,  seems to be responding more to diuretics.  SpO2: 99 % O2 Flow Rate (L/min): 3 L/min     Acute on chronic diastolic CHF.  Echo noted EF preserved around 55%.  Diurese and monitor, dose diuretics daily.  Monitor renal function closely.  AKI On admission creatinine noted to be 1.2 with BUN 25.  Baseline creatinine previously noted to be 0.88-0.95.  Currently stable will continue to monitor ongoing diuresis.   Prolonged QT interval Acute.  QTc noted to be prolonged at 512.  Replace electrolytes, Celexa and mirtazapine held, QTc stable on repeat EKG 06/01/2022 at 470 ms, echo pending   Essential hypertension On low-dose beta-blocker now on oral Cardizem along with as needed IV hydralazine.  Monitor and adjust.   Hypomagnesemia.  Replaced.    Paroxysmal atrial fibrillation on chronic anticoagulation Went in RVR night of 05/31/2023, required Cardizem drip, on low-dose oral beta-blocker which will be continued, switch IV Cardizem to oral, TSH mildly suppressed likely sick euthyroid, free T4 stable, continue Eliquis, stable echo.   History of lung cancer with evidence of lung nodules on CT scan. Patient status post radiation treatments. -Continue outpatient follow-up with oncology and pulmonary outpatient to be arranged by PCP.   Left hip pain secondary to fall Prior to arrival patient reported missing a step and falling landing on her left hip.  On physical exam significant bruising around the left hip.  X-ray of the pelvis negative, pain has improved, PT and monitor.   Hyperlipidemia -Continue pharmacy substitution of pravastatin for home lovastatin   GERD -Pharmacy substitution of Protonix   Tobacco abuse Patient reports still smoking.  Consulted to quit. -Nicotine patch offered   Controlled diabetes mellitus type 2, without long-term use of insulin Currently on ISS  CBG (last 3)  Recent Labs    06/02/23 1606 06/02/23 2124 06/03/23 0748  GLUCAP 175* 225* 126*   Lab Results   Component Value Date   HGBA1C 6.0 (H) 05/31/2023         Condition - Extremely Guarded  Family Communication  :   None  Code Status :  Full  Consults  :  None  PUD Prophylaxis : PPI   Procedures  :     TTE -   1. Left ventricular ejection fraction, by estimation, is 55 to 60%. The left ventricle has normal function. The left ventricle has no regional wall motion abnormalities. Left ventricular diastolic parameters are consistent with Grade I diastolic dysfunction (impaired relaxation).  2. Right ventricular systolic function is low normal. The right ventricular size is normal.  3. Left atrial size was mild to moderately dilated.  4. The mitral valve is normal in structure. No evidence of mitral valve regurgitation. No evidence of mitral stenosis.  5. The aortic valve is normal in structure. Aortic valve regurgitation is not visualized. No aortic stenosis is present.  6. The inferior vena cava is normal in size with greater than 50% respiratory variability, suggesting right atrial pressure of 3 mmHg.  CTA - Stable left upper lobe spiculated nodule consistent with the given clinical history. Nodular density in the right lower lobe measuring up to 15 mm. This is new from the prior exam but given the adjacent mucous plugging and inflammatory change this may be inflammatory in nature. Short-term follow-up is recommended. Neoplasm is not excluded on the basis of this exam. Small patchy ground-glass opacities in the right upper lobe likely postinflammatory in nature. No evidence of pulmonary embolism. Aortic Atherosclerosis (ICD10-I70.0) and Emphysema (ICD10-J43.9)      Disposition Plan  :    Status is: Inpatient    DVT Prophylaxis  :     apixaban (ELIQUIS) tablet 5 mg     Lab Results  Component Value Date   PLT 249 06/03/2023    Diet :  Diet Order             Diet Heart Room service appropriate? Yes; Fluid consistency: Thin  Diet effective now  Inpatient Medications  Scheduled Meds:  amLODipine  10 mg Oral Daily   apixaban  5 mg Oral BID   arformoterol  15 mcg Nebulization BID   bisoprolol  10 mg Oral Daily   budesonide (PULMICORT) nebulizer solution  0.5 mg Nebulization BID   diltiazem  90 mg Oral Q8H   gabapentin  300 mg Oral Daily   gabapentin  300 mg Oral QHS   guaiFENesin  600 mg Oral BID   insulin aspart  0-5 Units Subcutaneous QHS   insulin aspart  0-9 Units Subcutaneous TID WC   ipratropium-albuterol  3 mL Nebulization QID   methylPREDNISolone (SOLU-MEDROL) injection  60 mg Intravenous Q24H   metolazone  2.5 mg Oral Once   nicotine  21 mg Transdermal Daily   oxybutynin  5 mg Oral BID   pantoprazole  40 mg Oral Daily   pravastatin  40 mg Oral q1800   sodium chloride flush  3 mL Intravenous Q12H   Continuous Infusions:  cefTRIAXone (ROCEPHIN)  IV Stopped (06/02/23 2200)   doxycycline (VIBRAMYCIN) IV Stopped (06/02/23 2135)   PRN Meds:.acetaminophen **OR** acetaminophen, hydrALAZINE, levalbuterol, trimethobenzamide    Objective:   Vitals:   06/03/23 0520 06/03/23 0752 06/03/23 0808 06/03/23 0824  BP: (!) 158/96 (!) 118/96    Pulse: 76 72 68   Resp: 18 (!) 21 20   Temp:   (!) 97.2 F (36.2 C)   TempSrc:   Oral   SpO2: 97% 99% 97% 99%  Weight:      Height:        Wt Readings from Last 3 Encounters:  05/30/23 64 kg  03/18/23 64 kg  02/11/23 63.5 kg     Intake/Output Summary (Last 24 hours) at 06/03/2023 0952 Last data filed at 06/03/2023 0850 Gross per 24 hour  Intake 563 ml  Output 700 ml  Net -137 ml     Physical Exam  Awake Alert, No new F.N deficits, Normal affect [place].AT,PERRAL Supple Neck, No JVD,   Symmetrical Chest wall movement, Good air movement bilaterally, coarse bilateral breath sounds with diffuse bibasilar crackles. RRR,No Gallops,Rubs or new Murmurs,  +ve B.Sounds, Abd Soft, No tenderness,   No Cyanosis, Clubbing or edema       Data Review:    Recent Labs  Lab  05/30/23 1944 06/01/23 0611 06/02/23 0452 06/03/23 0459  WBC 11.1* 7.7 11.4* 11.1*  HGB 13.5 11.0* 11.2* 11.4*  HCT 40.2 33.8* 34.4* 34.6*  PLT 263 204 226 249  MCV 98.5 101.5* 100.0 100.6*  MCH 33.1 33.0 32.6 33.1  MCHC 33.6 32.5 32.6 32.9  RDW 14.4 14.1 14.2 14.1  LYMPHSABS  --   --  1.8 1.8  MONOABS  --   --  0.6 0.8  EOSABS  --   --  0.0 0.0  BASOSABS  --   --  0.0 0.0    Recent Labs  Lab 05/30/23 1944 05/31/23 0014 05/31/23 0026 05/31/23 0349 05/31/23 1445 05/31/23 1828 06/01/23 0611 06/02/23 0452 06/03/23 0459  NA 137  --   --   --   --   --  139 137 136  K 3.9  --   --   --   --   --  3.7 3.3* 4.9  CL 93*  --   --   --   --   --  95* 100 101  CO2 26  --   --   --   --   --  28 28 24  ANIONGAP 18*  --   --   --   --   --  16* 9 11  GLUCOSE 207*  --   --   --   --   --  161* 128* 129*  BUN 25*  --   --   --   --   --  18 19 24*  CREATININE 1.20*  --   --   --   --   --  1.16* 0.99 0.99  CRP  --   --   --   --   --   --  7.0* 3.2* 2.1*  PROCALCITON  --   --   --   --  0.17  --  0.25 0.18 <0.10  LATICACIDVEN  --   --  3.7* 1.8  --   --   --   --   --   TSH  --   --   --   --   --   --  0.294*  --   --   HGBA1C  --   --   --   --   --  6.0*  --   --   --   BNP  --  283.6*  --   --   --   --  820.0* 466.0* 438.9*  MG  --   --   --   --   --   --  1.6* 2.0 1.7  CALCIUM 9.2  --   --   --   --   --  8.8* 8.6* 8.4*      Recent Labs  Lab 05/30/23 1944 05/31/23 0014 05/31/23 0026 05/31/23 0349 05/31/23 1445 05/31/23 1828 06/01/23 0611 06/02/23 0452 06/03/23 0459  CRP  --   --   --   --   --   --  7.0* 3.2* 2.1*  PROCALCITON  --   --   --   --  0.17  --  0.25 0.18 <0.10  LATICACIDVEN  --   --  3.7* 1.8  --   --   --   --   --   TSH  --   --   --   --   --   --  0.294*  --   --   HGBA1C  --   --   --   --   --  6.0*  --   --   --   BNP  --  283.6*  --   --   --   --  820.0* 466.0* 438.9*  MG  --   --   --   --   --   --  1.6* 2.0 1.7  CALCIUM 9.2  --   --    --   --   --  8.8* 8.6* 8.4*    --------------------------------------------------------------------------------------------------------------- No results found for: "CHOL", "HDL", "LDLCALC", "LDLDIRECT", "TRIG", "CHOLHDL"  Lab Results  Component Value Date   HGBA1C 6.0 (H) 05/31/2023   Recent Labs    06/01/23 0611  TSH 0.294*  FREET4 0.66   No results for input(s): "VITAMINB12", "FOLATE", "FERRITIN", "TIBC", "IRON", "RETICCTPCT" in the last 72 hours. ------------------------------------------------------------------------------------------------------------------ Cardiac Enzymes No results for input(s): "CKMB", "TROPONINI", "MYOGLOBIN" in the last 168 hours.  Invalid input(s): "CK"  Micro Results Recent Results (from the past 240 hours)  Culture, blood (Routine X 2) w Reflex to ID Panel     Status: None (Preliminary result)   Collection Time: 05/31/23 12:14  AM   Specimen: BLOOD  Result Value Ref Range Status   Specimen Description BLOOD SITE NOT SPECIFIED  Final   Special Requests   Final    BOTTLES DRAWN AEROBIC AND ANAEROBIC Blood Culture adequate volume   Culture   Final    NO GROWTH 3 DAYS Performed at [facility], [address] [place], [place] [address]    Report Status PENDING  Incomplete  Culture, blood (Routine X 2) w Reflex to ID Panel     Status: None (Preliminary result)   Collection Time: 05/31/23 12:14 AM   Specimen: BLOOD  Result Value Ref Range Status   Specimen Description BLOOD SITE NOT SPECIFIED  Final   Special Requests   Final    BOTTLES DRAWN AEROBIC AND ANAEROBIC Blood Culture results may not be optimal due to an inadequate volume of blood received in culture bottles   Culture   Final    NO GROWTH 3 DAYS Performed at [facility], [address] [place], [place] [address]    Report Status PENDING  Incomplete  Resp panel by RT-PCR (RSV, Flu A&B, Covid)     Status: None   Collection Time: 05/31/23 12:14 AM   Specimen: Nasal Swab   Result Value Ref Range Status   SARS Coronavirus 2 by RT PCR NEGATIVE NEGATIVE Final   Influenza A by PCR NEGATIVE NEGATIVE Final   Influenza B by PCR NEGATIVE NEGATIVE Final    Comment: (NOTE) The Xpert Xpress SARS-CoV-2/FLU/RSV plus assay is intended as an aid in the diagnosis of influenza from Nasopharyngeal swab specimens and should not be used as a sole basis for treatment. Nasal washings and aspirates are unacceptable for Xpert Xpress SARS-CoV-2/FLU/RSV testing.  Fact Sheet for Patients: BloggerCourse.com  Fact Sheet for Healthcare Providers: SeriousBroker.it  This test is not yet approved or cleared by the [place] FDA and has been authorized for detection and/or diagnosis of SARS-CoV-2 by FDA under an Emergency Use Authorization (EUA). This EUA will remain in effect (meaning this test can be used) for the duration of the COVID-19 declaration under Section 564(b)(1) of the Act, 21 U.S.C. section 360bbb-3(b)(1), unless the authorization is terminated or revoked.     Resp Syncytial Virus by PCR NEGATIVE NEGATIVE Final    Comment: (NOTE) Fact Sheet for Patients: BloggerCourse.com  Fact Sheet for Healthcare Providers: SeriousBroker.it  This test is not yet approved or cleared by the [place] FDA and has been authorized for detection and/or diagnosis of SARS-CoV-2 by FDA under an Emergency Use Authorization (EUA). This EUA will remain in effect (meaning this test can be used) for the duration of the COVID-19 declaration under Section 564(b)(1) of the Act, 21 U.S.C. section 360bbb-3(b)(1), unless the authorization is terminated or revoked.  Performed at [facility], [address], [place], [place] [address]   Urine Culture     Status: Abnormal   Collection Time: 05/31/23 12:51 AM   Specimen: Urine, Random  Result Value Ref Range Status   Specimen  Description URINE, RANDOM  Final   Special Requests   Final    NONE Reflexed from [id number] Performed at [facility], [address] [place], [place] [address]    Culture MULTIPLE SPECIES PRESENT, SUGGEST RECOLLECTION (A)  Final   Report Status 06/01/2023 FINAL  Final    Radiology Report DG Chest Port 1 View Result Date: 06/03/2023 CLINICAL DATA:  [id number] with shortness of breath. EXAM: PORTABLE CHEST 1 VIEW COMPARISON:  Portable chest March 2 at 6:42 a.m. FINDINGS: 6:38 a.m.: 1 cm left upper lobe nodule again noted with adjacent fiducial marker and linear opacity. Patchy airspace disease in the right lung base is similar to the prior study with remaining lungs clear. Stable minimal right pleural effusion. The mediastinum is stable. There is aortic uncoiling and atherosclerosis, CABG changes. The heart is slightly enlarged. No vascular congestion is seen. Osteopenia with no new osseous findings. IMPRESSION: 1. Stable chest with patchy airspace disease in the right lung base and minimal right pleural effusion. 2. 1 cm left upper lobe nodule with adjacent fiducial marker and linear opacity. 3. Aortic atherosclerosis and CABG changes. Electronically Signed   By: Almira Bar M.D.   On: 06/03/2023 07:08   ECHOCARDIOGRAM COMPLETE Result Date: 06/02/2023    ECHOCARDIOGRAM REPORT   Patient Name:   Holly May Date of Exam: 06/02/2023 Medical Rec #:  [id number]     Height:       67.0 in Accession #:    [id number]    Weight:       141.1 lb Date of Birth:  05-Nov-1950    BSA:          1.744 m Patient Age:    72 years      BP:           162/97 mmHg Patient Gender: F             HR:           60 bpm. Exam Location:  Inpatient Procedure: 2D Echo, Cardiac Doppler and Color Doppler (Both Spectral and Color            Flow Doppler were utilized during procedure). Indications:    Atrial Fibrillation  History:        Patient has prior history of Echocardiogram examinations, most                 recent 07/17/2022.  COPD; Risk Factors:Hypertension, Diabetes and                 Current Smoker.  Sonographer:    Karma Ganja Referring Phys: [facility]  Sonographer Comments: Technically difficult study due to poor echo windows. Image acquisition challenging due to patient body habitus, Image acquisition challenging due to COPD and Image acquisition challenging due to respiratory motion. IMPRESSIONS  1. Left ventricular ejection fraction, by estimation, is 55 to 60%. The left ventricle has normal function. The left ventricle has no regional wall motion abnormalities. Left ventricular diastolic parameters are consistent with Grade I diastolic dysfunction (impaired relaxation).  2. Right ventricular systolic function is low normal. The right ventricular size is normal.  3. Left atrial size was mild to moderately dilated.  4. The mitral valve is normal in structure. No evidence of mitral valve regurgitation. No evidence of mitral stenosis.  5. The aortic valve is normal in structure. Aortic valve regurgitation is not visualized. No aortic stenosis is present.  6. The inferior vena cava is normal in size with greater than 50% respiratory variability, suggesting right atrial pressure of 3 mmHg. FINDINGS  Left Ventricle: Left ventricular ejection fraction, by estimation, is 55 to 60%. The left ventricle has normal function. The left ventricle has no regional wall motion abnormalities. Definity contrast agent was given IV to delineate the left ventricular  endocardial borders. The left ventricular internal cavity size was normal in size. There is no left ventricular hypertrophy. Left ventricular diastolic parameters are consistent with  Grade I diastolic dysfunction (impaired relaxation). Right Ventricle: The right ventricular size is normal. No increase in right ventricular wall thickness. Right ventricular systolic function is low normal. Left Atrium: Left atrial size was mild to moderately dilated. Right Atrium: Right atrial size  was normal in size. Pericardium: There is no evidence of pericardial effusion. Mitral Valve: The mitral valve is normal in structure. No evidence of mitral valve regurgitation. No evidence of mitral valve stenosis. MV peak gradient, 4.2 mmHg. The mean mitral valve gradient is 2.0 mmHg. Tricuspid Valve: The tricuspid valve is normal in structure. Tricuspid valve regurgitation is not demonstrated. No evidence of tricuspid stenosis. Aortic Valve: The aortic valve is normal in structure. Aortic valve regurgitation is not visualized. No aortic stenosis is present. Aortic valve mean gradient measures 3.0 mmHg. Aortic valve peak gradient measures 6.6 mmHg. Aortic valve area, by VTI measures 2.05 cm. Pulmonic Valve: The pulmonic valve was normal in structure. Pulmonic valve regurgitation is not visualized. No evidence of pulmonic stenosis. Aorta: The aortic root is normal in size and structure. Venous: The inferior vena cava is normal in size with greater than 50% respiratory variability, suggesting right atrial pressure of 3 mmHg. IAS/Shunts: No atrial level shunt detected by color flow Doppler.  LEFT VENTRICLE PLAX 2D LVIDd:         4.40 cm     Diastology LVIDs:         3.50 cm     LV e' medial:    7.40 cm/s LV PW:         1.00 cm     LV E/e' medial:  12.2 LV IVS:        1.30 cm     LV e' lateral:   10.90 cm/s LVOT diam:     2.00 cm     LV E/e' lateral: 8.3 LV SV:         56 LV SV Index:   32 LVOT Area:     3.14 cm  LV Volumes (MOD) LV vol d, MOD A2C: 62.1 ml LV vol d, MOD A4C: 75.9 ml LV vol s, MOD A2C: 25.8 ml LV vol s, MOD A4C: 27.9 ml LV SV MOD A2C:     36.3 ml LV SV MOD A4C:     75.9 ml LV SV MOD BP:      43.3 ml RIGHT VENTRICLE RV Basal diam:  3.30 cm RV S prime:     10.20 cm/s TAPSE (M-mode): 1.9 cm LEFT ATRIUM             Index        RIGHT ATRIUM           Index LA diam:        3.90 cm 2.24 cm/m   RA Area:     13.90 cm LA Vol (A2C):   72.3 ml 41.47 ml/m  RA Volume:   33.70 ml  19.33 ml/m LA Vol (A4C):    87.2 ml 50.01 ml/m LA Biplane Vol: 80.0 ml 45.88 ml/m  AORTIC VALVE AV Area (Vmax):    2.21 cm AV Area (Vmean):   2.05 cm AV Area (VTI):     2.05 cm AV Vmax:           128.00 cm/s AV Vmean:          84.100 cm/s AV VTI:            0.274 m AV Peak Grad:      6.6 mmHg AV  Mean Grad:      3.0 mmHg LVOT Vmax:         90.10 cm/s LVOT Vmean:        55.000 cm/s LVOT VTI:          0.179 m LVOT/AV VTI ratio: 0.65  AORTA Ao Root diam: 3.20 cm MITRAL VALVE MV Area (PHT): 3.27 cm    SHUNTS MV Area VTI:   1.84 cm    Systemic VTI:  0.18 m MV Peak grad:  4.2 mmHg    Systemic Diam: 2.00 cm MV Mean grad:  2.0 mmHg MV Vmax:       1.02 m/s MV Vmean:      63.1 cm/s MV Decel Time: 232 msec MV E velocity: 90.10 cm/s MV A velocity: 75.50 cm/s MV E/A ratio:  1.19 Clearnce Hasten Electronically signed by Clearnce Hasten Signature Date/Time: 06/02/2023/10:17:29 AM    Final      Signature  -   Susa Raring M.D on 06/03/2023 at 9:52 AM   -  To page go to [email or web address]

## 2023-06-03 NOTE — TOC Progression Note (Signed)
 Transition of Care [facility]) - Progression Note    Patient Details  Name: Holly May MRN: [id number] Date of Birth: Oct 31, 1950  Transition of Care [facility]) CM/SW Contact  Tom-Johnson, Hershal Coria, RN Phone Number: 06/03/2023, 6:24 PM  Clinical Narrative:     CM spoke with patient at bedside abut home health recommendations. Patient has no preference. CM sent in referral to [facility] and Amy noted acceptance, info on AVS.  Rollator ordered from [employer] and Earna Coder to deliver to patient at bedside.   CM will continue to follow as patient progresses with care towards discharge.            Expected Discharge Plan and Services                                               Social Determinants of Health (SDOH) Interventions SDOH Screenings   Food Insecurity: Food Insecurity Present (05/31/2023)  Housing: Low Risk  (06/02/2023)  Transportation Needs: No Transportation Needs (05/31/2023)  Utilities: Not At Risk (05/31/2023)  Alcohol Screen: Low Risk  (07/30/2022)  Depression (PHQ2-9): Low Risk  (08/21/2022)  Social Connections: Moderately Isolated (05/31/2023)  Tobacco Use: High Risk (03/18/2023)    Readmission Risk Interventions     No data to display

## 2023-06-03 NOTE — Care Management Important Message (Signed)
 Important Message  Patient Details  Name: Holly May MRN: [id number] Date of Birth: 10/07/1950   Important Message Given:  Yes - Medicare IM     Dorena Bodo 06/03/2023, 3:13 PM

## 2023-06-03 NOTE — Evaluation (Signed)
 Occupational Therapy Evaluation Patient Details Name: Holly May MRN: [id number] DOB: 1950-08-23 Today's Date: 06/03/2023   History of Present Illness   73 y.o. female presents 05/31/23 with worsening cough and chills.  Sepsis 2/2 UTI; PMH significant of hypertension, paroxysmal atrial fibrillation, PAF, severe COPD, diabetes mellitus type 2, lung adenocarcinoma on radiation therapy, tobacco abuse     Clinical Impressions Pt reports ind at baseline with ADLs/functional mobility, lives with family who can provide limited assist. Pt currently needing set up - CGA for ADLs, and supervision for transfer without AD. Pt able to stand for grooming task at sink and perform simulated tub transfer during session. Pt denies fatigue, but reports at home she gets easily tired with IADLs tasks. Pt presenting with impairments listed below, will follow acutely. Recommend HHOT at d/c.     If plan is discharge home, recommend the following:   A little help with walking and/or transfers;A little help with bathing/dressing/bathroom;Assistance with cooking/housework;Direct supervision/assist for medications management;Direct supervision/assist for financial management;Assist for transportation     Functional Status Assessment   Patient has had a recent decline in their functional status and demonstrates the ability to make significant improvements in function in a reasonable and predictable amount of time.     Equipment Recommendations   Tub/shower seat     Recommendations for Other Services   PT consult     Precautions/Restrictions   Precautions Precautions: Fall;Other (comment) Recall of Precautions/Restrictions: Intact Precaution/Restrictions Comments: monitor O2 sats Restrictions Weight Bearing Restrictions Per Provider Order: No     Mobility Bed Mobility               General bed mobility comments: up in recliner    Transfers Overall transfer level: Needs  assistance Equipment used: None Transfers: Sit to/from Stand, Bed to chair/wheelchair/BSC Sit to Stand: Supervision                  Balance Overall balance assessment: Needs assistance Sitting-balance support: No upper extremity supported, Feet supported Sitting balance-Leahy Scale: Good     Standing balance support: No upper extremity supported Standing balance-Leahy Scale: Fair                             ADL either performed or assessed with clinical judgement   ADL Overall ADL's : Needs assistance/impaired Eating/Feeding: Set up;Sitting   Grooming: Set up;Sitting   Upper Body Bathing: Contact guard assist;Sitting   Lower Body Bathing: Contact guard assist;Sitting/lateral leans   Upper Body Dressing : Contact guard assist;Sitting   Lower Body Dressing: Contact guard assist;Sitting/lateral leans   Toilet Transfer: Supervision/safety;Ambulation   Toileting- Clothing Manipulation and Hygiene: Supervision/safety   Tub/ Engineer, structural: Tub transfer;Supervision/safety   Functional mobility during ADLs: Supervision/safety       Vision   Vision Assessment?: No apparent visual deficits     Perception Perception: Not tested       Praxis Praxis: Not tested       Pertinent Vitals/Pain Pain Assessment Pain Assessment: No/denies pain     Extremity/Trunk Assessment Upper Extremity Assessment Upper Extremity Assessment: Overall WFL for tasks assessed   Lower Extremity Assessment Lower Extremity Assessment: Defer to PT evaluation   Cervical / Trunk Assessment Cervical / Trunk Assessment: Normal   Communication Communication Communication: Impaired Factors Affecting Communication: Reduced clarity of speech   Cognition Arousal: Alert Behavior During Therapy: WFL for tasks assessed/performed Cognition: No apparent impairments  Following commands: Intact       Cueing  General Comments       VSS on 3L O2   Exercises     Shoulder Instructions      Home Living Family/patient expects to be discharged to:: Private residence Living Arrangements: Children Available Help at Discharge: Family;Available 24 hours/day Type of Home: House Home Access: Stairs to enter [employer] of Steps: 3 Entrance Stairs-Rails: None Home Layout: One level     Bathroom Shower/Tub: Chief Strategy Officer: Standard     Home Equipment: Cane - single point          Prior Functioning/Environment Prior Level of Function : Independent/Modified Independent;Driving             Mobility Comments: independent; reports decr balance but doesn't use cane bc she doesn't trust it; gets dyspneic; does grocery shopping ADLs Comments: independent    OT Problem List: Decreased strength;Decreased range of motion;Decreased activity tolerance;Impaired balance (sitting and/or standing);Decreased safety awareness;Decreased cognition;Cardiopulmonary status limiting activity   OT Treatment/Interventions: Self-care/ADL training;Therapeutic exercise;Energy conservation;DME and/or AE instruction;Therapeutic activities;Patient/family education;Balance training      OT Goals(Current goals can be found in the care plan section)   Acute Rehab OT Goals Patient Stated Goal: none stated OT Goal Formulation: With patient Time For Goal Achievement: 06/17/23 Potential to Achieve Goals: Good ADL Goals Pt Will Perform Upper Body Dressing: with modified independence;sitting Pt Will Perform Lower Body Dressing: with modified independence;sitting/lateral leans;sit to/from stand Pt Will Transfer to Toilet: with modified independence;ambulating;regular height toilet Additional ADL Goal #1: pt will verbalize x3 energy conservation strategies in prep for ADLs Additional ADL Goal #2: pt will tolerate OOB activity x83min wtih SpO2 above 90% in prep for ADLs   OT Frequency:  Min 1X/week     Co-evaluation              AM-PAC OT "6 Clicks" Daily Activity     Outcome Measure Help from another person eating meals?: None Help from another person taking care of personal grooming?: A Little Help from another person toileting, which includes using toliet, bedpan, or urinal?: A Little Help from another person bathing (including washing, rinsing, drying)?: A Little Help from another person to put on and taking off regular upper body clothing?: A Little Help from another person to put on and taking off regular lower body clothing?: A Little 6 Click Score: 19   End of Session Equipment Utilized During Treatment: Oxygen Nurse Communication: Mobility status  Activity Tolerance: Patient tolerated treatment well Patient left: in chair;with call bell/phone within reach  OT Visit Diagnosis: Muscle weakness (generalized) (M62.81)                Time: 1610-9604 OT Time Calculation (min): 21 min Charges:  OT General Charges $OT Visit: 1 Visit OT Evaluation $OT Eval Low Complexity: 1 Low  Carver Fila, OTD, OTR/L SecureChat Preferred Acute Rehab [phone]   Sinan Tuch K Koonce 06/03/2023, 12:33 PM

## 2023-06-03 NOTE — Plan of Care (Signed)
   Problem: Clinical Measurements: Goal: Ability to maintain clinical measurements within normal limits will improve Outcome: Progressing Goal: Will remain free from infection Outcome: Progressing Goal: Respiratory complications will improve Outcome: Progressing Goal: Cardiovascular complication will be avoided Outcome: Progressing   Problem: Activity: Goal: Risk for activity intolerance will decrease Outcome: Progressing

## 2023-06-03 NOTE — Plan of Care (Signed)

## 2023-06-04 DIAGNOSIS — J441 Chronic obstructive pulmonary disease with (acute) exacerbation: Secondary | ICD-10-CM | POA: Diagnosis not present

## 2023-06-04 DIAGNOSIS — A419 Sepsis, unspecified organism: Secondary | ICD-10-CM | POA: Diagnosis not present

## 2023-06-04 LAB — BASIC METABOLIC PANEL
Anion gap: 11 (ref 5–15)
BUN: 27 mg/dL — ABNORMAL HIGH (ref 8–23)
CO2: 26 mmol/L (ref 22–32)
Calcium: 9.3 mg/dL (ref 8.9–10.3)
Chloride: 95 mmol/L — ABNORMAL LOW (ref 98–111)
Creatinine, Ser: 0.95 mg/dL (ref 0.44–1.00)
GFR, Estimated: >60 mL/min (ref >60)
Glucose, Bld: 182 mg/dL — ABNORMAL HIGH (ref 70–99)
Potassium: 4.2 mmol/L (ref 3.5–5.1)
Sodium: 132 mmol/L — ABNORMAL LOW (ref 135–145)

## 2023-06-04 LAB — CBC WITH DIFFERENTIAL/PLATELET
Abs Immature Granulocytes: 0.08 10*3/uL — ABNORMAL HIGH (ref 0.00–0.07)
Basophils Absolute: 0 10*3/uL (ref 0.0–0.1)
Basophils Relative: 0 %
Eosinophils Absolute: 0 10*3/uL (ref 0.0–0.5)
Eosinophils Relative: 0 %
HCT: 36.1 % (ref 36.0–46.0)
Hemoglobin: 12.1 g/dL (ref 12.0–15.0)
Immature Granulocytes: 1 %
Lymphocytes Relative: 14 %
Lymphs Abs: 1.6 10*3/uL (ref 0.7–4.0)
MCH: 33 pg (ref 26.0–34.0)
MCHC: 33.5 g/dL (ref 30.0–36.0)
MCV: 98.4 fL (ref 80.0–100.0)
Monocytes Absolute: 0.8 10*3/uL (ref 0.1–1.0)
Monocytes Relative: 7 %
Neutro Abs: 8.5 10*3/uL — ABNORMAL HIGH (ref 1.7–7.7)
Neutrophils Relative %: 78 %
Platelets: 270 10*3/uL (ref 150–400)
RBC: 3.67 MIL/uL — ABNORMAL LOW (ref 3.87–5.11)
RDW: 14.2 % (ref 11.5–15.5)
WBC: 11 10*3/uL — ABNORMAL HIGH (ref 4.0–10.5)
nRBC: 0 % (ref 0.0–0.2)

## 2023-06-04 LAB — GLUCOSE, CAPILLARY
Glucose-Capillary: 198 mg/dL — ABNORMAL HIGH (ref 70–99)
Glucose-Capillary: 190 mg/dL — ABNORMAL HIGH (ref 70–99)
Glucose-Capillary: 210 mg/dL — ABNORMAL HIGH (ref 70–99)
Glucose-Capillary: 335 mg/dL — ABNORMAL HIGH (ref 70–99)

## 2023-06-04 LAB — MAGNESIUM: Magnesium: 1.9 mg/dL (ref 1.7–2.4)

## 2023-06-04 LAB — BRAIN NATRIURETIC PEPTIDE: B Natriuretic Peptide: 454.4 pg/mL — ABNORMAL HIGH (ref 0.0–100.0)

## 2023-06-04 LAB — C-REACTIVE PROTEIN: CRP: 1.4 mg/dL — ABNORMAL HIGH (ref <1.0)

## 2023-06-04 LAB — PROCALCITONIN: Procalcitonin: <0.1 ng/mL

## 2023-06-04 MED ORDER — FUROSEMIDE 10 MG/ML IJ SOLN
40.0000 mg | Freq: Once | INTRAMUSCULAR | Status: AC
  Administered 2023-06-04: 40 mg via INTRAVENOUS
  Filled 2023-06-04: qty 4

## 2023-06-04 NOTE — Plan of Care (Signed)
  Problem: Education: Goal: Knowledge of General Education information will improve Description: Including pain rating scale, medication(s)/side effects and non-pharmacologic comfort measures Outcome: Progressing   Problem: Clinical Measurements: Goal: Ability to maintain clinical measurements within normal limits will improve Outcome: Progressing Goal: Respiratory complications will improve Outcome: Progressing Goal: Cardiovascular complication will be avoided Outcome: Progressing   Problem: Coping: Goal: Level of anxiety will decrease Outcome: Progressing   Problem: Safety: Goal: Ability to remain free from injury will improve Outcome: Progressing   

## 2023-06-04 NOTE — Progress Notes (Signed)
 PROGRESS NOTE                                                                                                                                                                                                             Patient Demographics:    Holly May, is a 73 y.o. female, DOB - [birth year], XNA:[id number]  Outpatient Primary MD for the patient is Pcp, No    LOS - 4  Admit date - 05/30/2023    Chief Complaint  Patient presents with   Chest Pain       Brief Narrative (HPI from H&P)   73 y.o. female with medical history significant of hypertension, paroxysmal atrial fibrillation, PAF, severe COPD, diabetes mellitus type 2, lung adenocarcinoma on radiation therapy, tobacco abuse presents with worsening cough and chills.  [name] with sepsis due to combination of UTI and community-acquired pneumonia, COPD exacerbation, acute on chronic diastolic CHF along with atrial fibrillation with RVR.   Subjective:   Patient ports good night sleep, report dyspnea has improved, but report still present, worsening with minimal activity, as well she reports generalized weakness and fatigue.     Assessment  & Plan :    Sepsis secondary to urinary tract infection along with community-acquired pneumonia with COPD exacerbation, acute hypoxic respiratory failure due to community-acquired pneumonia and COPD exacerbation and a large component of acute on chronic diastolic CHF.  Sepsis present on admission has resolved, she has been placed on appropriate IV antibiotics which will be continued, low-dose steroids for COPD exacerbation, follow cultures, monitor bladder scans, continue nebulizer treatments, encouraged to sit in chair use I-S and flutter valve for pulmonary toiletry, advance activity and titrate down oxygen.  Strictly counseled to quit smoking.  Initial viral screen negative.   -.Continue with IV steroids . -Continue with IV antibiotics  .-She was encouraged to use incentive spirometer and flutter valve specially with some evidence of mucous plugging, started on Mucinex. -Continue with IV Lasix   SpO2: 93 % O2 Flow Rate (L/min): 2 L/min     Acute on chronic diastolic CHF.   -Echo noted EF preserved around 55%.  Diurese and monitor, dose diuretics daily.  Monitor renal function closely.  Blood pressure and renal function seems stable, will give 40 mg of IV Lasix today.  AKI On admission creatinine noted to be 1.2 with  BUN 25.  Baseline creatinine previously noted to be 0.88-0.95.  Currently stable will continue to monitor ongoing diuresis.   Prolonged QT interval Acute.  QTc noted to be prolonged at 512.  Replace electrolytes, Celexa and mirtazapine held, QTc stable on repeat EKG 06/01/2022 at 470 ms, echo pending   Essential hypertension On low-dose beta-blocker now on oral Cardizem along with as needed IV hydralazine.  Monitor and adjust.   Hypomagnesemia.  Replaced.    Paroxysmal atrial fibrillation on chronic anticoagulation Went in RVR night of 05/31/2023, required Cardizem drip, on low-dose oral beta-blocker which will be continued, switch IV Cardizem to oral, TSH mildly suppressed likely sick euthyroid, free T4 stable, continue Eliquis, stable echo.   History of lung cancer with evidence of lung nodules on CT scan. Patient status post radiation treatments. -Continue outpatient follow-up with oncology and pulmonary outpatient to be arranged by PCP.   Left hip pain secondary to fall Prior to arrival patient reported missing a step and falling landing on her left hip.  On physical exam significant bruising around the left hip.  X-ray of the pelvis negative, pain has improved, PT and monitor.   Hyperlipidemia -Continue pharmacy substitution of pravastatin for home lovastatin   GERD -Pharmacy substitution of Protonix   Tobacco abuse Patient reports still smoking.  Consulted to quit. -Nicotine patch offered    Controlled diabetes mellitus type 2, without long-term use of insulin Currently on ISS  CBG (last 3)  Recent Labs    06/03/23 2126 06/04/23 0757 06/04/23 1127  GLUCAP 283* 198* 190*   Lab Results  Component Value Date   HGBA1C 6.0 (H) 05/31/2023         Condition - Extremely Guarded  Family Communication  :   None  Code Status :  Full  Consults  :  None  PUD Prophylaxis : PPI   Procedures  :     TTE -   1. Left ventricular ejection fraction, by estimation, is 55 to 60%. The left ventricle has normal function. The left ventricle has no regional wall motion abnormalities. Left ventricular diastolic parameters are consistent with Grade I diastolic dysfunction (impaired relaxation).  2. Right ventricular systolic function is low normal. The right ventricular size is normal.  3. Left atrial size was mild to moderately dilated.  4. The mitral valve is normal in structure. No evidence of mitral valve regurgitation. No evidence of mitral stenosis.  5. The aortic valve is normal in structure. Aortic valve regurgitation is not visualized. No aortic stenosis is present.  6. The inferior vena cava is normal in size with greater than 50% respiratory variability, suggesting right atrial pressure of 3 mmHg.  CTA - Stable left upper lobe spiculated nodule consistent with the given clinical history. Nodular density in the right lower lobe measuring up to 15 mm. This is new from the prior exam but given the adjacent mucous plugging and inflammatory change this may be inflammatory in nature. Short-term follow-up is recommended. Neoplasm is not excluded on the basis of this exam. Small patchy ground-glass opacities in the right upper lobe likely postinflammatory in nature. No evidence of pulmonary embolism. Aortic Atherosclerosis (ICD10-I70.0) and Emphysema (ICD10-J43.9)      Disposition Plan  :    Status is: Inpatient    DVT Prophylaxis  :     apixaban (ELIQUIS) tablet 5 mg     Lab  Results  Component Value Date   PLT 270 06/04/2023    Diet :  Diet  Order             Diet Heart Room service appropriate? Yes; Fluid consistency: Thin  Diet effective now                    Inpatient Medications  Scheduled Meds:  amLODipine  10 mg Oral Daily   apixaban  5 mg Oral BID   arformoterol  15 mcg Nebulization BID   bisoprolol  10 mg Oral Daily   budesonide (PULMICORT) nebulizer solution  0.5 mg Nebulization BID   diltiazem  90 mg Oral Q8H   docusate sodium  200 mg Oral BID   gabapentin  300 mg Oral Daily   gabapentin  300 mg Oral QHS   guaiFENesin  600 mg Oral BID   insulin aspart  0-5 Units Subcutaneous QHS   insulin aspart  0-9 Units Subcutaneous TID WC   ipratropium-albuterol  3 mL Nebulization QID   methylPREDNISolone (SOLU-MEDROL) injection  60 mg Intravenous Q24H   nicotine  21 mg Transdermal Daily   oxybutynin  5 mg Oral BID   pantoprazole  40 mg Oral Daily   pravastatin  40 mg Oral q1800   sodium chloride flush  3 mL Intravenous Q12H   Continuous Infusions:  cefTRIAXone (ROCEPHIN)  IV 2 g (06/03/23 2315)   doxycycline (VIBRAMYCIN) IV 100 mg (06/04/23 0851)   PRN Meds:.acetaminophen **OR** acetaminophen, hydrALAZINE, levalbuterol, trimethobenzamide    Objective:   Vitals:   06/04/23 0419 06/04/23 0748 06/04/23 0800 06/04/23 1207  BP: (!) 131/99  126/69 (!) 141/95  Pulse: 95  73 (!) 111  Resp: 15   17  Temp: 97.9 F (36.6 C)  97.7 F (36.5 C) 98.5 F (36.9 C)  TempSrc: Oral  Oral Oral  SpO2: 91% 99% 100% 93%  Weight:      Height:        Wt Readings from Last 3 Encounters:  05/30/23 64 kg  03/18/23 64 kg  02/11/23 63.5 kg     Intake/Output Summary (Last 24 hours) at 06/04/2023 1253 Last data filed at 06/04/2023 1230 Gross per 24 hour  Intake 1460 ml  Output 1400 ml  Net 60 ml     Physical Exam   Awake Alert, Oriented X 3, trembly frail, deconditioned, appears older than stated age Symmetrical Chest wall movement, coarse  respiratory sounds bilaterally RRR,No Gallops,Rubs or new Murmurs, No Parasternal Heave +ve B.Sounds, Abd Soft, No tenderness, No rebound - guarding or rigidity. No Cyanosis, Clubbing or edema, No new Rash or bruise       Data Review:    Recent Labs  Lab 05/30/23 1944 06/01/23 0611 06/02/23 0452 06/03/23 0459 06/04/23 0526  WBC 11.1* 7.7 11.4* 11.1* 11.0*  HGB 13.5 11.0* 11.2* 11.4* 12.1  HCT 40.2 33.8* 34.4* 34.6* 36.1  PLT 263 204 226 249 270  MCV 98.5 101.5* 100.0 100.6* 98.4  MCH 33.1 33.0 32.6 33.1 33.0  MCHC 33.6 32.5 32.6 32.9 33.5  RDW 14.4 14.1 14.2 14.1 14.2  LYMPHSABS  --   --  1.8 1.8 1.6  MONOABS  --   --  0.6 0.8 0.8  EOSABS  --   --  0.0 0.0 0.0  BASOSABS  --   --  0.0 0.0 0.0    Recent Labs  Lab 05/30/23 1944 05/31/23 0014 05/31/23 0026 05/31/23 0349 05/31/23 1445 05/31/23 1828 06/01/23 4098 06/02/23 0452 06/03/23 0459 06/04/23 0526  NA 137  --   --   --   --   --  139 137 136 132*  K 3.9  --   --   --   --   --  3.7 3.3* 4.9 4.2  CL 93*  --   --   --   --   --  95* 100 101 95*  CO2 26  --   --   --   --   --  28 28 24 26   ANIONGAP 18*  --   --   --   --   --  16* 9 11 11   GLUCOSE 207*  --   --   --   --   --  161* 128* 129* 182*  BUN 25*  --   --   --   --   --  18 19 24* 27*  CREATININE 1.20*  --   --   --   --   --  1.16* 0.99 0.99 0.95  CRP  --   --   --   --   --   --  7.0* 3.2* 2.1* 1.4*  PROCALCITON  --   --   --   --  0.17  --  0.25 0.18 <0.10 <0.10  LATICACIDVEN  --   --  3.7* 1.8  --   --   --   --   --   --   TSH  --   --   --   --   --   --  0.294*  --   --   --   HGBA1C  --   --   --   --   --  6.0*  --   --   --   --   BNP  --  283.6*  --   --   --   --  820.0* 466.0* 438.9* 454.4*  MG  --   --   --   --   --   --  1.6* 2.0 1.7 1.9  CALCIUM 9.2  --   --   --   --   --  8.8* 8.6* 8.4* 9.3      Recent Labs  Lab 05/30/23 1944 05/31/23 0014 05/31/23 0026 05/31/23 0349 05/31/23 1445 05/31/23 1828 06/01/23 1191  06/02/23 0452 06/03/23 0459 06/04/23 0526  CRP  --   --   --   --   --   --  7.0* 3.2* 2.1* 1.4*  PROCALCITON  --   --   --   --  0.17  --  0.25 0.18 <0.10 <0.10  LATICACIDVEN  --   --  3.7* 1.8  --   --   --   --   --   --   TSH  --   --   --   --   --   --  0.294*  --   --   --   HGBA1C  --   --   --   --   --  6.0*  --   --   --   --   BNP  --  283.6*  --   --   --   --  820.0* 466.0* 438.9* 454.4*  MG  --   --   --   --   --   --  1.6* 2.0 1.7 1.9  CALCIUM 9.2  --   --   --   --   --  8.8* 8.6* 8.4* 9.3    --------------------------------------------------------------------------------------------------------------- No results found for: "  CHOL", "HDL", "LDLCALC", "LDLDIRECT", "TRIG", "CHOLHDL"  Lab Results  Component Value Date   HGBA1C 6.0 (H) 05/31/2023   No results for input(s): "TSH", "T4TOTAL", "FREET4", "T3FREE", "THYROIDAB" in the last 72 hours.  No results for input(s): "VITAMINB12", "FOLATE", "FERRITIN", "TIBC", "IRON", "RETICCTPCT" in the last 72 hours. ------------------------------------------------------------------------------------------------------------------ Cardiac Enzymes No results for input(s): "CKMB", "TROPONINI", "MYOGLOBIN" in the last 168 hours.  Invalid input(s): "CK"  Micro Results Recent Results (from the past 240 hours)  Culture, blood (Routine X 2) w Reflex to ID Panel     Status: None (Preliminary result)   Collection Time: 05/31/23 12:14 AM   Specimen: BLOOD  Result Value Ref Range Status   Specimen Description BLOOD SITE NOT SPECIFIED  Final   Special Requests   Final    BOTTLES DRAWN AEROBIC AND ANAEROBIC Blood Culture adequate volume   Culture   Final    NO GROWTH 4 DAYS Performed at [facility], [address], [place], [place] [address]    Report Status PENDING  Incomplete  Culture, blood (Routine X 2) w Reflex to ID Panel     Status: None (Preliminary result)   Collection Time: 05/31/23 12:14 AM   Specimen: BLOOD  Result  Value Ref Range Status   Specimen Description BLOOD SITE NOT SPECIFIED  Final   Special Requests   Final    BOTTLES DRAWN AEROBIC AND ANAEROBIC Blood Culture results may not be optimal due to an inadequate volume of blood received in culture bottles   Culture   Final    NO GROWTH 4 DAYS Performed at [facility], [address] [place], [place] [address]    Report Status PENDING  Incomplete  Resp panel by RT-PCR (RSV, Flu A&B, Covid)     Status: None   Collection Time: 05/31/23 12:14 AM   Specimen: Nasal Swab  Result Value Ref Range Status   SARS Coronavirus 2 by RT PCR NEGATIVE NEGATIVE Final   Influenza A by PCR NEGATIVE NEGATIVE Final   Influenza B by PCR NEGATIVE NEGATIVE Final    Comment: (NOTE) The Xpert Xpress SARS-CoV-2/FLU/RSV plus assay is intended as an aid in the diagnosis of influenza from Nasopharyngeal swab specimens and should not be used as a sole basis for treatment. Nasal washings and aspirates are unacceptable for Xpert Xpress SARS-CoV-2/FLU/RSV testing.  Fact Sheet for Patients: BloggerCourse.com  Fact Sheet for Healthcare Providers: SeriousBroker.it  This test is not yet approved or cleared by the [place] FDA and has been authorized for detection and/or diagnosis of SARS-CoV-2 by FDA under an Emergency Use Authorization (EUA). This EUA will remain in effect (meaning this test can be used) for the duration of the COVID-19 declaration under Section 564(b)(1) of the Act, 21 U.S.C. section 360bbb-3(b)(1), unless the authorization is terminated or revoked.     Resp Syncytial Virus by PCR NEGATIVE NEGATIVE Final    Comment: (NOTE) Fact Sheet for Patients: BloggerCourse.com  Fact Sheet for Healthcare Providers: SeriousBroker.it  This test is not yet approved or cleared by the [place] FDA and has been authorized for detection and/or  diagnosis of SARS-CoV-2 by FDA under an Emergency Use Authorization (EUA). This EUA will remain in effect (meaning this test can be used) for the duration of the COVID-19 declaration under Section 564(b)(1) of the Act, 21 U.S.C. section 360bbb-3(b)(1), unless the authorization is terminated or revoked.  Performed at [facility], [address], [place], [place] [address]   Urine Culture  Status: Abnormal   Collection Time: 05/31/23 12:51 AM   Specimen: Urine, Random  Result Value Ref Range Status   Specimen Description URINE, RANDOM  Final   Special Requests   Final    NONE Reflexed from [id number] Performed at [facility], [address], [place], [place] [address]    Culture MULTIPLE SPECIES PRESENT, SUGGEST RECOLLECTION (A)  Final   Report Status 06/01/2023 FINAL  Final    Radiology Report DG Chest Port 1 View Result Date: 06/03/2023 CLINICAL DATA:  [id number] with shortness of breath. EXAM: PORTABLE CHEST 1 VIEW COMPARISON:  Portable chest March 2 at 6:42 a.m. FINDINGS: 6:38 a.m.: 1 cm left upper lobe nodule again noted with adjacent fiducial marker and linear opacity. Patchy airspace disease in the right lung base is similar to the prior study with remaining lungs clear. Stable minimal right pleural effusion. The mediastinum is stable. There is aortic uncoiling and atherosclerosis, CABG changes. The heart is slightly enlarged. No vascular congestion is seen. Osteopenia with no new osseous findings. IMPRESSION: 1. Stable chest with patchy airspace disease in the right lung base and minimal right pleural effusion. 2. 1 cm left upper lobe nodule with adjacent fiducial marker and linear opacity. 3. Aortic atherosclerosis and CABG changes. Electronically Signed   By: Almira Bar M.D.   On: 06/03/2023 07:08     Signature  -   Huey Bienenstock M.D on 06/04/2023 at 12:53 PM   -  To page go to [email or web address]

## 2023-06-04 NOTE — Inpatient Diabetes Management (Signed)
 Inpatient Diabetes Program Recommendations  AACE/ADA: New Consensus Statement on Inpatient Glycemic Control (2015)  Target Ranges:  Prepandial:   less than 140 mg/dL      Peak postprandial:   less than 180 mg/dL (1-2 hours)      Critically ill patients:  140 - 180 mg/dL   Lab Results  Component Value Date   GLUCAP 198 (H) 06/04/2023   HGBA1C 6.0 (H) 05/31/2023    Review of Glycemic Control  Latest Reference Range & Units 06/03/23 07:48 06/03/23 11:47 06/03/23 17:00 06/03/23 21:26 06/04/23 07:57  Glucose-Capillary 70 - 99 mg/dL 528 (H) 413 (H) 244 (H) 283 (H) 198 (H)   Diabetes history: DM 2 Outpatient Diabetes medications: none listed Current orders for Inpatient glycemic control:  Novolog 0-9 units tid + hs  Solumedrol 60 mg Daily Note: glucose trends increase after PO intake and steroid dose  Inpatient Diabetes Program Recommendations:    -   Start Novolog 3 units tid meal coverage if eating > 50% of meals.  Thanks,  Christena Deem RN, MSN, BC-ADM Inpatient Diabetes Coordinator Team Pager [phone] (8a-5p)

## 2023-06-04 NOTE — Plan of Care (Signed)
 Problem: Education: Goal: Knowledge of General Education information will improve Description: Including pain rating scale, medication(s)/side effects and non-pharmacologic comfort measures 06/04/2023 0853 by Mariam Dollar, RN Outcome: Progressing 06/04/2023 0853 by Mariam Dollar, RN Outcome: Progressing   Problem: Health Behavior/Discharge Planning: Goal: Ability to manage health-related needs will improve 06/04/2023 0853 by Mariam Dollar, RN Outcome: Progressing 06/04/2023 0853 by Mariam Dollar, RN Outcome: Progressing   Problem: Clinical Measurements: Goal: Ability to maintain clinical measurements within normal limits will improve 06/04/2023 0853 by Mariam Dollar, RN Outcome: Progressing 06/04/2023 0853 by Mariam Dollar, RN Outcome: Progressing Goal: Will remain free from infection 06/04/2023 0853 by Mariam Dollar, RN Outcome: Progressing 06/04/2023 0853 by Mariam Dollar, RN Outcome: Progressing Goal: Diagnostic test results will improve 06/04/2023 0853 by Mariam Dollar, RN Outcome: Progressing 06/04/2023 0853 by Mariam Dollar, RN Outcome: Progressing Goal: Respiratory complications will improve 06/04/2023 0853 by Mariam Dollar, RN Outcome: Progressing 06/04/2023 0853 by Mariam Dollar, RN Outcome: Progressing Goal: Cardiovascular complication will be avoided 06/04/2023 0853 by Mariam Dollar, RN Outcome: Progressing 06/04/2023 0853 by Mariam Dollar, RN Outcome: Progressing   Problem: Activity: Goal: Risk for activity intolerance will decrease 06/04/2023 0853 by Mariam Dollar, RN Outcome: Progressing 06/04/2023 0853 by Mariam Dollar, RN Outcome: Progressing   Problem: Nutrition: Goal: Adequate nutrition will be maintained 06/04/2023 0853 by Mariam Dollar, RN Outcome: Progressing 06/04/2023 0853 by Mariam Dollar, RN Outcome: Progressing   Problem: Coping: Goal: Level of anxiety will decrease 06/04/2023  0853 by Mariam Dollar, RN Outcome: Progressing 06/04/2023 0853 by Mariam Dollar, RN Outcome: Progressing   Problem: Elimination: Goal: Will not experience complications related to bowel motility 06/04/2023 0853 by Mariam Dollar, RN Outcome: Progressing 06/04/2023 0853 by Mariam Dollar, RN Outcome: Progressing Goal: Will not experience complications related to urinary retention 06/04/2023 0853 by Mariam Dollar, RN Outcome: Progressing 06/04/2023 0853 by Mariam Dollar, RN Outcome: Progressing   Problem: Pain Managment: Goal: General experience of comfort will improve and/or be controlled 06/04/2023 0853 by Mariam Dollar, RN Outcome: Progressing 06/04/2023 0853 by Mariam Dollar, RN Outcome: Progressing   Problem: Safety: Goal: Ability to remain free from injury will improve 06/04/2023 0853 by Mariam Dollar, RN Outcome: Progressing 06/04/2023 0853 by Mariam Dollar, RN Outcome: Progressing   Problem: Skin Integrity: Goal: Risk for impaired skin integrity will decrease 06/04/2023 0853 by Mariam Dollar, RN Outcome: Progressing 06/04/2023 0853 by Mariam Dollar, RN Outcome: Progressing   Problem: Education: Goal: Ability to describe self-care measures that may prevent or decrease complications (Diabetes Survival Skills Education) will improve 06/04/2023 0853 by Mariam Dollar, RN Outcome: Progressing 06/04/2023 0853 by Mariam Dollar, RN Outcome: Progressing Goal: Individualized Educational Video(s) 06/04/2023 0853 by Mariam Dollar, RN Outcome: Progressing 06/04/2023 0853 by Mariam Dollar, RN Outcome: Progressing   Problem: Coping: Goal: Ability to adjust to condition or change in health will improve 06/04/2023 0853 by Mariam Dollar, RN Outcome: Progressing 06/04/2023 0853 by Mariam Dollar, RN Outcome: Progressing   Problem: Fluid Volume: Goal: Ability to maintain a balanced intake and output will improve 06/04/2023  0853 by Mariam Dollar, RN Outcome: Progressing 06/04/2023 0853 by Mariam Dollar, RN Outcome: Progressing   Problem: Health Behavior/Discharge Planning: Goal: Ability to identify and utilize available resources and services will improve 06/04/2023 0853 by Mariam Dollar, RN Outcome: Progressing 06/04/2023 0853 by  Mariam Dollar, RN Outcome: Progressing Goal: Ability to manage health-related needs will improve 06/04/2023 0853 by Mariam Dollar, RN Outcome: Progressing 06/04/2023 0853 by Mariam Dollar, RN Outcome: Progressing   Problem: Metabolic: Goal: Ability to maintain appropriate glucose levels will improve 06/04/2023 0853 by Mariam Dollar, RN Outcome: Progressing 06/04/2023 0853 by Mariam Dollar, RN Outcome: Progressing   Problem: Nutritional: Goal: Maintenance of adequate nutrition will improve 06/04/2023 0853 by Mariam Dollar, RN Outcome: Progressing 06/04/2023 0853 by Mariam Dollar, RN Outcome: Progressing Goal: Progress toward achieving an optimal weight will improve 06/04/2023 0853 by Mariam Dollar, RN Outcome: Progressing 06/04/2023 0853 by Mariam Dollar, RN Outcome: Progressing   Problem: Skin Integrity: Goal: Risk for impaired skin integrity will decrease 06/04/2023 0853 by Mariam Dollar, RN Outcome: Progressing 06/04/2023 0853 by Mariam Dollar, RN Outcome: Progressing   Problem: Tissue Perfusion: Goal: Adequacy of tissue perfusion will improve 06/04/2023 0853 by Mariam Dollar, RN Outcome: Progressing 06/04/2023 0853 by Mariam Dollar, RN Outcome: Progressing

## 2023-06-04 NOTE — Progress Notes (Signed)
 Physical Therapy Treatment Patient Details Name: Holly May MRN: [id number] DOB: [birth year] Today's Date: 06/04/2023   History of Present Illness 73 y.o. female presents 05/31/23 with worsening cough and chills.  Sepsis 2/2 UTI; PMH significant of hypertension, paroxysmal atrial fibrillation, PAF, severe COPD, diabetes mellitus type 2, lung adenocarcinoma on radiation therapy, tobacco abuse    PT Comments  Pt seen for PT tx with pt agreeable. Pt impulsively ambulates in room & bathroom without AD with decreased regard to lines/leads in room. Pt ambulates in hallway without AD with CGA, no overt LOB. Pt continues to require min assist & cuing for compensatory pattern when negotiation stairs. Pt would benefit from ongoing PT treatment to address balance, strength, gait, stair negotiation & safety awareness.    If plan is discharge home, recommend the following: Assistance with cooking/housework;Help with stairs or ramp for entrance   Can travel by private vehicle        Equipment Recommendations  Rollator (4 wheels)    Recommendations for Other Services       Precautions / Restrictions Precautions Precautions: Fall Precaution/Restrictions Comments: monitor O2 sats Restrictions Weight Bearing Restrictions Per Provider Order: No     Mobility  Bed Mobility Overal bed mobility: Modified Independent                  Transfers Overall transfer level: Needs assistance Equipment used: None Transfers: Sit to/from Stand Sit to Stand: Supervision           General transfer comment: STS with extra time to power up from lower EOB    Ambulation/Gait Ambulation/Gait assistance: Contact guard assist Gait Distance (Feet): 150 Feet Assistive device: None   Gait velocity: decreased     General Gait Details: Pt ambulates in room & bathroom without AD with supervision. Pt ambulates in hallway to stairs & back without AD with CGA with decreased weight shift to RLE, no knee  buckling.   Stairs Stairs: Yes Stairs assistance: Min assist Stair Management: No rails, One rail Right Number of Stairs: 3 (6" x 2 sets) General stair comments: Pt negotiates 3 steps without rails with pt electing to ascend with RLE & descend with LLE with pt experiencing LOB with min assist to correct. PT educates pt on compensatory pattern for stair negotiation with pt able to return demonstrate with ongoing cuing throughout activity, pt reaching for R rail when descending 2/2 decreased balance but still improved with compensatory pattern compared to first trial.   Wheelchair Mobility     Tilt Bed    Modified Rankin (Stroke Patients Only)       Balance Overall balance assessment: Needs assistance Sitting-balance support: Feet supported, No upper extremity supported Sitting balance-Leahy Scale: Good     Standing balance support: During functional activity, No upper extremity supported Standing balance-Leahy Scale: Fair                              Hotel manager: Impaired Factors Affecting Communication: Reduced clarity of speech (2/2 pt reporting she doesn't have her teeth in)  Cognition Arousal: Alert Behavior During Therapy: Impulsive   PT - Cognitive impairments: Safety/Judgement, No family/caregiver present to determine baseline                       PT - Cognition Comments: impulsive with mobility, ambulating to/from bathroom with disregard to lines/leads        Cueing Cueing  Techniques: Verbal cues  Exercises Other Exercises Other Exercises: Reviewed use if incentive spirometer & flutter valve with pt; when pt uses incentive spiromter correctly pt only able to achieve ~250 mL. Other Exercises: Pt on 3L/min via nasal cannula throughout session with SPO2 dropping as low as 87% at end of session with pt able to recover to 90% with rest; PT provides cuing for pursed lip breathing throughout session. Pt with audible  wheezing during gait but reports she feels fine/not SOB & notes she always wheezes. Other Exercises: Pt performs 5x STS from EOB without BUE support with supervision with focus on BLE strengthening & endurance training; pt requires slightly extra time to power up during each STS.    General Comments General comments (skin integrity, edema, etc.): pt with continent void, performs peri hygiene without assistance.      Pertinent Vitals/Pain Pain Assessment Pain Assessment: No/denies pain    Home Living                          Prior Function            PT Goals (current goals can now be found in the care plan section) Acute Rehab PT Goals Patient Stated Goal: to only use oxygen PRN PT Goal Formulation: With patient Time For Goal Achievement: 06/15/23 Potential to Achieve Goals: Good Progress towards PT goals: Progressing toward goals    Frequency    Min 1X/week      PT Plan      Co-evaluation              AM-PAC PT "6 Clicks" Mobility   Outcome Measure  Help needed turning from your back to your side while in a flat bed without using bedrails?: None Help needed moving from lying on your back to sitting on the side of a flat bed without using bedrails?: None Help needed moving to and from a bed to a chair (including a wheelchair)?: A Little Help needed standing up from a chair using your arms (e.g., wheelchair or bedside chair)?: A Little Help needed to walk in hospital room?: A Little Help needed climbing 3-5 steps with a railing? : A Little 6 Click Score: 20    End of Session Equipment Utilized During Treatment: Oxygen Activity Tolerance: Patient tolerated treatment well Patient left: in bed;with call bell/phone within reach   PT Visit Diagnosis: Unsteadiness on feet (R26.81)     Time: 4098-1191 PT Time Calculation (min) (ACUTE ONLY): 24 min  Charges:    $Gait Training: 8-22 mins $Therapeutic Activity: 8-22 mins PT General Charges $$  ACUTE PT VISIT: 1 Visit                     Aleda Grana, PT, DPT 06/04/23, 2:21 PM   Sandi Mariscal 06/04/2023, 2:20 PM

## 2023-06-05 ENCOUNTER — Encounter (HOSPITAL_COMMUNITY): Payer: Self-pay | Admitting: Internal Medicine

## 2023-06-05 DIAGNOSIS — J441 Chronic obstructive pulmonary disease with (acute) exacerbation: Secondary | ICD-10-CM | POA: Diagnosis not present

## 2023-06-05 DIAGNOSIS — J9621 Acute and chronic respiratory failure with hypoxia: Secondary | ICD-10-CM

## 2023-06-05 DIAGNOSIS — J189 Pneumonia, unspecified organism: Secondary | ICD-10-CM | POA: Diagnosis not present

## 2023-06-05 LAB — BASIC METABOLIC PANEL
Anion gap: 8 (ref 5–15)
Anion gap: 15 (ref 5–15)
BUN: 39 mg/dL — ABNORMAL HIGH (ref 8–23)
BUN: 46 mg/dL — ABNORMAL HIGH (ref 8–23)
CO2: 28 mmol/L (ref 22–32)
CO2: 29 mmol/L (ref 22–32)
Calcium: 8.8 mg/dL — ABNORMAL LOW (ref 8.9–10.3)
Calcium: 9.4 mg/dL (ref 8.9–10.3)
Chloride: 95 mmol/L — ABNORMAL LOW (ref 98–111)
Chloride: 90 mmol/L — ABNORMAL LOW (ref 98–111)
Creatinine, Ser: 1.2 mg/dL — ABNORMAL HIGH (ref 0.44–1.00)
Creatinine, Ser: 1.43 mg/dL — ABNORMAL HIGH (ref 0.44–1.00)
GFR, Estimated: 49 mL/min — ABNORMAL LOW (ref >60)
GFR, Estimated: 39 mL/min — ABNORMAL LOW (ref >60)
Glucose, Bld: 184 mg/dL — ABNORMAL HIGH (ref 70–99)
Glucose, Bld: 462 mg/dL — ABNORMAL HIGH (ref 70–99)
Potassium: 3.5 mmol/L (ref 3.5–5.1)
Potassium: 3.6 mmol/L (ref 3.5–5.1)
Sodium: 131 mmol/L — ABNORMAL LOW (ref 135–145)
Sodium: 134 mmol/L — ABNORMAL LOW (ref 135–145)

## 2023-06-05 LAB — GLUCOSE, CAPILLARY
Glucose-Capillary: 206 mg/dL — ABNORMAL HIGH (ref 70–99)
Glucose-Capillary: 211 mg/dL — ABNORMAL HIGH (ref 70–99)
Glucose-Capillary: 233 mg/dL — ABNORMAL HIGH (ref 70–99)
Glucose-Capillary: 321 mg/dL — ABNORMAL HIGH (ref 70–99)
Glucose-Capillary: 418 mg/dL — ABNORMAL HIGH (ref 70–99)
Glucose-Capillary: 479 mg/dL — ABNORMAL HIGH (ref 70–99)

## 2023-06-05 LAB — CBC WITH DIFFERENTIAL/PLATELET
Abs Immature Granulocytes: 0.08 10*3/uL — ABNORMAL HIGH (ref 0.00–0.07)
Basophils Absolute: 0 10*3/uL (ref 0.0–0.1)
Basophils Relative: 0 %
Eosinophils Absolute: 0 10*3/uL (ref 0.0–0.5)
Eosinophils Relative: 0 %
HCT: 37.3 % (ref 36.0–46.0)
Hemoglobin: 12.7 g/dL (ref 12.0–15.0)
Immature Granulocytes: 1 %
Lymphocytes Relative: 16 %
Lymphs Abs: 1.9 10*3/uL (ref 0.7–4.0)
MCH: 33 pg (ref 26.0–34.0)
MCHC: 34 g/dL (ref 30.0–36.0)
MCV: 96.9 fL (ref 80.0–100.0)
Monocytes Absolute: 0.8 10*3/uL (ref 0.1–1.0)
Monocytes Relative: 7 %
Neutro Abs: 9.1 10*3/uL — ABNORMAL HIGH (ref 1.7–7.7)
Neutrophils Relative %: 76 %
Platelets: 298 10*3/uL (ref 150–400)
RBC: 3.85 MIL/uL — ABNORMAL LOW (ref 3.87–5.11)
RDW: 14.1 % (ref 11.5–15.5)
WBC: 12 10*3/uL — ABNORMAL HIGH (ref 4.0–10.5)
nRBC: 0 % (ref 0.0–0.2)

## 2023-06-05 LAB — MAGNESIUM: Magnesium: 1.8 mg/dL (ref 1.7–2.4)

## 2023-06-05 LAB — CULTURE, BLOOD (ROUTINE X 2)
Culture: NO GROWTH
Culture: NO GROWTH
Special Requests: ADEQUATE

## 2023-06-05 LAB — C-REACTIVE PROTEIN: CRP: 0.7 mg/dL (ref <1.0)

## 2023-06-05 LAB — PROCALCITONIN: Procalcitonin: <0.1 ng/mL

## 2023-06-05 LAB — BRAIN NATRIURETIC PEPTIDE: B Natriuretic Peptide: 443.6 pg/mL — ABNORMAL HIGH (ref 0.0–100.0)

## 2023-06-05 MED ORDER — FUROSEMIDE 10 MG/ML IJ SOLN
40.0000 mg | Freq: Once | INTRAMUSCULAR | Status: AC
  Administered 2023-06-05: 40 mg via INTRAVENOUS
  Filled 2023-06-05: qty 4

## 2023-06-05 MED ORDER — DILTIAZEM HCL 25 MG/5ML IV SOLN: 5.0000 mg | INTRAVENOUS | Status: DC | PRN

## 2023-06-05 MED ORDER — MAGNESIUM CITRATE PO SOLN
1.0000 | Freq: Once | ORAL | Status: AC
  Administered 2023-06-05: 1 via ORAL
  Filled 2023-06-05: qty 296

## 2023-06-05 MED ORDER — INSULIN ASPART 100 UNIT/ML IJ SOLN
10.0000 [IU] | Freq: Once | INTRAMUSCULAR | Status: AC
  Administered 2023-06-05: 10 [IU] via SUBCUTANEOUS

## 2023-06-05 NOTE — Progress Notes (Signed)
 Occupational Therapy Treatment Patient Details Name: SHONDELL FABEL MRN: [id number] DOB: October 12, 1950 Today's Date: 06/05/2023   History of present illness 73 y.o. female presents 05/31/23 with worsening cough and chills.  Sepsis 2/2 UTI; PMH significant of hypertension, paroxysmal atrial fibrillation, PAF, severe COPD, diabetes mellitus type 2, lung adenocarcinoma on radiation therapy, tobacco abuse   OT comments  Pt in recliner on 2L O2. Pt able to sit on room air with O2 ranging from 94-96%. Pt completed 3 sets of 5X sit to stands with increased effort, breaks in between, on room air with O2 staying above 92%. Pt ambulated 300 feet with rollator and supervision on room air,  knees buckled several times but able to self correct, stayed above 92% until returning to room, brief moment where it read in the 80s but poor pleth, immediately jumped back to 90% once pleth improved, and back to 95% at end of session. Audible wheezing and congestion throughout session, Pt states this is her baseline, RN also states her O2 frequently drops to low 80% during OOB activity. Pt able to ambulate to restroom without AD, knees did buckle but able to self correct, instructed to use rollator at all times to maximize safety with mobility.  Pt doing well, needs frequent rest breaks due to SOB, able to complete ADLs at supervision for safety. Will continue to follow Pt acutely, recommendation for HHOT follow up still appropriate.       If plan is discharge home, recommend the following:  A little help with walking and/or transfers;A little help with bathing/dressing/bathroom;Assistance with cooking/housework;Direct supervision/assist for medications management;Direct supervision/assist for financial management;Assist for transportation   Equipment Recommendations  Tub/shower seat    Recommendations for Other Services      Precautions / Restrictions Precautions Precautions: Fall Recall of Precautions/Restrictions:  Intact Precaution/Restrictions Comments: monitor O2 sats Restrictions Weight Bearing Restrictions Per Provider Order: No       Mobility Bed Mobility Overal bed mobility: Modified Independent             General bed mobility comments: in recliner    Transfers Overall transfer level: Needs assistance Equipment used: None Transfers: Sit to/from Stand Sit to Stand: Supervision           General transfer comment: superivsion for safety, her knees did buckle several times with/without rollator, able to self correct without physical assist.     Balance Overall balance assessment: Needs assistance Sitting-balance support: No upper extremity supported, Feet supported Sitting balance-Leahy Scale: Good     Standing balance support: During functional activity, No upper extremity supported Standing balance-Leahy Scale: Fair Standing balance comment: able to stand unsupported, knees do buckle at times, instructed to use rollator for extra support.                           ADL either performed or assessed with clinical judgement   ADL Overall ADL's : Needs assistance/impaired                                       General ADL Comments: supervision for safety    Extremity/Trunk Assessment Upper Extremity Assessment Upper Extremity Assessment: Overall WFL for tasks assessed            Vision       Perception     Praxis     Communication Communication Communication: Impaired  Factors Affecting Communication: Reduced clarity of speech   Cognition Arousal: Alert Behavior During Therapy: Impulsive Cognition: No apparent impairments                               Following commands: Intact        Cueing   Cueing Techniques: Verbal cues  Exercises Other Exercises Other Exercises: STS 15X    Shoulder Instructions       General Comments Spo2 99% on 1.5L at rest. 96% on 2L ambulating.    Pertinent Vitals/ Pain        Pain Assessment Pain Assessment: No/denies pain  Home Living                                          Prior Functioning/Environment              Frequency  Min 1X/week        Progress Toward Goals  OT Goals(current goals can now be found in the care plan section)  Progress towards OT goals: Progressing toward goals  Acute Rehab OT Goals Patient Stated Goal: to return home OT Goal Formulation: With patient Time For Goal Achievement: 06/17/23 Potential to Achieve Goals: Good ADL Goals Pt Will Perform Upper Body Dressing: with modified independence;sitting Pt Will Perform Lower Body Dressing: with modified independence;sitting/lateral leans;sit to/from stand Pt Will Transfer to Toilet: with modified independence;ambulating;regular height toilet Additional ADL Goal #1: pt will verbalize x3 energy conservation strategies in prep for ADLs Additional ADL Goal #2: pt will tolerate OOB activity x32min wtih SpO2 above 90% in prep for ADLs  Plan      Co-evaluation                 AM-PAC OT "6 Clicks" Daily Activity     Outcome Measure   Help from another person eating meals?: None Help from another person taking care of personal grooming?: A Little Help from another person toileting, which includes using toliet, bedpan, or urinal?: A Little Help from another person bathing (including washing, rinsing, drying)?: A Little Help from another person to put on and taking off regular upper body clothing?: A Little Help from another person to put on and taking off regular lower body clothing?: A Little 6 Click Score: 19    End of Session Equipment Utilized During Treatment: Gait belt;Rollator (4 wheels);Oxygen  OT Visit Diagnosis: Muscle weakness (generalized) (M62.81)   Activity Tolerance Patient tolerated treatment well   Patient Left in chair;with call bell/phone within reach   Nurse Communication Mobility status        Time: 1432-1500 OT  Time Calculation (min): 28 min  Charges: OT General Charges $OT Visit: 1 Visit OT Treatments $Self Care/Home Management : 8-22 mins $Therapeutic Activity: 8-22 mins  Christie Viscomi, OTR/L   Alexis Goodell 06/05/2023, 3:18 PM

## 2023-06-05 NOTE — Inpatient Diabetes Management (Signed)
 Inpatient Diabetes Program Recommendations  AACE/ADA: New Consensus Statement on Inpatient Glycemic Control (2015)  Target Ranges:  Prepandial:   less than 140 mg/dL      Peak postprandial:   less than 180 mg/dL (1-2 hours)      Critically ill patients:  140 - 180 mg/dL   Lab Results  Component Value Date   GLUCAP 233 (H) 06/05/2023   HGBA1C 6.0 (H) 05/31/2023    Review of Glycemic Control  Latest Reference Range & Units 06/04/23 07:57 06/04/23 11:27 06/04/23 16:28 06/04/23 19:55 06/05/23 08:12  Glucose-Capillary 70 - 99 mg/dL 409 (H) 811 (H) 914 (H) 335 (H) 233 (H)   Diabetes history: DM 2 Outpatient Diabetes medications: none listed Current orders for Inpatient glycemic control:  Novolog 0-9 units tid + hs  Solumedrol 60 mg Daily Note: glucose trends increase after PO intake and steroid dose  Inpatient Diabetes Program Recommendations:    -   Start Novolog 3 units tid meal coverage if eating > 50% of meals.  Thanks,  Christena Deem RN, MSN, BC-ADM Inpatient Diabetes Coordinator Team Pager [phone] (8a-5p)

## 2023-06-05 NOTE — Progress Notes (Signed)
 Physical Therapy Treatment Patient Details Name: Holly May MRN: [id number] DOB: 1950/09/06 Today's Date: 06/05/2023   History of Present Illness 73 y.o. female presents 05/31/23 with worsening cough and chills.  Sepsis 2/2 UTI; PMH significant of hypertension, paroxysmal atrial fibrillation, PAF, severe COPD, diabetes mellitus type 2, lung adenocarcinoma on radiation therapy, tobacco abuse    PT Comments  Mobilizing well. SpO2 with gait better today at 96% on 2L supplemental O2. 99% on 1.5L at rest when PT entered room. Dyspneic at baseline per pt report. Supervision with mobility, rollator for increased support and energy conservation. Navigated stairs at min assist level, no evidence of buckling throughout session. Will have nephew and sister to assist her in/out of home. Patient will continue to benefit from skilled physical therapy services to further improve independence with functional mobility.     If plan is discharge home, recommend the following: Assistance with cooking/housework;Help with stairs or ramp for entrance   Can travel by private vehicle        Equipment Recommendations  Rollator (4 wheels)    Recommendations for Other Services       Precautions / Restrictions Precautions Precautions: Fall Recall of Precautions/Restrictions: Intact Precaution/Restrictions Comments: monitor O2 sats Restrictions Weight Bearing Restrictions Per Provider Order: No     Mobility  Bed Mobility Overal bed mobility: Modified Independent             General bed mobility comments: up in recliner    Transfers Overall transfer level: Needs assistance Equipment used: None Transfers: Sit to/from Stand Sit to Stand: Supervision           General transfer comment: Supervision for safety, managed lines/ leads. Steady upon rising. Performed from reciner x2    Ambulation/Gait Ambulation/Gait assistance: Supervision Gait Distance (Feet): 250 Feet Assistive device:  Rollator (4 wheels) Gait Pattern/deviations: Step-through pattern, Decreased stride length, Antalgic, Drifts right/left Gait velocity: decreased Gait velocity interpretation: 1.31 - 2.62 ft/sec, indicative of limited community ambulator   General Gait Details: No episodes of buckling noted today. Spo2 maintained 96% on 2L. Moderate dyspnea (suspect baseline per pt report.) She navigates well with rollator.   Stairs Stairs: Yes Stairs assistance: Min assist Stair Management: No rails, Alternating pattern, Step to pattern, Forwards Number of Stairs: 4 General stair comments: Hand held support, min assist to navigate steps. Educated on sequencing up with LLE, down with RLE due to hx of Rt buckling. Pt went up alternating fashion and step to going down. Again reviewed safety and recommendations. She will have nephew and sister to help in/out of house.   Wheelchair Mobility     Tilt Bed    Modified Rankin (Stroke Patients Only)       Balance Overall balance assessment: Needs assistance Sitting-balance support: Feet supported, No upper extremity supported Sitting balance-Leahy Scale: Good     Standing balance support: During functional activity, No upper extremity supported Standing balance-Leahy Scale: Fair Standing balance comment: More stable with support.                            Communication Communication Communication: Impaired Factors Affecting Communication: Reduced clarity of speech  Cognition Arousal: Alert Behavior During Therapy: Impulsive   PT - Cognitive impairments: Safety/Judgement, No family/caregiver present to determine baseline                       PT - Cognition Comments: impulsive with mobility cues for awareness  of lines/leads Following commands: Intact      Cueing Cueing Techniques: Verbal cues  Exercises      General Comments General comments (skin integrity, edema, etc.): Spo2 99% on 1.5L at rest. 96% on 2L  ambulating.      Pertinent Vitals/Pain Pain Assessment Pain Assessment: No/denies pain    Home Living                          Prior Function            PT Goals (current goals can now be found in the care plan section) Acute Rehab PT Goals Patient Stated Goal: to only use oxygen PRN PT Goal Formulation: With patient Time For Goal Achievement: 06/15/23 Potential to Achieve Goals: Good Progress towards PT goals: Progressing toward goals    Frequency    Min 1X/week      PT Plan      Co-evaluation              AM-PAC PT "6 Clicks" Mobility   Outcome Measure  Help needed turning from your back to your side while in a flat bed without using bedrails?: None Help needed moving from lying on your back to sitting on the side of a flat bed without using bedrails?: None Help needed moving to and from a bed to a chair (including a wheelchair)?: A Little Help needed standing up from a chair using your arms (e.g., wheelchair or bedside chair)?: A Little Help needed to walk in hospital room?: A Little Help needed climbing 3-5 steps with a railing? : A Little 6 Click Score: 20    End of Session Equipment Utilized During Treatment: Oxygen Activity Tolerance: Patient tolerated treatment well Patient left: with call bell/phone within reach;in chair   PT Visit Diagnosis: Unsteadiness on feet (R26.81);Other abnormalities of gait and mobility (R26.89)     Time: 1353-1411 PT Time Calculation (min) (ACUTE ONLY): 18 min  Charges:    $Gait Training: 8-22 mins PT General Charges $$ ACUTE PT VISIT: 1 Visit                     Kathlyn Sacramento, PT, DPT [facility]  Rehabilitation Services Physical Therapist Office: [phone] Website: [email or web address]    Berton Mount 06/05/2023, 2:47 PM

## 2023-06-05 NOTE — TOC Progression Note (Signed)
 Transition of Care [facility]) - Progression Note    Patient Details  Name: Holly May MRN: [id number] Date of Birth: [birth year]  [facility] CM/SW Contact  Tom-Johnson, Hershal Coria, RN Phone Number: 06/05/2023, 12:52 PM  Clinical Narrative:     CM notified by Amy with [facility] that they do not service patient's area of location. CM called in referral to [facility] and [facility] voiced acceptance, info on AVS.  CM will continue to follow.        Expected Discharge Plan and Services                                   HH Arranged: PT, RN, OT [facility] Agency: [facility] ([facility]) Date [facility] Agency Contacted: 06/05/23 Time HH Agency Contacted: 1245 Representative spoke with at [facility] Agency: Adele Dan   Social Determinants of Health (SDOH) Interventions SDOH Screenings   Food Insecurity: Food Insecurity Present (05/31/2023)  Housing: Low Risk  (06/02/2023)  Transportation Needs: No Transportation Needs (05/31/2023)  Utilities: Not At Risk (05/31/2023)  Alcohol Screen: Low Risk  (07/30/2022)  Depression (PHQ2-9): Low Risk  (08/21/2022)  Social Connections: Moderately Isolated (05/31/2023)  Tobacco Use: High Risk (03/18/2023)    Readmission Risk Interventions     No data to display

## 2023-06-05 NOTE — Plan of Care (Signed)

## 2023-06-05 NOTE — Progress Notes (Signed)
 PROGRESS NOTE                                                                                                                                                                                                             Patient Demographics:    Holly May, is a 73 y.o. female, DOB - 1951-03-09, WUJ:[id number]  Outpatient Primary MD for the patient is Pcp, No    LOS - 5  Admit date - 05/30/2023    Chief Complaint  Patient presents with   Chest Pain       Brief Narrative (HPI from H&P)   73 y.o. female with medical history significant of hypertension, paroxysmal atrial fibrillation, PAF, severe COPD, diabetes mellitus type 2, lung adenocarcinoma on radiation therapy, tobacco abuse presents with worsening cough and chills.  [name] with sepsis due to combination of UTI and community-acquired pneumonia, COPD exacerbation, acute on chronic diastolic CHF along with atrial fibrillation with RVR.   Subjective:   Pertinent dyspnea has improved, but remains significant with activity, cough still present.      Assessment  & Plan :    Sepsis secondary to urinary tract infection along with community-acquired pneumonia with COPD exacerbation, acute hypoxic respiratory failure due to community-acquired pneumonia and COPD exacerbation and a large component of acute on chronic diastolic CHF.  Sepsis present on admission has resolved, she has been placed on appropriate IV antibiotics which will be continued, low-dose steroids for COPD exacerbation, follow cultures, monitor bladder scans, continue nebulizer treatments, encouraged to sit in chair use I-S and flutter valve for pulmonary toiletry, advance activity and titrate down oxygen.  Strictly counseled to quit smoking.  Initial viral screen negative.   -.Continue with IV steroids . -Continue with IV antibiotics .-She was encouraged to use incentive spirometer and flutter valve specially with  some evidence of mucous plugging, started on Mucinex. -Continue with current dose IV Lasix as long blood pressure and renal function tolerates. -Will ambulate today to see if she qualifies for home oxygen.  SpO2: 99 % O2 Flow Rate (L/min): 2 L/min     Acute on chronic diastolic CHF.   -Echo noted EF preserved around 55%.  Diurese and monitor, dose diuretics daily.  Monitor renal function closely.  Blood pressure and renal function seems stable, will give 40 mg of IV Lasix today.  AKI  On admission creatinine noted to be 1.2 with BUN 25.  Baseline creatinine previously noted to be 0.88-0.95.  Currently stable will continue to monitor ongoing diuresis.   Prolonged QT interval Acute.  QTc noted to be prolonged at 512.  Replace electrolytes, Celexa and mirtazapine held, QTc stable on repeat EKG 06/01/2022 at 470 ms, echo pending   Essential hypertension On low-dose beta-blocker now on oral Cardizem along with as needed IV hydralazine.  Monitor and adjust.   Hypomagnesemia.  Replaced.    Paroxysmal atrial fibrillation on chronic anticoagulation Went in RVR night of 05/31/2023, required Cardizem drip, on low-dose oral beta-blocker which will be continued, switch IV Cardizem to oral, TSH mildly suppressed likely sick euthyroid, free T4 stable, continue Eliquis, stable echo.   History of lung cancer with evidence of lung nodules on CT scan. Patient status post radiation treatments. -Continue outpatient follow-up with oncology and pulmonary outpatient to be arranged by PCP.   Left hip pain secondary to fall Prior to arrival patient reported missing a step and falling landing on her left hip.  On physical exam significant bruising around the left hip.  X-ray of the pelvis negative, pain has improved, PT and monitor.   Hyperlipidemia -Continue pharmacy substitution of pravastatin for home lovastatin   GERD -Pharmacy substitution of Protonix   Tobacco abuse Patient reports still smoking.   Consulted to quit. -Nicotine patch offered   Controlled diabetes mellitus type 2, without long-term use of insulin Currently on ISS  CBG (last 3)  Recent Labs    06/04/23 1955 06/05/23 0812 06/05/23 1133  GLUCAP 335* 233* 206*   Lab Results  Component Value Date   HGBA1C 6.0 (H) 05/31/2023         Condition - Extremely Guarded  Family Communication  :   None  Code Status :  Full  Consults  :  None  PUD Prophylaxis : PPI   Procedures  :     TTE -   1. Left ventricular ejection fraction, by estimation, is 55 to 60%. The left ventricle has normal function. The left ventricle has no regional wall motion abnormalities. Left ventricular diastolic parameters are consistent with Grade I diastolic dysfunction (impaired relaxation).  2. Right ventricular systolic function is low normal. The right ventricular size is normal.  3. Left atrial size was mild to moderately dilated.  4. The mitral valve is normal in structure. No evidence of mitral valve regurgitation. No evidence of mitral stenosis.  5. The aortic valve is normal in structure. Aortic valve regurgitation is not visualized. No aortic stenosis is present.  6. The inferior vena cava is normal in size with greater than 50% respiratory variability, suggesting right atrial pressure of 3 mmHg.  CTA - Stable left upper lobe spiculated nodule consistent with the given clinical history. Nodular density in the right lower lobe measuring up to 15 mm. This is new from the prior exam but given the adjacent mucous plugging and inflammatory change this may be inflammatory in nature. Short-term follow-up is recommended. Neoplasm is not excluded on the basis of this exam. Small patchy ground-glass opacities in the right upper lobe likely postinflammatory in nature. No evidence of pulmonary embolism. Aortic Atherosclerosis (ICD10-I70.0) and Emphysema (ICD10-J43.9)      Disposition Plan  :    Status is: Inpatient    DVT Prophylaxis  :      apixaban (ELIQUIS) tablet 5 mg     Lab Results  Component Value Date   PLT 298  06/05/2023    Diet :  Diet Order             Diet Heart Room service appropriate? Yes; Fluid consistency: Thin  Diet effective now                    Inpatient Medications  Scheduled Meds:  amLODipine  10 mg Oral Daily   apixaban  5 mg Oral BID   arformoterol  15 mcg Nebulization BID   bisoprolol  10 mg Oral Daily   budesonide (PULMICORT) nebulizer solution  0.5 mg Nebulization BID   diltiazem  90 mg Oral Q8H   docusate sodium  200 mg Oral BID   gabapentin  300 mg Oral Daily   gabapentin  300 mg Oral QHS   guaiFENesin  600 mg Oral BID   insulin aspart  0-5 Units Subcutaneous QHS   insulin aspart  0-9 Units Subcutaneous TID WC   ipratropium-albuterol  3 mL Nebulization QID   methylPREDNISolone (SOLU-MEDROL) injection  60 mg Intravenous Q24H   nicotine  21 mg Transdermal Daily   oxybutynin  5 mg Oral BID   pantoprazole  40 mg Oral Daily   pravastatin  40 mg Oral q1800   sodium chloride flush  3 mL Intravenous Q12H   Continuous Infusions:   PRN Meds:.acetaminophen **OR** acetaminophen, hydrALAZINE, levalbuterol, trimethobenzamide    Objective:   Vitals:   06/05/23 0500 06/05/23 0556 06/05/23 0800 06/05/23 1134  BP:  (!) 135/116 122/88 138/79  Pulse: 90  92 71  Resp: 12  15 13   Temp:   98.5 F (36.9 C) 98.2 F (36.8 C)  TempSrc:   Oral Oral  SpO2: 95%  100% 99%  Weight:      Height:        Wt Readings from Last 3 Encounters:  05/30/23 64 kg  03/18/23 64 kg  02/11/23 63.5 kg     Intake/Output Summary (Last 24 hours) at 06/05/2023 1339 Last data filed at 06/05/2023 1000 Gross per 24 hour  Intake 560 ml  Output 900 ml  Net -340 ml     Physical Exam   Awake Alert, Oriented X 3, trembly frail, deconditioned, appears older than stated age Symmetrical Chest wall movement, improved air entry today RRR,No Gallops,Rubs or new Murmurs, No Parasternal Heave +ve  B.Sounds, Abd Soft, No tenderness, No rebound - guarding or rigidity. No Cyanosis, Clubbing or edema, No new Rash or bruise        Data Review:    Recent Labs  Lab 06/01/23 0611 06/02/23 0452 06/03/23 0459 06/04/23 0526 06/05/23 0602  WBC 7.7 11.4* 11.1* 11.0* 12.0*  HGB 11.0* 11.2* 11.4* 12.1 12.7  HCT 33.8* 34.4* 34.6* 36.1 37.3  PLT 204 226 249 270 298  MCV 101.5* 100.0 100.6* 98.4 96.9  MCH 33.0 32.6 33.1 33.0 33.0  MCHC 32.5 32.6 32.9 33.5 34.0  RDW 14.1 14.2 14.1 14.2 14.1  LYMPHSABS  --  1.8 1.8 1.6 1.9  MONOABS  --  0.6 0.8 0.8 0.8  EOSABS  --  0.0 0.0 0.0 0.0  BASOSABS  --  0.0 0.0 0.0 0.0    Recent Labs  Lab 05/31/23 0026 05/31/23 0349 05/31/23 1445 05/31/23 1828 06/01/23 0611 06/02/23 0452 06/03/23 0459 06/04/23 0526 06/05/23 0602  NA  --   --   --   --  139 137 136 132* 131*  K  --   --   --   --  3.7 3.3* 4.9 4.2  3.5  CL  --   --   --   --  95* 100 101 95* 95*  CO2  --   --   --   --  28 28 24 26 28   ANIONGAP  --   --   --   --  16* 9 11 11 8   GLUCOSE  --   --   --   --  161* 128* 129* 182* 184*  BUN  --   --   --   --  18 19 24* 27* 39*  CREATININE  --   --   --   --  1.16* 0.99 0.99 0.95 1.20*  CRP  --   --   --   --  7.0* 3.2* 2.1* 1.4* 0.7  PROCALCITON  --   --    < >  --  0.25 0.18 <0.10 <0.10 <0.10  LATICACIDVEN 3.7* 1.8  --   --   --   --   --   --   --   TSH  --   --   --   --  0.294*  --   --   --   --   HGBA1C  --   --   --  6.0*  --   --   --   --   --   BNP  --   --   --   --  820.0* 466.0* 438.9* 454.4* 443.6*  MG  --   --   --   --  1.6* 2.0 1.7 1.9 1.8  CALCIUM  --   --   --   --  8.8* 8.6* 8.4* 9.3 8.8*   < > = values in this interval not displayed.      Recent Labs  Lab 05/31/23 0026 05/31/23 0349 05/31/23 1445 05/31/23 1828 06/01/23 4098 06/02/23 0452 06/03/23 0459 06/04/23 0526 06/05/23 0602  CRP  --   --   --   --  7.0* 3.2* 2.1* 1.4* 0.7  PROCALCITON  --   --    < >  --  0.25 0.18 <0.10 <0.10 <0.10   LATICACIDVEN 3.7* 1.8  --   --   --   --   --   --   --   TSH  --   --   --   --  0.294*  --   --   --   --   HGBA1C  --   --   --  6.0*  --   --   --   --   --   BNP  --   --   --   --  820.0* 466.0* 438.9* 454.4* 443.6*  MG  --   --   --   --  1.6* 2.0 1.7 1.9 1.8  CALCIUM  --   --   --   --  8.8* 8.6* 8.4* 9.3 8.8*   < > = values in this interval not displayed.    --------------------------------------------------------------------------------------------------------------- No results found for: "CHOL", "HDL", "LDLCALC", "LDLDIRECT", "TRIG", "CHOLHDL"  Lab Results  Component Value Date   HGBA1C 6.0 (H) 05/31/2023   No results for input(s): "TSH", "T4TOTAL", "FREET4", "T3FREE", "THYROIDAB" in the last 72 hours.  No results for input(s): "VITAMINB12", "FOLATE", "FERRITIN", "TIBC", "IRON", "RETICCTPCT" in the last 72 hours. ------------------------------------------------------------------------------------------------------------------ Cardiac Enzymes No results for input(s): "CKMB", "TROPONINI", "MYOGLOBIN" in the last 168 hours.  Invalid input(s): "CK"  Micro Results Recent Results (from the  past 240 hours)  Culture, blood (Routine X 2) w Reflex to ID Panel     Status: None   Collection Time: 05/31/23 12:14 AM   Specimen: BLOOD  Result Value Ref Range Status   Specimen Description BLOOD SITE NOT SPECIFIED  Final   Special Requests   Final    BOTTLES DRAWN AEROBIC AND ANAEROBIC Blood Culture adequate volume   Culture   Final    NO GROWTH 5 DAYS Performed at [facility], [address], [place], [place] [address]    Report Status 06/05/2023 FINAL  Final  Culture, blood (Routine X 2) w Reflex to ID Panel     Status: None   Collection Time: 05/31/23 12:14 AM   Specimen: BLOOD  Result Value Ref Range Status   Specimen Description BLOOD SITE NOT SPECIFIED  Final   Special Requests   Final    BOTTLES DRAWN AEROBIC AND ANAEROBIC Blood Culture results may not be  optimal due to an inadequate volume of blood received in culture bottles   Culture   Final    NO GROWTH 5 DAYS Performed at [facility], [address], [place], [place] [address]    Report Status 06/05/2023 FINAL  Final  Resp panel by RT-PCR (RSV, Flu A&B, Covid)     Status: None   Collection Time: 05/31/23 12:14 AM   Specimen: Nasal Swab  Result Value Ref Range Status   SARS Coronavirus 2 by RT PCR NEGATIVE NEGATIVE Final   Influenza A by PCR NEGATIVE NEGATIVE Final   Influenza B by PCR NEGATIVE NEGATIVE Final    Comment: (NOTE) The Xpert Xpress SARS-CoV-2/FLU/RSV plus assay is intended as an aid in the diagnosis of influenza from Nasopharyngeal swab specimens and should not be used as a sole basis for treatment. Nasal washings and aspirates are unacceptable for Xpert Xpress SARS-CoV-2/FLU/RSV testing.  Fact Sheet for Patients: BloggerCourse.com  Fact Sheet for Healthcare Providers: SeriousBroker.it  This test is not yet approved or cleared by the [place] FDA and has been authorized for detection and/or diagnosis of SARS-CoV-2 by FDA under an Emergency Use Authorization (EUA). This EUA will remain in effect (meaning this test can be used) for the duration of the COVID-19 declaration under Section 564(b)(1) of the Act, 21 U.S.C. section 360bbb-3(b)(1), unless the authorization is terminated or revoked.     Resp Syncytial Virus by PCR NEGATIVE NEGATIVE Final    Comment: (NOTE) Fact Sheet for Patients: BloggerCourse.com  Fact Sheet for Healthcare Providers: SeriousBroker.it  This test is not yet approved or cleared by the [place] FDA and has been authorized for detection and/or diagnosis of SARS-CoV-2 by FDA under an Emergency Use Authorization (EUA). This EUA will remain in effect (meaning this test can be used) for the duration of the COVID-19  declaration under Section 564(b)(1) of the Act, 21 U.S.C. section 360bbb-3(b)(1), unless the authorization is terminated or revoked.  Performed at [facility], [address] [place], [place] [address]   Urine Culture     Status: Abnormal   Collection Time: 05/31/23 12:51 AM   Specimen: Urine, Random  Result Value Ref Range Status   Specimen Description URINE, RANDOM  Final   Special Requests   Final    NONE Reflexed from [id number] Performed at [facility], [address] [place], [place] [address]    Culture MULTIPLE SPECIES PRESENT, SUGGEST RECOLLECTION (A)  Final   Report Status 06/01/2023 FINAL  Final  Radiology Report No results found.    Signature  -   Huey Bienenstock M.D on 06/05/2023 at 1:39 PM   -  To page go to [email or web address]

## 2023-06-06 ENCOUNTER — Other Ambulatory Visit (HOSPITAL_COMMUNITY): Payer: Self-pay

## 2023-06-06 DIAGNOSIS — N390 Urinary tract infection, site not specified: Secondary | ICD-10-CM | POA: Diagnosis not present

## 2023-06-06 DIAGNOSIS — J9601 Acute respiratory failure with hypoxia: Secondary | ICD-10-CM | POA: Diagnosis not present

## 2023-06-06 DIAGNOSIS — A419 Sepsis, unspecified organism: Secondary | ICD-10-CM | POA: Diagnosis not present

## 2023-06-06 DIAGNOSIS — J189 Pneumonia, unspecified organism: Secondary | ICD-10-CM | POA: Diagnosis not present

## 2023-06-06 LAB — CBC WITH DIFFERENTIAL/PLATELET
Abs Immature Granulocytes: 0.19 10*3/uL — ABNORMAL HIGH (ref 0.00–0.07)
Basophils Absolute: 0 10*3/uL (ref 0.0–0.1)
Basophils Relative: 0 %
Eosinophils Absolute: 0 10*3/uL (ref 0.0–0.5)
Eosinophils Relative: 0 %
HCT: 36.3 % (ref 36.0–46.0)
Hemoglobin: 12.3 g/dL (ref 12.0–15.0)
Immature Granulocytes: 1 %
Lymphocytes Relative: 16 %
Lymphs Abs: 2.1 10*3/uL (ref 0.7–4.0)
MCH: 32.3 pg (ref 26.0–34.0)
MCHC: 33.9 g/dL (ref 30.0–36.0)
MCV: 95.3 fL (ref 80.0–100.0)
Monocytes Absolute: 1.4 10*3/uL — ABNORMAL HIGH (ref 0.1–1.0)
Monocytes Relative: 11 %
Neutro Abs: 9.5 10*3/uL — ABNORMAL HIGH (ref 1.7–7.7)
Neutrophils Relative %: 72 %
Platelets: 344 10*3/uL (ref 150–400)
RBC: 3.81 MIL/uL — ABNORMAL LOW (ref 3.87–5.11)
RDW: 13.9 % (ref 11.5–15.5)
WBC: 13.3 10*3/uL — ABNORMAL HIGH (ref 4.0–10.5)
nRBC: 0 % (ref 0.0–0.2)

## 2023-06-06 LAB — BASIC METABOLIC PANEL
Anion gap: 8 (ref 5–15)
BUN: 48 mg/dL — ABNORMAL HIGH (ref 8–23)
CO2: 30 mmol/L (ref 22–32)
Calcium: 8.9 mg/dL (ref 8.9–10.3)
Chloride: 95 mmol/L — ABNORMAL LOW (ref 98–111)
Creatinine, Ser: 1.09 mg/dL — ABNORMAL HIGH (ref 0.44–1.00)
GFR, Estimated: 55 mL/min — ABNORMAL LOW (ref >60)
Glucose, Bld: 135 mg/dL — ABNORMAL HIGH (ref 70–99)
Potassium: 3.3 mmol/L — ABNORMAL LOW (ref 3.5–5.1)
Sodium: 133 mmol/L — ABNORMAL LOW (ref 135–145)

## 2023-06-06 LAB — GLUCOSE, CAPILLARY: Glucose-Capillary: 203 mg/dL — ABNORMAL HIGH (ref 70–99)

## 2023-06-06 LAB — PROCALCITONIN: Procalcitonin: <0.1 ng/mL

## 2023-06-06 MED ORDER — DILTIAZEM HCL ER COATED BEADS 240 MG PO CP24
240.0000 mg | ORAL_CAPSULE | Freq: Every day | ORAL | 0 refills | Status: DC
Start: 2023-06-06 — End: 2023-08-29
  Filled 2023-06-06: qty 30, 30d supply, fill #0

## 2023-06-06 MED ORDER — PREDNISONE 10 MG (21) PO TBPK
ORAL_TABLET | ORAL | 0 refills | Status: DC
  Filled 2023-06-06: qty 21, 6d supply, fill #0

## 2023-06-06 MED ORDER — PANTOPRAZOLE SODIUM 40 MG PO TBEC
40.0000 mg | DELAYED_RELEASE_TABLET | Freq: Every day | ORAL | 0 refills | Status: DC
  Filled 2023-06-06: qty 30, 30d supply, fill #0

## 2023-06-06 NOTE — Discharge Summary (Signed)
 Physician Discharge Summary  Holly May UJW:[id number] DOB: 1950/04/17 DOA: 05/30/2023  PCP: Pcp, No  Admit date: 05/30/2023 Discharge date: 06/06/2023  Admitted From: (Home) Disposition:  (Home )  Recommendations for Outpatient Follow-up:  Follow up with PCP in 1-2 weeks Please obtain BMP/CBC in one week Please continue counseling to stop smoking  Home Health: (YES)  Diet recommendation: Heart Health  Brief/Interim Summary:  73 y.o. female with medical history significant of hypertension, paroxysmal atrial fibrillation, PAF, severe COPD, diabetes mellitus type 2, lung adenocarcinoma on radiation therapy, tobacco abuse presents with worsening cough and chills.  [name] with sepsis due to combination of UTI and community-acquired pneumonia, COPD exacerbation, acute on chronic diastolic CHF along with atrial fibrillation with RVR.   Sepsis, present on admission secondary to UTI and community-acquired pneumonia -She has been on admission, resolved at time of discharge, she was treated appropriately with IV antibiotics, x-ray significant with opacity at the right lung base, treated with appropriate antibiotic during hospital stay, no further antibiotic needed at time of discharge   Acute respiratory failure with hypoxia, due to community-acquired pneumonia, COP exacerbation and acute on chronic diastolic CHF -She is with hypoxia upon presentation, increased work of breathing, this has much improved, no oxygen requirement at time of discharge . -She was treated with scheduled nebs treatment, as needed albuterol, and IV steroids, respiratory status back to baseline, she will be discharged on prednisone taper .   Acute on chronic diastolic CHF.   -Echo noted EF preserved around 55%.  Was treated with IV diuresis, euvolemic at time of discharge    AKI On admission creatinine noted to be 1.2 with BUN 25.  Baseline creatinine previously noted to be 0.88-0.95.  Currently stable will continue to  monitor ongoing diuresis.   Prolonged QT interval Acute.  QTc noted to be prolonged at 512.  Replaced electrolytes, Celexa and mirtazapine held initially, QTc stable on repeat EKG 06/01/2022 at 470 ms,    Essential hypertension On low-dose beta-blocker now on oral Cardizem for better heart rate control, Norvasc has been changed, continue with home dose telmisartan    Hypomagnesemia. Hypokalemia  -replaced    Paroxysmal atrial fibrillation on chronic anticoagulation Went in RVR, initially required Cardizem drip, transitioned to short acting Cardizem, she remained with intermittent A-fib with RVR during hospital stay, so Cardizem regimen will be added on discharge, she will be discharged on Cardizem CD. -Continue with home dose beta-blockers -continue with Eliquis for anticoagulation -TSH mildly suppressed likely sick euthyroid, free T4 stable, continue Eliquis, stable echo.   History of lung cancer with evidence of lung nodules on CT scan. Patient status post radiation treatments. -Patient was instructed to follow with her oncology team and pulmonary team as an outpatient, and she was counseled at length to stop smoking   Left hip pain secondary to fall Prior to arrival patient reported missing a step and falling landing on her left hip.  On physical exam significant bruising around the left hip.  X-ray of the pelvis negative, pain has improved, PT and monitor.   Hyperlipidemia -Continue with home statin   GERD -Pharmacy substitution of Protonix   Tobacco abuse She was counseled at length, and determined to stop smoking yet    Controlled diabetes mellitus type 2, without long-term use of insulin CBG were elevated during hospital stay, but overall her A1c is acceptable at 6, so I will hold on initiating any hypoglycemic medications as she will be on short steroid taper and I  would anticipate her CBG to be back to normal after stopping steroids.   Discharge Diagnoses:  Principal  Problem:   Sepsis (HCC) Active Problems:   Urinary tract infection   CAP (community acquired pneumonia)   COPD exacerbation (HCC)   Acute respiratory failure with hypoxia (HCC)   Renal insufficiency   Prolonged QT interval   Essential hypertension   History of lung cancer   DM type 2 (diabetes mellitus, type 2) (HCC)   Left hip pain   Fall at home, initial encounter   GERD (gastroesophageal reflux disease)   Cigarette smoker    Discharge Instructions  Discharge Instructions     Diet - low sodium heart healthy   Complete by: As directed    Discharge instructions   Complete by: As directed    Follow with Primary MD Pcp in 7 days   Get CBC, CMP, 2 view Chest X ray checked  by Primary MD next visit.    Activity: As tolerated with Full fall precautions use walker/cane & assistance as needed   Disposition Home    Diet: Heart Healthy /Carb modified   On your next visit with your primary care physician please Get Medicines reviewed and adjusted.   Please request your Prim.MD to go over all Hospital Tests and Procedure/Radiological results at the follow up, please get all Hospital records sent to your Prim MD by signing hospital release before you go home.   If you experience worsening of your admission symptoms, develop shortness of breath, life threatening emergency, suicidal or homicidal thoughts you must seek medical attention immediately by calling 911 or calling your MD immediately  if symptoms less severe.  You Must read complete instructions/literature along with all the possible adverse reactions/side effects for all the Medicines you take and that have been prescribed to you. Take any new Medicines after you have completely understood and accpet all the possible adverse reactions/side effects.   Do not drive, operating heavy machinery, perform activities at heights, swimming or participation in water activities or provide baby sitting services if your were admitted  for syncope or siezures until you have seen by Primary MD or a Neurologist and advised to do so again.  Do not drive when taking Pain medications.    Do not take more than prescribed Pain, Sleep and Anxiety Medications  Special Instructions: If you have smoked or chewed Tobacco  in the last 2 yrs please stop smoking, stop any regular Alcohol  and or any Recreational drug use.  Wear Seat belts while driving.   Please note  You were cared for by a hospitalist during your hospital stay. If you have any questions about your discharge medications or the care you received while you were in the hospital after you are discharged, you can call the unit and asked to speak with the hospitalist on call if the hospitalist that took care of you is not available. Once you are discharged, your primary care physician will handle any further medical issues. Please note that NO REFILLS for any discharge medications will be authorized once you are discharged, as it is imperative that you return to your primary care physician (or establish a relationship with a primary care physician if you do not have one) for your aftercare needs so that they can reassess your need for medications and monitor your lab values.   Increase activity slowly   Complete by: As directed       Allergies as of 06/06/2023   No  Known Allergies      Medication List     STOP taking these medications    amLODipine 5 MG tablet Commonly known as: NORVASC   omeprazole 20 MG capsule Commonly known as: PRILOSEC Replaced by: pantoprazole 40 MG tablet       TAKE these medications    albuterol 108 (90 Base) MCG/ACT inhaler Commonly known as: VENTOLIN HFA Inhale 2 puffs into the lungs every 4 (four) hours as needed for wheezing or shortness of breath (cough, shortness of breath or wheezing.). What changed: reasons to take this   alendronate 70 MG tablet Commonly known as: FOSAMAX Take 70 mg by mouth once a week. Wednesday    apixaban 5 MG Tabs tablet Commonly known as: Eliquis Take 1 tablet (5 mg total) by mouth 2 (two) times daily.   bisoprolol 5 MG tablet Commonly known as: ZEBETA Take 1 tablet (5 mg total) by mouth daily.   citalopram 20 MG tablet Commonly known as: CELEXA Take 1 tablet (20 mg total) by mouth daily.   diltiazem 240 MG 24 hr capsule Commonly known as: Cartia XT Take 1 capsule (240 mg total) by mouth daily.   fluticasone furoate-vilanterol 100-25 MCG/ACT Aepb Commonly known as: BREO ELLIPTA Inhale 1 puff into the lungs daily.   gabapentin 300 MG capsule Commonly known as: NEURONTIN Take 300 mg by mouth daily.   ipratropium-albuterol 0.5-2.5 (3) MG/3ML Soln Commonly known as: DUONEB Take 3 mLs by nebulization every 6 (six) hours as needed (wheezing, shortness of breath).   lovastatin 40 MG tablet Commonly known as: MEVACOR Take 40 mg by mouth daily.   mirtazapine 15 MG tablet Commonly known as: REMERON Take 15 mg by mouth at bedtime.   olmesartan 40 MG tablet Commonly known as: BENICAR Take 1 tablet (40 mg total) by mouth daily.   oxybutynin 5 MG tablet Commonly known as: DITROPAN Take 5 mg by mouth 2 (two) times daily.   pantoprazole 40 MG tablet Commonly known as: PROTONIX Take 1 tablet (40 mg total) by mouth daily. Start taking on: June 07, 2023 Replaces: omeprazole 20 MG capsule   predniSONE 10 MG (21) Tbpk tablet Commonly known as: STERAPRED UNI-PAK 21 TAB Use per Museum/gallery exhibitions officer  (From admission, onward)           Start     Ordered   06/04/23 1520  For home use only DME 4 wheeled rolling walker with seat  Once       Question:  Patient needs a walker to treat with the following condition  Answer:  Gait instability   06/04/23 1520            Follow-up Information     [facility], [facility] Follow up.   Why: Someone will call you to schedule first home visit. Contact  information: [address] [place] [address] [phone]                No Known Allergies  Consultations: None   Procedures/Studies: DG Chest Port 1 View Result Date: 06/03/2023 CLINICAL DATA:  [id number] with shortness of breath. EXAM: PORTABLE CHEST 1 VIEW COMPARISON:  Portable chest March 2 at 6:42 a.m. FINDINGS: 6:38 a.m.: 1 cm left upper lobe nodule again noted with adjacent fiducial marker and linear opacity. Patchy airspace disease in the right lung base is similar to the prior study with remaining lungs  clear. Stable minimal right pleural effusion. The mediastinum is stable. There is aortic uncoiling and atherosclerosis, CABG changes. The heart is slightly enlarged. No vascular congestion is seen. Osteopenia with no new osseous findings. IMPRESSION: 1. Stable chest with patchy airspace disease in the right lung base and minimal right pleural effusion. 2. 1 cm left upper lobe nodule with adjacent fiducial marker and linear opacity. 3. Aortic atherosclerosis and CABG changes. Electronically Signed   By: Almira Bar M.D.   On: 06/03/2023 07:08   ECHOCARDIOGRAM COMPLETE Result Date: 06/02/2023    ECHOCARDIOGRAM REPORT   Patient Name:   AUDREA BOLTE Date of Exam: 06/02/2023 Medical Rec #:  [id number]     Height:       67.0 in Accession #:    [id number]    Weight:       141.1 lb Date of Birth:  1950-11-23    BSA:          1.744 m Patient Age:    72 years      BP:           162/97 mmHg Patient Gender: F             HR:           60 bpm. Exam Location:  Inpatient Procedure: 2D Echo, Cardiac Doppler and Color Doppler (Both Spectral and Color            Flow Doppler were utilized during procedure). Indications:    Atrial Fibrillation  History:        Patient has prior history of Echocardiogram examinations, most                 recent 07/17/2022. COPD; Risk Factors:Hypertension, Diabetes and                 Current Smoker.  Sonographer:    Karma Ganja Referring Phys: [facility]   Sonographer Comments: Technically difficult study due to poor echo windows. Image acquisition challenging due to patient body habitus, Image acquisition challenging due to COPD and Image acquisition challenging due to respiratory motion. IMPRESSIONS  1. Left ventricular ejection fraction, by estimation, is 55 to 60%. The left ventricle has normal function. The left ventricle has no regional wall motion abnormalities. Left ventricular diastolic parameters are consistent with Grade I diastolic dysfunction (impaired relaxation).  2. Right ventricular systolic function is low normal. The right ventricular size is normal.  3. Left atrial size was mild to moderately dilated.  4. The mitral valve is normal in structure. No evidence of mitral valve regurgitation. No evidence of mitral stenosis.  5. The aortic valve is normal in structure. Aortic valve regurgitation is not visualized. No aortic stenosis is present.  6. The inferior vena cava is normal in size with greater than 50% respiratory variability, suggesting right atrial pressure of 3 mmHg. FINDINGS  Left Ventricle: Left ventricular ejection fraction, by estimation, is 55 to 60%. The left ventricle has normal function. The left ventricle has no regional wall motion abnormalities. Definity contrast agent was given IV to delineate the left ventricular  endocardial borders. The left ventricular internal cavity size was normal in size. There is no left ventricular hypertrophy. Left ventricular diastolic parameters are consistent with Grade I diastolic dysfunction (impaired relaxation). Right Ventricle: The right ventricular size is normal. No increase in right ventricular wall thickness. Right ventricular systolic function is low normal. Left Atrium: Left atrial size was mild to moderately dilated. Right Atrium: Right atrial  size was normal in size. Pericardium: There is no evidence of pericardial effusion. Mitral Valve: The mitral valve is normal in structure. No  evidence of mitral valve regurgitation. No evidence of mitral valve stenosis. MV peak gradient, 4.2 mmHg. The mean mitral valve gradient is 2.0 mmHg. Tricuspid Valve: The tricuspid valve is normal in structure. Tricuspid valve regurgitation is not demonstrated. No evidence of tricuspid stenosis. Aortic Valve: The aortic valve is normal in structure. Aortic valve regurgitation is not visualized. No aortic stenosis is present. Aortic valve mean gradient measures 3.0 mmHg. Aortic valve peak gradient measures 6.6 mmHg. Aortic valve area, by VTI measures 2.05 cm. Pulmonic Valve: The pulmonic valve was normal in structure. Pulmonic valve regurgitation is not visualized. No evidence of pulmonic stenosis. Aorta: The aortic root is normal in size and structure. Venous: The inferior vena cava is normal in size with greater than 50% respiratory variability, suggesting right atrial pressure of 3 mmHg. IAS/Shunts: No atrial level shunt detected by color flow Doppler.  LEFT VENTRICLE PLAX 2D LVIDd:         4.40 cm     Diastology LVIDs:         3.50 cm     LV e' medial:    7.40 cm/s LV PW:         1.00 cm     LV E/e' medial:  12.2 LV IVS:        1.30 cm     LV e' lateral:   10.90 cm/s LVOT diam:     2.00 cm     LV E/e' lateral: 8.3 LV SV:         56 LV SV Index:   32 LVOT Area:     3.14 cm  LV Volumes (MOD) LV vol d, MOD A2C: 62.1 ml LV vol d, MOD A4C: 75.9 ml LV vol s, MOD A2C: 25.8 ml LV vol s, MOD A4C: 27.9 ml LV SV MOD A2C:     36.3 ml LV SV MOD A4C:     75.9 ml LV SV MOD BP:      43.3 ml RIGHT VENTRICLE RV Basal diam:  3.30 cm RV S prime:     10.20 cm/s TAPSE (M-mode): 1.9 cm LEFT ATRIUM             Index        RIGHT ATRIUM           Index LA diam:        3.90 cm 2.24 cm/m   RA Area:     13.90 cm LA Vol (A2C):   72.3 ml 41.47 ml/m  RA Volume:   33.70 ml  19.33 ml/m LA Vol (A4C):   87.2 ml 50.01 ml/m LA Biplane Vol: 80.0 ml 45.88 ml/m  AORTIC VALVE AV Area (Vmax):    2.21 cm AV Area (Vmean):   2.05 cm AV Area (VTI):      2.05 cm AV Vmax:           128.00 cm/s AV Vmean:          84.100 cm/s AV VTI:            0.274 m AV Peak Grad:      6.6 mmHg AV Mean Grad:      3.0 mmHg LVOT Vmax:         90.10 cm/s LVOT Vmean:        55.000 cm/s LVOT VTI:  0.179 m LVOT/AV VTI ratio: 0.65  AORTA Ao Root diam: 3.20 cm MITRAL VALVE MV Area (PHT): 3.27 cm    SHUNTS MV Area VTI:   1.84 cm    Systemic VTI:  0.18 m MV Peak grad:  4.2 mmHg    Systemic Diam: 2.00 cm MV Mean grad:  2.0 mmHg MV Vmax:       1.02 m/s MV Vmean:      63.1 cm/s MV Decel Time: 232 msec MV E velocity: 90.10 cm/s MV A velocity: 75.50 cm/s MV E/A ratio:  1.19 Clearnce Hasten Electronically signed by Clearnce Hasten Signature Date/Time: 06/02/2023/10:17:29 AM    Final    DG Chest Port 1 View Result Date: 06/01/2023 CLINICAL DATA:  Shortness of breath EXAM: PORTABLE CHEST 1 VIEW COMPARISON:  05/30/2023 FINDINGS: Linear and nodular opacity in the left lung with fiducial marker, stable and known. Volume loss and indistinct streaky density at the right lung base. No edema, effusion, or pneumothorax. Normal heart size. Prior median sternotomy. IMPRESSION: Mild infiltrate at the right lung base where there was airway impaction/opacification by most recent CT. Electronically Signed   By: Tiburcio Pea M.D.   On: 06/01/2023 07:11   DG Pelvis 1-2 Views Result Date: 05/31/2023 CLINICAL DATA:  Pain after fall. EXAM: PELVIS - 1-2 VIEW COMPARISON:  07/28/2021 FINDINGS: The cortical margins of the bony pelvis are intact. No fracture. Pubic symphysis and sacroiliac joints are congruent. Both femoral heads are well-seated in the respective acetabula. Calcified uterine fibroids. IMPRESSION: No pelvic fracture. Electronically Signed   By: Narda Rutherford M.D.   On: 05/31/2023 20:21   CT Angio Chest Pulmonary Embolism (PE) W or WO Contrast Result Date: 05/31/2023 CLINICAL DATA:  Chest pain and difficulty breathing EXAM: CT ANGIOGRAPHY CHEST WITH CONTRAST TECHNIQUE: Multidetector CT  imaging of the chest was performed using the standard protocol during bolus administration of intravenous contrast. Multiplanar CT image reconstructions and MIPs were obtained to evaluate the vascular anatomy. RADIATION DOSE REDUCTION: This exam was performed according to the departmental dose-optimization program which includes automated exposure control, adjustment of the mA and/or kV according to patient size and/or use of iterative reconstruction technique. CONTRAST:  75mL OMNIPAQUE IOHEXOL 350 MG/ML SOLN COMPARISON:  Chest x-ray from the previous day, CT from 12/30/2022 FINDINGS: Cardiovascular: Atherosclerotic calcifications of the thoracic aorta are noted. No aneurysmal dilatation is noted. The degree of opacification is limited precluding evaluation for dissection. Heart is at the upper limits of normal in size. The pulmonary artery shows a normal branching pattern bilaterally. No intraluminal filling defect is identified to suggest pulmonary embolism. Mediastinum/Nodes: Thoracic inlet is within normal limits. No hilar or mediastinal adenopathy is noted. The esophagus as visualized is within normal limits. Lungs/Pleura: Lungs demonstrate some emphysematous changes. Persistent spiculated nodule is noted in the left upper lobe with some tenting of the adjacent fissure stable in appearance. Fiducial markers are noted. No new spiculated nodular density is seen. Patchy ground-glass opacities are noted in the right upper lobe likely postinflammatory in nature. These were not seen on most recent exam. Atelectatic changes are noted in the right lower lobe related to significant mucous plugging of the right lower lobe. A nodular density is noted in the right lower lobe with air bronchograms within. This measures 15 mm in greatest dimension and is new from the prior exam best visualized on image number 100 of series 6. Portion of these changes may be inflammatory although the possibility of underlying neoplasm  deserves consideration well.  No sizable effusion is seen. Upper Abdomen: Visualized upper abdomen is unremarkable. Musculoskeletal: Degenerative changes of the thoracic spine are seen. No acute rib abnormality is noted. Chronic compression deformities in the lower thoracic spine are noted. Review of the MIP images confirms the above findings. IMPRESSION: Stable left upper lobe spiculated nodule consistent with the given clinical history. Nodular density in the right lower lobe measuring up to 15 mm. This is new from the prior exam but given the adjacent mucous plugging and inflammatory change this may be inflammatory in nature. Short-term follow-up is recommended. Neoplasm is not excluded on the basis of this exam. Small patchy ground-glass opacities in the right upper lobe likely postinflammatory in nature. No evidence of pulmonary embolism. Aortic Atherosclerosis (ICD10-I70.0) and Emphysema (ICD10-J43.9). Electronically Signed   By: Alcide Clever M.D.   On: 05/31/2023 02:26   DG Chest 2 View Result Date: 05/30/2023 CLINICAL DATA:  Chest pain EXAM: CHEST - 2 VIEW COMPARISON:  CT from 12/30/2022 FINDINGS: Cardiac shadow is stable. Fiducial marker is noted in the left upper adjacent to a linear area of density corresponding to prior nodule. The nodule is less well visualized on this exam. Multiple rib fractures with healing are noted on the right similar to that seen on prior CT. No focal infiltrate or sizable effusion is seen. Multilevel compression deformities are noted in the thoracic spine stable from previous exam. IMPRESSION: No acute abnormality is noted. Chronic changes as described above. Electronically Signed   By: Alcide Clever M.D.   On: 05/30/2023 20:52      Subjective: No significant events overnight, she denies any complaints today, she ambulated in the hallway yesterday with no hypoxia  Discharge Exam: Vitals:   06/06/23 0825 06/06/23 0830  BP:  111/63  Pulse: 77 80  Resp: 16 17  Temp:   (!) 97.4 F (36.3 C)  SpO2: 97% 96%   Vitals:   06/06/23 0815 06/06/23 0820 06/06/23 0825 06/06/23 0830  BP:    111/63  Pulse: 75 74 77 80  Resp: 15 14 16 17   Temp:    (!) 97.4 F (36.3 C)  TempSrc:    Oral  SpO2: 100% 100% 97% 96%  Weight:      Height:        General: Pt is alert, awake, not in acute distress Cardiovascular: Irregular but controlled, S1/S2 +, no rubs, no gallops Respiratory: CTA bilaterally, no wheezing, no rhonchi Abdominal: Soft, NT, ND, bowel sounds + Extremities: no edema, no cyanosis    The results of significant diagnostics from this hospitalization (including imaging, microbiology, ancillary and laboratory) are listed below for reference.     Microbiology: Recent Results (from the past 240 hours)  Culture, blood (Routine X 2) w Reflex to ID Panel     Status: None   Collection Time: 05/31/23 12:14 AM   Specimen: BLOOD  Result Value Ref Range Status   Specimen Description BLOOD SITE NOT SPECIFIED  Final   Special Requests   Final    BOTTLES DRAWN AEROBIC AND ANAEROBIC Blood Culture adequate volume   Culture   Final    NO GROWTH 5 DAYS Performed at [facility], [address] [place], [place] [address]    Report Status 06/05/2023 FINAL  Final  Culture, blood (Routine X 2) w Reflex to ID Panel     Status: None   Collection Time: 05/31/23 12:14 AM   Specimen: BLOOD  Result Value Ref Range Status   Specimen Description  BLOOD SITE NOT SPECIFIED  Final   Special Requests   Final    BOTTLES DRAWN AEROBIC AND ANAEROBIC Blood Culture results may not be optimal due to an inadequate volume of blood received in culture bottles   Culture   Final    NO GROWTH 5 DAYS Performed at [facility], [address], [place], [place] [address]    Report Status 06/05/2023 FINAL  Final  Resp panel by RT-PCR (RSV, Flu A&B, Covid)     Status: None   Collection Time: 05/31/23 12:14 AM   Specimen: Nasal Swab  Result Value Ref Range Status   SARS  Coronavirus 2 by RT PCR NEGATIVE NEGATIVE Final   Influenza A by PCR NEGATIVE NEGATIVE Final   Influenza B by PCR NEGATIVE NEGATIVE Final    Comment: (NOTE) The Xpert Xpress SARS-CoV-2/FLU/RSV plus assay is intended as an aid in the diagnosis of influenza from Nasopharyngeal swab specimens and should not be used as a sole basis for treatment. Nasal washings and aspirates are unacceptable for Xpert Xpress SARS-CoV-2/FLU/RSV testing.  Fact Sheet for Patients: BloggerCourse.com  Fact Sheet for Healthcare Providers: SeriousBroker.it  This test is not yet approved or cleared by the [place] FDA and has been authorized for detection and/or diagnosis of SARS-CoV-2 by FDA under an Emergency Use Authorization (EUA). This EUA will remain in effect (meaning this test can be used) for the duration of the COVID-19 declaration under Section 564(b)(1) of the Act, 21 U.S.C. section 360bbb-3(b)(1), unless the authorization is terminated or revoked.     Resp Syncytial Virus by PCR NEGATIVE NEGATIVE Final    Comment: (NOTE) Fact Sheet for Patients: BloggerCourse.com  Fact Sheet for Healthcare Providers: SeriousBroker.it  This test is not yet approved or cleared by the [place] FDA and has been authorized for detection and/or diagnosis of SARS-CoV-2 by FDA under an Emergency Use Authorization (EUA). This EUA will remain in effect (meaning this test can be used) for the duration of the COVID-19 declaration under Section 564(b)(1) of the Act, 21 U.S.C. section 360bbb-3(b)(1), unless the authorization is terminated or revoked.  Performed at [facility], [address], [place], [place] [address]   Urine Culture     Status: Abnormal   Collection Time: 05/31/23 12:51 AM   Specimen: Urine, Random  Result Value Ref Range Status   Specimen Description URINE, RANDOM  Final   Special  Requests   Final    NONE Reflexed from [phone] [id number] Performed at [facility], [address] [place], [place] [address]    Culture MULTIPLE SPECIES PRESENT, SUGGEST RECOLLECTION (A)  Final   Report Status 06/01/2023 FINAL  Final     Labs: BNP (last 3 results) Recent Labs    06/03/23 0459 06/04/23 0526 06/05/23 0602  BNP 438.9* 454.4* 443.6*   Basic Metabolic Panel: Recent Labs  Lab 06/01/23 9562 06/02/23 0452 06/03/23 0459 06/04/23 0526 06/05/23 0602 06/05/23 2021 06/06/23 0551  NA 139 137 136 132* 131* 134* 133*  K 3.7 3.3* 4.9 4.2 3.5 3.6 3.3*  CL 95* 100 101 95* 95* 90* 95*  CO2 28 28 24 26 28 29 30   GLUCOSE 161* 128* 129* 182* 184* 462* 135*  BUN 18 19 24* 27* 39* 46* 48*  CREATININE 1.16* 0.99 0.99 0.95 1.20* 1.43* 1.09*  CALCIUM 8.8* 8.6* 8.4* 9.3 8.8* 9.4 8.9  MG 1.6* 2.0 1.7 1.9 1.8  --   --    Liver Function Tests: No results  for input(s): "AST", "ALT", "ALKPHOS", "BILITOT", "PROT", "ALBUMIN" in the last 168 hours. No results for input(s): "LIPASE", "AMYLASE" in the last 168 hours. No results for input(s): "AMMONIA" in the last 168 hours. CBC: Recent Labs  Lab 06/02/23 0452 06/03/23 0459 06/04/23 0526 06/05/23 0602 06/06/23 0551  WBC 11.4* 11.1* 11.0* 12.0* 13.3*  NEUTROABS 8.9* 8.4* 8.5* 9.1* 9.5*  HGB 11.2* 11.4* 12.1 12.7 12.3  HCT 34.4* 34.6* 36.1 37.3 36.3  MCV 100.0 100.6* 98.4 96.9 95.3  PLT 226 249 270 298 344   Cardiac Enzymes: No results for input(s): "CKTOTAL", "CKMB", "CKMBINDEX", "TROPONINI" in the last 168 hours. BNP: Invalid input(s): "POCBNP" CBG: Recent Labs  Lab 06/05/23 1604 06/05/23 1952 06/05/23 1956 06/05/23 2330 06/06/23 0838  GLUCAP 211* 418* 479* 321* 203*   D-Dimer No results for input(s): "DDIMER" in the last 72 hours. Hgb A1c No results for input(s): "HGBA1C" in the last 72 hours. Lipid Profile No results for input(s): "CHOL", "HDL", "LDLCALC", "TRIG", "CHOLHDL", "LDLDIRECT" in the last 72  hours. Thyroid function studies No results for input(s): "TSH", "T4TOTAL", "T3FREE", "THYROIDAB" in the last 72 hours.  Invalid input(s): "FREET3" Anemia work up No results for input(s): "VITAMINB12", "FOLATE", "FERRITIN", "TIBC", "IRON", "RETICCTPCT" in the last 72 hours. Urinalysis    Component Value Date/Time   COLORURINE YELLOW 05/31/2023 0051   APPEARANCEUR CLEAR 05/31/2023 0051   LABSPEC 1.033 (H) 05/31/2023 0051   PHURINE 6.0 05/31/2023 0051   GLUCOSEU NEGATIVE 05/31/2023 0051   HGBUR SMALL (A) 05/31/2023 0051   BILIRUBINUR NEGATIVE 05/31/2023 0051   KETONESUR NEGATIVE 05/31/2023 0051   PROTEINUR >=300 (A) 05/31/2023 0051   UROBILINOGEN 4.0 (H) 03/31/2013 0454   NITRITE POSITIVE (A) 05/31/2023 0051   LEUKOCYTESUR NEGATIVE 05/31/2023 0051   Sepsis Labs Recent Labs  Lab 06/03/23 0459 06/04/23 0526 06/05/23 0602 06/06/23 0551  WBC 11.1* 11.0* 12.0* 13.3*   Microbiology Recent Results (from the past 240 hours)  Culture, blood (Routine X 2) w Reflex to ID Panel     Status: None   Collection Time: 05/31/23 12:14 AM   Specimen: BLOOD  Result Value Ref Range Status   Specimen Description BLOOD SITE NOT SPECIFIED  Final   Special Requests   Final    BOTTLES DRAWN AEROBIC AND ANAEROBIC Blood Culture adequate volume   Culture   Final    NO GROWTH 5 DAYS Performed at [facility], [address], [place], [place] [address]    Report Status 06/05/2023 FINAL  Final  Culture, blood (Routine X 2) w Reflex to ID Panel     Status: None   Collection Time: 05/31/23 12:14 AM   Specimen: BLOOD  Result Value Ref Range Status   Specimen Description BLOOD SITE NOT SPECIFIED  Final   Special Requests   Final    BOTTLES DRAWN AEROBIC AND ANAEROBIC Blood Culture results may not be optimal due to an inadequate volume of blood received in culture bottles   Culture   Final    NO GROWTH 5 DAYS Performed at [facility], [address], [place], [place] [address]    Report  Status 06/05/2023 FINAL  Final  Resp panel by RT-PCR (RSV, Flu A&B, Covid)     Status: None   Collection Time: 05/31/23 12:14 AM   Specimen: Nasal Swab  Result Value Ref Range Status   SARS Coronavirus 2 by RT PCR NEGATIVE NEGATIVE Final   Influenza A by PCR NEGATIVE NEGATIVE Final   Influenza B by  PCR NEGATIVE NEGATIVE Final    Comment: (NOTE) The Xpert Xpress SARS-CoV-2/FLU/RSV plus assay is intended as an aid in the diagnosis of influenza from Nasopharyngeal swab specimens and should not be used as a sole basis for treatment. Nasal washings and aspirates are unacceptable for Xpert Xpress SARS-CoV-2/FLU/RSV testing.  Fact Sheet for Patients: BloggerCourse.com  Fact Sheet for Healthcare Providers: SeriousBroker.it  This test is not yet approved or cleared by the [place] FDA and has been authorized for detection and/or diagnosis of SARS-CoV-2 by FDA under an Emergency Use Authorization (EUA). This EUA will remain in effect (meaning this test can be used) for the duration of the COVID-19 declaration under Section 564(b)(1) of the Act, 21 U.S.C. section 360bbb-3(b)(1), unless the authorization is terminated or revoked.     Resp Syncytial Virus by PCR NEGATIVE NEGATIVE Final    Comment: (NOTE) Fact Sheet for Patients: BloggerCourse.com  Fact Sheet for Healthcare Providers: SeriousBroker.it  This test is not yet approved or cleared by the [place] FDA and has been authorized for detection and/or diagnosis of SARS-CoV-2 by FDA under an Emergency Use Authorization (EUA). This EUA will remain in effect (meaning this test can be used) for the duration of the COVID-19 declaration under Section 564(b)(1) of the Act, 21 U.S.C. section 360bbb-3(b)(1), unless the authorization is terminated or revoked.  Performed at [facility], [address] [place],  [place] [address]   Urine Culture     Status: Abnormal   Collection Time: 05/31/23 12:51 AM   Specimen: Urine, Random  Result Value Ref Range Status   Specimen Description URINE, RANDOM  Final   Special Requests   Final    NONE Reflexed from [id number] Performed at [facility], [address], [place], [place] [address]    Culture MULTIPLE SPECIES PRESENT, SUGGEST RECOLLECTION (A)  Final   Report Status 06/01/2023 FINAL  Final     Time coordinating discharge: Over 30 minutes  SIGNED:   Huey Bienenstock, MD  [facility] 06/06/2023, 10:52 AM Pager   If 7PM-7AM, please contact night-coverage [email or web address] Password [email or web address]

## 2023-06-06 NOTE — Plan of Care (Signed)
  Problem: Education: Goal: Knowledge of General Education information will improve Description: Including pain rating scale, medication(s)/side effects and non-pharmacologic comfort measures Outcome: Adequate for Discharge   Problem: Health Behavior/Discharge Planning: Goal: Ability to manage health-related needs will improve Outcome: Adequate for Discharge   Problem: Clinical Measurements: Goal: Ability to maintain clinical measurements within normal limits will improve Outcome: Adequate for Discharge Goal: Will remain free from infection Outcome: Adequate for Discharge Goal: Diagnostic test results will improve Outcome: Adequate for Discharge Goal: Respiratory complications will improve Outcome: Adequate for Discharge Goal: Cardiovascular complication will be avoided Outcome: Adequate for Discharge   Problem: Activity: Goal: Risk for activity intolerance will decrease Outcome: Adequate for Discharge   Problem: Nutrition: Goal: Adequate nutrition will be maintained Outcome: Adequate for Discharge   Problem: Coping: Goal: Level of anxiety will decrease Outcome: Adequate for Discharge   Problem: Elimination: Goal: Will not experience complications related to bowel motility Outcome: Adequate for Discharge Goal: Will not experience complications related to urinary retention Outcome: Adequate for Discharge   Problem: Pain Managment: Goal: General experience of comfort will improve and/or be controlled Outcome: Adequate for Discharge   Problem: Safety: Goal: Ability to remain free from injury will improve Outcome: Adequate for Discharge   Problem: Skin Integrity: Goal: Risk for impaired skin integrity will decrease Outcome: Adequate for Discharge   Problem: Education: Goal: Ability to describe self-care measures that may prevent or decrease complications (Diabetes Survival Skills Education) will improve Outcome: Adequate for Discharge Goal: Individualized Educational  Video(s) Outcome: Adequate for Discharge   Problem: Coping: Goal: Ability to adjust to condition or change in health will improve Outcome: Adequate for Discharge   Problem: Fluid Volume: Goal: Ability to maintain a balanced intake and output will improve Outcome: Adequate for Discharge   Problem: Health Behavior/Discharge Planning: Goal: Ability to identify and utilize available resources and services will improve Outcome: Adequate for Discharge Goal: Ability to manage health-related needs will improve Outcome: Adequate for Discharge   Problem: Metabolic: Goal: Ability to maintain appropriate glucose levels will improve Outcome: Adequate for Discharge   Problem: Nutritional: Goal: Maintenance of adequate nutrition will improve Outcome: Adequate for Discharge Goal: Progress toward achieving an optimal weight will improve Outcome: Adequate for Discharge   Problem: Skin Integrity: Goal: Risk for impaired skin integrity will decrease Outcome: Adequate for Discharge   Problem: Tissue Perfusion: Goal: Adequacy of tissue perfusion will improve Outcome: Adequate for Discharge   Problem: Acute Rehab PT Goals(only PT should resolve) Goal: Pt Will Go Supine/Side To Sit Outcome: Adequate for Discharge Goal: Patient Will Transfer Sit To/From Stand Outcome: Adequate for Discharge Goal: Pt Will Ambulate Outcome: Adequate for Discharge   Problem: Acute Rehab OT Goals (only OT should resolve) Goal: Pt. Will Perform Upper Body Dressing Outcome: Adequate for Discharge Goal: Pt. Will Perform Lower Body Dressing Outcome: Adequate for Discharge Goal: Pt. Will Transfer To Toilet Outcome: Adequate for Discharge   Problem: Acute Rehab OT Goals (only OT should resolve) Goal: OT Additional ADL Goal #1 Outcome: Adequate for Discharge Goal: OT Additional ADL Goal #2 Outcome: Adequate for Discharge

## 2023-06-06 NOTE — Plan of Care (Signed)
   Problem: Education: Goal: Knowledge of General Education information will improve Description Including pain rating scale, medication(s)/side effects and non-pharmacologic comfort measures Outcome: Progressing   Problem: Health Behavior/Discharge Planning: Goal: Ability to manage health-related needs will improve Outcome: Progressing

## 2023-06-06 NOTE — TOC Transition Note (Addendum)
 Transition of Care [facility]) - Discharge Note   Patient Details  Name: Holly May MRN: [id number] Date of Birth: 04-24-1950  Transition of [facility]) CM/SW Contact:  Lockie Pares, RN Phone Number: 06/06/2023, 9:42 AM   Clinical Narrative:     Patient is set up with a Rolator, she has a nebulizer at home. She did not qualify for oxygen with a walk test.  She states she will drive herself home, her car is here.   Home health is set up informed them of DC Final next level of care: Home w Home Health Services     Patient Goals and CMS Choice            Discharge Placement  Home with Home health                     Discharge Plan and Services Additional resources added to the After Visit Summary for                    DME Agency: [employer] Date DME Agency Contacted: 06/06/23 Time DME Agency Contacted: [phone] Representative spoke with at DME Agency: [facility] Arranged: PT, RN, OT [facility] Agency: [facility] Date HH Agency Contacted: 06/05/23 Time HH Agency Contacted: [phone] Representative spoke with at [facility] Agency: Adele Dan  Social Drivers of Health (SDOH) Interventions SDOH Screenings   Food Insecurity: Food Insecurity Present (05/31/2023)  Housing: Low Risk  (06/02/2023)  Transportation Needs: No Transportation Needs (05/31/2023)  Utilities: Not At Risk (05/31/2023)  Alcohol Screen: Low Risk  (07/30/2022)  Depression (PHQ2-9): Low Risk  (08/21/2022)  Social Connections: Moderately Isolated (05/31/2023)  Tobacco Use: High Risk (06/05/2023)     Readmission Risk Interventions     No data to display

## 2023-06-06 NOTE — Progress Notes (Signed)
 DISCHARGE NOTE HOME Holly May to be discharged Home per MD order. Discussed prescriptions and follow up appointments with the patient. Prescriptions given to patient; medication list explained in detail. Patient verbalized understanding.  Skin clean, dry and intact without evidence of skin break down, no evidence of skin tears noted. IV catheter discontinued intact. Site without signs and symptoms of complications. Dressing and pressure applied. Pt denies pain at the site currently. No complaints noted.  Patient free of lines, drains, and wounds.   An After Visit Summary (AVS) was printed and given to the patient. Patient escorted via wheelchair, and discharged home via private auto.  Velia Meyer, RN

## 2023-06-07 ENCOUNTER — Other Ambulatory Visit: Payer: Self-pay

## 2023-06-07 ENCOUNTER — Emergency Department (HOSPITAL_COMMUNITY)
Admission: EM | Admit: 2023-06-07 | Discharge: 2023-06-07 | Disposition: A | Discharge status: Home / Self Care | Attending: Emergency Medicine | Admitting: Emergency Medicine

## 2023-06-07 ENCOUNTER — Encounter (HOSPITAL_COMMUNITY): Payer: Self-pay | Admitting: *Deleted

## 2023-06-07 ENCOUNTER — Emergency Department (HOSPITAL_COMMUNITY)

## 2023-06-07 DIAGNOSIS — J449 Chronic obstructive pulmonary disease, unspecified: Secondary | ICD-10-CM | POA: Insufficient documentation

## 2023-06-07 DIAGNOSIS — D72829 Elevated white blood cell count, unspecified: Secondary | ICD-10-CM | POA: Insufficient documentation

## 2023-06-07 DIAGNOSIS — Z7951 Long term (current) use of inhaled steroids: Secondary | ICD-10-CM | POA: Diagnosis not present

## 2023-06-07 DIAGNOSIS — E119 Type 2 diabetes mellitus without complications: Secondary | ICD-10-CM | POA: Insufficient documentation

## 2023-06-07 DIAGNOSIS — R7989 Other specified abnormal findings of blood chemistry: Secondary | ICD-10-CM | POA: Diagnosis not present

## 2023-06-07 DIAGNOSIS — Z7901 Long term (current) use of anticoagulants: Secondary | ICD-10-CM | POA: Insufficient documentation

## 2023-06-07 DIAGNOSIS — R0602 Shortness of breath: Secondary | ICD-10-CM | POA: Diagnosis present

## 2023-06-07 DIAGNOSIS — J9601 Acute respiratory failure with hypoxia: Secondary | ICD-10-CM | POA: Diagnosis not present

## 2023-06-07 DIAGNOSIS — Z85118 Personal history of other malignant neoplasm of bronchus and lung: Secondary | ICD-10-CM | POA: Diagnosis not present

## 2023-06-07 DIAGNOSIS — Z5941 Food insecurity: Secondary | ICD-10-CM | POA: Diagnosis not present

## 2023-06-07 DIAGNOSIS — Z720 Tobacco use: Secondary | ICD-10-CM | POA: Diagnosis not present

## 2023-06-07 LAB — CBC WITH DIFFERENTIAL/PLATELET
Abs Immature Granulocytes: 0.29 10*3/uL — ABNORMAL HIGH (ref 0.00–0.07)
Basophils Absolute: 0.1 10*3/uL (ref 0.0–0.1)
Basophils Relative: 1 %
Eosinophils Absolute: 0.1 10*3/uL (ref 0.0–0.5)
Eosinophils Relative: 0 %
HCT: 38.2 % (ref 36.0–46.0)
Hemoglobin: 12.2 g/dL (ref 12.0–15.0)
Immature Granulocytes: 2 %
Lymphocytes Relative: 20 %
Lymphs Abs: 3 10*3/uL (ref 0.7–4.0)
MCH: 32.6 pg (ref 26.0–34.0)
MCHC: 31.9 g/dL (ref 30.0–36.0)
MCV: 102.1 fL — ABNORMAL HIGH (ref 80.0–100.0)
Monocytes Absolute: 1.8 10*3/uL — ABNORMAL HIGH (ref 0.1–1.0)
Monocytes Relative: 12 %
Neutro Abs: 10 10*3/uL — ABNORMAL HIGH (ref 1.7–7.7)
Neutrophils Relative %: 65 %
Platelets: 346 10*3/uL (ref 150–400)
RBC: 3.74 MIL/uL — ABNORMAL LOW (ref 3.87–5.11)
RDW: 14 % (ref 11.5–15.5)
WBC: 15.3 10*3/uL — ABNORMAL HIGH (ref 4.0–10.5)
nRBC: 0 % (ref 0.0–0.2)

## 2023-06-07 LAB — RESP PANEL BY RT-PCR (RSV, FLU A&B, COVID)  RVPGX2
Influenza A by PCR: NEGATIVE
Influenza B by PCR: NEGATIVE
Resp Syncytial Virus by PCR: NEGATIVE
SARS Coronavirus 2 by RT PCR: NEGATIVE

## 2023-06-07 LAB — BASIC METABOLIC PANEL
Anion gap: 12 (ref 5–15)
BUN: 46 mg/dL — ABNORMAL HIGH (ref 8–23)
CO2: 31 mmol/L (ref 22–32)
Calcium: 9.4 mg/dL (ref 8.9–10.3)
Chloride: 94 mmol/L — ABNORMAL LOW (ref 98–111)
Creatinine, Ser: 1.09 mg/dL — ABNORMAL HIGH (ref 0.44–1.00)
GFR, Estimated: 55 mL/min — ABNORMAL LOW (ref >60)
Glucose, Bld: 170 mg/dL — ABNORMAL HIGH (ref 70–99)
Potassium: 3.5 mmol/L (ref 3.5–5.1)
Sodium: 137 mmol/L (ref 135–145)

## 2023-06-07 LAB — BRAIN NATRIURETIC PEPTIDE: B Natriuretic Peptide: 113 pg/mL — ABNORMAL HIGH (ref 0.0–100.0)

## 2023-06-07 NOTE — ED Notes (Addendum)
 Pt is currently on RA oxygen is above 92%. Pt was sat up and told cough and deep breathe.

## 2023-06-07 NOTE — ED Notes (Signed)
 EDP was notified of pt's HR

## 2023-06-07 NOTE — ED Notes (Signed)
 Pt received O2 tank.

## 2023-06-07 NOTE — ED Notes (Signed)
 Pt's sister Lottie (emergency contact) called for an update.

## 2023-06-07 NOTE — ED Triage Notes (Signed)
 Pt BIB [facility] for c/o sob; pt was just discharged yesterday from [facility] for sepsis  Pt was 91% with ems upon arrival but was given duoneb and sats increased to 99%

## 2023-06-07 NOTE — ED Notes (Signed)
 Pt's sister Barth Kirks stated she is coming to get pt.

## 2023-06-07 NOTE — ED Notes (Signed)
 SATURATION QUALIFICATIONS: (This note is used to comply with regulatory documentation for home oxygen)  Patient Saturations on Room Air at Rest = 84%  Patient Saturations on Room Air while Ambulating = 84%  Patient Saturations on 2 Liters of oxygen while Ambulating = 95%  Please briefly explain why patient needs home oxygen:

## 2023-06-07 NOTE — ED Notes (Addendum)
 Pt was taken off of oxygen at 1414 to ambulate on RA with pulse ox. Oxygen dropped to 84% while ambulating. Pt was grunting while SOB.

## 2023-06-07 NOTE — ED Notes (Addendum)
 CSW was informed that this pt needed oxygen to DC. CSW spoke with Ashly with [employer] who will be providing patient oxygen . Ashly confirmed that I could provide pt with tank and provide number of delivery driver for pt to call before leaving hospital so they can do home set up. TOC signing off.   Pt was given a tank , CSW and pt called DME driver Shawn and Shawn told the pt to call him once he was home so he could deliver the home set up oxygen.

## 2023-06-07 NOTE — Discharge Instructions (Signed)
 You were seen in the emergency department for increased shortness of breath.  Your oxygen level was low and we are getting you set up with home oxygen.  Please continue to use this.  Finish your antibiotics and steroids as you were directed in your discharge summary.  Follow-up with your primary care doctor.  Return to the emergency department if any worsening or concerning symptoms

## 2023-06-07 NOTE — ED Notes (Signed)
 Pt's oxygen decreased below 90%, pt was put on 2L [place].

## 2023-06-07 NOTE — ED Provider Notes (Signed)
 [place] EMERGENCY DEPARTMENT AT [facility] Provider Note   CSN: [id number] Arrival date & time: 06/07/23  1058     History  No chief complaint on file.   Holly May is a 73 y.o. female.  He has a history of COPD, lung cancer, diabetes, A-fib on anticoagulation.  She was discharged yesterday after being admitted for sepsis secondary to UTI and community-acquired pneumonia.  She was hypoxic during admission and treated with steroids and breathing treatments.  She was also diuresed for some heart failure.  She said she felt well on discharge but started feeling short of breath again with any type of exertion today.  She is an active smoker.  She thinks she should have been discharged with oxygen.  She said she does not need it all the times but sometimes she does and would like to have it available.  The history is provided by the patient and the EMS personnel.  Shortness of Breath Severity:  Moderate Onset quality:  Gradual Duration:  2 hours Timing:  Constant Progression:  Improving Chronicity:  Recurrent Context: activity   Relieved by:  Oxygen Worsened by:  Activity Ineffective treatments:  Rest Associated symptoms: no abdominal pain, no chest pain, no cough, no fever, no hemoptysis, no sputum production, no swollen glands and no wheezing   Risk factors: tobacco use        Home Medications Prior to Admission medications   Medication Sig Start Date End Date Taking? Authorizing Provider  albuterol (PROVENTIL HFA;VENTOLIN HFA) 108 (90 Base) MCG/ACT inhaler Inhale 2 puffs into the lungs every 4 (four) hours as needed for wheezing or shortness of breath (cough, shortness of breath or wheezing.). Patient taking differently: Inhale 2 puffs into the lungs every 4 (four) hours as needed for wheezing or shortness of breath (cough). 02/18/18   Sherryll Burger, Pratik D, DO  alendronate (FOSAMAX) 70 MG tablet Take 70 mg by mouth once a week. Wednesday 06/17/22   [provider]  apixaban (ELIQUIS) 5 MG TABS tablet Take 1 tablet (5 mg total) by mouth 2 (two) times daily. 05/01/23   Mallipeddi, Vishnu P, MD  bisoprolol (ZEBETA) 5 MG tablet Take 1 tablet (5 mg total) by mouth daily. 05/01/23   Mallipeddi, Vishnu P, MD  citalopram (CELEXA) 20 MG tablet Take 1 tablet (20 mg total) by mouth daily. 06/14/16   Johnson, Clanford L, MD  diltiazem (CARTIA XT) 240 MG 24 hr capsule Take 1 capsule (240 mg total) by mouth daily. 06/06/23 06/05/24  Elgergawy, Leana Roe, MD  fluticasone furoate-vilanterol (BREO ELLIPTA) 100-25 MCG/ACT AEPB Inhale 1 puff into the lungs daily. 05/27/23   [provider]  gabapentin (NEURONTIN) 300 MG capsule Take 300 mg by mouth daily.    [provider]  ipratropium-albuterol (DUONEB) 0.5-2.5 (3) MG/3ML SOLN Take 3 mLs by nebulization every 6 (six) hours as needed (wheezing, shortness of breath). 06/11/22   [provider]  lovastatin (MEVACOR) 40 MG tablet Take 40 mg by mouth daily.    [provider]  mirtazapine (REMERON) 15 MG tablet Take 15 mg by mouth at bedtime.    [provider]  olmesartan (BENICAR) 40 MG tablet Take 1 tablet (40 mg total) by mouth daily. 02/11/23 02/11/24  Nyoka Cowden, MD  oxybutynin (DITROPAN) 5 MG tablet Take 5 mg by mouth 2 (two) times daily.    [provider]  pantoprazole (PROTONIX) 40 MG tablet Take 1 tablet (40 mg total) by mouth daily. 06/07/23  Elgergawy, Leana Roe, MD  predniSONE (STERAPRED UNI-PAK 21 TAB) 10 MG (21) TBPK tablet Use per Package instruction 06/06/23   Elgergawy, Leana Roe, MD      Allergies    Patient has no known allergies.    Review of Systems   Review of Systems  Constitutional:  Negative for fever.  Respiratory:  Positive for shortness of breath. Negative for cough, hemoptysis, sputum production and wheezing.   Cardiovascular:  Negative for chest pain.  Gastrointestinal:  Negative for abdominal pain.    Physical Exam Updated Vital Signs Pulse  87   Temp (!) 97.5 F (36.4 C) (Oral)   Resp 17   SpO2 92%  Physical Exam Vitals and nursing note reviewed.  Constitutional:      General: She is not in acute distress.    Appearance: Normal appearance. She is well-developed.  HENT:     Head: Normocephalic and atraumatic.  Eyes:     Conjunctiva/sclera: Conjunctivae normal.  Cardiovascular:     Rate and Rhythm: Normal rate and regular rhythm.     Heart sounds: No murmur heard. Pulmonary:     Effort: Pulmonary effort is normal. No respiratory distress.     Breath sounds: Normal breath sounds.  Abdominal:     Palpations: Abdomen is soft.     Tenderness: There is no abdominal tenderness. There is no guarding or rebound.  Musculoskeletal:        General: No swelling.     Cervical back: Neck supple.  Skin:    General: Skin is warm and dry.     Capillary Refill: Capillary refill takes less than 2 seconds.  Neurological:     General: No focal deficit present.     Mental Status: She is alert.     ED Results / Procedures / Treatments   Labs (all labs ordered are listed, but only abnormal results are displayed) Labs Reviewed  BASIC METABOLIC PANEL - Abnormal; Notable for the following components:      Result Value   Chloride 94 (*)    Glucose, Bld 170 (*)    BUN 46 (*)    Creatinine, Ser 1.09 (*)    GFR, Estimated 55 (*)    All other components within normal limits  CBC WITH DIFFERENTIAL/PLATELET - Abnormal; Notable for the following components:   WBC 15.3 (*)    RBC 3.74 (*)    MCV 102.1 (*)    Neutro Abs 10.0 (*)    Monocytes Absolute 1.8 (*)    Abs Immature Granulocytes 0.29 (*)    All other components within normal limits  BRAIN NATRIURETIC PEPTIDE - Abnormal; Notable for the following components:   B Natriuretic Peptide 113.0 (*)    All other components within normal limits  RESP PANEL BY RT-PCR (RSV, FLU A&B, COVID)  [id number]    EKG EKG Interpretation Date/Time:  Saturday June 07 2023 11:16:12 EST Ventricular  Rate:  94 PR Interval:  145 QRS Duration:  87 QT Interval:  383 QTC Calculation: 479 R Axis:   33  Text Interpretation: Sinus rhythm Atrial premature complexes Abnormal R-wave progression, early transition Borderline repolarization abnormality Confirmed by Meridee Score [phone]) on 06/07/2023 11:17:32 AM  Radiology DG Chest Port 1 View Result Date: 06/07/2023 CLINICAL DATA:  Shortness of breath. EXAM: PORTABLE CHEST 1 VIEW COMPARISON:  06/03/2023 FINDINGS: Lungs are hyperexpanded. Cardiopericardial silhouette is at upper limits of normal for size. Interstitial markings are diffusely coarsened with chronic features. Patchy opacity in the left mid lung  previously is deep to a cardiac lead on the current study. Improved aeration at the right base. Bones are diffusely demineralized. Telemetry leads overlie the chest. IMPRESSION: 1. Left upper lobe pulmonary nodule seen previously is obscured by cardiac lead on the current study. 2. Improved aeration at the right lung base. Electronically Signed   By: Kennith Center M.D.   On: 06/07/2023 12:17    Procedures Procedures    Medications Ordered in ED Medications - No data to display  ED Course/ Medical Decision Making/ A&P Clinical Course as of 06/07/23 1701  Sat Jun 07, 2023  1409 Patient desatted to 84% with ambulation trial off of oxygen. [MB]  1417 Discussed with Dr. Sherryll Burger [facility] hospitalist.  He was going to consult social work to see if we can get her oxygen at home. [MB]  1518 Social Work was able to arrange for home oxygen. [MB]    Clinical Course User Index [MB] Terrilee Files, MD                                 Medical Decision Making Amount and/or Complexity of Data Reviewed Labs: ordered. Radiology: ordered.   This patient complains of shortness of breath; this involves an extensive number of treatment Options and is a complaint that carries with it a high risk of complications and morbidity. The differential includes  hypoxia, COPD, pneumonia, CHF, respiratory distress  I ordered, reviewed and interpreted labs, which included CBC with elevated white count although recently placed on antibiotics and steroids, hemoglobin stable, chemistry with elevated BUN and creatinine, BNP mildly elevated, COVID and flu negative I ordered medication oxygen and reviewed PMP when indicated. I ordered imaging studies which included chest x-ray and I independently    visualized and interpreted imaging which showed nodule unchanged Previous records obtained and reviewed in epic including recent discharge summary I consulted [facility] hospitalist Dr. Sherryll Burger and case management and discussed lab and imaging findings and discussed disposition.  Cardiac monitoring reviewed, sinus tachycardia Social determinants considered, tobacco use, food insecurity Critical Interventions: None  After the interventions stated above, I reevaluated the patient and found patient to be resting comfortably after being placed on oxygen Admission and further testing considered, hospitalist does not feel medical admission necessary.  They are recommending sending patient home with oxygen.  Social work working on getting patient set up with home oxygen.         Final Clinical Impression(s) / ED Diagnoses Final diagnoses:  Acute respiratory failure with hypoxia [facility])    Rx / DC Orders ED Discharge Orders     None         Terrilee Files, MD 06/07/23 1704

## 2023-07-07 ENCOUNTER — Inpatient Hospital Stay: Payer: Self-pay | Attending: Hematology | Admitting: Oncology

## 2023-07-07 ENCOUNTER — Ambulatory Visit (HOSPITAL_COMMUNITY): Payer: Medicaid Other

## 2023-07-07 ENCOUNTER — Other Ambulatory Visit: Payer: Medicare HMO

## 2023-07-07 ENCOUNTER — Other Ambulatory Visit (HOSPITAL_COMMUNITY): Payer: Medicare HMO

## 2023-07-07 DIAGNOSIS — C3431 Malignant neoplasm of lower lobe, right bronchus or lung: Secondary | ICD-10-CM | POA: Insufficient documentation

## 2023-07-07 DIAGNOSIS — R911 Solitary pulmonary nodule: Secondary | ICD-10-CM

## 2023-07-07 DIAGNOSIS — C3412 Malignant neoplasm of upper lobe, left bronchus or lung: Secondary | ICD-10-CM | POA: Diagnosis present

## 2023-07-07 LAB — COMPREHENSIVE METABOLIC PANEL WITH GFR
ALT: 12 U/L (ref 0–44)
AST: 20 U/L (ref 15–41)
Albumin: 3.5 g/dL (ref 3.5–5.0)
Alkaline Phosphatase: 117 U/L (ref 38–126)
Anion gap: 14 (ref 5–15)
BUN: 13 mg/dL (ref 8–23)
CO2: 30 mmol/L (ref 22–32)
Calcium: 9.5 mg/dL (ref 8.9–10.3)
Chloride: 94 mmol/L — ABNORMAL LOW (ref 98–111)
Creatinine, Ser: 1.1 mg/dL — ABNORMAL HIGH (ref 0.44–1.00)
GFR, Estimated: 54 mL/min — ABNORMAL LOW (ref >60)
Glucose, Bld: 198 mg/dL — ABNORMAL HIGH (ref 70–99)
Potassium: 2.7 mmol/L — CL (ref 3.5–5.1)
Sodium: 138 mmol/L (ref 135–145)
Total Bilirubin: 0.7 mg/dL (ref 0.0–1.2)
Total Protein: 7.3 g/dL (ref 6.5–8.1)

## 2023-07-07 LAB — CBC WITH DIFFERENTIAL/PLATELET
Abs Immature Granulocytes: 0.08 10*3/uL — ABNORMAL HIGH (ref 0.00–0.07)
Basophils Absolute: 0.1 10*3/uL (ref 0.0–0.1)
Basophils Relative: 1 %
Eosinophils Absolute: 0.2 10*3/uL (ref 0.0–0.5)
Eosinophils Relative: 2 %
HCT: 42 % (ref 36.0–46.0)
Hemoglobin: 13.6 g/dL (ref 12.0–15.0)
Immature Granulocytes: 1 %
Lymphocytes Relative: 38 %
Lymphs Abs: 4.1 10*3/uL — ABNORMAL HIGH (ref 0.7–4.0)
MCH: 32 pg (ref 26.0–34.0)
MCHC: 32.4 g/dL (ref 30.0–36.0)
MCV: 98.8 fL (ref 80.0–100.0)
Monocytes Absolute: 1.4 10*3/uL — ABNORMAL HIGH (ref 0.1–1.0)
Monocytes Relative: 13 %
Neutro Abs: 5 10*3/uL (ref 1.7–7.7)
Neutrophils Relative %: 45 %
Platelets: 339 10*3/uL (ref 150–400)
RBC: 4.25 MIL/uL (ref 3.87–5.11)
RDW: 16.1 % — ABNORMAL HIGH (ref 11.5–15.5)
WBC: 10.8 10*3/uL — ABNORMAL HIGH (ref 4.0–10.5)
nRBC: 0 % (ref 0.0–0.2)

## 2023-07-07 NOTE — Progress Notes (Signed)
 CRITICAL VALUE ALERT Critical value received:  K+2.7 Date of notification:  07-07-23 Time of notification: 1544 Critical value read back:  Yes.   Nurse who received alert:  C. Skyann Ganim RN MD notified time and response:  Dr. Anders Simmonds, [phone]

## 2023-07-08 ENCOUNTER — Other Ambulatory Visit: Payer: Self-pay | Admitting: *Deleted

## 2023-07-08 MED ORDER — POTASSIUM CHLORIDE CRYS ER 20 MEQ PO TBCR: 20.0000 meq | EXTENDED_RELEASE_TABLET | Freq: Every day | ORAL | 3 refills | Status: DC

## 2023-07-08 NOTE — Progress Notes (Signed)
 Per Dr. Ellin Saba, Kdur 20 meq sent to pharmacy.  She was instructed to take 2 today and 1 daily thereafter.  Verbalized understanding.

## 2023-07-14 ENCOUNTER — Inpatient Hospital Stay: Payer: Self-pay | Admitting: Hematology

## 2023-07-16 IMAGING — CT CT HEAD W/O CM
4 series · 16 of 47 positions shown, 18 images · non-contrast
Comparison: Head CT 04/17/2018

CLINICAL DATA: Syncope with right ear laceration.



[Series 2: head bone · axial · 0.43mm/px · z∈[-78,-44]mm · 3 of 86 slices shown]
[im 9/86  bone]
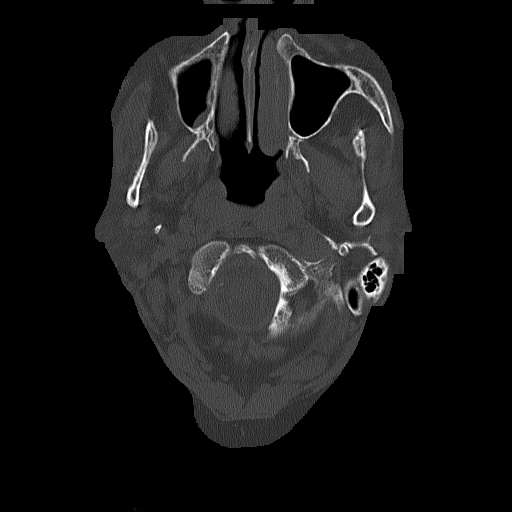
[im 18/86  bone]
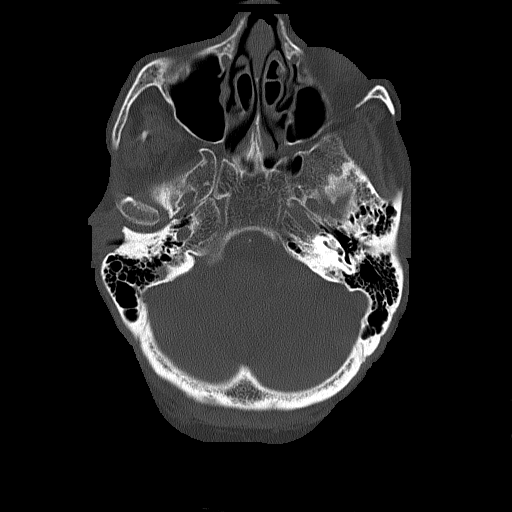
[im 26/86  bone]
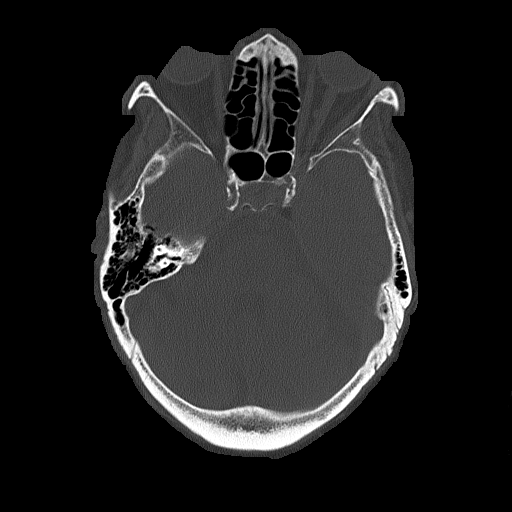

[Series 3: head w o · axial · 0.43mm/px · z∈[-74,+51]mm · 7 of 35 slices shown, 9 images]
[im 5/35  brain]
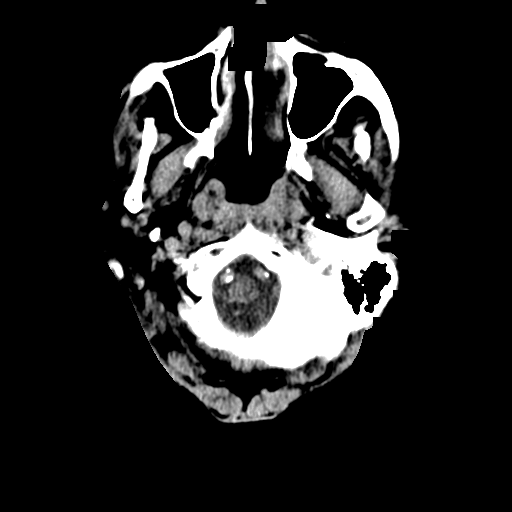
[im 5/35  bone]
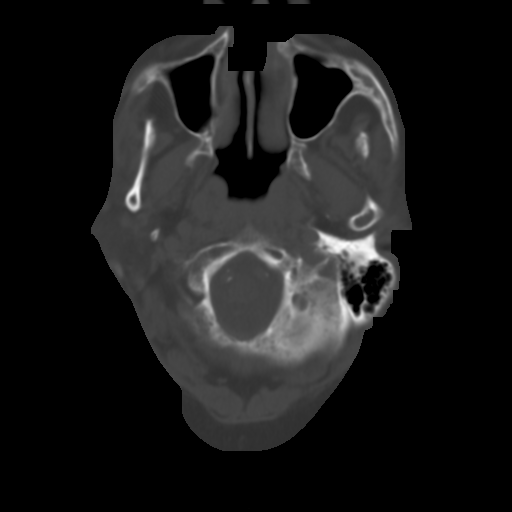
[im 9/35  brain]
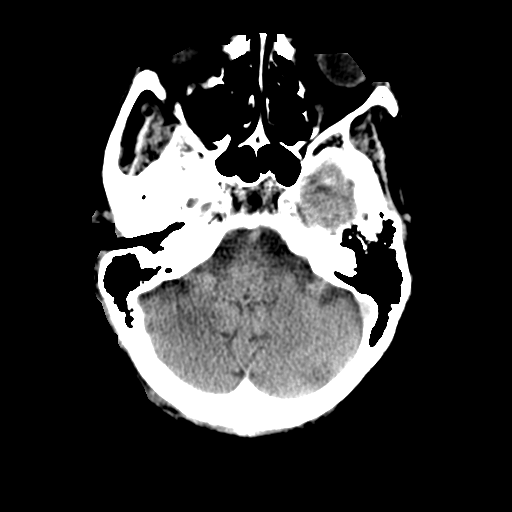
[im 13/35  brain]
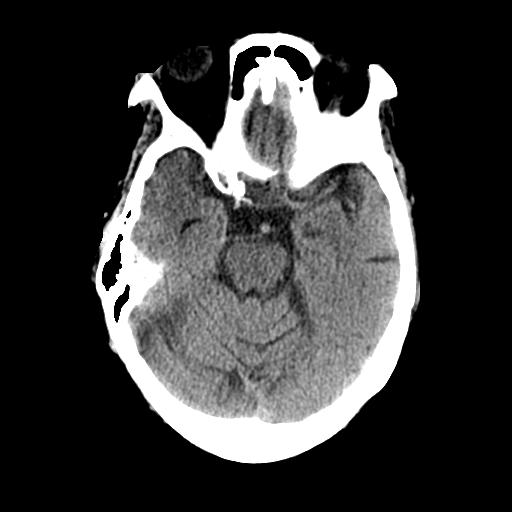
[im 18/35  brain]
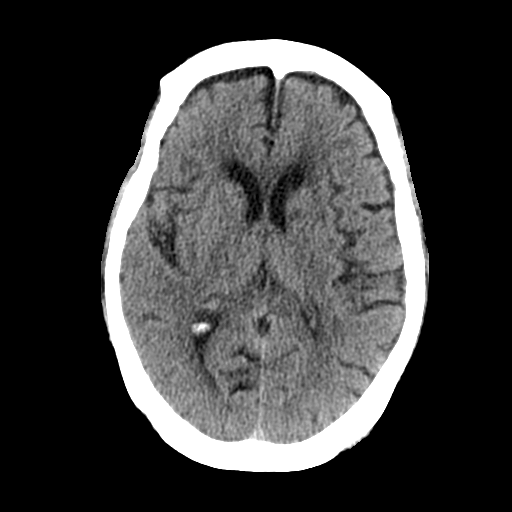
[im 22/35  brain]
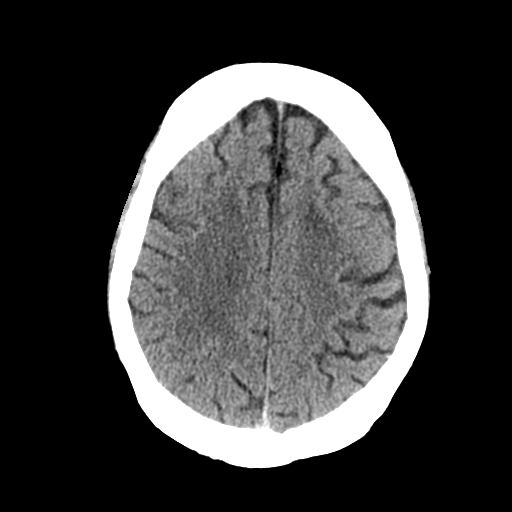
[im 22/35  bone]
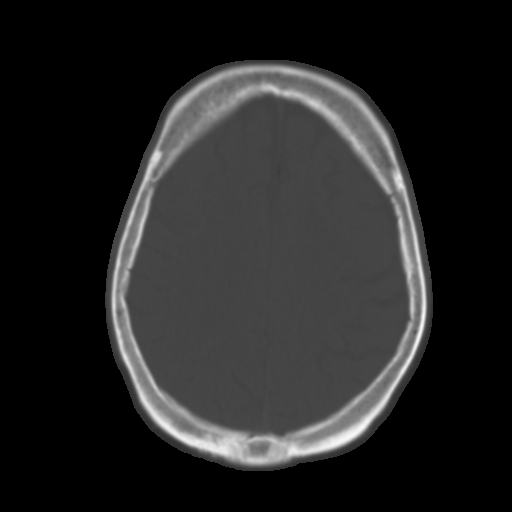
[im 26/35  brain]
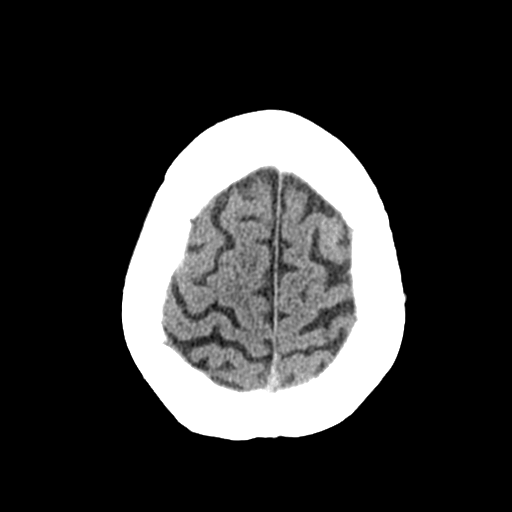
[im 30/35  brain]
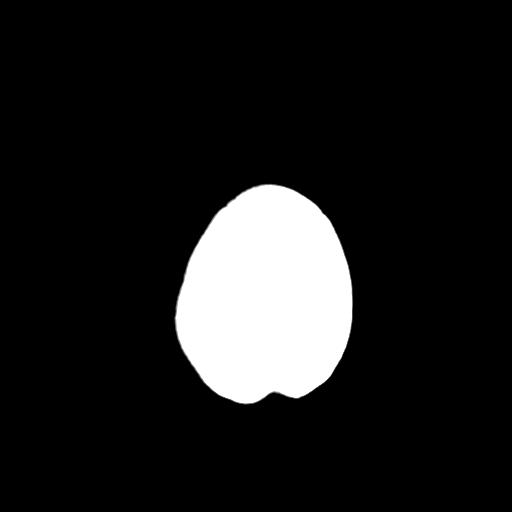

[Series 4: coronal soft · coronal · 0.35mm/px · 3 of 74 slices shown]
[im 25/74  brain]
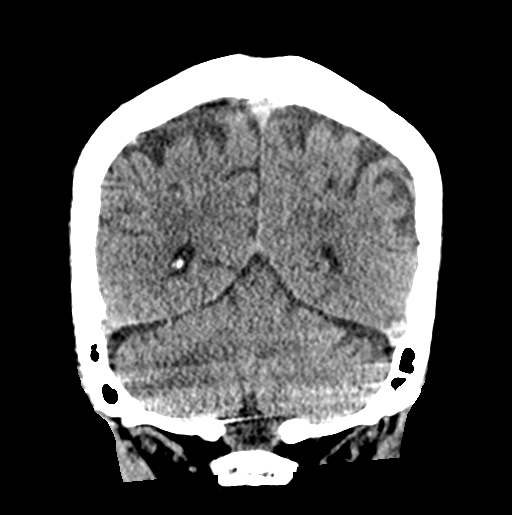
[im 33/74  brain]
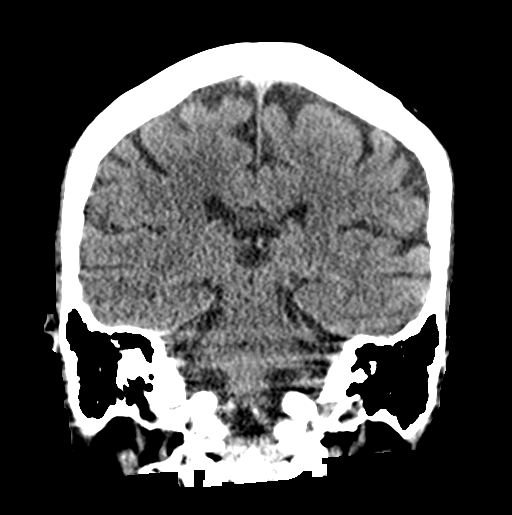
[im 41/74  brain]
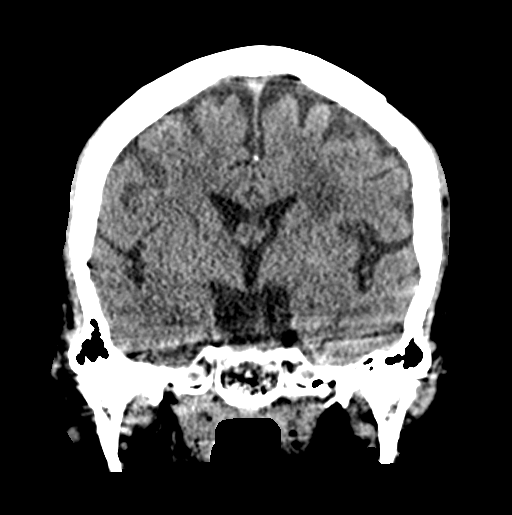

[Series 5: sagittal soft · sagittal · 0.37mm/px · 3 of 60 slices shown]
[im 20/60  brain]
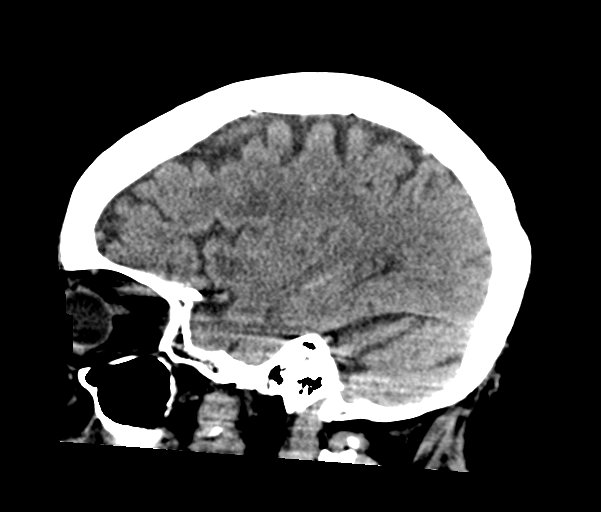
[im 30/60  brain]
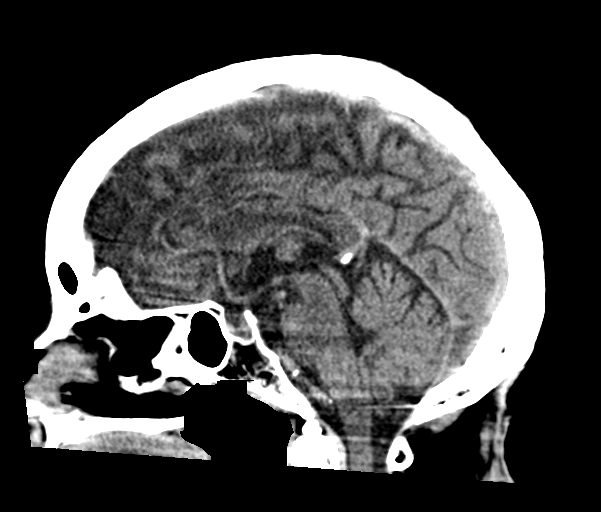
[im 40/60  brain]
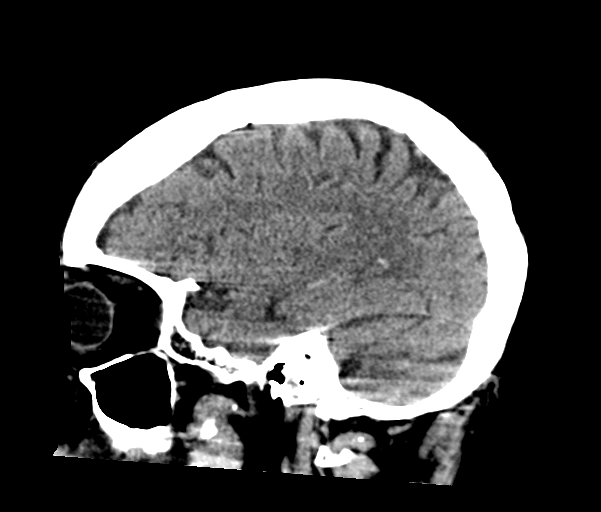

[16 of 47 positions shown; findings below may reference images not displayed]

FINDINGS: CT HEAD FINDINGS

Brain: There is no evidence for acute hemorrhage, hydrocephalus,
mass lesion, or abnormal extra-axial fluid collection. No definite
CT evidence for acute infarction. Patchy low attenuation in the deep
hemispheric and periventricular white matter is nonspecific, but
likely reflects chronic microvascular ischemic demyelination.

Vascular: No hyperdense vessel or unexpected calcification.

Skull: No evidence for fracture. No worrisome lytic or sclerotic
lesion.

Sinuses/Orbits: The visualized paranasal sinuses and mastoid air
cells are clear. Visualized portions of the globes and intraorbital
fat are unremarkable.

Other: None.

CT CERVICAL SPINE FINDINGS

Alignment: Normal.

Skull base and vertebrae: No acute fracture. No primary bone lesion
or focal pathologic process.

Soft tissues and spinal canal: No prevertebral fluid or swelling. No
visible canal hematoma.

Disc levels: Intervertebral disc spaces are preserved. The facets
are well aligned.

Upper chest: Unremarkable.

Other: None.
IMPRESSION: 1. No acute intracranial abnormality.
2. Chronic small vessel ischemic disease.
3. No evidence for cervical spine fracture or subluxation.

## 2023-07-23 ENCOUNTER — Emergency Department (HOSPITAL_COMMUNITY)

## 2023-07-23 ENCOUNTER — Emergency Department (HOSPITAL_COMMUNITY)
Admission: EM | Admit: 2023-07-23 | Discharge: 2023-07-23 | Disposition: A | Discharge status: Home / Self Care | Attending: Emergency Medicine | Admitting: Emergency Medicine

## 2023-07-23 ENCOUNTER — Other Ambulatory Visit: Payer: Self-pay

## 2023-07-23 DIAGNOSIS — S0990XA Unspecified injury of head, initial encounter: Secondary | ICD-10-CM | POA: Insufficient documentation

## 2023-07-23 DIAGNOSIS — F17210 Nicotine dependence, cigarettes, uncomplicated: Secondary | ICD-10-CM | POA: Insufficient documentation

## 2023-07-23 DIAGNOSIS — I10 Essential (primary) hypertension: Secondary | ICD-10-CM | POA: Insufficient documentation

## 2023-07-23 DIAGNOSIS — Y92481 Parking lot as the place of occurrence of the external cause: Secondary | ICD-10-CM | POA: Diagnosis not present

## 2023-07-23 DIAGNOSIS — R0602 Shortness of breath: Secondary | ICD-10-CM | POA: Diagnosis present

## 2023-07-23 DIAGNOSIS — E119 Type 2 diabetes mellitus without complications: Secondary | ICD-10-CM | POA: Diagnosis not present

## 2023-07-23 DIAGNOSIS — Z7951 Long term (current) use of inhaled steroids: Secondary | ICD-10-CM | POA: Diagnosis not present

## 2023-07-23 DIAGNOSIS — J441 Chronic obstructive pulmonary disease with (acute) exacerbation: Secondary | ICD-10-CM | POA: Insufficient documentation

## 2023-07-23 DIAGNOSIS — Z7901 Long term (current) use of anticoagulants: Secondary | ICD-10-CM | POA: Diagnosis not present

## 2023-07-23 DIAGNOSIS — Z79899 Other long term (current) drug therapy: Secondary | ICD-10-CM | POA: Insufficient documentation

## 2023-07-23 DIAGNOSIS — W1830XA Fall on same level, unspecified, initial encounter: Secondary | ICD-10-CM | POA: Diagnosis not present

## 2023-07-23 DIAGNOSIS — Z85118 Personal history of other malignant neoplasm of bronchus and lung: Secondary | ICD-10-CM | POA: Insufficient documentation

## 2023-07-23 LAB — COMPREHENSIVE METABOLIC PANEL WITH GFR
ALT: 10 U/L (ref 0–44)
AST: 23 U/L (ref 15–41)
Albumin: 3.6 g/dL (ref 3.5–5.0)
Alkaline Phosphatase: 101 U/L (ref 38–126)
Anion gap: 14 (ref 5–15)
BUN: 11 mg/dL (ref 8–23)
CO2: 23 mmol/L (ref 22–32)
Calcium: 9.4 mg/dL (ref 8.9–10.3)
Chloride: 99 mmol/L (ref 98–111)
Creatinine, Ser: 1.29 mg/dL — ABNORMAL HIGH (ref 0.44–1.00)
GFR, Estimated: 45 mL/min — ABNORMAL LOW (ref >60)
Glucose, Bld: 110 mg/dL — ABNORMAL HIGH (ref 70–99)
Potassium: 4.2 mmol/L (ref 3.5–5.1)
Sodium: 136 mmol/L (ref 135–145)
Total Bilirubin: 0.7 mg/dL (ref 0.0–1.2)
Total Protein: 7.4 g/dL (ref 6.5–8.1)

## 2023-07-23 LAB — CBC WITH DIFFERENTIAL/PLATELET
Abs Immature Granulocytes: 0.02 10*3/uL (ref 0.00–0.07)
Basophils Absolute: 0.1 10*3/uL (ref 0.0–0.1)
Basophils Relative: 1 %
Eosinophils Absolute: 0.4 10*3/uL (ref 0.0–0.5)
Eosinophils Relative: 5 %
HCT: 44.7 % (ref 36.0–46.0)
Hemoglobin: 14.5 g/dL (ref 12.0–15.0)
Immature Granulocytes: 0 %
Lymphocytes Relative: 32 %
Lymphs Abs: 3 10*3/uL (ref 0.7–4.0)
MCH: 31.9 pg (ref 26.0–34.0)
MCHC: 32.4 g/dL (ref 30.0–36.0)
MCV: 98.2 fL (ref 80.0–100.0)
Monocytes Absolute: 0.8 10*3/uL (ref 0.1–1.0)
Monocytes Relative: 9 %
Neutro Abs: 5 10*3/uL (ref 1.7–7.7)
Neutrophils Relative %: 53 %
Platelets: 298 10*3/uL (ref 150–400)
RBC: 4.55 MIL/uL (ref 3.87–5.11)
RDW: 17.2 % — ABNORMAL HIGH (ref 11.5–15.5)
WBC: 9.3 10*3/uL (ref 4.0–10.5)
nRBC: 0 % (ref 0.0–0.2)

## 2023-07-23 LAB — BLOOD GAS, VENOUS
Acid-Base Excess: 3.8 mmol/L — ABNORMAL HIGH (ref 0.0–2.0)
Bicarbonate: 30.9 mmol/L — ABNORMAL HIGH (ref 20.0–28.0)
Drawn by: 442
O2 Saturation: 55.1 %
Patient temperature: 36.6
pCO2, Ven: 55 mmHg (ref 44–60)
pH, Ven: 7.36 (ref 7.25–7.43)
pO2, Ven: 34 mmHg (ref 32–45)

## 2023-07-23 MED ORDER — SODIUM CHLORIDE 0.9 % IV BOLUS
1000.0000 mL | Freq: Once | INTRAVENOUS | Status: AC
  Administered 2023-07-23: 1000 mL via INTRAVENOUS

## 2023-07-23 MED ORDER — ALBUTEROL SULFATE HFA 108 (90 BASE) MCG/ACT IN AERS
2.0000 | INHALATION_SPRAY | RESPIRATORY_TRACT | Status: DC | PRN
Start: 2023-07-23 — End: 2023-07-23

## 2023-07-23 MED ORDER — METHYLPREDNISOLONE SODIUM SUCC 125 MG IJ SOLR
125.0000 mg | Freq: Once | INTRAMUSCULAR | Status: AC
  Administered 2023-07-23: 125 mg via INTRAVENOUS
  Filled 2023-07-23: qty 2

## 2023-07-23 MED ORDER — AMOXICILLIN-POT CLAVULANATE 875-125 MG PO TABS: 1.0000 | ORAL_TABLET | Freq: Two times a day (BID) | ORAL | 0 refills | Status: DC

## 2023-07-23 MED ORDER — PREDNISONE 50 MG PO TABS
50.0000 mg | ORAL_TABLET | Freq: Every day | ORAL | 0 refills | Status: AC
Start: 2023-07-23 — End: 2023-07-28

## 2023-07-23 MED ORDER — LABETALOL HCL 5 MG/ML IV SOLN
10.0000 mg | Freq: Once | INTRAVENOUS | Status: AC
  Administered 2023-07-23: 10 mg via INTRAVENOUS
  Filled 2023-07-23: qty 4

## 2023-07-23 MED ORDER — IPRATROPIUM-ALBUTEROL 0.5-2.5 (3) MG/3ML IN SOLN
3.0000 mL | Freq: Once | RESPIRATORY_TRACT | Status: AC
  Administered 2023-07-23: 3 mL via RESPIRATORY_TRACT
  Filled 2023-07-23: qty 3

## 2023-07-23 NOTE — Discharge Instructions (Addendum)
Follow up with your md in 2 days °

## 2023-07-23 NOTE — ED Provider Notes (Signed)
 [place] EMERGENCY DEPARTMENT AT [facility] Provider Note  CSN: [id number] Arrival date & time: 07/23/23 1210  Chief Complaint(s) Fall and Shortness of Breath  HPI Holly May is a 73 y.o. female history of COPD, lung cancer, hypertension, diabetes presenting to the emergency department with shortness of breath.  Patient reports that she was standing in a parking lot when she fell over backwards she reports that she did not lose consciousness nor did she have any prodromal symptoms such as lightheadedness, chest pain, palpitations but just fell backwards.  She was able to get up on her own.  Reports headache.  She also reports over the past few weeks she has been having increased shortness of breath and cough.  History of COPD and has been using her inhalers as prescribed.  No leg swelling.  No fevers or chills.  No abdominal pain.  No back pain.   Past Medical History Past Medical History:  Diagnosis Date   Arthritis    Cancer (HCC)    lung   COPD (chronic obstructive pulmonary disease) (HCC)    Depression    Dyspnea    Essential hypertension, benign    Type 2 diabetes mellitus (HCC)    Urinary incontinence    Patient Active Problem List   Diagnosis Date Noted   Acute respiratory failure with hypoxia (HCC) 05/31/2023   Urinary tract infection 05/31/2023   Renal insufficiency 05/31/2023   History of lung cancer 05/31/2023   Left hip pain 05/31/2023   Fall at home, initial encounter 05/31/2023   Cardiac arrhythmia 02/11/2023   Malignant neoplasm of unspecified part of left bronchus or lung (HCC) 02/11/2023   Anorexia 02/11/2023   PAF (paroxysmal atrial fibrillation) (HCC) 01/17/2023   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) 07/30/2022   Atrial tachycardia (HCC) 07/17/2022   CAP (community acquired pneumonia) 07/17/2022   COPD exacerbation (HCC) 07/17/2022   Sepsis (HCC) 07/16/2022   Abnormal CT lung screening 07/09/2022   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   Hyperlipidemia 07/09/2022   Insomnia 07/09/2022   Leukocytosis 07/09/2022   Major depression, single episode 07/09/2022   Neurogenic dysfunction of the urinary bladder 07/09/2022   Osteoporosis 07/09/2022   Other specified disorders of kidney and ureter 07/09/2022   Other specified respiratory disorders 07/09/2022   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   Vitamin D deficiency 07/09/2022   Wedge compression fracture of unspecified thoracic vertebra, initial encounter for closed fracture (HCC) 07/09/2022   Pulmonary nodule 06/20/2022   Abnormal PET of left lung 06/20/2022   GERD (gastroesophageal reflux disease) 04/16/2022   Multiple pulmonary nodules determined by computed tomography of lung 04/06/2022   History of thymoma 03/31/2019   Mediastinal mass 02/18/2019   Closed displaced fracture of proximal phalanx of left middle finger 04/10/2017   Acute on chronic respiratory failure (HCC) 06/12/2016   COPD GOLD 3/ group E  still smoking 06/12/2016   Elevated troponin 06/12/2016   Hypokalemia 06/12/2016   Acute renal failure (ARF) (HCC) 06/12/2016   Preoperative cardiovascular examination 05/18/2014   Prolonged QT interval 05/18/2014   Humerus shaft fracture 04/06/2013   Closed fracture of part of upper end of humerus 04/06/2013   Syncope 03/31/2013   Fracture of left humerus 03/31/2013   DM type 2 (diabetes mellitus, type 2) (HCC) 03/31/2013   Essential hypertension 03/31/2013   Heme positive stool 03/31/2013   Normocytic anemia 03/31/2013   Cigarette smoker 03/31/2013   Fracture of thoracic spine (HCC) 06/23/2011   Hemomediastinum 06/22/2011  Multiple rib fractures 06/22/2011   SDH (subdural hematoma) (HCC) 06/22/2011   Home Medication(s) Prior to Admission medications   Medication Sig Start Date End Date Taking? Authorizing Provider  amoxicillin -clavulanate (AUGMENTIN ) 875-125 MG tablet Take 1 tablet by mouth every 12 (twelve) hours. 07/23/23  Yes Mordecai Applebaum, MD   predniSONE  (DELTASONE ) 50 MG tablet Take 1 tablet (50 mg total) by mouth daily with breakfast for 5 days. 07/23/23 07/28/23 Yes Mordecai Applebaum, MD  albuterol  (PROVENTIL  HFA;VENTOLIN  HFA) 108 (90 Base) MCG/ACT inhaler Inhale 2 puffs into the lungs every 4 (four) hours as needed for wheezing or shortness of breath (cough, shortness of breath or wheezing.). Patient taking differently: Inhale 2 puffs into the lungs every 4 (four) hours as needed for wheezing or shortness of breath (cough). 02/18/18   Mason Sole, Pratik D, DO  alendronate (FOSAMAX) 70 MG tablet Take 70 mg by mouth once a week. Wednesday 06/17/22   [provider]  apixaban  (ELIQUIS ) 5 MG TABS tablet Take 1 tablet (5 mg total) by mouth 2 (two) times daily. 05/01/23   Mallipeddi, Vishnu P, MD  bisoprolol  (ZEBETA ) 5 MG tablet Take 1 tablet (5 mg total) by mouth daily. 05/01/23   Mallipeddi, Vishnu P, MD  citalopram  (CELEXA ) 20 MG tablet Take 1 tablet (20 mg total) by mouth daily. 06/14/16   Johnson, Clanford L, MD  diltiazem  (CARTIA  XT) 240 MG 24 hr capsule Take 1 capsule (240 mg total) by mouth daily. 06/06/23 06/05/24  Elgergawy, Ardia Kraft, MD  fluticasone  furoate-vilanterol (BREO ELLIPTA ) 100-25 MCG/ACT AEPB Inhale 1 puff into the lungs daily. 05/27/23   [provider]  gabapentin  (NEURONTIN ) 300 MG capsule Take 300 mg by mouth daily.    [provider]  ipratropium-albuterol  (DUONEB) 0.5-2.5 (3) MG/3ML SOLN Take 3 mLs by nebulization every 6 (six) hours as needed (wheezing, shortness of breath). 06/11/22   [provider]  lovastatin (MEVACOR) 40 MG tablet Take 40 mg by mouth daily.    [provider]  mirtazapine (REMERON) 15 MG tablet Take 15 mg by mouth at bedtime.    [provider]  olmesartan  (BENICAR ) 40 MG tablet Take 1 tablet (40 mg total) by mouth daily. 02/11/23 02/11/24  Diamond Formica, MD  oxybutynin  (DITROPAN ) 5 MG tablet Take 5 mg by mouth 2 (two) times daily.    [provider]  pantoprazole  (PROTONIX ) 40 MG tablet Take 1 tablet (40 mg total) by mouth daily. 06/07/23   Elgergawy, Ardia Kraft, MD  potassium chloride  SA (KLOR-CON  M) 20 MEQ tablet Take 1 tablet (20 mEq total) by mouth daily. 07/08/23   Paulett Boros, MD                                                                                                                                    Past Surgical History Past Surgical History:  Procedure Laterality Date   BRONCHIAL BIOPSY  07/09/2022  Procedure: BRONCHIAL BIOPSIES;  Surgeon: Prudy Brownie, DO;  Location: [facility];  Service: Pulmonary;;   BRONCHIAL BRUSHINGS  07/09/2022   Procedure: BRONCHIAL BRUSHINGS;  Surgeon: Prudy Brownie, DO;  Location: [facility];  Service: Pulmonary;;   BRONCHIAL NEEDLE ASPIRATION BIOPSY  07/09/2022   Procedure: BRONCHIAL NEEDLE ASPIRATION BIOPSIES;  Surgeon: Prudy Brownie, DO;  Location: [facility];  Service: Pulmonary;;   CHOLECYSTECTOMY     FIDUCIAL MARKER PLACEMENT  07/09/2022   Procedure: FIDUCIAL MARKER PLACEMENT;  Surgeon: Prudy Brownie, DO;  Location: [facility];  Service: Pulmonary;;   FRACTURE SURGERY Left    arm   OPEN REDUCTION INTERNAL FIXATION (ORIF) PROXIMAL PHALANX Left 04/11/2017   Procedure: OPEN REDUCTION INTERNAL FIXATION (ORIF) PROXIMAL PHALANX;  Surgeon: Florida Hurter, MD;  Location: [facility];  Service: Orthopedics;  Laterality: Left;   RESECTION OF MEDIASTINAL MASS N/A 02/18/2019   Procedure: RESECTION OF MEDIASTINAL MASS;  Surgeon: Heriberto London, MD;  Location[facility] OR;  Service: Thoracic;  Laterality: N/A;   STERNOTOMY N/A 02/18/2019   Procedure: STERNOTOMY;  Surgeon: Heriberto London, MD;  Location: [facility] OR;  Service: Thoracic;  Laterality: N/A;   VIDEO BRONCHOSCOPY WITH ENDOBRONCHIAL ULTRASOUND Bilateral 07/09/2022   Procedure: VIDEO BRONCHOSCOPY WITH ENDOBRONCHIAL ULTRASOUND;  Surgeon: Prudy Brownie, DO;  Location: [facility];  Service: Pulmonary;   Laterality: Bilateral;   Family History Family History  Problem Relation Age of Onset   Diabetes Mellitus II Mother    Diabetes Mellitus II Sister    Hypertension Sister    Hypertension Brother     Social History Social History   Tobacco Use   Smoking status: Every Day    Current packs/day: 1.50    Average packs/day: 1.5 packs/day for 52.5 years (78.8 ttl pk-yrs)    Types: Cigarettes    Start date: 01/09/1971   Smokeless tobacco: Never   Tobacco comments:    3 cigarettes smoked daily ARJ, 07/26/22  Vaping Use   Vaping status: Never Used  Substance Use Topics   Alcohol use: Yes    Comment: occ   Drug use: No   Allergies Patient has no known allergies.  Review of Systems Review of Systems  All other systems reviewed and are negative.   Physical Exam Vital Signs  I have reviewed the triage vital signs BP (!) 210/94   Pulse (!) 58   Temp 97.6 F (36.4 C) (Oral)   Resp 17   Ht 5\' 7"  (1.702 m)   Wt 63.5 kg   SpO2 92%   BMI 21.93 kg/m  Physical Exam Vitals and nursing note reviewed.  Constitutional:      General: She is not in acute distress.    Appearance: She is well-developed.  HENT:     Head: Normocephalic and atraumatic.     Mouth/Throat:     Mouth: Mucous membranes are moist.  Eyes:     Pupils: Pupils are equal, round, and reactive to light.  Cardiovascular:     Rate and Rhythm: Normal rate and regular rhythm.     Heart sounds: No murmur heard. Pulmonary:     Effort: Pulmonary effort is normal. No respiratory distress.     Breath sounds: Examination of the right-upper field reveals wheezing. Examination of the left-upper field reveals wheezing. Examination of the right-middle field reveals wheezing. Examination of the left-middle field reveals wheezing. Examination of the right-lower field reveals wheezing. Examination of the left-lower field reveals wheezing. Wheezing present.  Abdominal:  General: Abdomen is flat.     Palpations: Abdomen is  soft.     Tenderness: There is no abdominal tenderness.  Musculoskeletal:        General: No tenderness.     Right lower leg: No edema.     Left lower leg: No edema.  Skin:    General: Skin is warm and dry.  Neurological:     General: No focal deficit present.     Mental Status: She is alert. Mental status is at baseline.  Psychiatric:        Mood and Affect: Mood normal.        Behavior: Behavior normal.     ED Results and Treatments Labs (all labs ordered are listed, but only abnormal results are displayed) Labs Reviewed  COMPREHENSIVE METABOLIC PANEL WITH GFR - Abnormal; Notable for the following components:      Result Value   Glucose, Bld 110 (*)    Creatinine, Ser 1.29 (*)    GFR, Estimated 45 (*)    All other components within normal limits  CBC WITH DIFFERENTIAL/PLATELET - Abnormal; Notable for the following components:   RDW 17.2 (*)    All other components within normal limits  BLOOD GAS, VENOUS - Abnormal; Notable for the following components:   Bicarbonate 30.9 (*)    Acid-Base Excess 3.8 (*)    All other components within normal limits                                                                                                                          Radiology DG Chest Port 1 View Result Date: 07/23/2023 CLINICAL DATA:  Cough, chest congestion and shortness of breath. Fell. EXAM: PORTABLE CHEST 1 VIEW COMPARISON:  06/07/2023 FINDINGS: Borderline enlarged cardiac silhouette. Hyperexpanded lungs. Small amount of linear density in the left upper lung zone. No airspace consolidation or pleural fluid. Median sternotomy wires. Single left upper lung zone surgical clip, unchanged. Old, healed left rib fractures. No acute fracture or pneumothorax seen. IMPRESSION: 1. No acute abnormality. 2. COPD. 3. Small amount of left upper lung zone subsegmental atelectasis or scarring. 4. Borderline cardiomegaly. Electronically Signed   By: Catherin Closs M.D.   On: 07/23/2023 13:46     Pertinent labs & imaging results that were available during my care of the patient were reviewed by me and considered in my medical decision making (see MDM for details).  Medications Ordered in ED Medications  albuterol  (VENTOLIN  HFA) 108 (90 Base) MCG/ACT inhaler 2 puff (has no administration in time range)  sodium chloride  0.9 % bolus 1,000 mL (1,000 mLs Intravenous Bolus 07/23/23 1342)  methylPREDNISolone  sodium succinate (SOLU-MEDROL ) 125 mg/2 mL injection 125 mg (125 mg Intravenous Given 07/23/23 1342)  ipratropium-albuterol  (DUONEB) 0.5-2.5 (3) MG/3ML nebulizer solution 3 mL (3 mLs Nebulization Given 07/23/23 1435)  labetalol  (NORMODYNE ) injection 10 mg (10 mg Intravenous Given 07/23/23 1535)  Procedures Procedures  (including critical care time)  Medical Decision Making / ED Course   MDM:  73 year old presenting to the emergency department with fall, shortness of breath.  Patient well-appearing, physical examination with some diffuse mild wheezing.  Suspect symptoms related to COPD exacerbation.  Chest x-ray obtained with no evidence of pneumothorax or focal infiltrate to suggest pneumonia.  No focal findings on pulmonary exam to suggest pneumonia.  Given increased sputum production with give prescription for steroids and antibiotics.  Will give DuoNeb and reassess.  Lower concern for alternative process, patient has no chest pain to suggest ACS, pulmonary embolism and is on chronic anticoagulation.  No evidence of CO2 retention on laboratory testing.  Patient also did have a fall and hit her head.  She is not really able to explain why she fell but denies any loss of consciousness or syncopal or presyncopal symptoms to suggest syncope.  No evidence of any other injuries.  She is on Eliquis  and elderly so we will obtain CT head and CT cervical  spine.  Signed out to Dr. Bryna Car pending CT scans.       Additional history obtained: -Additional history obtained from family -External records from outside source obtained and reviewed including: Chart review including previous notes, labs, imaging, consultation notes including prior notes    Lab Tests: -I ordered, reviewed, and interpreted labs.   The pertinent results include:   Labs Reviewed  COMPREHENSIVE METABOLIC PANEL WITH GFR - Abnormal; Notable for the following components:      Result Value   Glucose, Bld 110 (*)    Creatinine, Ser 1.29 (*)    GFR, Estimated 45 (*)    All other components within normal limits  CBC WITH DIFFERENTIAL/PLATELET - Abnormal; Notable for the following components:   RDW 17.2 (*)    All other components within normal limits  BLOOD GAS, VENOUS - Abnormal; Notable for the following components:   Bicarbonate 30.9 (*)    Acid-Base Excess 3.8 (*)    All other components within normal limits       Imaging Studies ordered: I ordered imaging studies including CXR On my interpretation imaging demonstrates cough I independently visualized and interpreted imaging. I agree with the radiologist interpretation   Medicines ordered and prescription drug management: Meds ordered this encounter  Medications   albuterol  (VENTOLIN  HFA) 108 (90 Base) MCG/ACT inhaler 2 puff   sodium chloride  0.9 % bolus 1,000 mL   methylPREDNISolone  sodium succinate (SOLU-MEDROL ) 125 mg/2 mL injection 125 mg   ipratropium-albuterol  (DUONEB) 0.5-2.5 (3) MG/3ML nebulizer solution 3 mL   labetalol  (NORMODYNE ) injection 10 mg   amoxicillin -clavulanate (AUGMENTIN ) 875-125 MG tablet    Sig: Take 1 tablet by mouth every 12 (twelve) hours.    Dispense:  14 tablet    Refill:  0   predniSONE  (DELTASONE ) 50 MG tablet    Sig: Take 1 tablet (50 mg total) by mouth daily with breakfast for 5 days.    Dispense:  5 tablet    Refill:  0    -I have reviewed the patients home  medicines and have made adjustments as needed   Cardiac Monitoring: The patient was maintained on a cardiac monitor.  I personally viewed and interpreted the cardiac monitored which showed an underlying rhythm of: NSR  Reevaluation: After the interventions noted above, I reevaluated the patient and found that their symptoms have improved  Co morbidities that complicate the patient evaluation  Past Medical History:  Diagnosis Date  Arthritis    Cancer (HCC)    lung   COPD (chronic obstructive pulmonary disease) (HCC)    Depression    Dyspnea    Essential hypertension, benign    Type 2 diabetes mellitus (HCC)    Urinary incontinence       Dispostion: Disposition decision including need for hospitalization was considered, and patient disposition pending at time of sign out.    Final Clinical Impression(s) / ED Diagnoses Final diagnoses:  COPD exacerbation (HCC)     This chart was dictated using voice recognition software.  Despite best efforts to proofread,  errors can occur which can change the documentation meaning.    Mordecai Applebaum, MD 07/23/23 1540

## 2023-07-23 NOTE — ED Triage Notes (Signed)
 Pt fell from standing position in a parking lot. Pt head is hurting. Pt has a congested cough and has been more SOB. Pt is on 2lpm baseline.

## 2023-07-23 NOTE — ED Notes (Signed)
 EDP notified of increasing BP

## 2023-08-15 ENCOUNTER — Other Ambulatory Visit (HOSPITAL_COMMUNITY)

## 2023-08-15 ENCOUNTER — Ambulatory Visit (HOSPITAL_COMMUNITY): Attending: Hematology

## 2023-08-24 NOTE — Progress Notes (Unsigned)
 Holly May, female    DOB: [birth year]    MRN: [id number]   Brief patient profile:  22  yobf  active smoker  referred to pulmonary clinic in [place]  04/05/2022 by Twylla Galen  for SPN in setting  variable doe attributed to  Copd / AB  x  2017    S/p thymomectomy by Eleno Griffins 2020 s adjuctive rx with preop pfts c/w GOLD 3 copd    History of Present Illness  04/05/2022  Pulmonary/ 1st office eval/ Nikalas Bramel / [facility] on acei and DPI  Chief Complaint  Patient presents with   Consult    SOB   Dyspnea:  mb and back 275ft  each way level and so short of breath  sometimes has to stop but varies s pattern  Cough: clear mucus variably producttive esp p lie down sometimes wakes her up p asleep/ flat 2 pillows  SABA use: 3 x weekly  02: none Rec We will walk you today to see if you qualify for 0xygen  Start Trelegy 100 one click each For cough / congestion > mucinex  1200 mg every 12 hours as needed  Levaquin  500 mg one daily x 10 days  CT chest needs to be done after Feb 3rd 2024  The key is to stop smoking completely before smoking completely stops you! 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 > SBRT  /turned down for resection by Luna Salinas   08/21/2022  f/u ov/[place] office/Mela Perham re: GOLD 3 copd  maint on trelegy  Chief Complaint  Patient presents with   Follow-up    Pt f/u states that shse is doing well she begins her cancer treatment next week, she does believe that her breathing is getting better  Dyspnea:  [employer] walking ok Cough: minimal / thinks she's no longer on acei  Sleeping: flat bed 3 pillows baseline  SABA use: 3 x weekly / neb rarely  02: none  Rec Call us  back with the spelling of the new blood pressure pill  Plan A = Automatic = Always=   Trelegy 100 one puff each am   Plan B =  Backup (to supplement plan A, not to replace it) Only use your albuterol  inhaler as a rescue  Plan C = Crisis (instead of Plan B but only if Plan B stops working) - only use your albuterol  nebulizer if you first try Plan  Please schedule a follow up visit in 3 months but call sooner if needed  with all medications /inhalers/ solutions in hand    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   02/11/2023  f/u ov/[place] office/Quashaun Lazalde re: GOLD 3  maint on trelegy 100  did  bring meds (includes lisinopril  40 and fosamax)  Chief Complaint  Patient presents with   COPD GOLD 3/ group E still smoking   Dyspnea:  ? A little better, still doing [employer]/ uses HC  Cough: worse since last ov esp when lie down > min mucoid Sleeping: flat bed/ 2 pillows s    resp cc  SABA use: q 2-3 days  02: none  Rec Stop lisinopril   Plan A = Automatic = Always=  Trelegy 100 one click each  AM   - take your time! Work on inhaler technique:  Plan B = Backup (to supplement plan A, not to replace it) Only use your albuterol  inhaler as a rescue medication  Plan C = Crisis (instead of Plan B but only if Plan B stops working) - only use your albuterol  nebulizer if you first try Plan B  Stop lisinopril  and replace with benicar  (olemsartan)  40 mg one daily   Prednisone  10 mg  2 daily until better and one daily for a week and stop   Please schedule a follow up office visit in 6 weeks, call sooner if needed with all medications /inhalers/ solutions in hand    03/18/2023  f/u ov/[place] office/Braden Cimo re: GOLD 3 copd  maint on Trelegy 100    Chief Complaint  Patient presents with   COPD   Shortness of Breath  Dyspnea:  still walking at [employer] Cough: smoker's rattle > white  Sleeping: flat bed/ 2 pillows s    resp cc  SABA use: has proair  every few days  - nebulizer few times a week  02: none  Rec     08/29/2023  f/u ov/[place] office/Glennys Schorsch  re: *** maint on ***  No chief complaint on file.   Dyspnea:  *** Cough: *** Sleeping: ***   resp cc  SABA use: *** 02: ***  Lung cancer screening: ***   No obvious day to day or daytime variability or assoc excess/ purulent sputum or mucus plugs or hemoptysis or cp or chest tightness, subjective wheeze or overt sinus or hb symptoms.    Also denies any obvious fluctuation of symptoms with weather or environmental changes or other aggravating or alleviating factors except as outlined above   No unusual exposure hx or h/o childhood pna/ asthma or knowledge of premature birth.  Current Allergies, Complete Past Medical History, Past Surgical History, Family History, and Social History were reviewed in [employer] record.  ROS  The following are not active complaints unless bolded Hoarseness, sore throat, dysphagia, dental problems, itching, sneezing,  nasal congestion or discharge of excess mucus or purulent secretions, ear ache,   fever, chills, sweats, unintended wt loss or wt gain, classically pleuritic or exertional cp,  orthopnea pnd or arm/hand swelling  or leg swelling, presyncope, palpitations, abdominal pain, anorexia, nausea, vomiting, diarrhea  or change in bowel habits or change in bladder habits, change in stools or change in urine, dysuria, hematuria,  rash, arthralgias, visual complaints, headache, numbness, weakness or ataxia or problems with walking or coordination,  change in mood or  memory.        No outpatient medications have been marked as taking for the 08/29/23 encounter (Appointment) with Diamond Formica, MD.            Past Medical History:  Diagnosis Date   Arthritis    COPD (chronic obstructive pulmonary disease) (HCC)    Depression    Dyspnea    Essential hypertension, benign    Type 2 diabetes mellitus (HCC)    Urinary incontinence        Objective:    Wts  08/29/2023         ***  03/18/2023      141   02/11/2023        140  12/31/2022        136   08/21/2022        143  05/16/22 135 lb 12.8 oz (61.6 kg)  05/14/22 134  lb (60.8 kg)  04/05/22 134 lb 6.4 oz (61 kg)     Vital signs reviewed  08/29/2023  - Note at rest 02 sats  ***% on ***   General appearance:    ***     Mod barr***    Assessment

## 2023-08-26 ENCOUNTER — Inpatient Hospital Stay: Admitting: Hematology

## 2023-08-29 ENCOUNTER — Encounter: Payer: Self-pay | Admitting: Internal Medicine

## 2023-08-29 ENCOUNTER — Ambulatory Visit (HOSPITAL_COMMUNITY): Admission: RE | Admit: 2023-08-29 | Source: Ambulatory Visit

## 2023-08-29 ENCOUNTER — Ambulatory Visit: Admitting: Internal Medicine

## 2023-08-29 VITALS — BP 168/88 | HR 71 | Ht 67.0 in | Wt 144.6 lb

## 2023-08-29 DIAGNOSIS — F17210 Nicotine dependence, cigarettes, uncomplicated: Secondary | ICD-10-CM | POA: Diagnosis not present

## 2023-08-29 DIAGNOSIS — J449 Chronic obstructive pulmonary disease, unspecified: Secondary | ICD-10-CM | POA: Diagnosis not present

## 2023-08-29 DIAGNOSIS — G4734 Idiopathic sleep related nonobstructive alveolar hypoventilation: Secondary | ICD-10-CM

## 2023-08-29 NOTE — Assessment & Plan Note (Addendum)
 Active smoker - PFT's  02/16/2019  FEV1 0.87 (39 % ) ratio 0.39  p 7 % improvement from saba p ? prior to study with DLCO  10.88 (49%)   and FV curve classically concave  - 04/05/2022  After extensive coaching inhaler device,  effectiveness =    75% with dpi > trelegy trial  - d/c ACE 04/05/2022  - 04/05/2022   Walked on RA  x  3  lap(s) =  approx 450  ft  @ mod pace, stopped due to end of study with lowest 02 sats 95% no sob   - PFT's  06/03/22  FEV1 0.95 (38 % ) ratio 0.54  p 0 % improvement from saba p 0 prior to study with DLCO  10.31 (49%)   and FV curve mildly concave/somewhat atyical and ERV 6% at wt 144   - 02/11/2023  After extensive coaching inhaler device,  effectiveness =    75% with dpi, 25% with hfa  - 02/11/2023 try pred 20 until better then 10 mg daily x 5 d and off - 02/11/2023    d/c acei   AB rx BRE0 100 each am and approp saba:  Re SABA :  I spent extra time with pt today reviewing appropriate use of albuterol  for prn use on exertion with the following points: 1) saba is for relief of sob that does not improve by walking a slower pace or resting but rather if the pt does not improve after trying this first. 2) If the pt is convinced, as many are, that saba helps recover from activity faster then it's easy to tell if this is the case by re-challenging : ie stop, take the inhaler, then p 5 minutes try the exact same activity (intensity of workload) that just caused the symptoms and see if they are substantially diminished or not after saba 3) if there is an activity that reproducibly causes the symptoms, try the saba 15 min before the activity on alternate days   If in fact the saba really does help, then fine to continue to use it prn but advised may need to look closer at the maintenance regimen being used to achieve better control of airways disease with exertion.

## 2023-08-29 NOTE — Assessment & Plan Note (Signed)
 As of 08/29/2023 on 2lpm hs  - 08/29/2023   Walked on RA  x  2  lap(s) =  approx 300  ft  @ slow pace, stopped due to sob  with lowest 02 sats 97%    No need for portable 02 at this point

## 2023-08-29 NOTE — Assessment & Plan Note (Signed)
 Counseled re importance of smoking cessation but did not meet time criteria for separate billing    Each maintenance medication was reviewed in detail including emphasizing most importantly the difference between maintenance and prns and under what circumstances the prns are to be triggered using an action plan format where appropriate.  Total time for H and P, chart review, counseling, reviewing  DPI/hf /02/pulse ox  device(s) , directly observing portions of ambulatory 02 saturation study/ and generating customized AVS unique to this office visit / same day charting = 32 min

## 2023-08-29 NOTE — Patient Instructions (Addendum)
 Plan A = Automatic = Always=    BRE0 100 one click each am   Plan B = Backup (to supplement plan A, not to replace it) Only use your albuterol  inhaler as a rescue medication to be used if you can't catch your breath by resting or doing a relaxed purse lip breathing pattern.  - The less you use it, the better it will work when you need it. - Ok to use the inhaler up to 2 puffs  every 4 hours if you must but call for appointment if use goes up over your usual need - Don't leave home without it !!  (think of it like the spare tire for your car)    Plan C = Crisis (instead of Plan B but only if Plan B stops working) - only use your albuterol  nebulizer if you first try Plan B and it fails to help > ok to use the nebulizer up to every 4 hours but if start needing it regularly call for immediate appointment    The key is to stop smoking completely before smoking completely stops you!    Please schedule a follow up visit in 3 months but call sooner if needed  with all medications /inhalers/ solutions in hand so we can verify exactly what you are taking. This includes all medications from all doctors and over the counters

## 2023-09-25 ENCOUNTER — Ambulatory Visit (HOSPITAL_COMMUNITY)
Admission: RE | Admit: 2023-09-25 | Discharge: 2023-09-25 | Disposition: A | Discharge status: Home / Self Care | Source: Ambulatory Visit | Attending: Hematology | Admitting: Hematology

## 2023-09-25 DIAGNOSIS — C3412 Malignant neoplasm of upper lobe, left bronchus or lung: Secondary | ICD-10-CM | POA: Diagnosis present

## 2023-09-25 DIAGNOSIS — R911 Solitary pulmonary nodule: Secondary | ICD-10-CM | POA: Insufficient documentation

## 2023-09-25 LAB — POCT I-STAT CREATININE: Creatinine, Ser: 1.3 mg/dL — ABNORMAL HIGH (ref 0.44–1.00)

## 2023-09-25 MED ORDER — IOHEXOL 300 MG/ML  SOLN
60.0000 mL | Freq: Once | INTRAMUSCULAR | Status: AC | PRN
  Administered 2023-09-25: 60 mL via INTRAVENOUS

## 2023-10-02 ENCOUNTER — Inpatient Hospital Stay: Attending: Hematology | Admitting: Hematology

## 2023-11-06 ENCOUNTER — Other Ambulatory Visit: Payer: Self-pay | Admitting: Hematology

## 2023-11-27 ENCOUNTER — Ambulatory Visit: Admitting: Internal Medicine

## 2023-11-27 ENCOUNTER — Encounter: Payer: Self-pay | Admitting: Internal Medicine

## 2023-11-27 VITALS — BP 132/77 | HR 67 | Ht 67.0 in | Wt 144.0 lb

## 2023-11-27 DIAGNOSIS — F17210 Nicotine dependence, cigarettes, uncomplicated: Secondary | ICD-10-CM

## 2023-11-27 DIAGNOSIS — J449 Chronic obstructive pulmonary disease, unspecified: Secondary | ICD-10-CM

## 2023-11-27 DIAGNOSIS — G4734 Idiopathic sleep related nonobstructive alveolar hypoventilation: Secondary | ICD-10-CM

## 2023-11-27 MED ORDER — BUDESONIDE 0.25 MG/2ML IN SUSP: RESPIRATORY_TRACT | 12 refills | Status: DC

## 2023-11-27 MED ORDER — PREDNISONE 10 MG PO TABS: ORAL_TABLET | ORAL | 0 refills | Status: DC

## 2023-11-27 NOTE — Progress Notes (Signed)
 Holly May, female    DOB: 08/27/1950    MRN: [id number]   Brief patient profile:  46  yobf  active smoker  referred to pulmonary clinic in [place]  04/05/2022 by Karna Lukes  for SPN in setting  variable doe attributed to  Copd / AB  x  2017    S/p thymomectomy by Fleeta Brooke 2020 s adjuctive rx with preop pfts c/w GOLD 3 copd    History of Present Illness  04/05/2022  Pulmonary/ 1st office eval/ Beauregard Jarrells / [facility] on acei and DPI  Chief Complaint  Patient presents with   Consult    SOB   Dyspnea:  mb and back 268ft  each way level and so short of breath  sometimes has to stop but varies s pattern  Cough: clear mucus variably producttive esp p lie down sometimes wakes her up p asleep/ flat 2 pillows  SABA use: 3 x weekly  02: none Rec We will walk you today to see if you qualify for 0xygen  Start Trelegy 100 one click each For cough / congestion > mucinex  1200 mg every 12 hours as needed  Levaquin  500 mg one daily x 10 days  CT chest needs to be done after Feb 3rd 2024  The key is to stop smoking completely before smoking completely stops you! 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 > SBRT  /turned down for resection by Kerrin   08/21/2022  f/u ov/[place] office/Kandis Henry re: GOLD 3 copd  maint on trelegy  Chief Complaint  Patient presents with   Follow-up    Pt f/u states that shse is doing well she begins her cancer treatment next week, she does believe that her breathing is getting better  Dyspnea:  [employer] walking ok Cough: minimal / thinks she's no longer on acei  Sleeping: flat bed 3 pillows baseline  SABA use: 3 x weekly / neb rarely  02: none  Rec Call us  back with the spelling of the new blood pressure pill  Plan A = Automatic = Always=   Trelegy 100 one puff each am   Plan B = Backup (to supplement  plan A, not to replace it) Only use your albuterol  inhaler as a rescue  Plan C = Crisis (instead of Plan B but only if Plan B stops working) - only use your albuterol  nebulizer if you first try Plan  Please schedule a follow up visit in 3 months but call sooner if needed  with all medications /inhalers/ solutions in hand    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   02/11/2023  f/u ov/[place] office/Treavor Blomquist re: GOLD 3  maint on trelegy 100  did  bring meds (includes lisinopril  40 and fosamax)  Chief Complaint  Patient presents with   COPD GOLD 3/ group E still smoking   Dyspnea:  ? A little better, still doing [employer]/ uses [facility]  Cough: worse since last ov esp when lie down > min mucoid Sleeping: flat bed/ 2 pillows s    resp cc  SABA use: q 2-3 days  02: none  Rec Stop lisinopril   Plan A = Automatic = Always=  Trelegy 100 one click each AM   - take your  time! Work on inhaler technique:  Plan B = Backup (to supplement plan A, not to replace it) Only use your albuterol  inhaler as a rescue medication  Plan C = Crisis (instead of Plan B but only if Plan B stops working) - only use your albuterol  nebulizer if you first try Plan B  Stop lisinopril  and replace with benicar  (olemsartan)  40 mg one daily   Prednisone  10 mg  2 daily until better and one daily for a week and stop        Admit date: 05/30/2023 Discharge date: 06/06/2023  73 y.o. female with medical history significant of hypertension, paroxysmal atrial fibrillation, PAF, severe COPD, diabetes mellitus type 2, lung adenocarcinoma on radiation therapy, tobacco abuse presents with worsening cough and chills.  [name] with sepsis due to combination of UTI and community-acquired pneumonia, COPD exacerbation, acute on chronic diastolic CHF along with atrial fibrillation with RVR.    Sepsis, present on admission secondary to UTI and community-acquired pneumonia -She has  been on admission, resolved at time of discharge, she was treated appropriately with IV antibiotics, x-ray significant with opacity at the right lung base, treated with appropriate antibiotic during hospital stay, no further antibiotic needed at time of discharge     Acute respiratory failure with hypoxia, due to community-acquired pneumonia, COP exacerbation and acute on chronic diastolic CHF -She is with hypoxia upon presentation, increased work of breathing, this has much improved, no oxygen  requirement at time of discharge . -She was treated with scheduled nebs treatment, as needed albuterol , and IV steroids, respiratory status back to baseline, she will be discharged on prednisone  taper .   Acute on chronic diastolic CHF.   -Echo noted EF preserved around 55%.  Was treated with IV diuresis, euvolemic at time of discharge    AKI On admission creatinine noted to be 1.2 with BUN 25.  Baseline creatinine previously noted to be 0.88-0.95.  Currently stable will continue to monitor ongoing diuresis.   Prolonged QT interval Acute.  QTc noted to be prolonged at 512.  Replaced electrolytes, Celexa  and mirtazapine held initially, QTc stable on repeat EKG 06/01/2022 at 470 ms,    Essential hypertension On low-dose beta-blocker now on oral Cardizem  for better heart rate control, Norvasc  has been changed, continue with home dose telmisartan    Hypomagnesemia. Hypokalemia  -replaced    Paroxysmal atrial fibrillation on chronic anticoagulation Went in RVR, initially required Cardizem  drip, transitioned to short acting Cardizem , she remained with intermittent A-fib with RVR during hospital stay, so Cardizem  regimen will be added on discharge, she will be discharged on Cardizem  CD. -Continue with home dose beta-blockers -continue with Eliquis  for anticoagulation -TSH mildly suppressed likely sick euthyroid, free T4 stable, continue Eliquis , stable echo.   History of lung cancer with evidence of lung  nodules on CT scan. Patient status post radiation treatments. -Patient was instructed to follow with her oncology team and pulmonary team as an outpatient, and she was counseled at length to stop smoking   Left hip pain secondary to fall Prior to arrival patient reported missing a step and falling landing on her left hip.  On physical exam significant bruising around the left hip.  X-ray of the pelvis negative, pain has improved, PT and monitor.   Hyperlipidemia -Continue with home statin   GERD -Pharmacy substitution of Protonix    Tobacco abuse She was counseled at length, and determined to stop smoking yet    Controlled diabetes mellitus type 2, without long-term use  of insulin  CBG were elevated during hospital stay, but overall her A1c is acceptable at 6, so I will hold on initiating any hypoglycemic medications as she will be on short steroid taper and I would anticipate her CBG to be back to normal after stopping steroids.   Discharge Diagnoses:  Principal Problem:   Sepsis (HCC)   Urinary tract infection   CAP (community acquired pneumonia)   COPD exacerbation (HCC)   Acute respiratory failure with hypoxia (HCC)   Renal insufficiency   Prolonged QT interval   Essential hypertension   History of lung cancer   DM type 2 (diabetes mellitus, type 2) (HCC)   Left hip pain   Fall at home, initial encounter   GERD (gastroesophageal reflux disease)   Cigarette smoker       08/29/2023  f/u ov/[place] office/Isaul Landi re: GOLD 3 / 02 prn  maint on BREO  did not bring all meds/ still smoking  Chief Complaint  Patient presents with   COPD  Dyspnea:  back again walking at [employer] with [facility] parking s 02  Cough: minimal esp in am and hs mostly clear  Sleeping: flat bed 2 pillows s  resp cc  SABA use: neb twice daily  02: 2lpm hs but not every night  /all nigh Lung cancer screening:  Dr MARLA  Rec Plan A = Automatic = Always=    BRE0 100 one click each am  Plan B = Backup (to  supplement plan A, not to replace it) Only use your albuterol  inhaler as a rescue medication  Plan C = Crisis (instead of Plan B but only if Plan B stops working) - only use your albuterol  nebulizer if you first try Plan B   The key is to stop smoking completely before smoking completely stops you!      11/27/2023  f/u ov/[place] office/Karin Pinedo re: GOLD 3 / 02 prn   maint on Trelegy but not affordable  Chief Complaint  Patient presents with   COPD    Coughing w clear mucus Shob worse then usual   Dyspnea:  walking at [employer] s 02  Cough: better / min clear mucus  Sleeping: bed is flat 2 pillows s    resp cc  SABA use: about twic duone  02: 2lpm @ hs / not consistent with it/ no using daytime Lung cancer screening: follow by oncology   No obvious day to day or daytime variability or assoc   purulent sputum or mucus plugs or hemoptysis or cp or chest tightness, subjective wheeze or overt   hb symptoms.    Also denies any obvious fluctuation of symptoms with weather or environmental changes or other aggravating or alleviating factors except as outlined above   No unusual exposure hx or h/o childhood pna/ asthma or knowledge of premature birth.  Current Allergies, Complete Past Medical History, Past Surgical History, Family History, and Social History were reviewed in [employer] record.  ROS  The following are not active complaints unless bolded Hoarseness, sore throat, dysphagia, dental problems, itching, sneezing,  nasal congestion or discharge of excess mucus or purulent secretions, ear ache,   fever, chills, sweats, unintended wt loss or wt gain, classically pleuritic or exertional cp,  orthopnea pnd or arm/hand swelling  or leg swelling, presyncope, palpitations, abdominal pain, anorexia, nausea, vomiting, diarrhea  or change in bowel habits or change in bladder habits, change in stools or change in urine, dysuria, hematuria,  rash, arthralgias, visual  complaints, headache, numbness, weakness or ataxia or problems with walking or coordination,  change in mood or  memory.        Current Meds  Medication Sig   albuterol  (PROVENTIL  HFA;VENTOLIN  HFA) 108 (90 Base) MCG/ACT inhaler Inhale 2 puffs into the lungs every 4 (four) hours as needed for wheezing or shortness of breath (cough, shortness of breath or wheezing.).   amLODipine  (NORVASC ) 5 MG tablet Take 5 mg by mouth.   apixaban  (ELIQUIS ) 5 MG TABS tablet Take 1 tablet (5 mg total) by mouth 2 (two) times daily.   bisoprolol  (ZEBETA ) 5 MG tablet Take 1 tablet (5 mg total) by mouth daily.   citalopram  (CELEXA ) 20 MG tablet Take 1 tablet (20 mg total) by mouth daily.        gabapentin  (NEURONTIN ) 300 MG capsule Take 300 mg by mouth daily.   hydrochlorothiazide  (HYDRODIURIL ) 25 MG tablet Take 25 mg by mouth daily.   ipratropium-albuterol  (DUONEB) 0.5-2.5 (3) MG/3ML SOLN Take 3 mLs by nebulization every 6 (six) hours as needed (wheezing, shortness of breath).   lovastatin (MEVACOR) 40 MG tablet Take 40 mg by mouth daily.   mirtazapine (REMERON) 15 MG tablet Take 15 mg by mouth at bedtime.   olmesartan  (BENICAR ) 40 MG tablet Take 1 tablet (40 mg total) by mouth daily.   omeprazole (PRILOSEC OTC) 20 MG tablet Take 20 mg by mouth daily.   oxybutynin  (DITROPAN ) 5 MG tablet Take 5 mg by mouth 2 (two) times daily.   potassium chloride  SA (KLOR-CON  M) 20 MEQ tablet TAKE ONE TABLET BY MOUTH ONCE DAILY   TRELEGY ELLIPTA 100-62.5-25 MCG/ACT AEPB Inhale 1 puff into the lungs daily.             Past Medical History:  Diagnosis Date   Arthritis    COPD (chronic obstructive pulmonary disease) (HCC)    Depression    Dyspnea    Essential hypertension, benign    Type 2 diabetes mellitus (HCC)    Urinary incontinence        Objective:    Wts  11/27/2023         144  08/29/2023         144  03/18/2023       141   02/11/2023       140  12/31/2022        136   08/21/2022        143  05/16/22 135  lb 12.8 oz (61.6 kg)  05/14/22 134 lb (60.8 kg)  04/05/22 134 lb 6.4 oz (61 kg)   Vital signs reviewed  11/27/2023  - Note at rest 02 sats  97% on RA   General appearance:    amb bf nad    HEENT :  Oropharynx  clear   Nasal turbinates nl    NECK :  without JVD/Nodes/TM/ nl carotid upstrokes bilaterally   LUNGS: no acc muscle use,  Mod barrel  contour chest wall with bilateral  Distant bs s audible wheeze and  without cough on insp or exp maneuvers and mod  Hyperresonant  to  percussion bilaterally     CV:  RRR  no s3 or murmur or increase in P2, and no edema   ABD:  soft and nontender with pos mid insp Hoover's  in the supine position. No bruits or organomegaly appreciated, bowel sounds nl  MS:   Ext warm without deformities or   obvious joint restrictions , calf tenderness, cyanosis or clubbing  SKIN: warm and dry without lesions    NEURO:  alert, approp, nl sensorium with  no motor or cerebellar deficits apparent.                   Assessment   Assessment & Plan COPD GOLD 3/ group E  still smoking Active smoker - PFT's  02/16/2019  FEV1 0.87 (39 % ) ratio 0.39  p 7 % improvement from saba p ? prior to study with DLCO  10.88 (49%)   and FV curve classically concave  - 04/05/2022  After extensive coaching inhaler device,  effectiveness =    75% with dpi > trelegy trial  - d/c ACE 04/05/2022  - 04/05/2022   Walked on RA  x  3  lap(s) =  approx 450  ft  @ mod pace, stopped due to end of study with lowest 02 sats 95% no sob   - PFT's  06/03/22  FEV1 0.95 (38 % ) ratio 0.54  p 0 % improvement from saba p 0 prior to study with DLCO  10.31 (49%)   and FV curve mildly concave/somewhat atyical and ERV 6% at wt 144   - 02/11/2023  After extensive coaching inhaler device,  effectiveness =    75% with dpi, 25% with hfa  - 02/11/2023 try pred 20 until better then 10 mg daily x 5 d and off>>> much better  - 02/11/2023    d/c acei  - 11/27/2023 new ABC with A= duoneb/bud 0.25 up to qid and C =  prednisone   20 until better then 10 mg daily x 5 d and off- see avs for instructions unique to this ov    Group D (now reclassified as E) in terms of symptom/risk and laba/lama/ICS  therefore appropriate rx at this point >>>  trelegy but can't afford it so best option for now is duoneb/ bud 0.25 and back it up with prednisone  as above    Nocturnal hypoxemia As of 08/29/2023 on 2lpm hs  - 08/29/2023   Walked on RA  x  2  lap(s) =  approx 300  ft  @ slow pace, stopped due to sob  with lowest 02 sats 97%    For now continue 2lpm hs only          Each maintenance medication was reviewed in detail including emphasizing most importantly the difference between maintenance and prns and under what circumstances the prns are to be triggered using an action plan format where appropriate.  Total time for H and P, chart review, counseling, reviewing neb/ hfa/02 device(s) and generating customized AVS unique to this office visit / same day charting = 22 min         Cigarette smoker 4-5 min discussion re active cigarette smoking in addition to office E&M  Ask about tobacco use:   ongoing  Advise quitting   I took an extended  opportunity with this patient to outline the consequences of continued cigarette use  in airway disorders based on all the data we have from the multiple national lung health studies (perfomed over decades at millions of dollars in cost)  indicating that smoking cessation, not choice of inhalers or pulmonary physicians, is the most important aspect of her  care.   Assess willingness:  Not committed at this point Assist in quit attempt:  Per PCP when ready Arrange follow up:   Follow up per Primary Care planned  AVS  Patient Instructions  Plan A = Automatic = Always=    Change   duoneb to 2-4 x daily with budesonide  0.25 with each treatment   Plan B = Backup (to supplement plan A, not to replace it) Use your albuterol  inhaler as a rescue medication to be used if you  can't catch your breath by resting or slowing your pace  or doing a relaxed purse lip breathing pattern.  - The less you use it, the better it will work when you need it. - Ok to use the inhaler up to 2 puffs  every 4 hours if you must but call for appointment if use goes up over your usual need - Don't leave home without it !!  (think of it like the spare tire or starter fluid for your car)    Plan C = Crisis (instead of Plan B but only if Plan B stops working) -Prednsione 10 mg x 2 each am until better then 1 daily x 5 days and stop (refillable)    Please schedule a follow up visit in 6 months but call sooner if needed    Ozell America, MD 11/30/2023

## 2023-11-27 NOTE — Patient Instructions (Addendum)
 Plan A = Automatic = Always=    Change   duoneb to 2-4 x daily with budesonide  0.25 with each treatment   Plan B = Backup (to supplement plan A, not to replace it) Use your albuterol  inhaler as a rescue medication to be used if you can't catch your breath by resting or slowing your pace  or doing a relaxed purse lip breathing pattern.  - The less you use it, the better it will work when you need it. - Ok to use the inhaler up to 2 puffs  every 4 hours if you must but call for appointment if use goes up over your usual need - Don't leave home without it !!  (think of it like the spare tire or starter fluid for your car)    Plan C = Crisis (instead of Plan B but only if Plan B stops working) -Prednsione 10 mg x 2 each am until better then 1 daily x 5 days and stop (refillable)    Please schedule a follow up visit in 6 months but call sooner if needed

## 2023-11-27 NOTE — Assessment & Plan Note (Addendum)

## 2023-11-30 NOTE — Assessment & Plan Note (Addendum)
 As of 08/29/2023 on 2lpm hs  - 08/29/2023   Walked on RA  x  2  lap(s) =  approx 300  ft  @ slow pace, stopped due to sob  with lowest 02 sats 97%    For now continue 2lpm hs only          Each maintenance medication was reviewed in detail including emphasizing most importantly the difference between maintenance and prns and under what circumstances the prns are to be triggered using an action plan format where appropriate.  Total time for H and P, chart review, counseling, reviewing neb/ hfa/02 device(s) and generating customized AVS unique to this office visit / same day charting = 22 min

## 2023-11-30 NOTE — Assessment & Plan Note (Addendum)
 Active smoker - PFT's  02/16/2019  FEV1 0.87 (39 % ) ratio 0.39  p 7 % improvement from saba p ? prior to study with DLCO  10.88 (49%)   and FV curve classically concave  - 04/05/2022  After extensive coaching inhaler device,  effectiveness =    75% with dpi > trelegy trial  - d/c ACE 04/05/2022  - 04/05/2022   Walked on RA  x  3  lap(s) =  approx 450  ft  @ mod pace, stopped due to end of study with lowest 02 sats 95% no sob   - PFT's  06/03/22  FEV1 0.95 (38 % ) ratio 0.54  p 0 % improvement from saba p 0 prior to study with DLCO  10.31 (49%)   and FV curve mildly concave/somewhat atyical and ERV 6% at wt 144   - 02/11/2023  After extensive coaching inhaler device,  effectiveness =    75% with dpi, 25% with hfa  - 02/11/2023 try pred 20 until better then 10 mg daily x 5 d and off>>> much better  - 02/11/2023    d/c acei  - 11/27/2023 new ABC with A= duoneb/bud 0.25 up to qid and C = prednisone   20 until better then 10 mg daily x 5 d and off- see avs for instructions unique to this ov    Group D (now reclassified as E) in terms of symptom/risk and laba/lama/ICS  therefore appropriate rx at this point >>>  trelegy but can't afford it so best option for now is duoneb/ bud 0.25 and back it up with prednisone  as above

## 2024-01-12 ENCOUNTER — Ambulatory Visit (INDEPENDENT_AMBULATORY_CARE_PROVIDER_SITE_OTHER): Admitting: Internal Medicine

## 2024-01-12 ENCOUNTER — Encounter: Payer: Self-pay | Admitting: Internal Medicine

## 2024-01-12 VITALS — BP 106/69 | HR 92 | Ht 67.0 in | Wt 157.0 lb

## 2024-01-12 DIAGNOSIS — G4734 Idiopathic sleep related nonobstructive alveolar hypoventilation: Secondary | ICD-10-CM

## 2024-01-12 DIAGNOSIS — R918 Other nonspecific abnormal finding of lung field: Secondary | ICD-10-CM

## 2024-01-12 DIAGNOSIS — F17210 Nicotine dependence, cigarettes, uncomplicated: Secondary | ICD-10-CM

## 2024-01-12 DIAGNOSIS — J449 Chronic obstructive pulmonary disease, unspecified: Secondary | ICD-10-CM

## 2024-01-12 MED ORDER — PREDNISONE 10 MG PO TABS: ORAL_TABLET | ORAL | 0 refills | Status: DC

## 2024-01-12 NOTE — Assessment & Plan Note (Addendum)
 Counseled re importance of smoking cessation but did not meet time criteria for separate billing    Each maintenance medication was reviewed in detail including emphasizing most importantly the difference between maintenance and prns and under what circumstances the prns are to be triggered using an action plan format where appropriate.  Total time for H and P, chart review, counseling, reviewing hfa/neb/ dpi/ 02/ pulse ox  device(s) , directly observing portions of ambulatory 02 saturation study/ and generating customized AVS unique to this office visit / same day charting = 42 min

## 2024-01-12 NOTE — Assessment & Plan Note (Addendum)
 See CT chest 02/07/22  -    mucus plugging and bronchiectasis, esp LLL with baseline CT 01/20/19 and interval progression exp LUL with solid component  - CT chest 05/07/2022  1. Enlarging spiculated nodule in the LEFT upper lobe concerning for bronchogenic carcinoma. Recommend tissue sampling and consider FDG PET scan to evaluate extent of disease. 2. Improvement peribronchial thickening and bronchiectasis in the RIGHT lower lobe. - PET 05/16/2022  Pos @  11 mm no pos nodes > rec consider excisional bx vs RULobectomy vs FOB with SRT > SBRT completed 09/02/22   >>>  Referred back to oncology for F/u 01/12/2024    If released from oncology f/u should resumed LCS > advised

## 2024-01-12 NOTE — Assessment & Plan Note (Addendum)
 As of 08/29/2023 on 2lpm hs  - 08/29/2023   Walked on RA  x  2  lap(s) =  approx 300  ft  @ slow pace, stopped due to sob  with lowest 02 sats 97%   - 01/12/2024   Walked on RA  x  2  lap(s) =  approx 300  ft  @ slow  pace, stopped due to knee pain  with lowest 02 sats  89%   Rec continue 2lpm conc hs and prn daytime for any activity resulting in sats less than 90%

## 2024-01-12 NOTE — Patient Instructions (Addendum)
 My office will be contacting you by phone for referral to oncology at [facility]   - if you don't hear back from my office within one week please call us  back or notify us  thru MyChart and we'll address it right away.    Plan A = Automatic = Always=    Trelegy one click each am   Plan B = Backup (to supplement plan A, not to replace it) Use your albuterol  inhaler as a rescue medication to be used if you can't catch your breath by resting or slowing your pace  or doing a relaxed purse lip breathing pattern.  - The less you use it, the better it will work when you need it. - Ok to use the inhaler up to 2 puffs  every 4 hours if you must but call for appointment if use goes up over your usual need - Don't leave home without it !!  (think of it like the spare tire or starter fluid for your car)   Plan C  -  if A and B not working add nebulizer with duoneb  plus budesonide  0.25 mg up to every 4 hours if needed  Plan D  if ABC not working : add   prednisone  10 m x 2 daily until better then one daily for 5 days and stop   Please schedule a follow up visit in 3 months but call sooner if needed  with all RESPIRATORY medications /inhalers/ solutions in hand so we can verify exactly what you are taking. This includes all medications from all doctors and over the counters

## 2024-01-12 NOTE — Progress Notes (Signed)
 Holly May, female    DOB: 02/13/1951    MRN: [id number]   Brief patient profile:  73yobf  active smoker  referred to pulmonary clinic in [place]  04/05/2022 by Holly May  for SPN in setting  variable doe attributed to  Copd / AB  x  2017    S/p thymomectomy by Holly May 2020 s adjuctive rx with preop pfts c/w GOLD 3 copd    History of Present Illness  04/05/2022  Pulmonary/ 1st office eval/ Holly May / [facility] May Office on acei and DPI  Chief Complaint  Patient presents with   Consult    SOB   Dyspnea:  mb and back 25ft  each way level and so short of breath  sometimes has to stop but varies s pattern  Cough: clear mucus variably producttive esp p lie down sometimes wakes her up p asleep/ flat 2 pillows  SABA use: 3 x weekly  02: none Rec We will walk you today to see if you qualify for 0xygen  Start Trelegy 100 one click each For cough / congestion > mucinex  1200 mg every 12 hours as needed  Levaquin  500 mg one daily x 10 days  CT chest needs to be done after Feb 3rd 2024  The key is to stop smoking completely before smoking completely stops you! 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 > SBRT  /turned down for resection by Holly May   08/21/2022  f/u ov/Holly May office/Holly May re: GOLD 3 copd  maint on trelegy  Chief Complaint  Patient presents with   Follow-up    Pt f/u states that shse is doing well she begins her cancer treatment next week, she does believe that her breathing is getting better  Dyspnea:  [employer] walking ok Cough: minimal / thinks she's no longer on acei  Sleeping: flat bed 3 pillows baseline  SABA use: 3 x weekly / neb rarely  02: none  Rec Call us  back with the spelling of the new blood pressure pill  Plan A = Automatic = Always=   Trelegy 100 one puff each am   Plan B = Backup (to supplement  plan A, not to replace it) Only use your albuterol  inhaler as a rescue  Plan C = Crisis (instead of Plan B but only if Plan B stops working) - only use your albuterol  nebulizer if you first try Plan  Please schedule a follow up visit in 3 months but call sooner if needed  with all medications /inhalers/ solutions in hand    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   02/11/2023  f/u ov/[place] office/Holly May re: GOLD 3  maint on trelegy 100  did  bring meds (includes lisinopril  40 and fosamax)  Chief Complaint  Patient presents with   COPD GOLD 3/ group E still smoking   Dyspnea:  ? A little better, still doing [employer]/ uses HC  Cough: worse since last ov esp when lie down > min mucoid Sleeping: flat bed/ 2 pillows s    resp cc  SABA use: q 2-3 days  02: none  Rec Stop lisinopril   Plan A = Automatic = Always=  Trelegy 100 one click each AM   - take your time! Work  on inhaler technique:  Plan B = Backup (to supplement plan A, not to replace it) Only use your albuterol  inhaler as a rescue medication  Plan C = Crisis (instead of Plan B but only if Plan B stops working) - only use your albuterol  nebulizer if you first try Plan B  Stop lisinopril  and replace with benicar  (olemsartan)  40 mg one daily   Prednisone  10 mg  2 daily until better and one daily for a week and stop     Admit date: 05/30/2023 Discharge date: 06/06/2023  73 y.o. female with medical history significant of hypertension, paroxysmal atrial fibrillation, PAF, severe COPD, diabetes mellitus type 2, lung adenocarcinoma on radiation therapy, tobacco abuse presents with worsening cough and chills.  Holly May with sepsis due to combination of UTI and community-acquired pneumonia, COPD exacerbation, acute on chronic diastolic CHF along with atrial fibrillation with RVR.    Sepsis, present on admission secondary to UTI and community-acquired pneumonia -She has been  on admission, resolved at time of discharge, she was treated appropriately with IV antibiotics, x-ray significant with opacity at the right lung base, treated with appropriate antibiotic during hospital stay, no further antibiotic needed at time of discharge     Acute respiratory failure with hypoxia, due to community-acquired pneumonia, COP exacerbation and acute on chronic diastolic CHF -She is with hypoxia upon presentation, increased work of breathing, this has much improved, no oxygen  requirement at time of discharge . -She was treated with scheduled nebs treatment, as needed albuterol , and IV steroids, respiratory status back to baseline, she will be discharged on prednisone  taper .   Acute on chronic diastolic CHF.   -Echo noted EF preserved around 55%.  Was treated with IV diuresis, euvolemic at time of discharge    AKI On admission creatinine noted to be 1.2 with BUN 25.  Baseline creatinine previously noted to be 0.88-0.95.  Currently stable will continue to monitor ongoing diuresis.   Prolonged QT interval Acute.  QTc noted to be prolonged at 512.  Replaced electrolytes, Celexa  and mirtazapine held initially, QTc stable on repeat EKG 06/01/2022 at 470 ms,    Essential hypertension On low-dose beta-blocker now on oral Cardizem  for better heart rate control, Norvasc  has been changed, continue with home dose telmisartan    Hypomagnesemia. Hypokalemia  -replaced    Paroxysmal atrial fibrillation on chronic anticoagulation Went in RVR, initially required Cardizem  drip, transitioned to short acting Cardizem , she remained with intermittent A-fib with RVR during hospital stay, so Cardizem  regimen will be added on discharge, she will be discharged on Cardizem  CD. -Continue with home dose beta-blockers -continue with Eliquis  for anticoagulation -TSH mildly suppressed likely sick euthyroid, free T4 stable, continue Eliquis , stable echo.   History of lung cancer with evidence of lung  nodules on CT scan. Patient status post radiation treatments. -Patient was instructed to follow with her oncology team and pulmonary team as an outpatient, and she was counseled at length to stop smoking   Left hip pain secondary to fall Prior to arrival patient reported missing a step and falling landing on her left hip.  On physical exam significant bruising around the left hip.  X-ray of the pelvis negative, pain has improved, PT and monitor.   Hyperlipidemia -Continue with home statin   GERD -Pharmacy substitution of Protonix    Tobacco abuse She was counseled at length, and determined to stop smoking yet    Controlled diabetes mellitus type 2, without long-term use of insulin  CBG were elevated  during hospital stay, but overall her A1c is acceptable at 6, so I will hold on initiating any hypoglycemic medications as she will be on short steroid taper and I would anticipate her CBG to be back to normal after stopping steroids.   Discharge Diagnoses:  Principal Problem:   Sepsis (HCC)   Urinary tract infection   CAP (community acquired pneumonia)   COPD exacerbation (HCC)   Acute respiratory failure with hypoxia (HCC)   Renal insufficiency   Prolonged QT interval   Essential hypertension   History of lung cancer   DM type 2 (diabetes mellitus, type 2) (HCC)   Left hip pain   Fall at home, initial encounter   GERD (gastroesophageal reflux disease)   Cigarette smoker       08/29/2023  f/u ov/[place] office/Sandrina Heaton re: GOLD 3 / 02 prn  maint on BREO  did not bring all meds/ still smoking  Chief Complaint  Patient presents with   COPD  Dyspnea:  back again walking at [employer] with [facility] [employer] parking s 02  Cough: minimal esp in am and hs mostly clear  Sleeping: flat bed 2 pillows s  resp cc  SABA use: neb twice daily  02: 2lpm hs but not every night  /all nigh Lung cancer screening:  Dr MARLA  Rec Plan A = Automatic = Always=    BRE0 100 one click each am  Plan B = Backup (to  supplement plan A, not to replace it) Only use your albuterol  inhaler as a rescue medication  Plan C = Crisis (instead of Plan B but only if Plan B stops working) - only use your albuterol  nebulizer if you first try Plan B   The key is to stop smoking completely before smoking completely stops you!   Admit Date: 11/03/2023  Discharge Date: 11/07/2023    Discharge Diagnoses:  Principal Problem (Resolved):   Atrial fibrillation with RVR    (POA: Unknown)   Alcohol intoxication (POA: Yes)   COPD (chronic obstructive pulmonary disease)    (POA: Unknown)   Chronic hypoxic respiratory failure    (POA: Unknown)   Hypertension (POA: Unknown)   Tobacco use disorder (POA: Yes) Hospital Course:    Zofia Peckinpaugh is a 73 y.o. female whose presentation is complicated by alcohol use disorder, COPD, HTN, T2DM, depression, adenocarcinoma of LUL, diastolic heart failure, and paroxysmal afib who presented to [facility] for Afib with RVR and acute alcohol intoxication.    Atrial Fibrillation w/ RVR EKG initially showing AFib with rates in the 140s.  She does have a history of paroxysmal atrial fibrillation for which she takes bisoprolol  and Eliquis . Rate controlled with increased metoprolol  doses while inpatient. Suspect this is secondary to acute alcohol intoxication. Discharged on home Eliquis  5 mg BID and increased bisoprolol  to 10 mg daily.    Chronic hypoxic respiratory failure  COPD Patient is on Trelegy at home. She occasionally uses 2L O2 at home with activity but normally does not need at rest. She states her current cough/dyspnea is consistent with baseline. Given lack of increased cough or sputum changes, low concern for acute COPD exacerbation. Continue home Trelegy and supplemental O2 as needed.    BLE edema R>L New asymmetric lower extremity pitting edema. Patient denies leg pain/tenderness. BLE PVL 8/5 with no evidence of DVT in RLE or LLE.    Acute Alcohol Intoxication  Alcohol use  disorder  Blood EtOH  on admission 297. Reports no history of complicated withdrawal. Pt  previously was able to stop drinking for 9 months. She is not interested in inpatient rehab but is interested in cessation. She did not require any CIWA PRNs while admitted. Discharged on naltrexone 50 mg daily. Recommended AA meetings.    Transaminitis (improved) ALT 49, AST 161, Alk Phos 163 on admission. Patient has no known history of liver disease/cirrhosis or biliary disease. No RUQ pain/tenderness. Pattern is most consistent with alcohol related liver injury. LFTs down trended during admission.    Chronic Problems   CAD s/p CABG: Continue home lovastatin 40 mg daily Hypertension: Continue home amlodipine  5 mg, hydrochlorothiazide  25 mg daily, and olmesartan  40 mg daily Abdominal aortic fusiform dilatation: Incidental finding on CTA PE of a fusiform dilatation of the suprarenal abdominal aorta measuring up to 4.1 cm. She is not having any chest or abdominal pain. 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 F/up abdominal US  in 1 yr for surveillance per Society of Vascular Surgery guidelines. Follow up MRI for renal lesion in 3 months.  Type II Diabetes Mellitus: Not currently on any pharmacotherapies. Received minimal SSI while inpatient.   Tobacco Use Disorder: Encouraged tobacco cessation. Nicotine  patches and gum PRN while inpatient. Depression: Continue home citalopram  20 mg daily and mirtazapine 15 mg at bedtime. Osteoporosis: Continue home Fosamax 70 mg weekly. OAB: Continue home oxybutynin  5 mg TID. Pain: Continue home gabapentin  300 mg at bedtime.     11/27/2023  f/u ov/[place] office/Shatiqua Heroux re: GOLD 3 / 02 prn   maint on Trelegy but not affordable  Chief Complaint  Patient presents with   COPD    Coughing w clear mucus Shob worse then usual   Dyspnea:  walking at [employer] s 02  Cough: better / min clear mucus   Sleeping: bed is flat 2 pillows s    resp cc  SABA use: about twic duone  02: 2lpm @ hs / not consistent with it/ no using daytime Lung cancer screening: follow by oncology  Rec Plan A = Automatic = Always=    Change   duoneb to 2-4 x daily with budesonide  0.25 with each treatment  Plan B = Backup (to supplement plan A, not to replace it) Use your albuterol  inhaler as a rescue medication  Plan C = Crisis (instead of Plan B but only if Plan B stops working) Plan D  if ABC not  working add Prednisone  10 mg x 2 each am until better then 1 daily x 5 days and stop (refillable) Please schedule a follow up visit in 6 months but call sooner if needed    01/12/2024  f/u ov/[place] office/Lacosta Hargan re: GOLD 3 copd  maint on trelegy 100 and back up neb but not using / following ABCD plan above Chief Complaint  Patient presents with   COPD    Abn ct from [facility]  Dyspnea:  very hard to understand   - very sedentary, thinks she needs amb 02  Cough: none  Sleeping: bed is flat / 2 pillows s  resp cc  SABA use: 2 x daily  02: concentrator on 2lpm hs  and  as needed during the day   No obvious day to day or daytime variability or assoc excess/ purulent sputum or mucus plugs or hemoptysis or cp or chest tightness, subjective wheeze or overt sinus or hb symptoms.    Also denies any obvious fluctuation of symptoms with weather or environmental changes or other  aggravating or alleviating factors except as outlined above   No unusual exposure hx or h/o childhood pna/ asthma or knowledge of premature birth.  Current Allergies, Complete Past Medical History, Past Surgical History, Family History, and Social History were reviewed in [employer] record.  ROS  The following are not active complaints unless bolded Hoarseness, sore throat, dysphagia, dental problems, itching, sneezing,  nasal congestion or discharge of excess mucus or purulent secretions, ear ache,   fever, chills, sweats,  unintended wt loss or wt gain, classically pleuritic or exertional cp,  orthopnea pnd or arm/hand swelling  or leg swelling R>L leg , presyncope, palpitations, abdominal pain, anorexia, nausea, vomiting, diarrhea  or change in bowel habits or change in bladder habits, change in stools or change in urine, dysuria, hematuria,  rash, arthralgias, visual complaints, headache, numbness, weakness or ataxia or problems with walking or coordination,  change in mood or  memory.        Current Meds  Medication Sig   albuterol  (PROVENTIL  HFA;VENTOLIN  HFA) 108 (90 Base) MCG/ACT inhaler Inhale 2 puffs into the lungs every 4 (four) hours as needed for wheezing or shortness of breath (cough, shortness of breath or wheezing.).   amLODipine  (NORVASC ) 5 MG tablet Take 5 mg by mouth.   apixaban  (ELIQUIS ) 5 MG TABS tablet Take 1 tablet (5 mg total) by mouth 2 (two) times daily.   bisoprolol  (ZEBETA ) 5 MG tablet Take 1 tablet (5 mg total) by mouth daily.   budesonide  (PULMICORT ) 0.25 MG/2ML nebulizer solution One vial with each duoneb up to 4 x daily   gabapentin  (NEURONTIN ) 300 MG capsule Take 300 mg by mouth daily.   ipratropium-albuterol  (DUONEB) 0.5-2.5 (3) MG/3ML SOLN Take 3 mLs by nebulization every 6 (six) hours as needed (wheezing, shortness of breath).   lovastatin (MEVACOR) 40 MG tablet Take 40 mg by mouth daily.   mirtazapine (REMERON) 15 MG tablet Take 15 mg by mouth at bedtime.   olmesartan  (BENICAR ) 40 MG tablet Take 1 tablet (40 mg total) by mouth daily.   omeprazole (PRILOSEC OTC) 20 MG tablet Take 20 mg by mouth daily.   oxybutynin  (DITROPAN ) 5 MG tablet Take 5 mg by mouth 2 (two) times daily.   potassium chloride  SA (KLOR-CON  M) 20 MEQ tablet TAKE ONE TABLET BY MOUTH ONCE DAILY   predniSONE  (DELTASONE ) 10 MG tablet red 20 until better then 10 mg daily x 5 d and off   TRELEGY ELLIPTA 100-62.5-25 MCG/ACT AEPB Inhale 1 puff into the lungs daily.          Past Medical History:  Diagnosis Date    Arthritis    COPD (chronic obstructive pulmonary disease) (HCC)    Depression    Dyspnea    Essential hypertension, benign    Type 2 diabetes mellitus (HCC)    Urinary incontinence        Objective:    Wts  01/12/2024       157  11/27/2023         144  08/29/2023         144  03/18/2023       141   02/11/2023       140  12/31/2022        136   08/21/2022        143  05/16/22 135 lb 12.8 oz (61.6 kg)  05/14/22 134 lb (60.8 kg)  04/05/22 134 lb 6.4 oz (61 kg)    Vital signs reviewed  01/12/2024  - Note at rest 02 sats  100% on  RA    General appearance:    amb wf very difficult to understand, talking rapidly and garbled     HEENT :  Oropharynx  clear   Nasal turbinates nl    NECK :  without JVD/Nodes/TM/ nl carotid upstrokes bilaterally   LUNGS: no acc muscle use,  Mod barrel  contour chest wall with bilateral  Distant bs s audible wheeze and  without cough on insp or exp maneuvers and mod  Hyperresonant  to  percussion bilaterally     CV:  RRR  no s3 or murmur or increase in P2, and 1+ pitting RLE/ trace on L   ABD:  soft and nontender with pos mid insp Hoover's  in the supine position. No bruits or organomegaly appreciated, bowel sounds nl  MS:   Ext warm without deformities or   obvious joint restrictions , calf tenderness, cyanosis or clubbing  SKIN: warm and dry without lesions    NEURO:  alert, approp, nl sensorium with  no motor or cerebellar deficits apparent.                   Assessment     Assessment & Plan Multiple pulmonary nodules determined by computed tomography of lung See CT chest 02/07/22  -    mucus plugging and bronchiectasis, esp LLL with baseline CT 01/20/19 and interval progression exp LUL with solid component  - CT chest 05/07/2022  1. Enlarging spiculated nodule in the LEFT upper lobe concerning for bronchogenic carcinoma. Recommend tissue sampling and consider FDG PET scan to evaluate extent of disease. 2. Improvement peribronchial  thickening and bronchiectasis in the RIGHT lower lobe. - PET 05/16/2022  Pos @  11 mm no pos nodes > rec consider excisional bx vs RULobectomy vs FOB with SRT > SBRT completed 09/02/22   >>>  Referred back to oncology for F/u 01/12/2024    If released from oncology f/u should resumed LCS > advised    COPD GOLD 3/ group E  still smoking Active smoker - PFT's  02/16/2019  FEV1 0.87 (39 % ) ratio 0.39  p 7 % improvement from saba p ? prior to study with DLCO  10.88 (49%)   and FV curve classically concave  - 04/05/2022  After extensive coaching inhaler device,  effectiveness =    75% with dpi > trelegy trial  - d/c ACE 04/05/2022  - 04/05/2022   Walked on RA  x  3  lap(s) =  approx 450  ft  @ mod pace, stopped due to end of study with lowest 02 sats 95% no sob   - PFT's  06/03/22  FEV1 0.95 (38 % ) ratio 0.54  p 0 % improvement from saba p 0 prior to study with DLCO  10.31 (49%)   and FV curve mildly concave/somewhat atyical and ERV 6% at wt 144   - 02/11/2023  After extensive coaching inhaler device,  effectiveness =    75% with dpi, 25% with hfa  - 02/11/2023 try pred 20 until better then 10 mg daily x 5 d and off>>> much better  - 02/11/2023    d/c acei  - 11/27/2023 new ABC with A= duoneb/bud 0.25 and C = prednisone   20 until better then 10 mg daily x 5 d and off  - 01/12/2024  After extensive coaching inhaler device,  effectiveness =    50% with hfa and dpi so  continue duoneb/bud 0.25 mg up to 4 x daily as plan D  -  Group E in terms of symptoms/risk so  laba/lama/ICS  therefore appropriate rx at this point >>>  trelogy 100 with improved techniequ  and approp SABA prn as above      Nocturnal hypoxemia As of 08/29/2023 on 2lpm hs  - 08/29/2023   Walked on RA  x  2  lap(s) =  approx 300  ft  @ slow pace, stopped due to sob  with lowest 02 sats 97%   - 01/12/2024   Walked on RA  x  2  lap(s) =  approx 300  ft  @ slow  pace, stopped due to knee pain  with lowest 02 sats  89%   Rec continue 2lpm  conc hs and prn daytime for any activity resulting in sats less than 90%   Cigarette smoker Counseled re importance of smoking cessation but did not meet time criteria for separate billing    Each maintenance medication was reviewed in detail including emphasizing most importantly the difference between maintenance and prns and under what circumstances the prns are to be triggered using an action plan format where appropriate.  Total time for H and P, chart review, counseling, reviewing hfa/neb/ dpi/ 02/ pulse ox  device(s) , directly observing portions of ambulatory 02 saturation study/ and generating customized AVS unique to this office visit / same day charting = 42 min           AVS  Patient Instructions  My office will be contacting you by phone for referral to oncology at [facility]   - if you don't hear back from my office within one week please call us  back or notify us  thru MyChart and we'll address it right away.    Plan A = Automatic = Always=    Trelegy one click each am   Plan B = Backup (to supplement plan A, not to replace it) Use your albuterol  inhaler as a rescue medication to be used if you can't catch your breath by resting or slowing your pace  or doing a relaxed purse lip breathing pattern.  - The less you use it, the better it will work when you need it. - Ok to use the inhaler up to 2 puffs  every 4 hours if you must but call for appointment if use goes up over your usual need - Don't leave home without it !!  (think of it like the spare tire or starter fluid for your car)   Plan C  -  if A and B not working add nebulizer with duoneb  plus budesonide  0.25 mg up to every 4 hours if needed  Plan D  if ABC not working : add   prednisone  10 m x 2 daily until better then one daily for 5 days and stop   Please schedule a follow up visit in 3 months but call sooner if needed  with all RESPIRATORY medications /inhalers/ solutions in hand so we can verify exactly what you are  taking. This includes all medications from all doctors and over the counters    Ozell America, MD 01/12/2024

## 2024-01-12 NOTE — Assessment & Plan Note (Addendum)
 Active smoker - PFT's  02/16/2019  FEV1 0.87 (39 % ) ratio 0.39  p 7 % improvement from saba p ? prior to study with DLCO  10.88 (49%)   and FV curve classically concave  - 04/05/2022  After extensive coaching inhaler device,  effectiveness =    75% with dpi > trelegy trial  - d/c ACE 04/05/2022  - 04/05/2022   Walked on RA  x  3  lap(s) =  approx 450  ft  @ mod pace, stopped due to end of study with lowest 02 sats 95% no sob   - PFT's  06/03/22  FEV1 0.95 (38 % ) ratio 0.54  p 0 % improvement from saba p 0 prior to study with DLCO  10.31 (49%)   and FV curve mildly concave/somewhat atyical and ERV 6% at wt 144   - 02/11/2023  After extensive coaching inhaler device,  effectiveness =    75% with dpi, 25% with hfa  - 02/11/2023 try pred 20 until better then 10 mg daily x 5 d and off>>> much better  - 02/11/2023    d/c acei  - 11/27/2023 new ABC with A= duoneb/bud 0.25 and C = prednisone   20 until better then 10 mg daily x 5 d and off  - 01/12/2024  After extensive coaching inhaler device,  effectiveness =    50% with hfa and dpi so continue duoneb/bud 0.25 mg up to 4 x daily as plan D  -  Group E in terms of symptoms/risk so  laba/lama/ICS  therefore appropriate rx at this point >>>  trelogy 100 with improved techniequ  and approp SABA prn as above

## 2024-01-13 NOTE — Progress Notes (Unsigned)
 [facility] 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 Kerrin and not felt to be surgical candidate - XRT 3 fractions x 18 Gray = 54 Gray from 08/28/2022 through 09/02/2022  CURRENT THERAPY: Surveillance  BRIEF ONCOLOGIC HISTORY:   Oncology History   No history exists.    CANCER STAGING: Cancer Staging  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) Staging form: Lung, AJCC 8th Edition - Clinical stage from 08/20/2022: cT1, cN0, cM0 - Unsigned   INTERVAL HISTORY:   Holly May, a 73 y.o. 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  Holly May was last seen on 01/06/2023 by Dr. Rogers. She was recommended for 20-month follow-up, but was no-show for that visit.  In the interim since last visit, she was hospitalized at [facility] from 11/03/2023 through 11/07/2023 due to atrial fibrillation with RVR in the setting of acute alcohol intoxication.  At today's visit, she  reports feeling ***.  She reports ***% energy and ***% appetite.  She is maintaining stable weight at this time. ***Chest pain, hemoptysis, new onset SOB, voice changes, unintentional weight loss, recurrent lung infections ***Dyspnea is at baseline related to underlying COPD.  ASSESSMENT & PLAN:  1.  Stage I (T1 N0 M0) left upper lobe lung adenocarcinoma - CT chest (02/07/2022): Increase in size and new solid component of spiculated left upper lobe nodule measuring 11 x 8 mm.  Indeterminate 10 x 6 mm right lower lobe nodule. - PET scan (05/16/2022): Hypermetabolic activity in the 11 mm LUL nodule.  No other adenopathy. - Bronchoscopy by Dr. Brenna (07/09/2022) - Pathology of FNA: Adenocarcinoma.  FNA of 11 L lymph  node negative. - Patient seen by Dr. Kerrin and was not felt to be a surgical candidate. - Seen by Dr. Patrcia and plan for 3-5 courses of SBRT.  Also evaluated by Dr. Sherrod. - XRT 3 fractions x 18 Gray= 54 Gray from 08/28/2022 through 09/02/2022 - CT chest (12/30/2022): Stable spiculated left upper lobe nodule with adjacent fiducial marker.  No evidence of local recurrence or metastatic disease.  New superior endplate compression deformity at L2 without definite pathologic features.  Additional thoracic compression deformities are grossly stable. - Most recent CT chest (09/25/2023): Left upper lobe spiculated nodule is similar/slightly smaller from previous, with adjacent fiducial marker.  There is opacity in right lung have improved, but with significant residual opacity along bronchi and right lower lobe (continued follow-up recommended).  No evidence of recurrent or metastatic disease. - Denies any chest pains or hemoptysis.*** - PLAN: Continue close surveillance with CT chest with contrast every 6 months, for total of 5 years.  Next CT scan due December 2025.  We will see her for office visit after CT scan. - *** Smoking cessation counseling and referral *** [facility] GUIDELINES for SURVIVORSHIP & SURVEILLANCE of NON SMALL CELL LUNG CANCER after completion of definitive therapy (as of September 2025): - STAGE I-II (primary treatment included RT) or STAGE III or STAGE IV (oligometastatic with all sites treated with definitive intent):  H&P/CT chest with/without contrast every 3 to 6 months for 3 years Then H&P and CT chest with/without contrast every 6 months for 2  years Then H&P and low-dose noncontrast CT chest annually.   Residual or new radiographic abnormalities May require more frequent imaging. - Cancer survivorship care - Smoking cessation, advice, counseling, and pharmacotherapy - FDG-PET/CT is not routinely indicated - Brain MRI as clinically indicated based on risk assessment - If  concern for recurrence, FDG-PET/CT + MRI brain with contrast     2.  Atherosclerotic disease - CT chest from 09/25/2023 showed extensive atherosclerotic changes and areas of potential significant stenosis seen including the origin of the left common carotid artery, left subclavian, and right vertebral artery.  There is also significant stenosis of the renal arteries, left greater than right. - PLAN: *** Referral to vascular surgeon ***  3.  Social/family history: - She lives with her son.  Able to do ADLs but slowly.  Able to do some IADLs. 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 - No asbestos exposure. - She smoked 1 pack/day for past 50 years.  She is trying to cut back and is smoking less than 5 cigarettes a day and using nicotine  gum at this time.*** - Niece had lung cancer.  PLAN SUMMARY: >> *** >> *** >> ***   REVIEW OF SYSTEMS: ***  Review of Systems - Oncology  PHYSICAL EXAM:   Performance status (ECOG): {CHL ONC ED:8845999799} *** There were no vitals filed for this visit. Wt Readings from Last 3 Encounters:  01/12/24 157 lb (71.2 kg)  11/27/23 144 lb (65.3 kg)  08/29/23 144 lb 9.6 oz (65.6 kg)   Physical Exam   PAST MEDICAL/SURGICAL HISTORY:  Past Medical History:  Diagnosis Date   Arthritis    Cancer (HCC)    lung   COPD (chronic obstructive pulmonary disease) (HCC)    Depression    Dyspnea    Essential hypertension, benign    Type 2 diabetes mellitus (HCC)    Urinary incontinence    Past Surgical History:  Procedure Laterality Date   BRONCHIAL BIOPSY  07/09/2022   Procedure: BRONCHIAL BIOPSIES;  Surgeon: Brenna Adine CROME, DO;  Location: [facility];  Service: Pulmonary;;   BRONCHIAL BRUSHINGS  07/09/2022   Procedure: BRONCHIAL BRUSHINGS;  Surgeon: Brenna Adine CROME, DO;  Location: [facility];  Service: Pulmonary;;   BRONCHIAL NEEDLE ASPIRATION BIOPSY  07/09/2022   Procedure: BRONCHIAL NEEDLE ASPIRATION BIOPSIES;  Surgeon:  Brenna Adine CROME, DO;  Location: [facility];  Service: Pulmonary;;   CHOLECYSTECTOMY     FIDUCIAL MARKER PLACEMENT  07/09/2022   Procedure: FIDUCIAL MARKER PLACEMENT;  Surgeon: Brenna Adine CROME, DO;  Location: [facility];  Service: Pulmonary;;   FRACTURE SURGERY Left    arm   OPEN REDUCTION INTERNAL FIXATION (ORIF) PROXIMAL PHALANX Left 04/11/2017   Procedure: OPEN REDUCTION INTERNAL FIXATION (ORIF) PROXIMAL PHALANX;  Surgeon: Sissy Cough, MD;  Location: [facility];  Service: Orthopedics;  Laterality: Left;   RESECTION OF MEDIASTINAL MASS N/A 02/18/2019   Procedure: RESECTION OF MEDIASTINAL MASS;  Surgeon: Fleeta Hanford Coy, MD;  Location: [facility] OR;  Service: Thoracic;  Laterality: N/A;   STERNOTOMY N/A 02/18/2019   Procedure: STERNOTOMY;  Surgeon: Fleeta Hanford Coy, MD;  Location: [facility] OR;  Service: Thoracic;  Laterality: N/A;   VIDEO BRONCHOSCOPY WITH ENDOBRONCHIAL ULTRASOUND Bilateral 07/09/2022   Procedure: VIDEO BRONCHOSCOPY WITH ENDOBRONCHIAL ULTRASOUND;  Surgeon: Brenna Adine CROME, DO;  Location: [facility];  Service: Pulmonary;  Laterality: Bilateral;    SOCIAL HISTORY:  Social History   Socioeconomic History   Marital status: Widowed  Spouse name: Not on file   Number of children: Not on file   Years of education: Not on file   Highest education level: Not on file  Occupational History   Occupation: retired  Tobacco Use   Smoking status: Every Day    Current packs/day: 1.50    Average packs/day: 1.5 packs/day for 53.0 years (79.5 ttl pk-yrs)    Types: Cigarettes    Start date: 01/09/1971   Smokeless tobacco: Never   Tobacco comments:    3 cigarettes smoked daily [facility], 07/26/22  Vaping Use   Vaping status: Never Used  Substance and Sexual Activity   Alcohol use: Yes    Comment: occ   Drug use: No   Sexual activity: Not on file  Other Topics Concern   Not on file  Social History Narrative   Not on file   Social Drivers of Health   Financial  Resource Strain: Medium Risk (11/04/2023)   Received from [facility]   Overall Financial Resource Strain (CARDIA)    How hard is it for you to pay for the very basics like food, housing, medical care, and heating?: Somewhat hard  Food Insecurity: No Food Insecurity (11/04/2023)   Received from [facility]   Hunger Vital Sign    Within the past 12 months, you worried that your food would run out before you got the money to buy more.: Never true    Within the past 12 months, the food you bought just didn't last and you didn't have money to get more.: Never true  Transportation Needs: No Transportation Needs (11/04/2023)   Received from [facility] - Transportation    Lack of Transportation (Medical): No    Lack of Transportation (Non-Medical): No  Physical Activity: Not on file  Stress: Not on file  Social Connections: Moderately Isolated (05/31/2023)   Social Connection and Isolation Panel    Frequency of Communication with Friends and Family: More than three times a week    Frequency of Social Gatherings with Friends and Family: Never    Attends Religious Services: More than 4 times per year    Active Member of [employer] or Organizations: No    Attends Banker Meetings: Never    Marital Status: Widowed  Intimate Partner Violence: Not At Risk (05/31/2023)   Humiliation, Afraid, Rape, and Kick questionnaire    Fear of Current or Ex-Partner: No    Emotionally Abused: No    Physically Abused: No    Sexually Abused: No    FAMILY HISTORY:  Family History  Problem Relation Age of Onset   Diabetes Mellitus II Mother    Diabetes Mellitus II Sister    Hypertension Sister    Hypertension Brother     CURRENT MEDICATIONS:  Current Outpatient Medications  Medication Sig Dispense Refill   albuterol  (PROVENTIL  HFA;VENTOLIN  HFA) 108 (90 Base) MCG/ACT inhaler Inhale 2 puffs into the lungs every 4 (four) hours as needed for wheezing or shortness of breath (cough,  shortness of breath or wheezing.). 1 Inhaler 3   amLODipine  (NORVASC ) 5 MG tablet Take 5 mg by mouth.     apixaban  (ELIQUIS ) 5 MG TABS tablet Take 1 tablet (5 mg total) by mouth 2 (two) times daily. 60 tablet 5   bisoprolol  (ZEBETA ) 5 MG tablet Take 1 tablet (5 mg total) by mouth daily. 30 tablet 11   budesonide  (PULMICORT ) 0.25 MG/2ML nebulizer solution One vial with each duoneb up to 4  x daily 240 mL 12   citalopram  (CELEXA ) 20 MG tablet Take 1 tablet (20 mg total) by mouth daily. (Patient not taking: Reported on 01/12/2024) 30 tablet 0   gabapentin  (NEURONTIN ) 300 MG capsule Take 300 mg by mouth daily.     hydrochlorothiazide  (HYDRODIURIL ) 25 MG tablet Take 25 mg by mouth daily. (Patient not taking: Reported on 01/12/2024)     ipratropium-albuterol  (DUONEB) 0.5-2.5 (3) MG/3ML SOLN Take 3 mLs by nebulization every 6 (six) hours as needed (wheezing, shortness of breath).     lovastatin (MEVACOR) 40 MG tablet Take 40 mg by mouth daily.     mirtazapine (REMERON) 15 MG tablet Take 15 mg by mouth at bedtime.     olmesartan  (BENICAR ) 40 MG tablet Take 1 tablet (40 mg total) by mouth daily. 30 tablet 11   omeprazole (PRILOSEC OTC) 20 MG tablet Take 20 mg by mouth daily.     oxybutynin  (DITROPAN ) 5 MG tablet Take 5 mg by mouth 2 (two) times daily.     potassium chloride  SA (KLOR-CON  M) 20 MEQ tablet TAKE ONE TABLET BY MOUTH ONCE DAILY 30 tablet 3   predniSONE  (DELTASONE ) 10 MG tablet red 20 until better then 10 mg daily x 5 d and off 100 tablet 0   TRELEGY ELLIPTA 100-62.5-25 MCG/ACT AEPB Inhale 1 puff into the lungs daily.     No current facility-administered medications for this visit.    ALLERGIES:  No Known Allergies  LABORATORY DATA:  I have reviewed the labs as listed.     Latest Ref Rng & Units 07/23/2023    1:40 PM 07/07/2023    2:06 PM 06/07/2023   12:15 PM  CBC  WBC 4.0 - 10.5 K/uL 9.3  10.8  15.3   Hemoglobin 12.0 - 15.0 g/dL 85.4  86.3  87.7   Hematocrit 36.0 - 46.0 % 44.7  42.0   38.2   Platelets 150 - 400 K/uL 298  339  346       Latest Ref Rng & Units 09/25/2023    5:51 PM 07/23/2023    1:40 PM 07/07/2023    2:06 PM  CMP  Glucose 70 - 99 mg/dL  889  801   BUN 8 - 23 mg/dL  11  13   Creatinine 9.55 - 1.00 mg/dL 8.69  8.70  8.89   Sodium 135 - 145 mmol/L  136  138   Potassium 3.5 - 5.1 mmol/L  4.2  2.7   Chloride 98 - 111 mmol/L  99  94   CO2 22 - 32 mmol/L  23  30   Calcium 8.9 - 10.3 mg/dL  9.4  9.5   Total Protein 6.5 - 8.1 g/dL  7.4  7.3   Total Bilirubin 0.0 - 1.2 mg/dL  0.7  0.7   Alkaline Phos 38 - 126 U/L  101  117   AST 15 - 41 U/L  23  20   ALT 0 - 44 U/L  10  12     DIAGNOSTIC IMAGING:  I have independently reviewed the scans and discussed with the patient. No results found.   WRAP UP:  All questions were answered. The patient knows to call the clinic with any problems, questions or concerns.  Medical decision making: ***  Time spent on visit: I spent {CHL ONC TIME VISIT - DTPQU:[id number]} counseling the patient face to face. The total time spent in the appointment was {CHL ONC TIME VISIT - DTPQU:[id number]} and more than 50%  was on counseling.  Pleasant CHRISTELLA Barefoot, PA-C  ***

## 2024-01-14 ENCOUNTER — Other Ambulatory Visit: Payer: Self-pay

## 2024-01-14 ENCOUNTER — Inpatient Hospital Stay (HOSPITAL_COMMUNITY)
Admission: EM | Admit: 2024-01-14 | Discharge: 2024-01-20 | DRG: 190 | Disposition: A | Discharge status: Home Health | Source: Ambulatory Visit | Attending: Internal Medicine | Admitting: Internal Medicine

## 2024-01-14 ENCOUNTER — Emergency Department (HOSPITAL_COMMUNITY)

## 2024-01-14 ENCOUNTER — Inpatient Hospital Stay: Attending: Hematology | Admitting: Physician Assistant

## 2024-01-14 ENCOUNTER — Encounter (HOSPITAL_COMMUNITY): Payer: Self-pay

## 2024-01-14 VITALS — BP 126/85 | HR 152 | Temp 97.7°F | Resp 20 | Ht 67.0 in | Wt 160.0 lb

## 2024-01-14 DIAGNOSIS — E1165 Type 2 diabetes mellitus with hyperglycemia: Secondary | ICD-10-CM | POA: Diagnosis present

## 2024-01-14 DIAGNOSIS — F17210 Nicotine dependence, cigarettes, uncomplicated: Secondary | ICD-10-CM | POA: Diagnosis present

## 2024-01-14 DIAGNOSIS — R531 Weakness: Secondary | ICD-10-CM | POA: Diagnosis not present

## 2024-01-14 DIAGNOSIS — Z79899 Other long term (current) drug therapy: Secondary | ICD-10-CM | POA: Diagnosis not present

## 2024-01-14 DIAGNOSIS — I130 Hypertensive heart and chronic kidney disease with heart failure and stage 1 through stage 4 chronic kidney disease, or unspecified chronic kidney disease: Secondary | ICD-10-CM | POA: Diagnosis present

## 2024-01-14 DIAGNOSIS — J440 Chronic obstructive pulmonary disease with acute lower respiratory infection: Secondary | ICD-10-CM | POA: Diagnosis not present

## 2024-01-14 DIAGNOSIS — E875 Hyperkalemia: Secondary | ICD-10-CM | POA: Diagnosis present

## 2024-01-14 DIAGNOSIS — Z7951 Long term (current) use of inhaled steroids: Secondary | ICD-10-CM

## 2024-01-14 DIAGNOSIS — I6522 Occlusion and stenosis of left carotid artery: Secondary | ICD-10-CM | POA: Diagnosis not present

## 2024-01-14 DIAGNOSIS — Z9981 Dependence on supplemental oxygen: Secondary | ICD-10-CM

## 2024-01-14 DIAGNOSIS — I5032 Chronic diastolic (congestive) heart failure: Secondary | ICD-10-CM | POA: Diagnosis present

## 2024-01-14 DIAGNOSIS — Z9049 Acquired absence of other specified parts of digestive tract: Secondary | ICD-10-CM

## 2024-01-14 DIAGNOSIS — Z8249 Family history of ischemic heart disease and other diseases of the circulatory system: Secondary | ICD-10-CM | POA: Diagnosis not present

## 2024-01-14 DIAGNOSIS — R0902 Hypoxemia: Secondary | ICD-10-CM | POA: Diagnosis present

## 2024-01-14 DIAGNOSIS — J441 Chronic obstructive pulmonary disease with (acute) exacerbation: Principal | ICD-10-CM | POA: Diagnosis present

## 2024-01-14 DIAGNOSIS — I4891 Unspecified atrial fibrillation: Secondary | ICD-10-CM | POA: Diagnosis present

## 2024-01-14 DIAGNOSIS — Z91148 Patient's other noncompliance with medication regimen for other reason: Secondary | ICD-10-CM

## 2024-01-14 DIAGNOSIS — N183 Chronic kidney disease, stage 3 unspecified: Secondary | ICD-10-CM | POA: Diagnosis present

## 2024-01-14 DIAGNOSIS — F32A Depression, unspecified: Secondary | ICD-10-CM | POA: Diagnosis present

## 2024-01-14 DIAGNOSIS — E785 Hyperlipidemia, unspecified: Secondary | ICD-10-CM | POA: Diagnosis present

## 2024-01-14 DIAGNOSIS — J9611 Chronic respiratory failure with hypoxia: Secondary | ICD-10-CM | POA: Diagnosis present

## 2024-01-14 DIAGNOSIS — Z7901 Long term (current) use of anticoagulants: Secondary | ICD-10-CM

## 2024-01-14 DIAGNOSIS — E1122 Type 2 diabetes mellitus with diabetic chronic kidney disease: Secondary | ICD-10-CM | POA: Diagnosis present

## 2024-01-14 DIAGNOSIS — J9601 Acute respiratory failure with hypoxia: Principal | ICD-10-CM

## 2024-01-14 DIAGNOSIS — Z833 Family history of diabetes mellitus: Secondary | ICD-10-CM | POA: Diagnosis not present

## 2024-01-14 DIAGNOSIS — J189 Pneumonia, unspecified organism: Secondary | ICD-10-CM | POA: Diagnosis not present

## 2024-01-14 DIAGNOSIS — Z716 Tobacco abuse counseling: Secondary | ICD-10-CM

## 2024-01-14 DIAGNOSIS — Z8701 Personal history of pneumonia (recurrent): Secondary | ICD-10-CM

## 2024-01-14 DIAGNOSIS — Z7952 Long term (current) use of systemic steroids: Secondary | ICD-10-CM | POA: Diagnosis not present

## 2024-01-14 DIAGNOSIS — C3412 Malignant neoplasm of upper lobe, left bronchus or lung: Secondary | ICD-10-CM | POA: Diagnosis present

## 2024-01-14 DIAGNOSIS — Z743 Need for continuous supervision: Secondary | ICD-10-CM | POA: Diagnosis not present

## 2024-01-14 DIAGNOSIS — R911 Solitary pulmonary nodule: Secondary | ICD-10-CM

## 2024-01-14 DIAGNOSIS — Z1152 Encounter for screening for COVID-19: Secondary | ICD-10-CM

## 2024-01-14 LAB — CBC WITH DIFFERENTIAL/PLATELET
Abs Immature Granulocytes: 0.38 K/uL — ABNORMAL HIGH (ref 0.00–0.07)
Basophils Absolute: 0 K/uL (ref 0.0–0.1)
Basophils Relative: 0 %
Eosinophils Absolute: 0 K/uL (ref 0.0–0.5)
Eosinophils Relative: 0 %
HCT: 35.8 % — ABNORMAL LOW (ref 36.0–46.0)
Hemoglobin: 10.5 g/dL — ABNORMAL LOW (ref 12.0–15.0)
Immature Granulocytes: 3 %
Lymphocytes Relative: 9 %
Lymphs Abs: 1.4 K/uL (ref 0.7–4.0)
MCH: 27.7 pg (ref 26.0–34.0)
MCHC: 29.3 g/dL — ABNORMAL LOW (ref 30.0–36.0)
MCV: 94.5 fL (ref 80.0–100.0)
Monocytes Absolute: 1.2 K/uL — ABNORMAL HIGH (ref 0.1–1.0)
Monocytes Relative: 8 %
Neutro Abs: 12.1 K/uL — ABNORMAL HIGH (ref 1.7–7.7)
Neutrophils Relative %: 80 %
Platelets: 410 K/uL — ABNORMAL HIGH (ref 150–400)
RBC: 3.79 MIL/uL — ABNORMAL LOW (ref 3.87–5.11)
RDW: 16.3 % — ABNORMAL HIGH (ref 11.5–15.5)
WBC: 15.2 K/uL — ABNORMAL HIGH (ref 4.0–10.5)
nRBC: 0.3 % — ABNORMAL HIGH (ref 0.0–0.2)

## 2024-01-14 LAB — BASIC METABOLIC PANEL WITH GFR
Anion gap: 8 (ref 5–15)
BUN: 35 mg/dL — ABNORMAL HIGH (ref 8–23)
CO2: 34 mmol/L — ABNORMAL HIGH (ref 22–32)
Calcium: 9.2 mg/dL (ref 8.9–10.3)
Chloride: 98 mmol/L (ref 98–111)
Creatinine, Ser: 1.47 mg/dL — ABNORMAL HIGH (ref 0.44–1.00)
GFR, Estimated: 38 mL/min — ABNORMAL LOW (ref >60)
Glucose, Bld: 207 mg/dL — ABNORMAL HIGH (ref 70–99)
Potassium: 5 mmol/L (ref 3.5–5.1)
Sodium: 140 mmol/L (ref 135–145)

## 2024-01-14 LAB — RESP PANEL BY RT-PCR (RSV, FLU A&B, COVID)  RVPGX2
Influenza A by PCR: NEGATIVE
Influenza B by PCR: NEGATIVE
Resp Syncytial Virus by PCR: NEGATIVE
SARS Coronavirus 2 by RT PCR: NEGATIVE

## 2024-01-14 LAB — TROPONIN T, HIGH SENSITIVITY
Troponin T High Sensitivity: 21 ng/L — ABNORMAL HIGH (ref 0–19)
Troponin T High Sensitivity: 19 ng/L (ref 0–19)

## 2024-01-14 LAB — PRO BRAIN NATRIURETIC PEPTIDE: Pro Brain Natriuretic Peptide: 3687 pg/mL — ABNORMAL HIGH (ref <300.0)

## 2024-01-14 MED ORDER — ALBUTEROL SULFATE (2.5 MG/3ML) 0.083% IN NEBU
10.0000 mg | INHALATION_SOLUTION | Freq: Once | RESPIRATORY_TRACT | Status: AC
  Administered 2024-01-14: 10 mg via RESPIRATORY_TRACT
  Filled 2024-01-14: qty 12

## 2024-01-14 MED ORDER — METHYLPREDNISOLONE SODIUM SUCC 125 MG IJ SOLR
125.0000 mg | Freq: Once | INTRAMUSCULAR | Status: AC
  Administered 2024-01-14: 125 mg via INTRAVENOUS
  Filled 2024-01-14: qty 2

## 2024-01-14 MED ORDER — GUAIFENESIN-DM 100-10 MG/5ML PO SYRP
5.0000 mL | ORAL_SOLUTION | ORAL | Status: DC | PRN
  Administered 2024-01-14 – 2024-01-17 (×2): 5 mL via ORAL
  Filled 2024-01-14 (×2): qty 10

## 2024-01-14 MED ORDER — HYDROCODONE-ACETAMINOPHEN 5-325 MG PO TABS: 1.0000 | ORAL_TABLET | ORAL | Status: DC | PRN

## 2024-01-14 MED ORDER — BUDESONIDE 0.25 MG/2ML IN SUSP
0.2500 mg | Freq: Two times a day (BID) | RESPIRATORY_TRACT | Status: DC
  Administered 2024-01-14 – 2024-01-20 (×12): 0.25 mg via RESPIRATORY_TRACT
  Filled 2024-01-14 (×13): qty 2

## 2024-01-14 MED ORDER — DILTIAZEM HCL-DEXTROSE 125-5 MG/125ML-% IV SOLN (PREMIX)
5.0000 mg/h | INTRAVENOUS | Status: DC
  Administered 2024-01-14: 12.5 mg/h via INTRAVENOUS
  Administered 2024-01-14: 5 mg/h via INTRAVENOUS
  Administered 2024-01-15 – 2024-01-16 (×2): 12.5 mg/h via INTRAVENOUS
  Filled 2024-01-14 (×6): qty 125

## 2024-01-14 MED ORDER — MIRTAZAPINE 15 MG PO TABS
15.0000 mg | ORAL_TABLET | Freq: Every day | ORAL | Status: DC
  Administered 2024-01-14 – 2024-01-19 (×6): 15 mg via ORAL
  Filled 2024-01-14 (×6): qty 1

## 2024-01-14 MED ORDER — PHENOL 1.4 % MT LIQD
1.0000 | OROMUCOSAL | Status: DC | PRN
  Administered 2024-01-14 – 2024-01-15 (×2): 1 via OROMUCOSAL
  Filled 2024-01-14: qty 177

## 2024-01-14 MED ORDER — MELATONIN 5 MG PO TABS
5.0000 mg | ORAL_TABLET | Freq: Every evening | ORAL | Status: AC | PRN
  Administered 2024-01-14 – 2024-01-17 (×4): 5 mg via ORAL
  Filled 2024-01-14 (×4): qty 1

## 2024-01-14 MED ORDER — APIXABAN 5 MG PO TABS
5.0000 mg | ORAL_TABLET | Freq: Two times a day (BID) | ORAL | Status: DC
  Administered 2024-01-14 – 2024-01-20 (×12): 5 mg via ORAL
  Filled 2024-01-14 (×12): qty 1

## 2024-01-14 MED ORDER — DILTIAZEM HCL 25 MG/5ML IV SOLN
INTRAVENOUS | Status: AC
  Administered 2024-01-14: 10 mg via INTRAVENOUS
  Filled 2024-01-14: qty 5

## 2024-01-14 MED ORDER — ACETAMINOPHEN 325 MG PO TABS: 650.0000 mg | ORAL_TABLET | Freq: Four times a day (QID) | ORAL | Status: DC | PRN

## 2024-01-14 MED ORDER — METHYLPREDNISOLONE SODIUM SUCC 40 MG IJ SOLR
40.0000 mg | Freq: Two times a day (BID) | INTRAMUSCULAR | Status: DC
Start: 2024-01-14 — End: 2024-01-15
  Administered 2024-01-14 – 2024-01-15 (×2): 40 mg via INTRAVENOUS
  Filled 2024-01-14 (×2): qty 1

## 2024-01-14 MED ORDER — BISACODYL 5 MG PO TBEC
5.0000 mg | DELAYED_RELEASE_TABLET | Freq: Every day | ORAL | Status: DC | PRN
  Administered 2024-01-16: 5 mg via ORAL
  Filled 2024-01-14: qty 1

## 2024-01-14 MED ORDER — IPRATROPIUM-ALBUTEROL 0.5-2.5 (3) MG/3ML IN SOLN
6.0000 mL | Freq: Once | RESPIRATORY_TRACT | Status: AC
  Administered 2024-01-14: 6 mL via RESPIRATORY_TRACT
  Filled 2024-01-14: qty 3

## 2024-01-14 MED ORDER — DILTIAZEM LOAD VIA INFUSION
10.0000 mg | Freq: Once | INTRAVENOUS | Status: DC
  Filled 2024-01-14: qty 10

## 2024-01-14 MED ORDER — IPRATROPIUM-ALBUTEROL 0.5-2.5 (3) MG/3ML IN SOLN
3.0000 mL | Freq: Four times a day (QID) | RESPIRATORY_TRACT | Status: AC
Start: 2024-01-14 — End: ?
  Administered 2024-01-14 – 2024-01-17 (×11): 3 mL via RESPIRATORY_TRACT
  Filled 2024-01-14 (×13): qty 3

## 2024-01-14 MED ORDER — DILTIAZEM HCL 50 MG/10ML IV SOLN: 10.0000 mg | Freq: Once | INTRAVENOUS | Status: AC

## 2024-01-14 MED ORDER — ACETAMINOPHEN 650 MG RE SUPP: 650.0000 mg | Freq: Four times a day (QID) | RECTAL | Status: DC | PRN

## 2024-01-14 MED ORDER — SENNOSIDES-DOCUSATE SODIUM 8.6-50 MG PO TABS
1.0000 | ORAL_TABLET | Freq: Every evening | ORAL | Status: DC | PRN
  Administered 2024-01-16: 1 via ORAL
  Filled 2024-01-14: qty 1

## 2024-01-14 MED ORDER — SODIUM CHLORIDE 0.9 % IV SOLN
2.0000 g | Freq: Two times a day (BID) | INTRAVENOUS | Status: DC
  Administered 2024-01-14 – 2024-01-15 (×2): 2 g via INTRAVENOUS
  Filled 2024-01-14 (×2): qty 12.5

## 2024-01-14 NOTE — ED Triage Notes (Signed)
 [facility]b RN from [facility], c/o Tachycardia with HR >180. HR 120 in Triage.

## 2024-01-14 NOTE — H&P (Addendum)
 History and Physical    Holly May FMW:[id number] DOB: 1951/01/01 DOA: 01/14/2024  PCP: Katrinka Aquas, MD   Patient coming from: [facility] office I have personally briefly reviewed patient's old medical records in [facility]  Chief Complaint: shortness of breath, hypoxia  HPI: Holly May is a 73 y.o. female with medical history significant of   lung cancer, COPD on home oxygen , A fib, dyspnea, depression, hypertension, type 2 diabetes who was sent from cancer center for evaluation of hypoxia.  Patient normally uses 2 L/min of oxygen  at home and in their office she was satting in the 80s.  Patient has history of atrial fibrillation as well and was tachycardic in the office. Patient apparently was treated at outside hospital recently for pneumonia and was discharged on Friday.  She says she is still taking steroids.  She reports of ongoing cough with thin sputum.  No fever chills, nausea, vomiting, abdominal pain.  She does have lower extremity edema and says they actually are better than before.  ED Course:   In the ER she was diffusely wheezy and did receive nebulizer treatment as well as IV Solu-Medrol  after which wheezing improved.  still diminished on both sides.  She was initiated on IV Cardizem  drip with gradual improvement in HR. Labs: sodium 140, potassium 5.0, BUN 35, creatinine 1.47 with baseline 1.2, blood glucose 207, proBNP 3687, troponin T 21 with repeat 19.  WBC elevated 15,000, hemoglobin stable at 10.5, platelet count 410.  Respiratory panel negative for influenza A, influenza B, RSV and COVID-19.  Chest x-ray negative for acute cardiopulmonary abnormality, stable left upper lobe scarring, stable old right rib fractures.  Patient admitted for COPD exacerbation with A-fib RVR requiring Cardizem  drip.   Review of Systems: As per HPI otherwise all other systems were reviewed and are negative.   Past Medical History:  Diagnosis Date   Arthritis    Cancer  (HCC)    lung   COPD (chronic obstructive pulmonary disease) (HCC)    Depression    Dyspnea    Essential hypertension, benign    Type 2 diabetes mellitus (HCC)    Urinary incontinence     Past Surgical History:  Procedure Laterality Date   BRONCHIAL BIOPSY  07/09/2022   Procedure: BRONCHIAL BIOPSIES;  Surgeon: Brenna Adine CROME, DO;  Location: [facility];  Service: Pulmonary;;   BRONCHIAL BRUSHINGS  07/09/2022   Procedure: BRONCHIAL BRUSHINGS;  Surgeon: Brenna Adine CROME, DO;  Location: [facility];  Service: Pulmonary;;   BRONCHIAL NEEDLE ASPIRATION BIOPSY  07/09/2022   Procedure: BRONCHIAL NEEDLE ASPIRATION BIOPSIES;  Surgeon: Brenna Adine CROME, DO;  Location: [facility];  Service: Pulmonary;;   CHOLECYSTECTOMY     FIDUCIAL MARKER PLACEMENT  07/09/2022   Procedure: FIDUCIAL MARKER PLACEMENT;  Surgeon: Brenna Adine CROME, DO;  Location: [facility];  Service: Pulmonary;;   FRACTURE SURGERY Left    arm   OPEN REDUCTION INTERNAL FIXATION (ORIF) PROXIMAL PHALANX Left 04/11/2017   Procedure: OPEN REDUCTION INTERNAL FIXATION (ORIF) PROXIMAL PHALANX;  Surgeon: Sissy Cough, MD;  Location: [facility];  Service: Orthopedics;  Laterality: Left;   RESECTION OF MEDIASTINAL MASS N/A 02/18/2019   Procedure: RESECTION OF MEDIASTINAL MASS;  Surgeon: Fleeta Hanford Coy, MD;  Location: [facility] OR;  Service: Thoracic;  Laterality: N/A;   STERNOTOMY N/A 02/18/2019   Procedure: STERNOTOMY;  Surgeon: Fleeta Hanford Coy, MD;  Location: [facility] OR;  Service: Thoracic;  Laterality: N/A;   VIDEO BRONCHOSCOPY WITH ENDOBRONCHIAL ULTRASOUND  Bilateral 07/09/2022   Procedure: VIDEO BRONCHOSCOPY WITH ENDOBRONCHIAL ULTRASOUND;  Surgeon: Brenna Adine CROME, DO;  Location: [facility];  Service: Pulmonary;  Laterality: Bilateral;     reports that she has been smoking cigarettes. She started smoking about 53 years ago. She has a 79.5 pack-year smoking history. She has never used smokeless tobacco. She reports  current alcohol use. She reports that she does not use drugs.  No Known Allergies  Family History  Problem Relation Age of Onset   Diabetes Mellitus II Mother    Diabetes Mellitus II Sister    Hypertension Sister    Hypertension Brother     Prior to Admission medications   Medication Sig Start Date End Date Taking? Authorizing Provider  budesonide  (PULMICORT ) 0.25 MG/2ML nebulizer solution One vial with each duoneb up to 4 x daily 11/27/23  Yes Wert, Michael B, MD  ipratropium-albuterol  (DUONEB) 0.5-2.5 (3) MG/3ML SOLN Take 3 mLs by nebulization every 6 (six) hours as needed (wheezing, shortness of breath). 06/11/22  Yes [provider]  TRELEGY ELLIPTA 100-62.5-25 MCG/ACT AEPB Inhale 1 puff into the lungs daily. 07/17/23  Yes [provider]  albuterol  (PROVENTIL  HFA;VENTOLIN  HFA) 108 (90 Base) MCG/ACT inhaler Inhale 2 puffs into the lungs every 4 (four) hours as needed for wheezing or shortness of breath (cough, shortness of breath or wheezing.). 02/18/18   Maree, Pratik D, DO  amLODipine  (NORVASC ) 5 MG tablet Take 5 mg by mouth. 10/29/23   [provider]  apixaban  (ELIQUIS ) 5 MG TABS tablet Take 1 tablet (5 mg total) by mouth 2 (two) times daily. 05/01/23   Mallipeddi, Vishnu P, MD  bisoprolol  (ZEBETA ) 5 MG tablet Take 1 tablet (5 mg total) by mouth daily. 05/01/23   Mallipeddi, Vishnu P, MD  citalopram  (CELEXA ) 20 MG tablet Take 1 tablet (20 mg total) by mouth daily. Patient not taking: No sig reported 06/14/16   Vicci Pen L, MD  furosemide  (LASIX ) 20 MG tablet TAKE ONE TABLET BY MOUTH TWICE DAILY; Duration: 30 12/24/23   [provider]  gabapentin  (NEURONTIN ) 300 MG capsule Take 300 mg by mouth daily.    [provider]  hydrochlorothiazide  (HYDRODIURIL ) 25 MG tablet Take 25 mg by mouth daily. Patient not taking: No sig reported 07/17/23   [provider]  lovastatin (MEVACOR) 40 MG tablet Take 40 mg by mouth daily.    [provider]  mirtazapine (REMERON) 15 MG tablet Take 15 mg by mouth at bedtime.    [provider]  naltrexone (DEPADE) 50 MG tablet 1 tablet Orally Once a day; Duration: 30 days 11/07/23   [provider]  olmesartan  (BENICAR ) 40 MG tablet Take 1 tablet (40 mg total) by mouth daily. 02/11/23 02/11/24  Darlean Ozell NOVAK, MD  omeprazole (PRILOSEC OTC) 20 MG tablet Take 20 mg by mouth daily.    [provider]  omeprazole (PRILOSEC) 20 MG capsule Take 20 mg by mouth every morning.    [provider]  oxybutynin  (DITROPAN ) 5 MG tablet Take 5 mg by mouth 2 (two) times daily.    [provider]  potassium chloride  SA (KLOR-CON  M) 20 MEQ tablet TAKE ONE TABLET BY MOUTH ONCE DAILY 11/06/23   Rogers Hai, MD  predniSONE  (DELTASONE ) 10 MG tablet red 20 until better then 10 mg daily x 5 d and off 01/12/24   Darlean Ozell NOVAK, MD  torsemide (DEMADEX) 20 MG tablet Take 20 mg by mouth daily. 01/09/24   [provider]  Physical Exam: Vitals:   01/14/24 1038 01/14/24 1230 01/14/24 1315 01/14/24 1325  BP:  119/80 115/78   Pulse:  (!) 117 (!) 119 (!) 113  Resp:  17 20 18   SpO2: 100% 100% 94% 98%  Weight:      Height:        Constitutional: NAD, calm, comfortable Vitals:   01/14/24 1038 01/14/24 1230 01/14/24 1315 01/14/24 1325  BP:  119/80 115/78   Pulse:  (!) 117 (!) 119 (!) 113  Resp:  17 20 18   SpO2: 100% 100% 94% 98%  Weight:      Height:       General: Alert oriented not in any acute distress Chest: Very diminished bilaterally, mild wheezing bilaterally, no crackles CVS: S1, S2, tachycardic, irregular, no murmur Abdomen: Soft, nontender, no organomegaly Extremities: Bilateral lower extremity pitting edema Neuro: No focal deficits,  Labs on Admission: I have personally reviewed following labs and imaging studies  CBC: Recent Labs  Lab 01/14/24 1044  WBC 15.2*  NEUTROABS 12.1*  HGB 10.5*  HCT 35.8*  MCV 94.5  PLT 410*    Basic Metabolic Panel: Recent Labs  Lab 01/14/24 1044  NA 140  K 5.0  CL 98  CO2 34*  GLUCOSE 207*  BUN 35*  CREATININE 1.47*  CALCIUM 9.2   GFR: Estimated Creatinine Clearance: 34.1 mL/min (A) (by C-G formula based on SCr of 1.47 mg/dL (H)). Liver Function Tests: No results for input(s): AST, ALT, ALKPHOS, BILITOT, PROT, ALBUMIN  in the last 168 hours. No results for input(s): LIPASE, AMYLASE in the last 168 hours. No results for input(s): AMMONIA in the last 168 hours. Coagulation Profile: No results for input(s): INR, PROTIME in the last 168 hours. Cardiac Enzymes: No results for input(s): CKTOTAL, CKMB, CKMBINDEX, TROPONINI in the last 168 hours. BNP (last 3 results) Recent Labs    01/14/24 1044  PROBNP 3,687.0*   HbA1C: No results for input(s): HGBA1C in the last 72 hours. CBG: No results for input(s): GLUCAP in the last 168 hours. Lipid Profile: No results for input(s): CHOL, HDL, LDLCALC, TRIG, CHOLHDL, LDLDIRECT in the last 72 hours. Thyroid  Function Tests: No results for input(s): TSH, T4TOTAL, FREET4, T3FREE, THYROIDAB in the last 72 hours. Anemia Panel: No results for input(s): VITAMINB12, FOLATE, FERRITIN, TIBC, IRON , RETICCTPCT in the last 72 hours. Urine analysis:    Component Value Date/Time   COLORURINE YELLOW 05/31/2023 0051   APPEARANCEUR CLEAR 05/31/2023 0051   LABSPEC 1.033 (H) 05/31/2023 0051   PHURINE 6.0 05/31/2023 0051   GLUCOSEU NEGATIVE 05/31/2023 0051   HGBUR SMALL (A) 05/31/2023 0051   BILIRUBINUR NEGATIVE 05/31/2023 0051   KETONESUR NEGATIVE 05/31/2023 0051   PROTEINUR >=300 (A) 05/31/2023 0051   UROBILINOGEN 4.0 (H) 03/31/2013 0454   NITRITE POSITIVE (A) 05/31/2023 0051   LEUKOCYTESUR NEGATIVE 05/31/2023 0051    Radiological Exams on Admission: DG Chest 1 View Result Date: 01/14/2024 EXAM: 1 VIEW(S) XRAY OF THE CHEST 01/14/2024 11:18:50 AM COMPARISON:  07/23/2023 CLINICAL HISTORY: sob. FINDINGS: LUNGS AND PLEURA: Probable left upper lobe scarring. No focal pulmonary opacity. No pulmonary edema. No pleural effusion. No pneumothorax. HEART AND MEDIASTINUM: Stable cardiomediastinal silhouette. Sternotomy wires are noted. BONES AND SOFT TISSUES: Probable old right rib fractures are noted. No acute osseous abnormality. IMPRESSION: 1. No acute cardiopulmonary abnormality. 2. Stable left upper lobe scarring. 3. Stable old right rib fractures. Electronically signed by: Lynwood Seip MD 01/14/2024 11:36 AM EDT RP Workstation: [id number]    EKG: Independently reviewed. A fib  RVR  Assessment/Plan  A-fib with RVR. -Heart rate improved to 80s to 90s with IV Cardizem .  Will titrated it down.  Resume home bisoprolol . Continue Eliquis  5 mg twice daily  COPD exacerbation Will treat with scheduled DuoNeb, IV Solu-Medrol  40 twice daily, Supportive care, supplemental oxygen , incentive spirometry Chest x-ray negative for acute findings.  Consider CT scan pending clinical progress COVID-19, RSV, flu negative  Lung cancer Continue to follow outpatient  Hypertension Will monitor blood pressures.  PTA medications on hold to avoid hypotension while on IV Cardizem .  Resume gradually  Hyperlipidemia Continue statin  CKD stage III Creatinine increased to 1.47, will recheck in the a.m.  Baseline appears to be around 1.2 on average   Chronic HFpEF On diuretics at home.  BNP elevated 3687, baseline appears to be around that range.  Will hold diuretics today due to elevated creatinine.  Patient reports improvement in lower extremity edema.    DVT prophylaxis: Chronic Eliquis  Code Status: Full CODE STATUS Family Communication: No family at the bedside Disposition Plan: Patient initially planned to admit to [facility], ICU.  Now transferred to [facility] per logistics  consults called: none Admission status: inpt   Severity of Illness: The appropriate  patient status for this patient is INPATIENT. Inpatient status is judged to be reasonable and necessary in order to provide the required intensity of service to ensure the patient's safety. The patient's presenting symptoms, physical exam findings, and initial radiographic and laboratory data in the context of their chronic comorbidities is felt to place them at high risk for further clinical deterioration. Furthermore, it is not anticipated that the patient will be medically stable for discharge from the hospital within 2 midnights of admission.   * I certify that at the point of admission it is my clinical judgment that the patient will require inpatient hospital care spanning beyond 2 midnights from the point of admission due to high intensity of service, high risk for further deterioration and high frequency of surveillance required.DEWAINE Derryl Duval MD [facility]  01/14/2024, 2:29 PM

## 2024-01-14 NOTE — ED Provider Notes (Signed)
 [place] EMERGENCY DEPARTMENT AT [facility] Provider Note   CSN: [id number] Arrival date & time: 01/14/24  1014     Patient presents with: Tachycardia   Holly May is a 73 y.o. female.   73 year old female presents for evaluation of shortness of breath.  She was sent from the cancer center upstairs.  Cancer center states that patient uses 2 L nasal cannula at baseline but has been 80% on room air in the office.  They put her on 2 L and she was still in the 80s.  Patient also with a history of A-fib and heart rate was 100s to 180s while she was in the office as well.  Provider observed and stated improved with oxygen .  Patient states she was recently treated at outside hospital and discharged on Friday for pneumonia.  States she is still taking steroids.  She does admit to increased difficulty breathing and wheezing as well as coughing but denies any fevers.        Prior to Admission medications   Medication Sig Start Date End Date Taking? Authorizing Provider  budesonide  (PULMICORT ) 0.25 MG/2ML nebulizer solution One vial with each duoneb up to 4 x daily 11/27/23  Yes Darlean Ozell NOVAK, MD  ipratropium-albuterol  (DUONEB) 0.5-2.5 (3) MG/3ML SOLN Take 3 mLs by nebulization every 6 (six) hours as needed (wheezing, shortness of breath). 06/11/22  Yes [provider]  TRELEGY ELLIPTA 100-62.5-25 MCG/ACT AEPB Inhale 1 puff into the lungs daily. 07/17/23  Yes [provider]  albuterol  (PROVENTIL  HFA;VENTOLIN  HFA) 108 (90 Base) MCG/ACT inhaler Inhale 2 puffs into the lungs every 4 (four) hours as needed for wheezing or shortness of breath (cough, shortness of breath or wheezing.). 02/18/18   Maree, Pratik D, DO  amLODipine  (NORVASC ) 5 MG tablet Take 5 mg by mouth. 10/29/23   [provider]  apixaban  (ELIQUIS ) 5 MG TABS tablet Take 1 tablet (5 mg total) by mouth 2 (two) times daily. 05/01/23   Mallipeddi, Vishnu P, MD  bisoprolol  (ZEBETA ) 5 MG tablet Take 1  tablet (5 mg total) by mouth daily. 05/01/23   Mallipeddi, Vishnu P, MD  citalopram  (CELEXA ) 20 MG tablet Take 1 tablet (20 mg total) by mouth daily. Patient not taking: No sig reported 06/14/16   Vicci, Clanford L, MD  furosemide  (LASIX ) 20 MG tablet TAKE ONE TABLET BY MOUTH TWICE DAILY; Duration: 30 12/24/23   [provider]  gabapentin  (NEURONTIN ) 300 MG capsule Take 300 mg by mouth daily.    [provider]  hydrochlorothiazide  (HYDRODIURIL ) 25 MG tablet Take 25 mg by mouth daily. Patient not taking: No sig reported 07/17/23   [provider]  lovastatin (MEVACOR) 40 MG tablet Take 40 mg by mouth daily.    [provider]  mirtazapine (REMERON) 15 MG tablet Take 15 mg by mouth at bedtime.    [provider]  naltrexone (DEPADE) 50 MG tablet 1 tablet Orally Once a day; Duration: 30 days 11/07/23   [provider]  olmesartan  (BENICAR ) 40 MG tablet Take 1 tablet (40 mg total) by mouth daily. 02/11/23 02/11/24  Darlean Ozell NOVAK, MD  omeprazole (PRILOSEC OTC) 20 MG tablet Take 20 mg by mouth daily.    [provider]  omeprazole (PRILOSEC) 20 MG capsule Take 20 mg by mouth every morning.    [provider]  oxybutynin  (DITROPAN ) 5 MG tablet Take 5 mg by mouth 2 (two) times daily.    [provider]  potassium  chloride SA (KLOR-CON  M) 20 MEQ tablet TAKE ONE TABLET BY MOUTH ONCE DAILY 11/06/23   Rogers Hai, MD  predniSONE  (DELTASONE ) 10 MG tablet red 20 until better then 10 mg daily x 5 d and off 01/12/24   Darlean Ozell NOVAK, MD  torsemide (DEMADEX) 20 MG tablet Take 20 mg by mouth daily. 01/09/24   [provider]    Allergies: Patient has no known allergies.    Review of Systems  Constitutional:  Negative for chills and fever.  HENT:  Negative for ear pain and sore throat.   Eyes:  Negative for pain and visual disturbance.  Respiratory:  Positive for cough, chest tightness, shortness of breath and  wheezing.   Cardiovascular:  Negative for chest pain and palpitations.  Gastrointestinal:  Negative for abdominal pain and vomiting.  Genitourinary:  Negative for dysuria and hematuria.  Musculoskeletal:  Negative for arthralgias and back pain.  Skin:  Negative for color change and rash.  Neurological:  Negative for seizures and syncope.  All other systems reviewed and are negative.   Updated Vital Signs BP (!) 132/98   Pulse 92   Resp 17   Ht 5' 7 (1.702 m)   Wt 73.5 kg   SpO2 96%   BMI 25.37 kg/m   Physical Exam Vitals and nursing note reviewed.  Constitutional:      General: She is not in acute distress.    Appearance: Normal appearance. She is well-developed. She is ill-appearing.  HENT:     Head: Normocephalic and atraumatic.  Eyes:     Conjunctiva/sclera: Conjunctivae normal.  Cardiovascular:     Rate and Rhythm: Tachycardia present. Rhythm irregular.     Heart sounds: No murmur heard. Pulmonary:     Effort: Respiratory distress present.     Breath sounds: Wheezing and rhonchi present. No rales.     Comments: Increased work of breathing  Abdominal:     Palpations: Abdomen is soft.     Tenderness: There is no abdominal tenderness.  Musculoskeletal:        General: No swelling.     Cervical back: Neck supple.  Skin:    General: Skin is warm and dry.     Capillary Refill: Capillary refill takes less than 2 seconds.  Neurological:     Mental Status: She is alert.  Psychiatric:        Mood and Affect: Mood normal.     (all labs ordered are listed, but only abnormal results are displayed) Labs Reviewed  BASIC METABOLIC PANEL WITH GFR - Abnormal; Notable for the following components:      Result Value   CO2 34 (*)    Glucose, Bld 207 (*)    BUN 35 (*)    Creatinine, Ser 1.47 (*)    GFR, Estimated 38 (*)    All other components within normal limits  PRO BRAIN NATRIURETIC PEPTIDE - Abnormal; Notable for the following components:   Pro Brain Natriuretic  Peptide 3,687.0 (*)    All other components within normal limits  CBC WITH DIFFERENTIAL/PLATELET - Abnormal; Notable for the following components:   WBC 15.2 (*)    RBC 3.79 (*)    Hemoglobin 10.5 (*)    HCT 35.8 (*)    MCHC 29.3 (*)    RDW 16.3 (*)    Platelets 410 (*)    nRBC 0.3 (*)    Neutro Abs 12.1 (*)    Monocytes Absolute 1.2 (*)    Abs Immature Granulocytes  0.38 (*)    All other components within normal limits  TROPONIN T, HIGH SENSITIVITY - Abnormal; Notable for the following components:   Troponin T High Sensitivity 21 (*)    All other components within normal limits  RESP PANEL BY RT-PCR (RSV, FLU A&B, COVID)  [id number]  RESPIRATORY PANEL BY PCR  TROPONIN T, HIGH SENSITIVITY    EKG: EKG Interpretation Date/Time:  Wednesday January 14 2024 10:25:59 EDT Ventricular Rate:  117 PR Interval:    QRS Duration:  81 QT Interval:  308 QTC Calculation: 430 R Axis:   3  Text Interpretation: Atrial fibrillation with rapid ventricular response Nonspecific T wave changes in the lateral leads Compared with prior EKG from 07/23/2023 Confirmed by Gennaro Bouchard ([id number]) on 01/14/2024 10:38:15 AM  Radiology: [facility] Chest 1 View Result Date: 01/14/2024 EXAM: 1 VIEW(S) XRAY OF THE CHEST 01/14/2024 11:18:50 AM COMPARISON: 07/23/2023 CLINICAL HISTORY: sob. FINDINGS: LUNGS AND PLEURA: Probable left upper lobe scarring. No focal pulmonary opacity. No pulmonary edema. No pleural effusion. No pneumothorax. HEART AND MEDIASTINUM: Stable cardiomediastinal silhouette. Sternotomy wires are noted. BONES AND SOFT TISSUES: Probable old right rib fractures are noted. No acute osseous abnormality. IMPRESSION: 1. No acute cardiopulmonary abnormality. 2. Stable left upper lobe scarring. 3. Stable old right rib fractures. Electronically signed by: Lynwood Seip MD 01/14/2024 11:36 AM EDT RP Workstation: [id number]     Procedures   Medications Ordered in the ED  diltiazem  (CARDIZEM ) 125 mg in dextrose  5% 125  mL (1 mg/mL) infusion (7.5 mg/hr Intravenous Rate/Dose Change 01/14/24 1332)  mirtazapine (REMERON) tablet 15 mg (has no administration in time range)  apixaban  (ELIQUIS ) tablet 5 mg (has no administration in time range)  budesonide  (PULMICORT ) nebulizer solution 0.25 mg (has no administration in time range)  ipratropium-albuterol  (DUONEB) 0.5-2.5 (3) MG/3ML nebulizer solution 3 mL (has no administration in time range)  methylPREDNISolone  sodium succinate (SOLU-MEDROL ) 40 mg/mL injection 40 mg (has no administration in time range)  acetaminophen  (TYLENOL ) tablet 650 mg (has no administration in time range)    Or  acetaminophen  (TYLENOL ) suppository 650 mg (has no administration in time range)  HYDROcodone -acetaminophen  (NORCO/VICODIN) 5-325 MG per tablet 1-2 tablet (has no administration in time range)  bisacodyl  (DULCOLAX) EC tablet 5 mg (has no administration in time range)  senna-docusate (Senokot-S) tablet 1 tablet (has no administration in time range)  ceFEPIme (MAXIPIME) 2 g in sodium chloride  0.9 % 100 mL IVPB (has no administration in time range)  albuterol  (PROVENTIL ) (2.5 MG/3ML) 0.083% nebulizer solution 10 mg (10 mg Nebulization Given 01/14/24 1038)  ipratropium-albuterol  (DUONEB) 0.5-2.5 (3) MG/3ML nebulizer solution 6 mL (6 mLs Nebulization Given 01/14/24 1038)  diltiazem  (CARDIZEM ) injection SOLN 10 mg (10 mg Intravenous Given 01/14/24 1114)  methylPREDNISolone  sodium succinate (SOLU-MEDROL ) 125 mg/2 mL injection 125 mg (125 mg Intravenous Given 01/14/24 1231)                                    Medical Decision Making Cardiac monitor interpretation: Atrial fibrillation with rapid ventricular response, no other ectopy  Social determinants of health: Patient has a history of tobacco abuse and medication noncompliance  Patient here for evaluation of hypoxia and A-fib with RVR.  She arrived in acute distress with poor air movement and wheezing throughout as well as significant  A-fib with RVR was placed on 3 L nasal cannula and oxygen  saturations did improve.  A-fib was not controlled with a  bolus of Cardizem  and she was started on a drip.  Treated for COPD exacerbation as well with DuoNeb and steroids.  I discussed patient's case with the hospitalist and patient will be admitted for further workup and management.  She is agreeable to plan.  Problems Addressed: Atrial fibrillation with rapid ventricular response (HCC): acute illness or injury that poses a threat to life or bodily functions COPD exacerbation (HCC): acute illness or injury that poses a threat to life or bodily functions Hypoxia: acute illness or injury that poses a threat to life or bodily functions  Amount and/or Complexity of Data Reviewed Independent Historian: caregiver    Details: I spoke with the PA at the cancer center who called about the patient and she states that the patient is being treated for lung cancer, but was in A-fib with RVR with oxygen  saturations in the 80s despite 2 by nasal cannula at the office today External Data Reviewed: notes.    Details: Outpatient records reviewed and patient has a history of pneumonia which was treated at outside hospital and she was discharged past Friday Labs: ordered. Decision-making details documented in ED Course.    Details: Ordered and reviewed by me and patient has a slight elevation in her troponin which is likely from demand Radiology: ordered and independent interpretation performed. Decision-making details documented in ED Course.    Details: Ordered and Interpreted by me independently radiology Chest x-ray: Shows no acute abnormality in the chest ECG/medicine tests: ordered and independent interpretation performed. Decision-making details documented in ED Course.    Details: Ordered and interpreted me in the absence of cardiology and shows A-fib with RVR with nonspecific ST changes that are likely rate related Discussion of management or test  interpretation with external provider(s): Dr. Mcarthur hospitalist-I spoke with him on the phone regarding the patient's case and he will admit the patient for further workup and management  Risk OTC drugs. Prescription drug management. Drug therapy requiring intensive monitoring for toxicity. Decision regarding hospitalization. Diagnosis or treatment significantly limited by social determinants of health. Risk Details: CRITICAL CARE Performed by: Duwaine LITTIE Fusi   Total critical care time: 35 minutes  Critical care time was exclusive of separately billable procedures and treating other patients.  Critical care was necessary to treat or prevent imminent or life-threatening deterioration.  Critical care was time spent personally by me on the following activities: development of treatment plan with patient and/or surrogate as well as nursing, discussions with consultants, evaluation of patient's response to treatment, examination of patient, obtaining history from patient or surrogate, ordering and performing treatments and interventions, ordering and review of laboratory studies, ordering and review of radiographic studies, pulse oximetry and re-evaluation of patient's condition.   Critical Care Total time providing critical care: 35 minutes     Final diagnoses:  COPD exacerbation (HCC)  Atrial fibrillation with rapid ventricular response [facility])  Hypoxia    ED Discharge Orders          Ordered    Amb referral to AFIB Clinic        01/14/24 [address] DO 01/14/24 1516

## 2024-01-15 DIAGNOSIS — I4891 Unspecified atrial fibrillation: Secondary | ICD-10-CM | POA: Diagnosis not present

## 2024-01-15 LAB — PROCALCITONIN: Procalcitonin: <0.1 ng/mL

## 2024-01-15 LAB — HEMOGLOBIN A1C
Hgb A1c MFr Bld: 7.7 % — ABNORMAL HIGH (ref 4.8–5.6)
Mean Plasma Glucose: 174.29 mg/dL

## 2024-01-15 LAB — BASIC METABOLIC PANEL WITH GFR
Anion gap: 9 (ref 5–15)
Anion gap: 12 (ref 5–15)
BUN: 38 mg/dL — ABNORMAL HIGH (ref 8–23)
BUN: 34 mg/dL — ABNORMAL HIGH (ref 8–23)
CO2: 32 mmol/L (ref 22–32)
CO2: 29 mmol/L (ref 22–32)
Calcium: 8.7 mg/dL — ABNORMAL LOW (ref 8.9–10.3)
Calcium: 8.7 mg/dL — ABNORMAL LOW (ref 8.9–10.3)
Chloride: 94 mmol/L — ABNORMAL LOW (ref 98–111)
Chloride: 93 mmol/L — ABNORMAL LOW (ref 98–111)
Creatinine, Ser: 1.22 mg/dL — ABNORMAL HIGH (ref 0.44–1.00)
Creatinine, Ser: 1.16 mg/dL — ABNORMAL HIGH (ref 0.44–1.00)
GFR, Estimated: 47 mL/min — ABNORMAL LOW (ref >60)
GFR, Estimated: 50 mL/min — ABNORMAL LOW (ref >60)
Glucose, Bld: 380 mg/dL — ABNORMAL HIGH (ref 70–99)
Glucose, Bld: 430 mg/dL — ABNORMAL HIGH (ref 70–99)
Potassium: 5.2 mmol/L — ABNORMAL HIGH (ref 3.5–5.1)
Potassium: 5.2 mmol/L — ABNORMAL HIGH (ref 3.5–5.1)
Sodium: 135 mmol/L (ref 135–145)
Sodium: 133 mmol/L — ABNORMAL LOW (ref 135–145)

## 2024-01-15 LAB — GLUCOSE, CAPILLARY
Glucose-Capillary: 292 mg/dL — ABNORMAL HIGH (ref 70–99)
Glucose-Capillary: 501 mg/dL (ref 70–99)
Glucose-Capillary: 330 mg/dL — ABNORMAL HIGH (ref 70–99)
Glucose-Capillary: 181 mg/dL — ABNORMAL HIGH (ref 70–99)
Glucose-Capillary: 145 mg/dL — ABNORMAL HIGH (ref 70–99)

## 2024-01-15 LAB — CBC
HCT: 32.6 % — ABNORMAL LOW (ref 36.0–46.0)
Hemoglobin: 9.7 g/dL — ABNORMAL LOW (ref 12.0–15.0)
MCH: 27.9 pg (ref 26.0–34.0)
MCHC: 29.8 g/dL — ABNORMAL LOW (ref 30.0–36.0)
MCV: 93.7 fL (ref 80.0–100.0)
Platelets: 369 K/uL (ref 150–400)
RBC: 3.48 MIL/uL — ABNORMAL LOW (ref 3.87–5.11)
RDW: 16.2 % — ABNORMAL HIGH (ref 11.5–15.5)
WBC: 13.1 K/uL — ABNORMAL HIGH (ref 4.0–10.5)
nRBC: 0.2 % (ref 0.0–0.2)

## 2024-01-15 LAB — MAGNESIUM: Magnesium: 2.3 mg/dL (ref 1.7–2.4)

## 2024-01-15 LAB — TSH: TSH: 0.346 u[IU]/mL — ABNORMAL LOW (ref 0.350–4.500)

## 2024-01-15 MED ORDER — NICOTINE 21 MG/24HR TD PT24: 21.0000 mg | MEDICATED_PATCH | Freq: Every day | TRANSDERMAL | Status: DC | PRN

## 2024-01-15 MED ORDER — INSULIN ASPART 100 UNIT/ML IJ SOLN
3.0000 [IU] | Freq: Three times a day (TID) | INTRAMUSCULAR | Status: DC
  Administered 2024-01-15 – 2024-01-16 (×4): 3 [IU] via SUBCUTANEOUS

## 2024-01-15 MED ORDER — INSULIN ASPART 100 UNIT/ML IJ SOLN
3.0000 [IU] | Freq: Once | INTRAMUSCULAR | Status: AC
  Administered 2024-01-15: 3 [IU] via SUBCUTANEOUS

## 2024-01-15 MED ORDER — PRAVASTATIN SODIUM 40 MG PO TABS
40.0000 mg | ORAL_TABLET | Freq: Every day | ORAL | Status: DC
  Administered 2024-01-15 – 2024-01-19 (×5): 40 mg via ORAL
  Filled 2024-01-15 (×5): qty 1

## 2024-01-15 MED ORDER — SODIUM CHLORIDE 0.9 % IV BOLUS
500.0000 mL | Freq: Once | INTRAVENOUS | Status: AC
  Administered 2024-01-15: 500 mL via INTRAVENOUS

## 2024-01-15 MED ORDER — INSULIN GLARGINE-YFGN 100 UNIT/ML ~~LOC~~ SOLN
15.0000 [IU] | Freq: Every day | SUBCUTANEOUS | Status: DC
  Administered 2024-01-15 – 2024-01-20 (×6): 15 [IU] via SUBCUTANEOUS
  Filled 2024-01-15 (×6): qty 0.15

## 2024-01-15 MED ORDER — ONDANSETRON HCL 4 MG/2ML IJ SOLN
4.0000 mg | Freq: Once | INTRAMUSCULAR | Status: AC
  Administered 2024-01-15: 4 mg via INTRAVENOUS
  Filled 2024-01-15: qty 2

## 2024-01-15 MED ORDER — GUAIFENESIN ER 600 MG PO TB12
1200.0000 mg | ORAL_TABLET | Freq: Two times a day (BID) | ORAL | Status: DC
  Administered 2024-01-15 – 2024-01-20 (×11): 1200 mg via ORAL
  Filled 2024-01-15 (×11): qty 2

## 2024-01-15 MED ORDER — INSULIN ASPART 100 UNIT/ML IJ SOLN
0.0000 [IU] | Freq: Every day | INTRAMUSCULAR | Status: DC
  Administered 2024-01-17: 3 [IU] via SUBCUTANEOUS
  Administered 2024-01-19: 2 [IU] via SUBCUTANEOUS

## 2024-01-15 MED ORDER — SODIUM ZIRCONIUM CYCLOSILICATE 5 G PO PACK
5.0000 g | PACK | Freq: Once | ORAL | Status: AC
  Administered 2024-01-15: 5 g via ORAL
  Filled 2024-01-15: qty 1

## 2024-01-15 MED ORDER — OXYBUTYNIN CHLORIDE 5 MG PO TABS
5.0000 mg | ORAL_TABLET | Freq: Three times a day (TID) | ORAL | Status: DC
  Administered 2024-01-15 – 2024-01-20 (×15): 5 mg via ORAL
  Filled 2024-01-15 (×15): qty 1

## 2024-01-15 MED ORDER — DOXYCYCLINE HYCLATE 100 MG PO TABS
100.0000 mg | ORAL_TABLET | Freq: Two times a day (BID) | ORAL | Status: AC
Start: 2024-01-15 — End: 2024-01-19
  Administered 2024-01-15 – 2024-01-19 (×10): 100 mg via ORAL
  Filled 2024-01-15 (×11): qty 1

## 2024-01-15 MED ORDER — PANTOPRAZOLE SODIUM 40 MG IV SOLR
40.0000 mg | Freq: Two times a day (BID) | INTRAVENOUS | Status: DC
  Administered 2024-01-15 – 2024-01-18 (×7): 40 mg via INTRAVENOUS
  Filled 2024-01-15 (×7): qty 10

## 2024-01-15 MED ORDER — METHYLPREDNISOLONE SODIUM SUCC 40 MG IJ SOLR
40.0000 mg | INTRAMUSCULAR | Status: DC
  Administered 2024-01-16: 40 mg via INTRAVENOUS
  Filled 2024-01-15 (×2): qty 1

## 2024-01-15 MED ORDER — SODIUM CHLORIDE 0.9 % IV SOLN
2.0000 g | INTRAVENOUS | Status: DC
  Administered 2024-01-15 – 2024-01-20 (×6): 2 g via INTRAVENOUS
  Filled 2024-01-15 (×6): qty 20

## 2024-01-15 MED ORDER — INSULIN ASPART 100 UNIT/ML IJ SOLN
0.0000 [IU] | Freq: Three times a day (TID) | INTRAMUSCULAR | Status: DC
  Administered 2024-01-15: 8 [IU] via SUBCUTANEOUS
  Administered 2024-01-15: 3 [IU] via SUBCUTANEOUS
  Administered 2024-01-15: 15 [IU] via SUBCUTANEOUS
  Administered 2024-01-16 (×3): 3 [IU] via SUBCUTANEOUS
  Administered 2024-01-17: 5 [IU] via SUBCUTANEOUS
  Administered 2024-01-17: 8 [IU] via SUBCUTANEOUS
  Administered 2024-01-18: 15 [IU] via SUBCUTANEOUS
  Administered 2024-01-18: 5 [IU] via SUBCUTANEOUS
  Administered 2024-01-18: 11 [IU] via SUBCUTANEOUS
  Administered 2024-01-19: 8 [IU] via SUBCUTANEOUS
  Administered 2024-01-19: 5 [IU] via SUBCUTANEOUS
  Administered 2024-01-19: 11 [IU] via SUBCUTANEOUS
  Administered 2024-01-20: 5 [IU] via SUBCUTANEOUS

## 2024-01-15 MED ORDER — ARFORMOTEROL TARTRATE 15 MCG/2ML IN NEBU
15.0000 ug | INHALATION_SOLUTION | Freq: Two times a day (BID) | RESPIRATORY_TRACT | Status: DC
Start: 2024-01-15 — End: 2024-01-20
  Administered 2024-01-15 – 2024-01-20 (×11): 15 ug via RESPIRATORY_TRACT
  Filled 2024-01-15 (×11): qty 2

## 2024-01-15 MED ORDER — GABAPENTIN 300 MG PO CAPS
300.0000 mg | ORAL_CAPSULE | Freq: Every day | ORAL | Status: DC
  Administered 2024-01-15 – 2024-01-19 (×5): 300 mg via ORAL
  Filled 2024-01-15 (×5): qty 1

## 2024-01-15 NOTE — Progress Notes (Signed)
 Pt was sleeping well when IV abx was hung around 0240. Pt woke up and stated feeling ok. Around 0300, pt vomited a large amount into a water pitcher, filling approx 1/3 of the container. On call provider contacted and one time dose of Zofran  was ordered & administered. Pt had some snacks in the evening (peanut butter, graham crackers, and a ginger ale). After vomiting episode, pt reported feeling better and stated pt did not understand the cause of the sudden vomiting. Pt has no teeth at all(dentures at home) and reports experiencing occasional choking episodes. Will continue to monitor.

## 2024-01-15 NOTE — Inpatient Diabetes Management (Signed)
 Inpatient Diabetes Program Recommendations  AACE/ADA: New Consensus Statement on Inpatient Glycemic Control (2015)  Target Ranges:  Prepandial:   less than 140 mg/dL      Peak postprandial:   less than 180 mg/dL (1-2 hours)      Critically ill patients:  140 - 180 mg/dL   Lab Results  Component Value Date   GLUCAP 292 (H) 01/15/2024   HGBA1C 6.0 (H) 05/31/2023    Review of Glycemic Control  Diabetes history: DM2 Outpatient Diabetes medications: None Current orders for Inpatient glycemic control: Novolog  0-15 TID with meals and 0-5 HS On Solumedrol 40 Q12H  Inpatient Diabetes Program Recommendations:    Consider adding low dose basal insulin  - Lantus  8 units at bedtime  Add Novolog  3 units TID with meals if eating > 50% while on steroids  Need updated HgbA1C  Add CHO mod to 2 gram Na+ diet  Continue to follow.  Thank you. Shona Brandy, RD, LDN, CDCES Inpatient Diabetes Coordinator [phone]

## 2024-01-15 NOTE — Evaluation (Signed)
 Physical Therapy Evaluation Patient Details Name: Holly May MRN: [id number] DOB: 02/20/1951 Today's Date: 01/15/2024  History of Present Illness  Holly May is a 73 y.o. female admitted with Afib with RVR 01/14/24 with medical history significant of   lung cancer, COPD on home oxygen , A fib, dyspnea, depression, hypertension, type 2 diabetes who was sent from cancer center for evaluation of hypoxia.  Patient normally uses 2 L/min of oxygen  at home and in their office she was satting in the 80s.  Patient has history of atrial fibrillation as well and was tachycardic in the office.  Clinical Impression  Pt admitted with above diagnosis. Pt in bed, agreeable to session. She states that she lives with her son who works nontraditional hours and pt is in bed most of the time when he is working. Pt reports that she has 4WW at home and is on 2L of O2 at baseline. She is able to complete 428ft with RW and SBA-supervision A due to fatigue with this distance and difficulty negotiating obstacles. Pt O2 96% upin return to room HR 88bpm. Pt currently with functional limitations due to the deficits listed below (see PT Problem List). Pt will benefit from acute skilled PT to increase their independence and safety with mobility to allow discharge.           If plan is discharge home, recommend the following: A little help with walking and/or transfers;A little help with bathing/dressing/bathroom;Assistance with cooking/housework;Help with stairs or ramp for entrance;Assist for transportation   Can travel by private vehicle        Equipment Recommendations Rolling walker (2 wheels)  Recommendations for Other Services       Functional Status Assessment Patient has had a recent decline in their functional status and demonstrates the ability to make significant improvements in function in a reasonable and predictable amount of time.     Precautions / Restrictions Precautions Precautions: Fall Recall  of Precautions/Restrictions: Intact Restrictions Weight Bearing Restrictions Per Provider Order: No      Mobility  Bed Mobility Overal bed mobility: Modified Independent             General bed mobility comments: inc time    Transfers Overall transfer level: Modified independent                 General transfer comment: RW    Ambulation/Gait Ambulation/Gait assistance: Supervision Gait Distance (Feet): 400 Feet Assistive device: Rolling walker (2 wheels) Gait Pattern/deviations: Step-through pattern, Decreased stride length, Drifts right/left Gait velocity: dec     General Gait Details: Pt able to demonstrate reciprocal pattern with RW and sup A with some noted difficulty negotiating obstacles in the hallway, intermittent R/L drift but no noted LOB. able to complete directional changes well with IV pole and O2 tank in tandem  Stairs Stairs:  (not formally assessed, however pt demonstrates the strength and stability to enter and exit home per setup with assist as done before admission)          Wheelchair Mobility     Tilt Bed    Modified Rankin (Stroke Patients Only)       Balance Overall balance assessment: Needs assistance Sitting-balance support: Feet supported, No upper extremity supported Sitting balance-Leahy Scale: Normal     Standing balance support: During functional activity Standing balance-Leahy Scale: Good  Pertinent Vitals/Pain Pain Assessment Pain Assessment: No/denies pain    Home Living Family/patient expects to be discharged to:: Private residence Living Arrangements: Children Available Help at Discharge: Family (son works and is not always able to be home) Type of Home: House Home Access: Stairs to enter Entrance Stairs-Rails: Right Entrance Stairs-Number of Steps: 3   Home Layout: One level Home Equipment: Cane - single Acupuncturist (4 wheels)       Prior Function Prior Level of Function : Driving;Needs assist       Physical Assist : Mobility (physical) Mobility (physical): Gait;Stairs   Mobility Comments: Pt would have help from her son with appointments and with mobility, when he is not home, she reports staying in bed ADLs Comments: independent     Extremity/Trunk Assessment   Upper Extremity Assessment Upper Extremity Assessment: Defer to OT evaluation    Lower Extremity Assessment Lower Extremity Assessment: Generalized weakness       Communication   Communication Communication: Impaired Factors Affecting Communication: Reduced clarity of speech    Cognition Arousal: Alert Behavior During Therapy: WFL for tasks assessed/performed   PT - Cognitive impairments: No apparent impairments                         Following commands: Intact       Cueing Cueing Techniques: Verbal cues, Gestural cues     General Comments      Exercises     Assessment/Plan    PT Assessment Patient needs continued PT services  PT Problem List Decreased strength;Decreased balance;Decreased activity tolerance;Cardiopulmonary status limiting activity       PT Treatment Interventions DME instruction;Functional mobility training;Balance training;Patient/family education;Gait training;Therapeutic activities;Stair training;Therapeutic exercise    PT Goals (Current goals can be found in the Care Plan section)  Acute Rehab PT Goals Patient Stated Goal: Return home and work on improving activity tolerance PT Goal Formulation: With patient Time For Goal Achievement: 01/29/24 Potential to Achieve Goals: Good    Frequency Min 2X/week     Co-evaluation               AM-PAC PT 6 Clicks Mobility  Outcome Measure Help needed turning from your back to your side while in a flat bed without using bedrails?: None Help needed moving from lying on your back to sitting on the side of a flat bed without using bedrails?:  None Help needed moving to and from a bed to a chair (including a wheelchair)?: None Help needed standing up from a chair using your arms (e.g., wheelchair or bedside chair)?: None Help needed to walk in hospital room?: A Little Help needed climbing 3-5 steps with a railing? : A Little 6 Click Score: 22    End of Session Equipment Utilized During Treatment: Gait belt;Oxygen  (2L via [place]) Activity Tolerance: Patient tolerated treatment well;Patient limited by fatigue Patient left: in chair;with nursing/sitter in room;with call bell/phone within reach Nurse Communication: Mobility status PT Visit Diagnosis: Muscle weakness (generalized) (M62.81);Difficulty in walking, not elsewhere classified (R26.2)    Time: 8569-8491 PT Time Calculation (min) (ACUTE ONLY): 38 min   Charges:   PT Evaluation $PT Eval Low Complexity: 1 Low PT Treatments $Gait Training: 23-37 mins PT General Charges $$ ACUTE PT VISIT: 1 Visit         Stann, PT Acute Rehabilitation Services Office: [phone] 01/15/2024   Stann DELENA Ohara 01/15/2024, 3:25 PM

## 2024-01-15 NOTE — Progress Notes (Signed)
 PROGRESS NOTE    Holly May  FMW:[id number] DOB: 04-07-1950 DOA: 01/14/2024 PCP: Katrinka Aquas, MD   Brief Narrative: 73 year old with past medical history significant for lung cancer, COPD on home oxygen , A-fib, depression, hypertension, diabetes type 2 sent from cancer center for evaluation of hypoxia.  Patient uses 2 L of oxygen  at home in the office she was seen noted to have oxygen  saturation in the 80s.  Patient has a history of A-fib she was noted to be tachycardic in the office as well.  She was treated outside hospital for pneumonia recently and discharged on Friday.  She is still taking his steroids.  Evaluation in the ED she received breathing treatment and IV steroid for wheezing.  Started on Cardizem  drip.  proBNP noted to be 3000 troponin 21.  Chest x-ray negative for acute cardiopulmonary abnormality.   Patient admitted for COPD exacerbation and A-fib with RVR  Assessment & Plan:   Principal Problem:   Atrial fibrillation with RVR (HCC)  1-A-fib with RVR -Suspect exacerbated by lung issues.  -Continue with Cardizem  gtt, transition to oral when no more vomiting.  - Continue with Eliquis   COPD exacerbation - Start guaifenesin  -Will start ceftriaxone  and doxycycline  -Start Brovana  and Pulmicort  -Continue DuoNeb - Continue IV fluid, was changed to daily -Counseling in regards to smoking cessation  Hyperglycemia - Suspect in the setting of a steroid - Check A1c - Worsening hyperglycemia this afternoon, plan to check Bmet  and give IV bolus.  Start long-acting and will add meal coverage.  Change diet to carb modified  Hyperkalemia:  Will give a dose of Lokelma  Lung cancer, left upper lobe adenocarcinoma.  - Continue follow-up outpatient  Hypertension - Continue with Cardizem   Hyperlipidemia - Resume statin  CKD stage III - Monitor renal function  Chronic heart failure preserved ejection fraction - Will resume diuretics tomorrow, she was vomiting  this morning and now has hyperglycemia  Low TSH: Will check free T3 free T4  Estimated body mass index is 25.28 kg/m as calculated from the following:   Height as of this encounter: 5' 7 (1.702 m).   Weight as of this encounter: 73.2 kg.   DVT prophylaxis: Eliquis  Code Status: Full code Family Communication: Care discussed with patient Disposition Plan:  Status is: Inpatient Remains inpatient appropriate because: Management of COPD exacerbation    Consultants:  None  Procedures:  None  Antimicrobials:    Subjective: She vomited this morning, abdominal pain, denies BM, she is passing gas.  She reports cough, still having shortness of breath.  We talked about smoking cessation   Objective: Vitals:   01/15/24 0426 01/15/24 0500 01/15/24 0737 01/15/24 0751  BP: (!) 127/90   (!) 149/97  Pulse: 82   71  Resp: 16   20  Temp: 98.2 F (36.8 C)   97.6 F (36.4 C)  TempSrc: Oral   Oral  SpO2: 98%  96% 92%  Weight:  73.2 kg    Height:        Intake/Output Summary (Last 24 hours) at 01/15/2024 0803 Last data filed at 01/15/2024 0400 Gross per 24 hour  Intake 626.99 ml  Output --  Net 626.99 ml   Filed Weights   01/14/24 1028 01/15/24 0500  Weight: 73.5 kg 73.2 kg    Examination:  General exam: Appears calm and comfortable  Respiratory system: Bilateral rhonchorous and wheezy. Respiratory effort normal. Cardiovascular system: S1 & S2 heard, RRR. No JVD, murmurs, rubs, gallops or clicks. No  pedal edema. Gastrointestinal system: Abdomen is nondistended, soft and nontender. No organomegaly or masses felt. Normal bowel sounds heard. Central nervous system: Alert and oriented. No focal neurological deficits. Extremities: Symmetric 5 x 5 power.   Data Reviewed: I have personally reviewed following labs and imaging studies  CBC: Recent Labs  Lab 01/14/24 1044 01/15/24 0524  WBC 15.2* 13.1*  NEUTROABS 12.1*  --   HGB 10.5* 9.7*  HCT 35.8* 32.6*  MCV 94.5 93.7   PLT 410* 369   Basic Metabolic Panel: Recent Labs  Lab 01/14/24 1044 01/15/24 0524  NA 140 135  K 5.0 5.2*  CL 98 94*  CO2 34* 32  GLUCOSE 207* 380*  BUN 35* 38*  CREATININE 1.47* 1.22*  CALCIUM 9.2 8.7*  MG  --  2.3   GFR: Estimated Creatinine Clearance: 41.1 mL/min (A) (by C-G formula based on SCr of 1.22 mg/dL (H)). Liver Function Tests: No results for input(s): AST, ALT, ALKPHOS, BILITOT, PROT, ALBUMIN  in the last 168 hours. No results for input(s): LIPASE, AMYLASE in the last 168 hours. No results for input(s): AMMONIA in the last 168 hours. Coagulation Profile: No results for input(s): INR, PROTIME in the last 168 hours. Cardiac Enzymes: No results for input(s): CKTOTAL, CKMB, CKMBINDEX, TROPONINI in the last 168 hours. BNP (last 3 results) Recent Labs    01/14/24 1044  PROBNP 3,687.0*   HbA1C: No results for input(s): HGBA1C in the last 72 hours. CBG: No results for input(s): GLUCAP in the last 168 hours. Lipid Profile: No results for input(s): CHOL, HDL, LDLCALC, TRIG, CHOLHDL, LDLDIRECT in the last 72 hours. Thyroid  Function Tests: Recent Labs    01/15/24 0524  TSH 0.346*   Anemia Panel: No results for input(s): VITAMINB12, FOLATE, FERRITIN, TIBC, IRON , RETICCTPCT in the last 72 hours. Sepsis Labs: No results for input(s): PROCALCITON, LATICACIDVEN in the last 168 hours.  Recent Results (from the past 240 hours)  Resp panel by RT-PCR (RSV, Flu A&B, Covid) Anterior Nasal Swab     Status: None   Collection Time: 01/14/24 12:22 PM   Specimen: Anterior Nasal Swab  Result Value Ref Range Status   SARS Coronavirus 2 by RT PCR NEGATIVE NEGATIVE Final    Comment: (NOTE) SARS-CoV-2 target nucleic acids are NOT DETECTED.  The SARS-CoV-2 RNA is generally detectable in upper respiratory specimens during the acute phase of infection. The lowest concentration of SARS-CoV-2 viral copies this assay  can detect is 138 copies/mL. A negative result does not preclude SARS-Cov-2 infection and should not be used as the sole basis for treatment or other patient management decisions. A negative result may occur with  improper specimen collection/handling, submission of specimen other than nasopharyngeal swab, presence of viral mutation(s) within the areas targeted by this assay, and inadequate number of viral copies(<138 copies/mL). A negative result must be combined with clinical observations, patient history, and epidemiological information. The expected result is Negative.  Fact Sheet for Patients:  BloggerCourse.com  Fact Sheet for Healthcare Providers:  SeriousBroker.it  This test is no t yet approved or cleared by the [place]  FDA and  has been authorized for detection and/or diagnosis of SARS-CoV-2 by FDA under an Emergency Use Authorization (EUA). This EUA will remain  in effect (meaning this test can be used) for the duration of the COVID-19 declaration under Section 564(b)(1) of the Act, 21 U.S.C.section 360bbb-3(b)(1), unless the authorization is terminated  or revoked sooner.       Influenza A by PCR NEGATIVE NEGATIVE Final  Influenza B by PCR NEGATIVE NEGATIVE Final    Comment: (NOTE) The Xpert Xpress SARS-CoV-2/FLU/RSV plus assay is intended as an aid in the diagnosis of influenza from Nasopharyngeal swab specimens and should not be used as a sole basis for treatment. Nasal washings and aspirates are unacceptable for Xpert Xpress SARS-CoV-2/FLU/RSV testing.  Fact Sheet for Patients: BloggerCourse.com  Fact Sheet for Healthcare Providers: SeriousBroker.it  This test is not yet approved or cleared by the [place]  FDA and has been authorized for detection and/or diagnosis of SARS-CoV-2 by FDA under an Emergency Use Authorization (EUA). This EUA will  remain in effect (meaning this test can be used) for the duration of the COVID-19 declaration under Section 564(b)(1) of the Act, 21 U.S.C. section 360bbb-3(b)(1), unless the authorization is terminated or revoked.     Resp Syncytial Virus by PCR NEGATIVE NEGATIVE Final    Comment: (NOTE) Fact Sheet for Patients: BloggerCourse.com  Fact Sheet for Healthcare Providers: SeriousBroker.it  This test is not yet approved or cleared by the [place]  FDA and has been authorized for detection and/or diagnosis of SARS-CoV-2 by FDA under an Emergency Use Authorization (EUA). This EUA will remain in effect (meaning this test can be used) for the duration of the COVID-19 declaration under Section 564(b)(1) of the Act, 21 U.S.C. section 360bbb-3(b)(1), unless the authorization is terminated or revoked.  Performed at [facility], [address], [place], [place] [address]          Radiology Studies: DG Chest 1 View Result Date: 01/14/2024 EXAM: 1 VIEW(S) XRAY OF THE CHEST 01/14/2024 11:18:50 AM COMPARISON: 07/23/2023 CLINICAL HISTORY: sob. FINDINGS: LUNGS AND PLEURA: Probable left upper lobe scarring. No focal pulmonary opacity. No pulmonary edema. No pleural effusion. No pneumothorax. HEART AND MEDIASTINUM: Stable cardiomediastinal silhouette. Sternotomy wires are noted. BONES AND SOFT TISSUES: Probable old right rib fractures are noted. No acute osseous abnormality. IMPRESSION: 1. No acute cardiopulmonary abnormality. 2. Stable left upper lobe scarring. 3. Stable old right rib fractures. Electronically signed by: Lynwood Seip MD 01/14/2024 11:36 AM EDT RP Workstation: [id number]        Scheduled Meds:  apixaban   5 mg Oral BID   budesonide   0.25 mg Nebulization BID   ipratropium-albuterol   3 mL Nebulization Q6H   methylPREDNISolone  (SOLU-MEDROL ) injection  40 mg Intravenous Q12H   mirtazapine  15 mg Oral QHS   Continuous  Infusions:  ceFEPime (MAXIPIME) IV 2 g (01/15/24 0240)   diltiazem  (CARDIZEM ) infusion 12.5 mg/hr (01/15/24 0750)     LOS: 1 day    Time spent: 35 Minutes    Denelda Akerley A Lyndsie Wallman, MD [facility]   If 7PM-7AM, please contact night-coverage [email or web address]  01/15/2024, 8:03 AM

## 2024-01-16 DIAGNOSIS — I4891 Unspecified atrial fibrillation: Secondary | ICD-10-CM | POA: Diagnosis not present

## 2024-01-16 LAB — CBC
HCT: 33.9 % — ABNORMAL LOW (ref 36.0–46.0)
Hemoglobin: 10.2 g/dL — ABNORMAL LOW (ref 12.0–15.0)
MCH: 28.1 pg (ref 26.0–34.0)
MCHC: 30.1 g/dL (ref 30.0–36.0)
MCV: 93.4 fL (ref 80.0–100.0)
Platelets: 380 K/uL (ref 150–400)
RBC: 3.63 MIL/uL — ABNORMAL LOW (ref 3.87–5.11)
RDW: 16.4 % — ABNORMAL HIGH (ref 11.5–15.5)
WBC: 20.3 K/uL — ABNORMAL HIGH (ref 4.0–10.5)
nRBC: 0.2 % (ref 0.0–0.2)

## 2024-01-16 LAB — BASIC METABOLIC PANEL WITH GFR
Anion gap: 8 (ref 5–15)
BUN: 33 mg/dL — ABNORMAL HIGH (ref 8–23)
CO2: 32 mmol/L (ref 22–32)
Calcium: 9.3 mg/dL (ref 8.9–10.3)
Chloride: 97 mmol/L — ABNORMAL LOW (ref 98–111)
Creatinine, Ser: 1.01 mg/dL — ABNORMAL HIGH (ref 0.44–1.00)
GFR, Estimated: 59 mL/min — ABNORMAL LOW (ref >60)
Glucose, Bld: 189 mg/dL — ABNORMAL HIGH (ref 70–99)
Potassium: 5.1 mmol/L (ref 3.5–5.1)
Sodium: 136 mmol/L (ref 135–145)

## 2024-01-16 LAB — GLUCOSE, CAPILLARY
Glucose-Capillary: 187 mg/dL — ABNORMAL HIGH (ref 70–99)
Glucose-Capillary: 157 mg/dL — ABNORMAL HIGH (ref 70–99)
Glucose-Capillary: 174 mg/dL — ABNORMAL HIGH (ref 70–99)
Glucose-Capillary: 199 mg/dL — ABNORMAL HIGH (ref 70–99)

## 2024-01-16 LAB — HEMOGLOBIN A1C
Hgb A1c MFr Bld: 8 % — ABNORMAL HIGH (ref 4.8–5.6)
Mean Plasma Glucose: 182.9 mg/dL

## 2024-01-16 LAB — T4, FREE: Free T4: 0.58 ng/dL — ABNORMAL LOW (ref 0.61–1.12)

## 2024-01-16 MED ORDER — BISOPROLOL FUMARATE 5 MG PO TABS
10.0000 mg | ORAL_TABLET | Freq: Every day | ORAL | Status: DC
  Administered 2024-01-16 – 2024-01-20 (×5): 10 mg via ORAL
  Filled 2024-01-16 (×5): qty 2

## 2024-01-16 MED ORDER — DILTIAZEM HCL 60 MG PO TABS
60.0000 mg | ORAL_TABLET | Freq: Four times a day (QID) | ORAL | Status: DC
  Administered 2024-01-16 – 2024-01-17 (×4): 60 mg via ORAL
  Filled 2024-01-16: qty 2
  Filled 2024-01-16: qty 1
  Filled 2024-01-16: qty 2
  Filled 2024-01-16: qty 1

## 2024-01-16 MED ADMIN — Sodium Zirconium Cyclosilicate For Susp Packet 5 GM: 5 g | ORAL | NDC 00310110501

## 2024-01-16 MED FILL — Sodium Zirconium Cyclosilicate For Susp Packet 5 GM: 5.0000 g | ORAL | Qty: 1 | Status: AC

## 2024-01-16 NOTE — Progress Notes (Signed)
 PROGRESS NOTE    Holly May  FMW:[id number] DOB: 01-25-1951 DOA: 01/14/2024 PCP: Katrinka Aquas, MD   Brief Narrative: 73 year old with past medical history significant for lung cancer, COPD on home oxygen , A-fib, depression, hypertension, diabetes type 2 sent from cancer center for evaluation of hypoxia.  Patient uses 2 L of oxygen  at home in the office she was seen noted to have oxygen  saturation in the 80s.  Patient has a history of A-fib she was noted to be tachycardic in the office as well.  She was treated outside hospital for pneumonia recently and discharged on Friday.  She is still taking his steroids.  Evaluation in the ED she received breathing treatment and IV steroid for wheezing.  Started on Cardizem  drip.  proBNP noted to be 3000 troponin 21.  Chest x-ray negative for acute cardiopulmonary abnormality.   Patient admitted for COPD exacerbation and A-fib with RVR  Assessment & Plan:   Principal Problem:   Atrial fibrillation with RVR (HCC)  1-A-fib with RVR -Suspect exacerbated by lung issues.  - Continue with Eliquis  Plan to resume bisoprolol . Also transition from cardizem  gtt to oral.   COPD exacerbation -Continue with  guaifenesin  -continue with ceftriaxone  and doxycycline , will treat for 5 days.  -Start Brovana  and Pulmicort  -Continue DuoNeb -Counseling in regards to smoking cessation -IV solumedrol.   Diabetes type 2  Hyperglycemia - Suspect in the setting of a steroid - A1c 8 She will need meds or insulin  at discharge Continue Lantus  and sliding scale and meal coverage. CBG better control  Hyperkalemia:  Repeat dose of Lokelma  Lung cancer, left upper lobe adenocarcinoma.  - Continue follow-up outpatient  Hypertension - Continue with Cardizem   Hyperlipidemia - Resume statin  CKD stage III - Monitor renal function Leukocytosis in setting steroids.  Chronic heart failure preserved ejection fraction - Will resume diuretics tomorrow, she was  vomiting this morning and now has hyperglycemia  Low TSH: Free T3 Pending free T4: 0.58.  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  She will need repeat TSH and free T4 in 4 weeks   Overreactive bladder; Oxybutynin .    Estimated body mass index is 26.03 kg/m as calculated from the following:   Height as of this encounter: 5' 7 (1.702 m).   Weight as of this encounter: 75.4 kg.   DVT prophylaxis: Eliquis  Code Status: Full code Family Communication: Care discussed with patient Disposition Plan:  Status is: Inpatient Remains inpatient appropriate because: Management of COPD exacerbation    Consultants:  None  Procedures:  None  Antimicrobials:    Subjective: She is breathing better, cough slightly improved. No further   vomiting. Objective: Vitals:   01/16/24 0500 01/16/24 0530 01/16/24 0543 01/16/24 0600  BP: (!) 138/91 (!) 141/98 (!) 141/98 (!) 156/76  Pulse: 97 74 89 89  Resp: 11 12 16 16   Temp:   98.1 F (36.7 C)   TempSrc:   Oral   SpO2: 98% 97% 98% 99%  Weight: 75.4 kg     Height:        Intake/Output Summary (Last 24 hours) at 01/16/2024 0756 Last data filed at 01/16/2024 0600 Gross per 24 hour  Intake 1383.34 ml  Output 650 ml  Net 733.34 ml   Filed Weights   01/14/24 1028 01/15/24 0500 01/16/24 0500  Weight: 73.5 kg 73.2 kg 75.4 kg    Examination:  General exam: No acute distress Respiratory system: Bilateral rhonchus, no wheezing  today Cardiovascular system: S1-S2 regular rhythm and rate Gastrointestinal system: Bowel sounds present, soft nontender nondistended Central nervous system: Alert conversant answering question.   Data Reviewed: I have personally reviewed following labs and imaging studies  CBC: Recent Labs  Lab 01/14/24 1044 01/15/24 0524 01/16/24 0513  WBC 15.2* 13.1* 20.3*  NEUTROABS 12.1*  --   --   HGB 10.5* 9.7* 10.2*  HCT 35.8* 32.6* 33.9*  MCV 94.5 93.7 93.4   PLT 410* 369 380   Basic Metabolic Panel: Recent Labs  Lab 01/14/24 1044 01/15/24 0524 01/15/24 1221 01/16/24 0513  NA 140 135 133* 136  K 5.0 5.2* 5.2* 5.1  CL 98 94* 93* 97*  CO2 34* 32 29 32  GLUCOSE 207* 380* 430* 189*  BUN 35* 38* 34* 33*  CREATININE 1.47* 1.22* 1.16* 1.01*  CALCIUM 9.2 8.7* 8.7* 9.3  MG  --  2.3  --   --    GFR: Estimated Creatinine Clearance: 54.1 mL/min (A) (by C-G formula based on SCr of 1.01 mg/dL (H)). Liver Function Tests: No results for input(s): AST, ALT, ALKPHOS, BILITOT, PROT, ALBUMIN  in the last 168 hours. No results for input(s): LIPASE, AMYLASE in the last 168 hours. No results for input(s): AMMONIA in the last 168 hours. Coagulation Profile: No results for input(s): INR, PROTIME in the last 168 hours. Cardiac Enzymes: No results for input(s): CKTOTAL, CKMB, CKMBINDEX, TROPONINI in the last 168 hours. BNP (last 3 results) Recent Labs    01/14/24 1044  PROBNP 3,687.0*   HbA1C: Recent Labs    01/15/24 0524  HGBA1C 7.7*   CBG: Recent Labs  Lab 01/15/24 1156 01/15/24 1422 01/15/24 1641 01/15/24 2124 01/16/24 0734  GLUCAP 501* 330* 181* 145* 187*   Lipid Profile: No results for input(s): CHOL, HDL, LDLCALC, TRIG, CHOLHDL, LDLDIRECT in the last 72 hours. Thyroid  Function Tests: Recent Labs    01/15/24 0524  TSH 0.346*   Anemia Panel: No results for input(s): VITAMINB12, FOLATE, FERRITIN, TIBC, IRON , RETICCTPCT in the last 72 hours. Sepsis Labs: Recent Labs  Lab 01/15/24 0524  PROCALCITON <0.10    Recent Results (from the past 240 hours)  Resp panel by RT-PCR (RSV, Flu A&B, Covid) Anterior Nasal Swab     Status: None   Collection Time: 01/14/24 12:22 PM   Specimen: Anterior Nasal Swab  Result Value Ref Range Status   SARS Coronavirus 2 by RT PCR NEGATIVE NEGATIVE Final    Comment: (NOTE) SARS-CoV-2 target nucleic acids are NOT DETECTED.  The SARS-CoV-2 RNA  is generally detectable in upper respiratory specimens during the acute phase of infection. The lowest concentration of SARS-CoV-2 viral copies this assay can detect is 138 copies/mL. A negative result does not preclude SARS-Cov-2 infection and should not be used as the sole basis for treatment or other patient management decisions. A negative result may occur with  improper specimen collection/handling, submission of specimen other than nasopharyngeal swab, presence of viral mutation(s) within the areas targeted by this assay, and inadequate number of viral copies(<138 copies/mL). A negative result must be combined with clinical observations, patient history, and epidemiological information. The expected result is Negative.  Fact Sheet for Patients:  BloggerCourse.com  Fact Sheet for Healthcare Providers:  SeriousBroker.it  This test is no t yet approved or cleared by the [place]  FDA and  has been authorized for detection and/or diagnosis of SARS-CoV-2 by FDA under an Emergency Use Authorization (EUA). This EUA will remain  in effect (meaning this test can be  used) for the duration of the COVID-19 declaration under Section 564(b)(1) of the Act, 21 U.S.C.section 360bbb-3(b)(1), unless the authorization is terminated  or revoked sooner.       Influenza A by PCR NEGATIVE NEGATIVE Final   Influenza B by PCR NEGATIVE NEGATIVE Final    Comment: (NOTE) The Xpert Xpress SARS-CoV-2/FLU/RSV plus assay is intended as an aid in the diagnosis of influenza from Nasopharyngeal swab specimens and should not be used as a sole basis for treatment. Nasal washings and aspirates are unacceptable for Xpert Xpress SARS-CoV-2/FLU/RSV testing.  Fact Sheet for Patients: BloggerCourse.com  Fact Sheet for Healthcare Providers: SeriousBroker.it  This test is not yet approved or cleared by the  [place]  FDA and has been authorized for detection and/or diagnosis of SARS-CoV-2 by FDA under an Emergency Use Authorization (EUA). This EUA will remain in effect (meaning this test can be used) for the duration of the COVID-19 declaration under Section 564(b)(1) of the Act, 21 U.S.C. section 360bbb-3(b)(1), unless the authorization is terminated or revoked.     Resp Syncytial Virus by PCR NEGATIVE NEGATIVE Final    Comment: (NOTE) Fact Sheet for Patients: BloggerCourse.com  Fact Sheet for Healthcare Providers: SeriousBroker.it  This test is not yet approved or cleared by the [place]  FDA and has been authorized for detection and/or diagnosis of SARS-CoV-2 by FDA under an Emergency Use Authorization (EUA). This EUA will remain in effect (meaning this test can be used) for the duration of the COVID-19 declaration under Section 564(b)(1) of the Act, 21 U.S.C. section 360bbb-3(b)(1), unless the authorization is terminated or revoked.  Performed at [facility], [address], [place], [place] [address]          Radiology Studies: DG Chest 1 View Result Date: 01/14/2024 EXAM: 1 VIEW(S) XRAY OF THE CHEST 01/14/2024 11:18:50 AM COMPARISON: 07/23/2023 CLINICAL HISTORY: sob. FINDINGS: LUNGS AND PLEURA: Probable left upper lobe scarring. No focal pulmonary opacity. No pulmonary edema. No pleural effusion. No pneumothorax. HEART AND MEDIASTINUM: Stable cardiomediastinal silhouette. Sternotomy wires are noted. BONES AND SOFT TISSUES: Probable old right rib fractures are noted. No acute osseous abnormality. IMPRESSION: 1. No acute cardiopulmonary abnormality. 2. Stable left upper lobe scarring. 3. Stable old right rib fractures. Electronically signed by: Lynwood Seip MD 01/14/2024 11:36 AM EDT RP Workstation: [id number]        Scheduled Meds:  apixaban   5 mg Oral BID   arformoterol   15 mcg Nebulization BID   budesonide   0.25  mg Nebulization BID   doxycycline   100 mg Oral Q12H   gabapentin   300 mg Oral QHS   guaiFENesin   1,200 mg Oral BID   insulin  aspart  0-15 Units Subcutaneous TID WC   insulin  aspart  0-5 Units Subcutaneous QHS   insulin  aspart  3 Units Subcutaneous TID WC   insulin  glargine-yfgn  15 Units Subcutaneous Daily   ipratropium-albuterol   3 mL Nebulization Q6H   methylPREDNISolone  (SOLU-MEDROL ) injection  40 mg Intravenous Q24H   mirtazapine  15 mg Oral QHS   oxybutynin   5 mg Oral TID   pantoprazole  (PROTONIX ) IV  40 mg Intravenous Q12H   pravastatin   40 mg Oral q1800   Continuous Infusions:  cefTRIAXone  (ROCEPHIN )  IV Stopped (01/15/24 1002)   diltiazem  (CARDIZEM ) infusion 12.5 mg/hr (01/16/24 0600)     LOS: 2 days    Time spent: 35 Minutes    Lam Mccubbins A Bjorn Hallas, MD [facility]   If 7PM-7AM, please contact night-coverage [email or web address]  01/16/2024, 7:56 AM

## 2024-01-16 NOTE — TOC Initial Note (Signed)
 Transition of Care [facility]) - Initial/Assessment Note   Patient Details  Name: Holly May MRN: [id number] Date of Birth: May 19, 1950  Transition [facility] CM/SW Contact:    Duwaine GORMAN Aran, LCSW Phone Number: 01/16/2024, 3:12 PM  Clinical Narrative: PT evaluation recommended HH and a rolling walker. CSW met with patient to discuss recommendations. Patient is agreeable to [facility] and a rolling walker. Patient also requested a POC. CSW made [facility] referral in hub. CSW followed up with Mitch with [employer] and confirmed the patient is already active with the company for home oxygen . DME orders placed for rolling walker and POC. [employer] to deliver walker to patient's room. Patient will be evaluated for POC after discharge. CSW updated patient. Patient confirmed she will also need a travel O2 tank at discharge. [employer] made aware.  Expected Discharge Plan: Home w Home Health Services Barriers to Discharge: Continued Medical Work up  Patient Goals and CMS Choice Patient states their goals for this hospitalization and ongoing recovery are:: Get POC CMS Medicare.gov Compare Post Acute Care list provided to:: Patient Choice offered to / list presented to : Patient  Expected Discharge Plan and Services In-house Referral: Clinical Social Work Post Acute Care Choice: Durable Medical Equipment, Home Health Living arrangements for the past 2 months: Single Family Home          DME Arranged: Walker rolling, Other see comment (POC) DME Agency: [employer] Date DME Agency Contacted: 01/16/24 Time DME Agency Contacted: [phone] Representative spoke with at DME Agency: Mitch  Prior Living Arrangements/Services Living arrangements for the past 2 months: Single Family Home Lives with:: Adult Children Patient language and need for interpreter reviewed:: Yes Do you feel safe going back to the place where you live?: Yes      Need for Family Participation in Patient Care: No (Comment) Care giver support system in place?:  Yes (comment) Criminal Activity/Legal Involvement Pertinent to Current Situation/Hospitalization: No - Comment as needed  Activities of Daily Living ADL Screening (condition at time of admission) Independently performs ADLs?: No Does the patient have a NEW difficulty with bathing/dressing/toileting/self-feeding that is expected to last >3 days?: No Does the patient have a NEW difficulty with getting in/out of bed, walking, or climbing stairs that is expected to last >3 days?: No Does the patient have a NEW difficulty with communication that is expected to last >3 days?: No Is the patient deaf or have difficulty hearing?: No Does the patient have difficulty seeing, even when wearing glasses/contacts?: No Does the patient have difficulty concentrating, remembering, or making decisions?: No  Permission Sought/Granted Permission sought to share information with : Other (comment) Permission granted to share information with : Yes, Verbal Permission Granted Permission granted to share info w AGENCY: HH agencies, DME company  Emotional Assessment Appearance:: Appears stated age Attitude/Demeanor/Rapport: Engaged Affect (typically observed): Accepting Orientation: : Oriented to Self, Oriented to Place, Oriented to  Time, Oriented to Situation Alcohol / Substance Use: Not Applicable Psych Involvement: No (comment)  Admission diagnosis:  Hypoxia [R09.02] COPD exacerbation (HCC) [J44.1] Atrial fibrillation with rapid ventricular response (HCC) [I48.91] Atrial fibrillation with RVR (HCC) [I48.91] Patient Active Problem List   Diagnosis Date Noted   Atrial fibrillation with RVR (HCC) 01/14/2024   Acute respiratory failure with hypoxia (HCC) 05/31/2023   Urinary tract infection 05/31/2023   Renal insufficiency 05/31/2023   History of lung cancer 05/31/2023   Left hip pain 05/31/2023   Fall at home, initial encounter 05/31/2023   Cardiac arrhythmia 02/11/2023  Malignant neoplasm of  unspecified part of left bronchus or lung (HCC) 02/11/2023   Anorexia 02/11/2023   PAF (paroxysmal atrial fibrillation) (HCC) 01/17/2023   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) 07/30/2022   Atrial tachycardia 07/17/2022   CAP (community acquired pneumonia) 07/17/2022   COPD exacerbation (HCC) 07/17/2022   Sepsis (HCC) 07/16/2022   Abnormal CT lung screening 07/09/2022   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   Hyperlipidemia 07/09/2022   Insomnia 07/09/2022   Leukocytosis 07/09/2022   Major depression, single episode 07/09/2022   Neurogenic dysfunction of the urinary bladder 07/09/2022   Osteoporosis 07/09/2022   Other specified disorders of kidney and ureter 07/09/2022   Nocturnal hypoxemia 07/09/2022   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   Vitamin D deficiency 07/09/2022   Wedge compression fracture of unspecified thoracic vertebra, initial encounter for closed fracture (HCC) 07/09/2022   Pulmonary nodule 06/20/2022   Abnormal PET of left lung 06/20/2022   GERD (gastroesophageal reflux disease) 04/16/2022   Multiple pulmonary nodules determined by computed tomography of lung 04/06/2022   History of thymoma 03/31/2019   Mediastinal mass 02/18/2019   Closed displaced fracture of proximal phalanx of left middle finger 04/10/2017   Acute on chronic respiratory failure (HCC) 06/12/2016   COPD GOLD 3/ group E  still smoking 06/12/2016   Elevated troponin 06/12/2016   Hypokalemia 06/12/2016   Acute renal failure (ARF) 06/12/2016   Preoperative cardiovascular examination 05/18/2014   Prolonged QT interval 05/18/2014   Humerus shaft fracture 04/06/2013   Closed fracture of part of upper end of humerus 04/06/2013   Syncope 03/31/2013   Fracture of left humerus 03/31/2013   DM type 2 (diabetes mellitus, type 2) (HCC) 03/31/2013   Essential hypertension 03/31/2013   Heme positive stool 03/31/2013   Normocytic anemia 03/31/2013   Cigarette smoker 03/31/2013   Fracture of thoracic spine  (HCC) 06/23/2011   Hemomediastinum 06/22/2011   Multiple rib fractures 06/22/2011   SDH (subdural hematoma) (HCC) 06/22/2011   PCP:  Katrinka Aquas, MD Pharmacy:   [facility] - [place], [place] - [address] [place] [address] Phone: [phone] Fax: [phone]  Social Drivers of Health (SDOH) Social History: SDOH Screenings   Food Insecurity: Food Insecurity Present (01/14/2024)  Housing: Low Risk  (01/14/2024)  Transportation Needs: No Transportation Needs (01/14/2024)  Utilities: Not At Risk (01/14/2024)  Alcohol Screen: Low Risk  (07/30/2022)  Depression (PHQ2-9): Low Risk  (01/14/2024)  Financial Resource Strain: Medium Risk (11/04/2023)   Received from [facility]  Social Connections: Moderately Isolated (01/14/2024)  Tobacco Use: High Risk (01/14/2024)   SDOH Interventions:    Readmission Risk Interventions     No data to display

## 2024-01-16 NOTE — Evaluation (Signed)
 Occupational Therapy Evaluation Patient Details Name: Holly May MRN: [id number] DOB: 06-23-1950 Today's Date: 01/16/2024   History of Present Illness   Holly May is a 73 y.o. female admitted with Afib with RVR 01/14/24 with medical history significant of   lung cancer, COPD on home oxygen , A fib, dyspnea, depression, hypertension, type 2 diabetes who was sent from cancer center for evaluation of hypoxia.  Patient normally uses 2 L/min of oxygen  at home and in their office she was satting in the 80s.  Patient has history of atrial fibrillation as well and was tachycardic in the office.     Clinical Impressions PTA, patient was mod I at home with son and on supplemental O2. Currently, patient presents with deficits outlined below (see OT Problem List for details) most significantly decreased activity tolerance, balance and generalized muscle weakness impacting BADL's and functional mobility (Supervision). Patient requires continued Acute care hospital level OT services to progress safety and functional performance and allow for discharge. Recommending HHOT with need for tub transfer bench for safe bathing at discharge.       If plan is discharge home, recommend the following:   A little help with walking and/or transfers;A little help with bathing/dressing/bathroom;Assistance with cooking/housework;Assist for transportation;Help with stairs or ramp for entrance     Functional Status Assessment   Patient has had a recent decline in their functional status and demonstrates the ability to make significant improvements in function in a reasonable and predictable amount of time.     Equipment Recommendations   Tub/shower bench      Precautions/Restrictions   Precautions Precautions: Fall Recall of Precautions/Restrictions: Intact Precaution/Restrictions Comments: watch HR Restrictions Weight Bearing Restrictions Per Provider Order: No     Mobility Bed  Mobility Overal bed mobility: Modified Independent             General bed mobility comments: inc time    Transfers Overall transfer level: Modified independent                 General transfer comment: RW      Balance Overall balance assessment: Needs assistance Sitting-balance support: Feet supported, No upper extremity supported Sitting balance-Leahy Scale: Normal     Standing balance support: During functional activity Standing balance-Leahy Scale: Good                             ADL either performed or assessed with clinical judgement   ADL Overall ADL's : Needs assistance/impaired Eating/Feeding: Independent;Sitting   Grooming: Set up;Wash/dry hands;Wash/dry face;Oral care;Sitting   Upper Body Bathing: Set up;Sitting   Lower Body Bathing: Sit to/from stand;Supervison/ safety   Upper Body Dressing : Set up;Sitting   Lower Body Dressing: Sit to/from stand;Supervision/safety   Toilet Transfer: Rolling walker (2 wheels);Supervision/safety   Toileting- Clothing Manipulation and Hygiene: Set up;Sitting/lateral lean       Functional mobility during ADLs: Supervision/safety;Rolling walker (2 wheels) General ADL Comments: cues for pacing and breathing     Vision Baseline Vision/History: 0 No visual deficits              Pertinent Vitals/Pain Pain Assessment Pain Assessment: No/denies pain     Extremity/Trunk Assessment Upper Extremity Assessment Upper Extremity Assessment: Right hand dominant;Generalized weakness   Lower Extremity Assessment Lower Extremity Assessment: Defer to PT evaluation   Cervical / Trunk Assessment Cervical / Trunk Assessment: Normal   Communication Communication Communication: Impaired Factors Affecting Communication: Reduced clarity  of speech   Cognition Arousal: Alert Behavior During Therapy: WFL for tasks assessed/performed Cognition: No apparent impairments                                Following commands: Intact       Cueing  General Comments   Cueing Techniques: Verbal cues;Gestural cues  SpO2 remained 100% on 2 ltrs via Fonda and HR 82-86 with ADL's and OOB   Exercises Exercises: Other exercises (encouraged and observed Fluter 10 reps)   Shoulder Instructions      Home Living Family/patient expects to be discharged to:: Private residence Living Arrangements: Children Available Help at Discharge: Family (son works and is not always able to be home) Type of Home: House Home Access: Stairs to enter Secretary/administrator of Steps: 3 Entrance Stairs-Rails: Right Home Layout: One level     Bathroom Shower/Tub: IT trainer: Standard Bathroom Accessibility: Yes How Accessible: Accessible via walker Home Equipment: Cane - single point;BSC/3in1;Shower seat;Rollator (4 wheels);Hand held shower head          Prior Functioning/Environment Prior Level of Function : Driving;Needs assist       Physical Assist : Mobility (physical) Mobility (physical): Gait;Stairs   Mobility Comments: Pt would have help from her son with appointments and with mobility, when he is not home, she reports staying in bed ADLs Comments: independent    OT Problem List: Decreased strength;Impaired balance (sitting and/or standing);Decreased activity tolerance;Cardiopulmonary status limiting activity;Obesity   OT Treatment/Interventions: Self-care/ADL training;Therapeutic exercise;Neuromuscular education;Energy conservation;DME and/or AE instruction;Therapeutic activities;Balance training;Patient/family education      OT Goals(Current goals can be found in the care plan section)   Acute Rehab OT Goals Patient Stated Goal: to go home soon OT Goal Formulation: With patient Time For Goal Achievement: 01/30/24 Potential to Achieve Goals: Good   OT Frequency:  Min 2X/week       AM-PAC OT 6 Clicks Daily Activity     Outcome Measure Help  from another person eating meals?: None Help from another person taking care of personal grooming?: None Help from another person toileting, which includes using toliet, bedpan, or urinal?: A Little Help from another person bathing (including washing, rinsing, drying)?: A Little Help from another person to put on and taking off regular upper body clothing?: None Help from another person to put on and taking off regular lower body clothing?: A Little 6 Click Score: 21   End of Session Equipment Utilized During Treatment: Gait belt;Rolling walker (2 wheels);Oxygen  Nurse Communication: Mobility status  Activity Tolerance: Patient tolerated treatment well Patient left: in chair;with call bell/phone within reach;with chair alarm set  OT Visit Diagnosis: Unsteadiness on feet (R26.81);Muscle weakness (generalized) (M62.81)                Time: 8564-8542 OT Time Calculation (min): 22 min Charges:  OT General Charges $OT Visit: 1 Visit OT Evaluation $OT Eval Low Complexity: 1 Low  Islay Polanco OT/L Acute Rehabilitation Department  [phone]  01/16/2024, 3:20 PM

## 2024-01-16 NOTE — Plan of Care (Signed)

## 2024-01-17 ENCOUNTER — Inpatient Hospital Stay (HOSPITAL_COMMUNITY)

## 2024-01-17 DIAGNOSIS — I4891 Unspecified atrial fibrillation: Secondary | ICD-10-CM | POA: Diagnosis not present

## 2024-01-17 DIAGNOSIS — J441 Chronic obstructive pulmonary disease with (acute) exacerbation: Secondary | ICD-10-CM | POA: Diagnosis not present

## 2024-01-17 LAB — BASIC METABOLIC PANEL WITH GFR
Anion gap: 7 (ref 5–15)
BUN: 37 mg/dL — ABNORMAL HIGH (ref 8–23)
CO2: 32 mmol/L (ref 22–32)
Calcium: 9.2 mg/dL (ref 8.9–10.3)
Chloride: 99 mmol/L (ref 98–111)
Creatinine, Ser: 0.95 mg/dL (ref 0.44–1.00)
GFR, Estimated: >60 mL/min (ref >60)
Glucose, Bld: 213 mg/dL — ABNORMAL HIGH (ref 70–99)
Potassium: 4.5 mmol/L (ref 3.5–5.1)
Sodium: 139 mmol/L (ref 135–145)

## 2024-01-17 LAB — CBC
HCT: 35.4 % — ABNORMAL LOW (ref 36.0–46.0)
Hemoglobin: 10.2 g/dL — ABNORMAL LOW (ref 12.0–15.0)
MCH: 26.5 pg (ref 26.0–34.0)
MCHC: 28.8 g/dL — ABNORMAL LOW (ref 30.0–36.0)
MCV: 91.9 fL (ref 80.0–100.0)
Platelets: 380 K/uL (ref 150–400)
RBC: 3.85 MIL/uL — ABNORMAL LOW (ref 3.87–5.11)
RDW: 16.2 % — ABNORMAL HIGH (ref 11.5–15.5)
WBC: 18.6 K/uL — ABNORMAL HIGH (ref 4.0–10.5)
nRBC: 0.1 % (ref 0.0–0.2)

## 2024-01-17 LAB — BLOOD GAS, ARTERIAL
Acid-Base Excess: 12.4 mmol/L — ABNORMAL HIGH (ref 0.0–2.0)
Bicarbonate: 39.9 mmol/L — ABNORMAL HIGH (ref 20.0–28.0)
Drawn by: 20012
FIO2: 28 %
O2 Content: 2 L/min
O2 Saturation: 95.7 %
Patient temperature: 36.8
pCO2 arterial: 62 mmHg — ABNORMAL HIGH (ref 32–48)
pH, Arterial: 7.41 (ref 7.35–7.45)
pO2, Arterial: 73 mmHg — ABNORMAL LOW (ref 83–108)

## 2024-01-17 LAB — GLUCOSE, CAPILLARY
Glucose-Capillary: 307 mg/dL — ABNORMAL HIGH (ref 70–99)
Glucose-Capillary: 202 mg/dL — ABNORMAL HIGH (ref 70–99)
Glucose-Capillary: 72 mg/dL (ref 70–99)
Glucose-Capillary: 84 mg/dL (ref 70–99)
Glucose-Capillary: 288 mg/dL — ABNORMAL HIGH (ref 70–99)
Glucose-Capillary: 273 mg/dL — ABNORMAL HIGH (ref 70–99)

## 2024-01-17 LAB — T3, FREE: T3, Free: 1.1 pg/mL — ABNORMAL LOW (ref 2.0–4.4)

## 2024-01-17 MED ORDER — HYDRALAZINE HCL 20 MG/ML IJ SOLN
10.0000 mg | Freq: Four times a day (QID) | INTRAMUSCULAR | Status: DC | PRN
  Administered 2024-01-17 – 2024-01-19 (×3): 10 mg via INTRAVENOUS
  Filled 2024-01-17 (×3): qty 1

## 2024-01-17 MED ORDER — LEVOTHYROXINE SODIUM 25 MCG PO TABS
25.0000 ug | ORAL_TABLET | Freq: Every day | ORAL | Status: DC
Start: 2024-01-17 — End: 2024-01-20
  Administered 2024-01-17 – 2024-01-20 (×4): 25 ug via ORAL
  Filled 2024-01-17 (×4): qty 1

## 2024-01-17 MED ORDER — DILTIAZEM HCL ER COATED BEADS 240 MG PO CP24: 240.0000 mg | ORAL_CAPSULE | Freq: Every day | ORAL | Status: DC

## 2024-01-17 MED ORDER — REVEFENACIN 175 MCG/3ML IN SOLN
175.0000 ug | Freq: Every day | RESPIRATORY_TRACT | Status: DC
  Administered 2024-01-17 – 2024-01-20 (×4): 175 ug via RESPIRATORY_TRACT
  Filled 2024-01-17 (×5): qty 3

## 2024-01-17 MED ORDER — DILTIAZEM HCL ER COATED BEADS 240 MG PO CP24
240.0000 mg | ORAL_CAPSULE | Freq: Every day | ORAL | Status: DC
  Administered 2024-01-17 – 2024-01-20 (×4): 240 mg via ORAL
  Filled 2024-01-17 (×4): qty 1

## 2024-01-17 MED ORDER — METOPROLOL TARTRATE 5 MG/5ML IV SOLN
2.5000 mg | Freq: Once | INTRAVENOUS | Status: AC
  Administered 2024-01-17: 2.5 mg via INTRAVENOUS
  Filled 2024-01-17: qty 5

## 2024-01-17 MED ORDER — METHYLPREDNISOLONE SODIUM SUCC 40 MG IJ SOLR
40.0000 mg | Freq: Two times a day (BID) | INTRAMUSCULAR | Status: DC
  Administered 2024-01-17 – 2024-01-19 (×5): 40 mg via INTRAVENOUS
  Filled 2024-01-17 (×4): qty 1

## 2024-01-17 MED ORDER — ALBUTEROL SULFATE (2.5 MG/3ML) 0.083% IN NEBU
2.5000 mg | INHALATION_SOLUTION | RESPIRATORY_TRACT | Status: DC | PRN
  Administered 2024-01-18: 2.5 mg via RESPIRATORY_TRACT
  Filled 2024-01-17: qty 3

## 2024-01-17 MED ORDER — FUROSEMIDE 10 MG/ML IJ SOLN
20.0000 mg | Freq: Every day | INTRAMUSCULAR | Status: DC
  Administered 2024-01-17 – 2024-01-19 (×3): 20 mg via INTRAVENOUS
  Filled 2024-01-17 (×3): qty 2

## 2024-01-17 MED ORDER — INSULIN ASPART 100 UNIT/ML IJ SOLN
10.0000 [IU] | Freq: Once | INTRAMUSCULAR | Status: AC
  Administered 2024-01-17: 10 [IU] via SUBCUTANEOUS

## 2024-01-17 MED ORDER — INSULIN ASPART 100 UNIT/ML IJ SOLN
4.0000 [IU] | Freq: Three times a day (TID) | INTRAMUSCULAR | Status: DC
  Administered 2024-01-18 – 2024-01-20 (×7): 4 [IU] via SUBCUTANEOUS

## 2024-01-17 MED ORDER — BISACODYL 10 MG RE SUPP
10.0000 mg | Freq: Once | RECTAL | Status: AC
  Administered 2024-01-17: 10 mg via RECTAL
  Filled 2024-01-17: qty 1

## 2024-01-17 MED ORDER — MAGNESIUM SULFATE 2 GM/50ML IV SOLN
2.0000 g | Freq: Once | INTRAVENOUS | Status: AC
  Administered 2024-01-17: 2 g via INTRAVENOUS
  Filled 2024-01-17: qty 50

## 2024-01-17 NOTE — Plan of Care (Signed)

## 2024-01-17 NOTE — Progress Notes (Signed)
 PROGRESS NOTE    Holly May  FMW:[id number] DOB: [birth year] DOA: 01/14/2024 PCP: Katrinka Aquas, MD   Brief Narrative: 73 year old with past medical history significant for lung cancer, COPD on home oxygen , A-fib, depression, hypertension, diabetes type 2 sent from cancer center for evaluation of hypoxia.  Patient uses 2 L of oxygen  at home in the office she was seen noted to have oxygen  saturation in the 80s.  Patient has a history of A-fib she was noted to be tachycardic in the office as well.  She was treated outside hospital for pneumonia recently and discharged on Friday.  She is still taking his steroids.  Evaluation in the ED she received breathing treatment and IV steroid for wheezing.  Started on Cardizem  drip.  proBNP noted to be 3000 troponin 21.  Chest x-ray negative for acute cardiopulmonary abnormality.   Patient admitted for COPD exacerbation and A-fib with RVR  Assessment & Plan:   Principal Problem:   Atrial fibrillation with RVR (HCC)  1-A-fib with RVR -Suspect exacerbated by lung issues.  - Continue with Eliquis  -now on oral Cardizem , bisoprolol /   COPD exacerbation -Continue with  guaifenesin  -Continue with ceftriaxone  and doxycycline , will treat for 5 days.  -Continue with  Brovana  and Pulmicort  -Continue DuoNeb -Counseling in regards to smoking cessation -IV solumedrol--change to BID.  She is more tachypnea, extra breathing tx, chest x ray showed right lower lobe consolidation, incentive spirometry, respiratory toilet. Will ask CCM to evaluate patient. ABG compensated.    Diabetes type 2  Hyperglycemia - Suspect in the setting of a steroid - A1c 8 She will need meds or insulin  at discharge Continue Lantus  and sliding scale and meal coverage. CBG better control  Hyperkalemia:  Repeat dose of Lokelma  Lung cancer, left upper lobe adenocarcinoma.  - Continue follow-up outpatient  Hypertension - Continue with Cardizem   Hyperlipidemia - Resume  statin  CKD stage III - Monitor renal function  Leukocytosis in setting steroids.  Chronic heart failure preserved ejection fraction - resume lasix .   Low TSH: Free T3 Pending free T4: 0.58.  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  She will need repeat TSH and free T4 in 4 weeks   Overreactive bladder; Oxybutynin .    Estimated body mass index is 27.25 kg/m as calculated from the following:   Height as of this encounter: 5' 7 (1.702 m).   Weight as of this encounter: 78.9 kg.   DVT prophylaxis: Eliquis  Code Status: Full code Family Communication: Care discussed with patient Disposition Plan:  Status is: Inpatient Remains inpatient appropriate because: Management of COPD exacerbation    Consultants:  None  Procedures:  None  Antimicrobials:    Subjective: She denies worsening dyspnea, but she is more tachypnea today and using abdominal muscle to breath.   Objective: Vitals:   01/17/24 0400 01/17/24 0500 01/17/24 0600 01/17/24 0827  BP:      Pulse:      Resp: 14 17 16    Temp:      TempSrc:      SpO2:    96%  Weight:  78.9 kg    Height:        Intake/Output Summary (Last 24 hours) at 01/17/2024 0953 Last data filed at 01/16/2024 2000 Gross per 24 hour  Intake 519.24 ml  Output 350 ml  Net 169.24 ml   Filed Weights   01/15/24 0500 01/16/24 0500 01/17/24 0500  Weight: 73.2 kg 75.4 kg 78.9  kg    Examination:  General exam:  Mild distress Respiratory system: BL wheezing, increase work of breathing.  Cardiovascular system: S1, S  2 RRR Gastrointestinal system: BS present, soft, nt Central nervous system: alert.   Data Reviewed: I have personally reviewed following labs and imaging studies  CBC: Recent Labs  Lab 01/14/24 1044 01/15/24 0524 01/16/24 0513 01/17/24 0759  WBC 15.2* 13.1* 20.3* 18.6*  NEUTROABS 12.1*  --   --   --   HGB 10.5* 9.7* 10.2* 10.2*  HCT 35.8* 32.6* 33.9* 35.4*   MCV 94.5 93.7 93.4 91.9  PLT 410* 369 380 380   Basic Metabolic Panel: Recent Labs  Lab 01/14/24 1044 01/15/24 0524 01/15/24 1221 01/16/24 0513 01/17/24 0759  NA 140 135 133* 136 139  K 5.0 5.2* 5.2* 5.1 4.5  CL 98 94* 93* 97* 99  CO2 34* 32 29 32 32  GLUCOSE 207* 380* 430* 189* 213*  BUN 35* 38* 34* 33* 37*  CREATININE 1.47* 1.22* 1.16* 1.01* 0.95  CALCIUM 9.2 8.7* 8.7* 9.3 9.2  MG  --  2.3  --   --   --    GFR: Estimated Creatinine Clearance: 58.7 mL/min (by C-G formula based on SCr of 0.95 mg/dL). Liver Function Tests: No results for input(s): AST, ALT, ALKPHOS, BILITOT, PROT, ALBUMIN  in the last 168 hours. No results for input(s): LIPASE, AMYLASE in the last 168 hours. No results for input(s): AMMONIA in the last 168 hours. Coagulation Profile: No results for input(s): INR, PROTIME in the last 168 hours. Cardiac Enzymes: No results for input(s): CKTOTAL, CKMB, CKMBINDEX, TROPONINI in the last 168 hours. BNP (last 3 results) Recent Labs    01/14/24 1044  PROBNP 3,687.0*   HbA1C: Recent Labs    01/15/24 0524 01/16/24 0513  HGBA1C 7.7* 8.0*   CBG: Recent Labs  Lab 01/16/24 1116 01/16/24 1641 01/16/24 2018 01/17/24 0502 01/17/24 0752  GLUCAP 157* 174* 199* 307* 202*   Lipid Profile: No results for input(s): CHOL, HDL, LDLCALC, TRIG, CHOLHDL, LDLDIRECT in the last 72 hours. Thyroid  Function Tests: Recent Labs    01/15/24 0524 01/16/24 0513  TSH 0.346*  --   FREET4  --  0.58*  T3FREE  --  1.1*   Anemia Panel: No results for input(s): VITAMINB12, FOLATE, FERRITIN, TIBC, IRON , RETICCTPCT in the last 72 hours. Sepsis Labs: Recent Labs  Lab 01/15/24 0524  PROCALCITON <0.10    Recent Results (from the past 240 hours)  Resp panel by RT-PCR (RSV, Flu A&B, Covid) Anterior Nasal Swab     Status: None   Collection Time: 01/14/24 12:22 PM   Specimen: Anterior Nasal Swab  Result Value Ref Range  Status   SARS Coronavirus 2 by RT PCR NEGATIVE NEGATIVE Final    Comment: (NOTE) SARS-CoV-2 target nucleic acids are NOT DETECTED.  The SARS-CoV-2 RNA is generally detectable in upper respiratory specimens during the acute phase of infection. The lowest concentration of SARS-CoV-2 viral copies this assay can detect is 138 copies/mL. A negative result does not preclude SARS-Cov-2 infection and should not be used as the sole basis for treatment or other patient management decisions. A negative result may occur with  improper specimen collection/handling, submission of specimen other than nasopharyngeal swab, presence of viral mutation(s) within the areas targeted by this assay, and inadequate number of viral copies(<138 copies/mL). A negative result must be combined with clinical observations, patient history, and epidemiological information. The expected result is Negative.  Fact Sheet for Patients:  BloggerCourse.com  Fact Sheet for Healthcare Providers:  SeriousBroker.it  This test is no t yet approved or cleared by the [place]  FDA and  has been authorized for detection and/or diagnosis of SARS-CoV-2 by FDA under an Emergency Use Authorization (EUA). This EUA will remain  in effect (meaning this test can be used) for the duration of the COVID-19 declaration under Section 564(b)(1) of the Act, 21 U.S.C.section 360bbb-3(b)(1), unless the authorization is terminated  or revoked sooner.       Influenza A by PCR NEGATIVE NEGATIVE Final   Influenza B by PCR NEGATIVE NEGATIVE Final    Comment: (NOTE) The Xpert Xpress SARS-CoV-2/FLU/RSV plus assay is intended as an aid in the diagnosis of influenza from Nasopharyngeal swab specimens and should not be used as a sole basis for treatment. Nasal washings and aspirates are unacceptable for Xpert Xpress SARS-CoV-2/FLU/RSV testing.  Fact Sheet for  Patients: BloggerCourse.com  Fact Sheet for Healthcare Providers: SeriousBroker.it  This test is not yet approved or cleared by the [place]  FDA and has been authorized for detection and/or diagnosis of SARS-CoV-2 by FDA under an Emergency Use Authorization (EUA). This EUA will remain in effect (meaning this test can be used) for the duration of the COVID-19 declaration under Section 564(b)(1) of the Act, 21 U.S.C. section 360bbb-3(b)(1), unless the authorization is terminated or revoked.     Resp Syncytial Virus by PCR NEGATIVE NEGATIVE Final    Comment: (NOTE) Fact Sheet for Patients: BloggerCourse.com  Fact Sheet for Healthcare Providers: SeriousBroker.it  This test is not yet approved or cleared by the [place]  FDA and has been authorized for detection and/or diagnosis of SARS-CoV-2 by FDA under an Emergency Use Authorization (EUA). This EUA will remain in effect (meaning this test can be used) for the duration of the COVID-19 declaration under Section 564(b)(1) of the Act, 21 U.S.C. section 360bbb-3(b)(1), unless the authorization is terminated or revoked.  Performed at [facility], [address], [place], [place] [address]          Radiology Studies: DG CHEST PORT 1 VIEW Result Date: 01/17/2024 EXAM: 1 VIEW XRAY OF THE CHEST 01/17/2024 09:16:00 AM COMPARISON: 01/14/2024 CLINICAL HISTORY: Increased SOB this morning. Hx of lung cancer, COPD on home oxygen , A-fib. FINDINGS: LUNGS AND PLEURA: Left upper lobe scarring. Interval development of right basilar collapse and consolidation, right-sided volume loss, and small right pleural effusion. HEART AND MEDIASTINUM: Cardiomegaly, stable. Atherosclerotic calcifications of aorta. BONES AND SOFT TISSUES: Old right rib fractures. Median sternotomy changes. IMPRESSION: 1. Interval development of right basilar collapse and  consolidation, right-sided volume loss, and small right pleural effusion. 2. Cardiomegaly, stable. Electronically signed by: Dorethia Molt MD 01/17/2024 09:28 AM EDT RP Workstation: [id number]        Scheduled Meds:  apixaban   5 mg Oral BID   arformoterol   15 mcg Nebulization BID   bisoprolol   10 mg Oral Daily   budesonide   0.25 mg Nebulization BID   diltiazem   240 mg Oral Daily   doxycycline   100 mg Oral Q12H   gabapentin   300 mg Oral QHS   guaiFENesin   1,200 mg Oral BID   insulin  aspart  0-15 Units Subcutaneous TID WC   insulin  aspart  0-5 Units Subcutaneous QHS   insulin  aspart  4 Units Subcutaneous TID WC   insulin  glargine-yfgn  15 Units Subcutaneous Daily   ipratropium-albuterol   3 mL Nebulization Q6H   levothyroxine  25 mcg Oral QAC breakfast   methylPREDNISolone  (SOLU-MEDROL ) injection  40  mg Intravenous Q12H   mirtazapine  15 mg Oral QHS   oxybutynin   5 mg Oral TID   pantoprazole  (PROTONIX ) IV  40 mg Intravenous Q12H   pravastatin   40 mg Oral q1800   Continuous Infusions:  cefTRIAXone  (ROCEPHIN )  IV 2 g (01/17/24 0917)   magnesium  sulfate bolus IVPB       LOS: 3 days    Time spent: 35 Minutes    Jenascia Bumpass A Yoanna Jurczyk, MD [facility]   If 7PM-7AM, please contact night-coverage [email or web address]  01/17/2024, 9:53 AM

## 2024-01-17 NOTE — Consult Note (Signed)
 NAME:  Holly May, MRN:  [id number], DOB:  [birth year], LOS: 3 ADMISSION DATE:  01/14/2024, CONSULTATION DATE:  01/17/24 REFERRING MD:  Owen Lore, MD CHIEF COMPLAINT:  COPD   History of Present Illness:  Holly May is a 73 year old woman with COPD, chronic hypoxemic respiratory failure, lung cancer and DMII who is admitted for COPD exacerbation.   PCCM consulted today for concern of increasing working of breathing. Chest radiograph showing new right lower lobe opacity vs atelectasis.   Review of imaging showed prior bronchial secretions noted in RLL on CT Chest scan from 08/2023. She had speech evaluation at that time for concern of dysphagia but no overt aspiration was noted. She has dentures but does not wear them when eating. She does report issues chewing her foot properly with no teeth.   At time of visit she was finishing up with lunch and not entirely sitting up well in bed. She does not report any increase in labor of her breathing. She does have mucous production with her cough.   Pertinent  Medical History   Past Medical History:  Diagnosis Date   Arthritis    Cancer (HCC)    lung   COPD (chronic obstructive pulmonary disease) (HCC)    Depression    Dyspnea    Essential hypertension, benign    Type 2 diabetes mellitus (HCC)    Urinary incontinence      Significant Hospital Events: Including procedures, antibiotic start and stop dates in addition to other pertinent events   10/15 admitted 10/18 PCCM Consulted  Interim History / Subjective:  As above  Objective    Blood pressure (!) 166/107, pulse 88, temperature 98.1 F (36.7 C), temperature source Oral, resp. rate 13, height 5' 7 (1.702 m), weight 78.9 kg, SpO2 99%.    FiO2 (%):  [28 %] 28 %   Intake/Output Summary (Last 24 hours) at 01/17/2024 1350 Last data filed at 01/17/2024 1148 Gross per 24 hour  Intake 879.24 ml  Output 350 ml  Net 529.24 ml   Filed Weights   01/15/24 0500 01/16/24 0500  01/17/24 0500  Weight: 73.2 kg 75.4 kg 78.9 kg    Examination: General: elderly woman, no distress, laying in bed HENT: [place]/AT, moist mucous membranes Lungs: expiratory wheezing, poor air movement Cardiovascular: rrr, no murmurs Abdomen: soft, non-tender, non-distended Extremities: warm, no edema Neuro: alert, moving all extremities GU: n/a  Resolved problem list   Assessment and Plan   COPD with Acute Exacerbation - continue solumedrol 40mg  IV BID - continue ceftriaxone  and doxycycline  - budesonide , brovana  and yupelri nebs - PRN albuterol  - flutter valve - Chest PT with vest for mucous clearance  Concern for Aspiration - change diet to soft, dysphagia 3 - consult speech for evaluation, consider MBS   Labs   CBC: Recent Labs  Lab 01/14/24 1044 01/15/24 0524 01/16/24 0513 01/17/24 0759  WBC 15.2* 13.1* 20.3* 18.6*  NEUTROABS 12.1*  --   --   --   HGB 10.5* 9.7* 10.2* 10.2*  HCT 35.8* 32.6* 33.9* 35.4*  MCV 94.5 93.7 93.4 91.9  PLT 410* 369 380 380    Basic Metabolic Panel: Recent Labs  Lab 01/14/24 1044 01/15/24 0524 01/15/24 1221 01/16/24 0513 01/17/24 0759  NA 140 135 133* 136 139  K 5.0 5.2* 5.2* 5.1 4.5  CL 98 94* 93* 97* 99  CO2 34* 32 29 32 32  GLUCOSE 207* 380* 430* 189* 213*  BUN 35* 38* 34* 33* 37*  CREATININE 1.47* 1.22* 1.16* 1.01* 0.95  CALCIUM 9.2 8.7* 8.7* 9.3 9.2  MG  --  2.3  --   --   --    GFR: Estimated Creatinine Clearance: 58.7 mL/min (by C-G formula based on SCr of 0.95 mg/dL). Recent Labs  Lab 01/14/24 1044 01/15/24 0524 01/16/24 0513 01/17/24 0759  PROCALCITON  --  <0.10  --   --   WBC 15.2* 13.1* 20.3* 18.6*    Liver Function Tests: No results for input(s): AST, ALT, ALKPHOS, BILITOT, PROT, ALBUMIN  in the last 168 hours. No results for input(s): LIPASE, AMYLASE in the last 168 hours. No results for input(s): AMMONIA in the last 168 hours.  ABG    Component Value Date/Time   PHART 7.41  01/17/2024 0850   PCO2ART 62 (H) 01/17/2024 0850   PO2ART 73 (L) 01/17/2024 0850   HCO3 39.9 (H) 01/17/2024 0850   TCO2 29 02/19/2019 0426   O2SAT 95.7 01/17/2024 0850     Coagulation Profile: No results for input(s): INR, PROTIME in the last 168 hours.  Cardiac Enzymes: No results for input(s): CKTOTAL, CKMB, CKMBINDEX, TROPONINI in the last 168 hours.  HbA1C: Hgb A1c MFr Bld  Date/Time Value Ref Range Status  01/16/2024 05:13 AM 8.0 (H) 4.8 - 5.6 % Final    Comment:    (NOTE) Diagnosis of Diabetes The following HbA1c ranges recommended by the American Diabetes Association (ADA) may be used as an aid in the diagnosis of diabetes mellitus.  Hemoglobin             Suggested A1C NGSP%              Diagnosis  <5.7                   Non Diabetic  5.7-6.4                Pre-Diabetic  >6.4                   Diabetic  <7.0                   Glycemic control for                       adults with diabetes.    01/15/2024 05:24 AM 7.7 (H) 4.8 - 5.6 % Final    Comment:    (NOTE) Diagnosis of Diabetes The following HbA1c ranges recommended by the American Diabetes Association (ADA) may be used as an aid in the diagnosis of diabetes mellitus.  Hemoglobin             Suggested A1C NGSP%              Diagnosis  <5.7                   Non Diabetic  5.7-6.4                Pre-Diabetic  >6.4                   Diabetic  <7.0                   Glycemic control for                       adults with diabetes.      CBG: Recent Labs  Lab 01/16/24 2018 01/17/24 0502 01/17/24 0752 01/17/24 1130  01/17/24 1142  GLUCAP 199* 307* 202* 84 72    Review of Systems:   Review of Systems  Constitutional:  Negative for chills and fever.  Respiratory:  Positive for cough, sputum production, shortness of breath and wheezing.   Cardiovascular:  Negative for chest pain and leg swelling.  Gastrointestinal:  Negative for abdominal pain and heartburn.     Past Medical  History:  She,  has a past medical history of Arthritis, Cancer (HCC), COPD (chronic obstructive pulmonary disease) (HCC), Depression, Dyspnea, Essential hypertension, benign, Type 2 diabetes mellitus (HCC), and Urinary incontinence.   Surgical History:   Past Surgical History:  Procedure Laterality Date   BRONCHIAL BIOPSY  07/09/2022   Procedure: BRONCHIAL BIOPSIES;  Surgeon: Brenna Adine CROME, DO;  Location: [facility];  Service: Pulmonary;;   BRONCHIAL BRUSHINGS  07/09/2022   Procedure: BRONCHIAL BRUSHINGS;  Surgeon: Brenna Adine CROME, DO;  Location: [facility];  Service: Pulmonary;;   BRONCHIAL NEEDLE ASPIRATION BIOPSY  07/09/2022   Procedure: BRONCHIAL NEEDLE ASPIRATION BIOPSIES;  Surgeon: Brenna Adine CROME, DO;  Location: [facility];  Service: Pulmonary;;   CHOLECYSTECTOMY     FIDUCIAL MARKER PLACEMENT  07/09/2022   Procedure: FIDUCIAL MARKER PLACEMENT;  Surgeon: Brenna Adine CROME, DO;  Location: [facility];  Service: Pulmonary;;   FRACTURE SURGERY Left    arm   OPEN REDUCTION INTERNAL FIXATION (ORIF) PROXIMAL PHALANX Left 04/11/2017   Procedure: OPEN REDUCTION INTERNAL FIXATION (ORIF) PROXIMAL PHALANX;  Surgeon: Sissy Cough, MD;  Location: [facility];  Service: Orthopedics;  Laterality: Left;   RESECTION OF MEDIASTINAL MASS N/A 02/18/2019   Procedure: RESECTION OF MEDIASTINAL MASS;  Surgeon: Fleeta Hanford Coy, MD;  Location[facility];  Service: Thoracic;  Laterality: N/A;   STERNOTOMY N/A 02/18/2019   Procedure: STERNOTOMY;  Surgeon: Fleeta Hanford Coy, MD;  Location: [facility] OR;  Service: Thoracic;  Laterality: N/A;   VIDEO BRONCHOSCOPY WITH ENDOBRONCHIAL ULTRASOUND Bilateral 07/09/2022   Procedure: VIDEO BRONCHOSCOPY WITH ENDOBRONCHIAL ULTRASOUND;  Surgeon: Brenna Adine CROME, DO;  Location: [facility];  Service: Pulmonary;  Laterality: Bilateral;     Social History:   reports that she has been smoking cigarettes. She started smoking about 53 years ago. She has a  79.5 pack-year smoking history. She has never used smokeless tobacco. She reports current alcohol use. She reports that she does not use drugs.   Family History:  Her family history includes Diabetes Mellitus II in her mother and sister; Hypertension in her brother and sister.   Allergies No Known Allergies   Home Medications  Prior to Admission medications   Medication Sig Start Date End Date Taking? Authorizing Provider  albuterol  (PROVENTIL  HFA;VENTOLIN  HFA) 108 (90 Base) MCG/ACT inhaler Inhale 2 puffs into the lungs every 4 (four) hours as needed for wheezing or shortness of breath (cough, shortness of breath or wheezing.). 02/18/18  Yes Shah, Pratik D, DO  amLODipine  (NORVASC ) 5 MG tablet Take 5 mg by mouth daily. 10/29/23  Yes [provider]  apixaban  (ELIQUIS ) 5 MG TABS tablet Take 1 tablet (5 mg total) by mouth 2 (two) times daily. 05/01/23  Yes Mallipeddi, Vishnu P, MD  bisoprolol  (ZEBETA ) 5 MG tablet Take 1 tablet (5 mg total) by mouth daily. Patient taking differently: Take 10 mg by mouth daily. 05/01/23  Yes Mallipeddi, Vishnu P, MD  budesonide  (PULMICORT ) 0.25 MG/2ML nebulizer solution One vial with each duoneb up to 4 x daily Patient taking differently: Take 0.25 mg by nebulization 4 (four) times daily as  needed (shortness of breath, wheezing). One vial with each duoneb up to 4 x daily 11/27/23  Yes Wert, Michael B, MD  citalopram  (CELEXA ) 40 MG tablet Take 40 mg by mouth daily.   Yes [provider]  furosemide  (LASIX ) 20 MG tablet TAKE ONE TABLET BY MOUTH TWICE DAILY; Duration: 30 12/24/23  Yes [provider]  gabapentin  (NEURONTIN ) 300 MG capsule Take 300 mg by mouth at bedtime.   Yes [provider]  ipratropium-albuterol  (DUONEB) 0.5-2.5 (3) MG/3ML SOLN Take 3 mLs by nebulization every 6 (six) hours as needed (wheezing, shortness of breath). 06/11/22  Yes [provider]  lovastatin (MEVACOR) 40 MG tablet Take 40 mg by mouth at  bedtime.   Yes [provider]  mirtazapine (REMERON) 15 MG tablet Take 15 mg by mouth at bedtime. For appetite   Yes [provider]  naltrexone (DEPADE) 50 MG tablet Take 25 mg by mouth daily. 11/07/23  Yes [provider]  olmesartan  (BENICAR ) 40 MG tablet Take 1 tablet (40 mg total) by mouth daily. 02/11/23 02/11/24 Yes Darlean Ozell NOVAK, MD  omeprazole (PRILOSEC) 20 MG capsule Take 20 mg by mouth daily before breakfast.   Yes [provider]  oxybutynin  (DITROPAN ) 5 MG tablet Take 5 mg by mouth 3 (three) times daily.   Yes [provider]  potassium chloride  SA (KLOR-CON  M) 20 MEQ tablet TAKE ONE TABLET BY MOUTH ONCE DAILY 11/06/23  Yes Katragadda, Sreedhar, MD  predniSONE  (DELTASONE ) 10 MG tablet red 20 until better then 10 mg daily x 5 d and off Patient taking differently: Take 10 mg by mouth See admin instructions. Take 4 tablets by mouth twice daily for 2 days; then take 2 tablets twice daily for 2 days; then 1 tablet twice daily for 2 days; then 0.5 tablet daily for 2 days. 01/12/24  Yes Darlean Ozell NOVAK, MD  torsemide (DEMADEX) 20 MG tablet Take 20 mg by mouth daily. 01/09/24  Yes [provider]  TRELEGY ELLIPTA 100-62.5-25 MCG/ACT AEPB Inhale 1 puff into the lungs daily. 07/17/23  Yes [provider]     Critical care time: n/a    Dorn Chill, MD [facility] Office: [phone]   See Amion for personal pager PCCM on call pager [phone] until 7pm. Please call Elink 7p-7a. [phone]

## 2024-01-18 ENCOUNTER — Telehealth: Payer: Self-pay | Admitting: Pulmonary Disease

## 2024-01-18 DIAGNOSIS — I4891 Unspecified atrial fibrillation: Secondary | ICD-10-CM | POA: Diagnosis not present

## 2024-01-18 DIAGNOSIS — J441 Chronic obstructive pulmonary disease with (acute) exacerbation: Secondary | ICD-10-CM | POA: Diagnosis not present

## 2024-01-18 LAB — CBC
HCT: 35.5 % — ABNORMAL LOW (ref 36.0–46.0)
Hemoglobin: 10.8 g/dL — ABNORMAL LOW (ref 12.0–15.0)
MCH: 27.5 pg (ref 26.0–34.0)
MCHC: 30.4 g/dL (ref 30.0–36.0)
MCV: 90.3 fL (ref 80.0–100.0)
Platelets: 366 K/uL (ref 150–400)
RBC: 3.93 MIL/uL (ref 3.87–5.11)
RDW: 16.5 % — ABNORMAL HIGH (ref 11.5–15.5)
WBC: 15.8 K/uL — ABNORMAL HIGH (ref 4.0–10.5)
nRBC: 0 % (ref 0.0–0.2)

## 2024-01-18 LAB — BASIC METABOLIC PANEL WITH GFR
Anion gap: 10 (ref 5–15)
BUN: 38 mg/dL — ABNORMAL HIGH (ref 8–23)
CO2: 32 mmol/L (ref 22–32)
Calcium: 9.4 mg/dL (ref 8.9–10.3)
Chloride: 98 mmol/L (ref 98–111)
Creatinine, Ser: 0.83 mg/dL (ref 0.44–1.00)
GFR, Estimated: >60 mL/min (ref >60)
Glucose, Bld: 192 mg/dL — ABNORMAL HIGH (ref 70–99)
Potassium: 4.4 mmol/L (ref 3.5–5.1)
Sodium: 140 mmol/L (ref 135–145)

## 2024-01-18 LAB — GLUCOSE, CAPILLARY
Glucose-Capillary: 219 mg/dL — ABNORMAL HIGH (ref 70–99)
Glucose-Capillary: 385 mg/dL — ABNORMAL HIGH (ref 70–99)
Glucose-Capillary: 307 mg/dL — ABNORMAL HIGH (ref 70–99)
Glucose-Capillary: 133 mg/dL — ABNORMAL HIGH (ref 70–99)

## 2024-01-18 LAB — EXPECTORATED SPUTUM ASSESSMENT W GRAM STAIN, RFLX TO RESP C

## 2024-01-18 MED ORDER — PANTOPRAZOLE SODIUM 40 MG PO TBEC
40.0000 mg | DELAYED_RELEASE_TABLET | Freq: Two times a day (BID) | ORAL | Status: DC
  Administered 2024-01-18 – 2024-01-20 (×4): 40 mg via ORAL
  Filled 2024-01-18 (×4): qty 1

## 2024-01-18 NOTE — Progress Notes (Signed)
 Mobility Specialist - Progress Note   01/18/24 1318  Mobility  Activity Ambulated with assistance  Level of Assistance Contact guard assist, steadying assist  Assistive Device Front wheel walker  Distance Ambulated (ft) 100 ft  Range of Motion/Exercises Active  Activity Response Tolerated well  Mobility Referral Yes  Mobility visit 1 Mobility  Mobility Specialist Start Time (ACUTE ONLY) 1318  Mobility Specialist Stop Time (ACUTE ONLY) 1331  Mobility Specialist Time Calculation (min) (ACUTE ONLY) 13 min   Received in chair requesting assistance to restroom. On 3L ambulated to restroom then into hall. Contact guard entire session, returned to chair with all needs met. Alarm on.  Cyndee Ada Mobility Specialist

## 2024-01-18 NOTE — Telephone Encounter (Signed)
 Please schedule patient for hospital follow up in 2-3 weeks with an APP.  Thanks, JD

## 2024-01-18 NOTE — Progress Notes (Signed)
 NAME:  Holly May, MRN:  [id number], DOB:  Jan 02, 1951, LOS: 4 ADMISSION DATE:  01/14/2024, CONSULTATION DATE:  01/17/24 REFERRING MD:  Owen Lore, MD CHIEF COMPLAINT:  COPD   History of Present Illness:  Holly May is a 73 year old woman with COPD, chronic hypoxemic respiratory failure, lung cancer and DMII who is admitted for COPD exacerbation.   PCCM consulted today for concern of increasing working of breathing. Chest radiograph showing new right lower lobe opacity vs atelectasis.   Review of imaging showed prior bronchial secretions noted in RLL on CT Chest scan from 08/2023. She had speech evaluation at that time for concern of dysphagia but no overt aspiration was noted. She has dentures but does not wear them when eating. She does report issues chewing her foot properly with no teeth.   At time of visit she was finishing up with lunch and not entirely sitting up well in bed. She does not report any increase in labor of her breathing. She does have mucous production with her cough.   Pertinent  Medical History   Past Medical History:  Diagnosis Date   Arthritis    Cancer (HCC)    lung   COPD (chronic obstructive pulmonary disease) (HCC)    Depression    Dyspnea    Essential hypertension, benign    Type 2 diabetes mellitus (HCC)    Urinary incontinence      Significant Hospital Events: Including procedures, antibiotic start and stop dates in addition to other pertinent events   10/15 admitted 10/18 PCCM Consulted  Interim History / Subjective:  No acute events overnight Tolerating chest PT with vest She feels less congested today and breathing feels better  Objective    Blood pressure (!) 156/86, pulse 94, temperature (!) 97.5 F (36.4 C), temperature source Oral, resp. rate 18, height 5' 7 (1.702 m), weight 75.3 kg, SpO2 93%.    FiO2 (%):  [21 %-32 %] 32 %   Intake/Output Summary (Last 24 hours) at 01/18/2024 1307 Last data filed at 01/18/2024  1138 Gross per 24 hour  Intake 530 ml  Output 1350 ml  Net -820 ml   Filed Weights   01/16/24 0500 01/17/24 0500 01/18/24 0500  Weight: 75.4 kg 78.9 kg 75.3 kg    Examination: General: elderly woman, no distress, laying in bed HENT: [place]/AT, moist mucous membranes Lungs: mild rhonchi on right Cardiovascular: rrr, no murmurs Abdomen: soft, non-tender, non-distended Extremities: warm, no edema Neuro: alert, moving all extremities GU: n/a  Resolved problem list   Assessment and Plan   COPD with Acute Exacerbation - continue solumedrol 40mg  IV BID - continue ceftriaxone  and doxycycline  - budesonide , brovana  and yupelri nebs - PRN albuterol  - flutter valve - Chest PT with vest for mucous clearance  Concern for Aspiration - change diet to soft, dysphagia 3 - consult speech for evaluation, consider MBS  Will arrange follow up in pulmonary clinic. PCCM will sign off.   Labs   CBC: Recent Labs  Lab 01/14/24 1044 01/15/24 0524 01/16/24 0513 01/17/24 0759 01/18/24 0519  WBC 15.2* 13.1* 20.3* 18.6* 15.8*  NEUTROABS 12.1*  --   --   --   --   HGB 10.5* 9.7* 10.2* 10.2* 10.8*  HCT 35.8* 32.6* 33.9* 35.4* 35.5*  MCV 94.5 93.7 93.4 91.9 90.3  PLT 410* 369 380 380 366    Basic Metabolic Panel: Recent Labs  Lab 01/15/24 0524 01/15/24 1221 01/16/24 0513 01/17/24 0759 01/18/24 0519  NA 135  133* 136 139 140  K 5.2* 5.2* 5.1 4.5 4.4  CL 94* 93* 97* 99 98  CO2 32 29 32 32 32  GLUCOSE 380* 430* 189* 213* 192*  BUN 38* 34* 33* 37* 38*  CREATININE 1.22* 1.16* 1.01* 0.95 0.83  CALCIUM 8.7* 8.7* 9.3 9.2 9.4  MG 2.3  --   --   --   --    GFR: Estimated Creatinine Clearance: 65.9 mL/min (by C-G formula based on SCr of 0.83 mg/dL). Recent Labs  Lab 01/15/24 0524 01/16/24 0513 01/17/24 0759 01/18/24 0519  PROCALCITON <0.10  --   --   --   WBC 13.1* 20.3* 18.6* 15.8*    Liver Function Tests: No results for input(s): AST, ALT, ALKPHOS, BILITOT, PROT,  ALBUMIN  in the last 168 hours. No results for input(s): LIPASE, AMYLASE in the last 168 hours. No results for input(s): AMMONIA in the last 168 hours.  ABG    Component Value Date/Time   PHART 7.41 01/17/2024 0850   PCO2ART 62 (H) 01/17/2024 0850   PO2ART 73 (L) 01/17/2024 0850   HCO3 39.9 (H) 01/17/2024 0850   TCO2 29 02/19/2019 0426   O2SAT 95.7 01/17/2024 0850     Coagulation Profile: No results for input(s): INR, PROTIME in the last 168 hours.  Cardiac Enzymes: No results for input(s): CKTOTAL, CKMB, CKMBINDEX, TROPONINI in the last 168 hours.  HbA1C: Hgb A1c MFr Bld  Date/Time Value Ref Range Status  01/16/2024 05:13 AM 8.0 (H) 4.8 - 5.6 % Final    Comment:    (NOTE) Diagnosis of Diabetes The following HbA1c ranges recommended by the American Diabetes Association (ADA) may be used as an aid in the diagnosis of diabetes mellitus.  Hemoglobin             Suggested A1C NGSP%              Diagnosis  <5.7                   Non Diabetic  5.7-6.4                Pre-Diabetic  >6.4                   Diabetic  <7.0                   Glycemic control for                       adults with diabetes.    01/15/2024 05:24 AM 7.7 (H) 4.8 - 5.6 % Final    Comment:    (NOTE) Diagnosis of Diabetes The following HbA1c ranges recommended by the American Diabetes Association (ADA) may be used as an aid in the diagnosis of diabetes mellitus.  Hemoglobin             Suggested A1C NGSP%              Diagnosis  <5.7                   Non Diabetic  5.7-6.4                Pre-Diabetic  >6.4                   Diabetic  <7.0                   Glycemic control for  adults with diabetes.      CBG: Recent Labs  Lab 01/17/24 1142 01/17/24 1637 01/17/24 2133 01/18/24 0747 01/18/24 1136  GLUCAP 72 288* 273* 219* 385*     Critical care time: n/a    Dorn Chill, MD [facility] Office:  [phone]   See Amion for personal pager PCCM on call pager [phone] until 7pm. Please call Elink 7p-7a. [phone]

## 2024-01-18 NOTE — Progress Notes (Signed)
 PROGRESS NOTE    Holly May  FMW:[id number] DOB: Feb 03, 1951 DOA: 01/14/2024 PCP: Katrinka Aquas, MD   Brief Narrative: 73 year old with past medical history significant for lung cancer, COPD on home oxygen , A-fib, depression, hypertension, diabetes type 2 sent from cancer center for evaluation of hypoxia.  Patient uses 2 L of oxygen  at home in the office she was seen noted to have oxygen  saturation in the 80s.  Patient has a history of A-fib she was noted to be tachycardic in the office as well.  She was treated outside hospital for pneumonia recently and discharged on Friday.  She is still taking his steroids.  Evaluation in the ED she received breathing treatment and IV steroid for wheezing.  Started on Cardizem  drip.  proBNP noted to be 3000 troponin 21.  Chest x-ray negative for acute cardiopulmonary abnormality.   Patient admitted for COPD exacerbation and A-fib with RVR  Assessment & Plan:   Principal Problem:   Atrial fibrillation with RVR (HCC)  1-A-fib with RVR -Suspect exacerbated by lung issues.  - Continue with Eliquis  -now on oral Cardizem , bisoprolol /   COPD exacerbation -Continue with  guaifenesin  -Continue with ceftriaxone  and doxycycline , will treat for 5 days.  -Continue with  Brovana  and Pulmicort  -Continue DuoNeb -Counseling in regards to smoking cessation -IV solumedrol--change to BID.  10/18: She was more tachypnea, extra breathing tx, chest x ray showed right lower lobe consolidation, incentive spirometry, respiratory toilet. CCM consulted.  Considering episode of aspirations. Speech consulted. IV lasix  resume  Appears less tachypnea today    Diabetes type 2  Hyperglycemia - Suspect in the setting of a steroid - A1c 8 She will need meds or insulin  at discharge Continue Lantus  and sliding scale and meal coverage. CBG better control  Hyperkalemia:  Treated with  Lokelma  Lung cancer, left upper lobe adenocarcinoma.  - Continue follow-up  outpatient  Hypertension - Continue with Cardizem   Hyperlipidemia - Resume statin  CKD stage III - Monitor renal function  Leukocytosis in setting steroids.  Chronic heart failure preserved ejection fraction - resume lasix .   Low TSH: Free T3 Pending free T4: 0.58.  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  She will need repeat TSH and free T4 in 4 weeks   Overreactive bladder; Oxybutynin .    Estimated body mass index is 26 kg/m as calculated from the following:   Height as of this encounter: 5' 7 (1.702 m).   Weight as of this encounter: 75.3 kg.   DVT prophylaxis: Eliquis  Code Status: Full code Family Communication: Care discussed with patient Disposition Plan:  Status is: Inpatient Remains inpatient appropriate because: Management of COPD exacerbation    Consultants:  None  Procedures:  None  Antimicrobials:    Subjective: She is sleepy, didn't sleep last night   Objective: Vitals:   01/17/24 2154 01/17/24 2255 01/18/24 0457 01/18/24 0500  BP:  (!) 141/79 (!) 156/86   Pulse:  75 94   Resp:   18   Temp:   (!) 97.5 F (36.4 C)   TempSrc:   Oral   SpO2: 100%  100%   Weight:    75.3 kg  Height:        Intake/Output Summary (Last 24 hours) at 01/18/2024 0741 Last data filed at 01/17/2024 1828 Gross per 24 hour  Intake 870 ml  Output 850 ml  Net 20 ml   Filed Weights   01/16/24 0500 01/17/24 0500 01/18/24 0500  Weight: 75.4 kg 78.9 kg 75.3 kg    Examination:  General exam: NAD Respiratory system: BL ronchus. Normal resp effort.  Cardiovascular system: S 1, S 2 RRR Gastrointestinal system: BS present, soft, nt Central nervous system: Alert.  Data Reviewed: I have personally reviewed following labs and imaging studies  CBC: Recent Labs  Lab 01/14/24 1044 01/15/24 0524 01/16/24 0513 01/17/24 0759 01/18/24 0519  WBC 15.2* 13.1* 20.3* 18.6* 15.8*  NEUTROABS 12.1*  --   --   --    --   HGB 10.5* 9.7* 10.2* 10.2* 10.8*  HCT 35.8* 32.6* 33.9* 35.4* 35.5*  MCV 94.5 93.7 93.4 91.9 90.3  PLT 410* 369 380 380 366   Basic Metabolic Panel: Recent Labs  Lab 01/15/24 0524 01/15/24 1221 01/16/24 0513 01/17/24 0759 01/18/24 0519  NA 135 133* 136 139 140  K 5.2* 5.2* 5.1 4.5 4.4  CL 94* 93* 97* 99 98  CO2 32 29 32 32 32  GLUCOSE 380* 430* 189* 213* 192*  BUN 38* 34* 33* 37* 38*  CREATININE 1.22* 1.16* 1.01* 0.95 0.83  CALCIUM 8.7* 8.7* 9.3 9.2 9.4  MG 2.3  --   --   --   --    GFR: Estimated Creatinine Clearance: 65.9 mL/min (by C-G formula based on SCr of 0.83 mg/dL). Liver Function Tests: No results for input(s): AST, ALT, ALKPHOS, BILITOT, PROT, ALBUMIN  in the last 168 hours. No results for input(s): LIPASE, AMYLASE in the last 168 hours. No results for input(s): AMMONIA in the last 168 hours. Coagulation Profile: No results for input(s): INR, PROTIME in the last 168 hours. Cardiac Enzymes: No results for input(s): CKTOTAL, CKMB, CKMBINDEX, TROPONINI in the last 168 hours. BNP (last 3 results) Recent Labs    01/14/24 1044  PROBNP 3,687.0*   HbA1C: Recent Labs    01/16/24 0513  HGBA1C 8.0*   CBG: Recent Labs  Lab 01/17/24 0752 01/17/24 1130 01/17/24 1142 01/17/24 1637 01/17/24 2133  GLUCAP 202* 84 72 288* 273*   Lipid Profile: No results for input(s): CHOL, HDL, LDLCALC, TRIG, CHOLHDL, LDLDIRECT in the last 72 hours. Thyroid  Function Tests: Recent Labs    01/16/24 0513  FREET4 0.58*  T3FREE 1.1*   Anemia Panel: No results for input(s): VITAMINB12, FOLATE, FERRITIN, TIBC, IRON , RETICCTPCT in the last 72 hours. Sepsis Labs: Recent Labs  Lab 01/15/24 0524  PROCALCITON <0.10    Recent Results (from the past 240 hours)  Resp panel by RT-PCR (RSV, Flu A&B, Covid) Anterior Nasal Swab     Status: None   Collection Time: 01/14/24 12:22 PM   Specimen: Anterior Nasal Swab  Result  Value Ref Range Status   SARS Coronavirus 2 by RT PCR NEGATIVE NEGATIVE Final    Comment: (NOTE) SARS-CoV-2 target nucleic acids are NOT DETECTED.  The SARS-CoV-2 RNA is generally detectable in upper respiratory specimens during the acute phase of infection. The lowest concentration of SARS-CoV-2 viral copies this assay can detect is 138 copies/mL. A negative result does not preclude SARS-Cov-2 infection and should not be used as the sole basis for treatment or other patient management decisions. A negative result may occur with  improper specimen collection/handling, submission of specimen other than nasopharyngeal swab, presence of viral mutation(s) within the areas targeted by this assay, and inadequate number of viral copies(<138 copies/mL). A negative result must be combined with clinical observations, patient history, and epidemiological information. The expected result is Negative.  Fact Sheet for Patients:  BloggerCourse.com  Fact Sheet for  Healthcare Providers:  SeriousBroker.it  This test is no t yet approved or cleared by the [place]  FDA and  has been authorized for detection and/or diagnosis of SARS-CoV-2 by FDA under an Emergency Use Authorization (EUA). This EUA will remain  in effect (meaning this test can be used) for the duration of the COVID-19 declaration under Section 564(b)(1) of the Act, 21 U.S.C.section 360bbb-3(b)(1), unless the authorization is terminated  or revoked sooner.       Influenza A by PCR NEGATIVE NEGATIVE Final   Influenza B by PCR NEGATIVE NEGATIVE Final    Comment: (NOTE) The Xpert Xpress SARS-CoV-2/FLU/RSV plus assay is intended as an aid in the diagnosis of influenza from Nasopharyngeal swab specimens and should not be used as a sole basis for treatment. Nasal washings and aspirates are unacceptable for Xpert Xpress SARS-CoV-2/FLU/RSV testing.  Fact Sheet for  Patients: BloggerCourse.com  Fact Sheet for Healthcare Providers: SeriousBroker.it  This test is not yet approved or cleared by the [place]  FDA and has been authorized for detection and/or diagnosis of SARS-CoV-2 by FDA under an Emergency Use Authorization (EUA). This EUA will remain in effect (meaning this test can be used) for the duration of the COVID-19 declaration under Section 564(b)(1) of the Act, 21 U.S.C. section 360bbb-3(b)(1), unless the authorization is terminated or revoked.     Resp Syncytial Virus by PCR NEGATIVE NEGATIVE Final    Comment: (NOTE) Fact Sheet for Patients: BloggerCourse.com  Fact Sheet for Healthcare Providers: SeriousBroker.it  This test is not yet approved or cleared by the [place]  FDA and has been authorized for detection and/or diagnosis of SARS-CoV-2 by FDA under an Emergency Use Authorization (EUA). This EUA will remain in effect (meaning this test can be used) for the duration of the COVID-19 declaration under Section 564(b)(1) of the Act, 21 U.S.C. section 360bbb-3(b)(1), unless the authorization is terminated or revoked.  Performed at [facility], [address], [address], [place] [address]          Radiology Studies: DG CHEST PORT 1 VIEW Result Date: 01/17/2024 EXAM: 1 VIEW XRAY OF THE CHEST 01/17/2024 09:16:00 AM COMPARISON: 01/14/2024 CLINICAL HISTORY: Increased SOB this morning. Hx of lung cancer, COPD on home oxygen , A-fib. FINDINGS: LUNGS AND PLEURA: Left upper lobe scarring. Interval development of right basilar collapse and consolidation, right-sided volume loss, and small right pleural effusion. HEART AND MEDIASTINUM: Cardiomegaly, stable. Atherosclerotic calcifications of aorta. BONES AND SOFT TISSUES: Old right rib fractures. Median sternotomy changes. IMPRESSION: 1. Interval development of right basilar collapse and  consolidation, right-sided volume loss, and small right pleural effusion. 2. Cardiomegaly, stable. Electronically signed by: Dorethia Molt MD 01/17/2024 09:28 AM EDT RP Workstation: [id number]        Scheduled Meds:  apixaban   5 mg Oral BID   arformoterol   15 mcg Nebulization BID   bisoprolol   10 mg Oral Daily   budesonide   0.25 mg Nebulization BID   diltiazem   240 mg Oral Daily   doxycycline   100 mg Oral Q12H   furosemide   20 mg Intravenous Daily   gabapentin   300 mg Oral QHS   guaiFENesin   1,200 mg Oral BID   insulin  aspart  0-15 Units Subcutaneous TID WC   insulin  aspart  0-5 Units Subcutaneous QHS   insulin  aspart  4 Units Subcutaneous TID WC   insulin  glargine-yfgn  15 Units Subcutaneous Daily   levothyroxine  25 mcg Oral QAC breakfast   methylPREDNISolone  (SOLU-MEDROL ) injection  40 mg Intravenous Q12H  mirtazapine  15 mg Oral QHS   oxybutynin   5 mg Oral TID   pantoprazole  (PROTONIX ) IV  40 mg Intravenous Q12H   pravastatin   40 mg Oral q1800   revefenacin  175 mcg Nebulization Daily   Continuous Infusions:  cefTRIAXone  (ROCEPHIN )  IV 2 g (01/17/24 0917)     LOS: 4 days    Time spent: 35 Minutes    Eri Platten A Andi Mahaffy, MD [facility]   If 7PM-7AM, please contact night-coverage [email or web address]  01/18/2024, 7:41 AM

## 2024-01-19 DIAGNOSIS — I4891 Unspecified atrial fibrillation: Secondary | ICD-10-CM | POA: Diagnosis not present

## 2024-01-19 LAB — CBC
HCT: 40.9 % (ref 36.0–46.0)
Hemoglobin: 11.7 g/dL — ABNORMAL LOW (ref 12.0–15.0)
MCH: 26.2 pg (ref 26.0–34.0)
MCHC: 28.6 g/dL — ABNORMAL LOW (ref 30.0–36.0)
MCV: 91.5 fL (ref 80.0–100.0)
Platelets: 390 K/uL (ref 150–400)
RBC: 4.47 MIL/uL (ref 3.87–5.11)
RDW: 16.6 % — ABNORMAL HIGH (ref 11.5–15.5)
WBC: 19.3 K/uL — ABNORMAL HIGH (ref 4.0–10.5)
nRBC: 0 % (ref 0.0–0.2)

## 2024-01-19 LAB — BASIC METABOLIC PANEL WITH GFR
Anion gap: 9 (ref 5–15)
BUN: 48 mg/dL — ABNORMAL HIGH (ref 8–23)
CO2: 32 mmol/L (ref 22–32)
Calcium: 9.5 mg/dL (ref 8.9–10.3)
Chloride: 98 mmol/L (ref 98–111)
Creatinine, Ser: 1.02 mg/dL — ABNORMAL HIGH (ref 0.44–1.00)
GFR, Estimated: 59 mL/min — ABNORMAL LOW (ref >60)
Glucose, Bld: 304 mg/dL — ABNORMAL HIGH (ref 70–99)
Potassium: 4.8 mmol/L (ref 3.5–5.1)
Sodium: 139 mmol/L (ref 135–145)

## 2024-01-19 LAB — GLUCOSE, CAPILLARY
Glucose-Capillary: 219 mg/dL — ABNORMAL HIGH (ref 70–99)
Glucose-Capillary: 340 mg/dL — ABNORMAL HIGH (ref 70–99)
Glucose-Capillary: 253 mg/dL — ABNORMAL HIGH (ref 70–99)
Glucose-Capillary: 214 mg/dL — ABNORMAL HIGH (ref 70–99)

## 2024-01-19 MED ORDER — PREDNISONE 20 MG PO TABS
40.0000 mg | ORAL_TABLET | Freq: Every day | ORAL | Status: DC
  Administered 2024-01-20: 40 mg via ORAL
  Filled 2024-01-19: qty 2

## 2024-01-19 MED ORDER — FUROSEMIDE 20 MG PO TABS
20.0000 mg | ORAL_TABLET | Freq: Two times a day (BID) | ORAL | Status: DC
  Administered 2024-01-20: 20 mg via ORAL
  Filled 2024-01-19: qty 1

## 2024-01-19 NOTE — Progress Notes (Signed)
 Occupational Therapy Treatment Patient Details Name: Holly May MRN: [id number] DOB: 1951-01-02 Today's Date: 01/19/2024   History of present illness Holly May is a 73 y.o. female admitted with Afib with RVR 01/14/24 with medical history significant of   lung cancer, COPD on home oxygen , A fib, dyspnea, depression, hypertension, type 2 diabetes who was sent from cancer center for evaluation of hypoxia.  Patient normally uses 2 L/min of oxygen  at home and in their office she was satting in the 80s.  Patient has history of atrial fibrillation as well and was tachycardic in the office.   OT comments  Patient seen for skilled OT session this afternoon. Progressing with activity tolerance and reacher and 5 Ps Handout with foam ball issued for HEP. See below for current functional status. Recommendation for [facility] upon discharge remains appropriate. Patient requires continued Acute care hospital level OT services to progress safety and functional performance and allow for discharge.        If plan is discharge home, recommend the following:  A little help with walking and/or transfers;A little help with bathing/dressing/bathroom;Assistance with cooking/housework;Assist for transportation;Help with stairs or ramp for entrance   Equipment Recommendations  Tub/shower bench       Precautions / Restrictions Precautions Precautions: Fall Recall of Precautions/Restrictions: Intact Precaution/Restrictions Comments: watch HR Restrictions Weight Bearing Restrictions Per Provider Order: No       Mobility Bed Mobility Overal bed mobility:  (was in recliner and remained)                  Transfers Overall transfer level: Needs assistance Equipment used: Rolling walker (2 wheels) Transfers: Sit to/from Stand, Bed to chair/wheelchair/BSC Sit to Stand: Supervision     Step pivot transfers: Supervision     General transfer comment: amb to and from toilet with RW and O2 with no drop  in sats     Balance Overall balance assessment: Needs assistance Sitting-balance support: Feet supported, No upper extremity supported Sitting balance-Leahy Scale: Normal     Standing balance support: Bilateral upper extremity supported, No upper extremity supported Standing balance-Leahy Scale: Fair Standing balance comment: stood sinkside with S                           ADL either performed or assessed with clinical judgement   ADL Overall ADL's : Needs assistance/impaired     Grooming: Wash/dry face;Wash/dry hands;Oral care;Set up;Standing Grooming Details (indicate cue type and reason): stood sinkside for progression Upper Body Bathing: Set up;Sitting   Lower Body Bathing: Sit to/from stand;Supervison/ safety   Upper Body Dressing : Set up;Sitting   Lower Body Dressing: Sit to/from stand;Supervision/safety Lower Body Dressing Details (indicate cue type and reason): issued and trained in reacher use and 5 P's for ECT Toilet Transfer: Rolling walker (2 wheels);Supervision/safety   Toileting- Clothing Manipulation and Hygiene: Set up;Sitting/lateral lean       Functional mobility during ADLs: Supervision/safety;Rolling walker (2 wheels) General ADL Comments: progressed toileting and standing level grooming    Extremity/Trunk Assessment Upper Extremity Assessment Upper Extremity Assessment: Generalized weakness;Right hand dominant   Lower Extremity Assessment Lower Extremity Assessment: Defer to PT evaluation                 Communication Communication Communication: Impaired Factors Affecting Communication: Reduced clarity of speech   Cognition Arousal: Alert Behavior During Therapy: WFL for tasks assessed/performed Cognition: No apparent impairments  Following commands: Intact        Cueing   Cueing Techniques: Verbal cues, Gestural cues  Exercises Exercises: Other exercises (Flutter, IS and  issued and trained in foam grasp ball 10 reps grasp and pinch Bly)    Shoulder Instructions       General Comments HR remained between 82-94 for amb fopr ADL's    Pertinent Vitals/ Pain       Pain Assessment Pain Assessment: No/denies pain   Frequency  Min 2X/week        Progress Toward Goals  OT Goals(current goals can now be found in the care plan section)  Progress towards OT goals: Progressing toward goals  Acute Rehab OT Goals Patient Stated Goal: to get stronger OT Goal Formulation: With patient Time For Goal Achievement: 01/30/24 Potential to Achieve Goals: Good ADL Goals Pt Will Perform Lower Body Dressing: with modified independence;sit to/from stand Pt Will Transfer to Toilet: with modified independence;ambulating;regular height toilet Pt Will Perform Toileting - Clothing Manipulation and hygiene: with modified independence;sit to/from stand Pt Will Perform Tub/Shower Transfer: with supervision;Tub transfer;tub bench Additional ADL Goal #1: Patient will teach back 5 ECT strategies with BADL's and mobility  Plan         AM-PAC OT 6 Clicks Daily Activity     Outcome Measure   Help from another person eating meals?: None Help from another person taking care of personal grooming?: None Help from another person toileting, which includes using toliet, bedpan, or urinal?: A Little Help from another person bathing (including washing, rinsing, drying)?: A Little Help from another person to put on and taking off regular upper body clothing?: None Help from another person to put on and taking off regular lower body clothing?: A Little 6 Click Score: 21    End of Session Equipment Utilized During Treatment: Gait belt;Rolling walker (2 wheels);Oxygen   OT Visit Diagnosis: Unsteadiness on feet (R26.81);Muscle weakness (generalized) (M62.81)   Activity Tolerance Patient tolerated treatment well   Patient Left in chair;with call bell/phone within reach;with chair  alarm set   Nurse Communication Mobility status        Time: 8499-8477 OT Time Calculation (min): 22 min  Charges: OT General Charges $OT Visit: 1 Visit OT Treatments $Therapeutic Activity: 8-22 mins  Jordanna Hendrie OT/L Acute Rehabilitation Department  [phone]  01/19/2024, 4:41 PM

## 2024-01-19 NOTE — Progress Notes (Signed)
 PROGRESS NOTE    Holly May  FMW:[id number] DOB: 1950-06-19 DOA: 01/14/2024 PCP: Katrinka Aquas, MD   Brief Narrative: 73 year old with past medical history significant for lung cancer, COPD on home oxygen , A-fib, depression, hypertension, diabetes type 2 sent from cancer center for evaluation of hypoxia.  Patient uses 2 L of oxygen  at home in the office she was seen noted to have oxygen  saturation in the 80s.  Patient has a history of A-fib she was noted to be tachycardic in the office as well.  She was treated outside hospital for pneumonia recently and discharged on Friday.  She is still taking his steroids.  Evaluation in the ED she received breathing treatment and IV steroid for wheezing.  Started on Cardizem  drip.  proBNP noted to be 3000 troponin 21.  Chest x-ray negative for acute cardiopulmonary abnormality.   Patient admitted for COPD exacerbation and A-fib with RVR  Assessment & Plan:   Principal Problem:   Atrial fibrillation with RVR (HCC)  1-A-fib with RVR -Suspect exacerbated by lung issues.  - Continue with Eliquis  -Continue with  oral Cardizem , bisoprolol /   COPD exacerbation -Continue with  guaifenesin  -Continue with ceftriaxone  and doxycycline , will treat for 5 days.  -Continue with  Brovana  and Pulmicort  -Continue DuoNeb -Counseling in regards to smoking cessation -IV solumedrol--change to prednisone  today  -10/18: She was more tachypnea, extra breathing tx, chest x ray showed right lower lobe consolidation, incentive spirometry, respiratory toilet. CCM consulted.  -Considering episode of aspirations. Speech consulted. IV lasix  resume  -monitor on oral prednisone .  Resume oral lasix  tomorrow   Diabetes type 2  Hyperglycemia - Suspect in the setting of a steroid - A1c 8 She will need meds or insulin  at discharge Continue Lantus  and sliding scale and meal coverage.   Hyperkalemia:  Treated with  Lokelma  Lung cancer, left upper lobe  adenocarcinoma.  - Continue follow-up outpatient  Hypertension - Continue with Cardizem   Hyperlipidemia - Resume statin  CKD stage III - Monitor renal function  Leukocytosis in setting steroids.  Chronic heart failure preserved ejection fraction - resume lasix .   Low TSH: Free T3 Pending free T4: 0.58.  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  She will need repeat TSH and free T4 in 4 weeks   Overreactive bladder; Oxybutynin .    Estimated body mass index is 26.69 kg/m as calculated from the following:   Height as of this encounter: 5' 7 (1.702 m).   Weight as of this encounter: 77.3 kg.   DVT prophylaxis: Eliquis  Code Status: Full code Family Communication: Care discussed with patient Disposition Plan:  Status is: Inpatient Remains inpatient appropriate because: Management of COPD exacerbation    Consultants:  None  Procedures:  None  Antimicrobials:    Subjective: She is alert., she wants to go home.  Report breathing ok.   Objective: Vitals:   01/19/24 0622 01/19/24 0823 01/19/24 0824 01/19/24 0825  BP: (!) 147/85     Pulse:      Resp:      Temp:      TempSrc:      SpO2:  96% 97% 97%  Weight:      Height:        Intake/Output Summary (Last 24 hours) at 01/19/2024 1320 Last data filed at 01/19/2024 1212 Gross per 24 hour  Intake 120 ml  Output 700 ml  Net -580 ml   Filed Weights   01/17/24 0500 01/18/24  0500 01/19/24 0444  Weight: 78.9 kg 75.3 kg 77.3 kg    Examination:  General exam: NAD Respiratory system: BL ronchus.  Cardiovascular system: S 1, S 2 RRR Gastrointestinal system: BS present, soft, nt Central nervous system: alert.  Data Reviewed: I have personally reviewed following labs and imaging studies  CBC: Recent Labs  Lab 01/14/24 1044 01/15/24 0524 01/16/24 0513 01/17/24 0759 01/18/24 0519 01/19/24 0837  WBC 15.2* 13.1* 20.3* 18.6* 15.8* 19.3*  NEUTROABS  12.1*  --   --   --   --   --   HGB 10.5* 9.7* 10.2* 10.2* 10.8* 11.7*  HCT 35.8* 32.6* 33.9* 35.4* 35.5* 40.9  MCV 94.5 93.7 93.4 91.9 90.3 91.5  PLT 410* 369 380 380 366 390   Basic Metabolic Panel: Recent Labs  Lab 01/15/24 0524 01/15/24 1221 01/16/24 0513 01/17/24 0759 01/18/24 0519 01/19/24 0837  NA 135 133* 136 139 140 139  K 5.2* 5.2* 5.1 4.5 4.4 4.8  CL 94* 93* 97* 99 98 98  CO2 32 29 32 32 32 32  GLUCOSE 380* 430* 189* 213* 192* 304*  BUN 38* 34* 33* 37* 38* 48*  CREATININE 1.22* 1.16* 1.01* 0.95 0.83 1.02*  CALCIUM 8.7* 8.7* 9.3 9.2 9.4 9.5  MG 2.3  --   --   --   --   --    GFR: Estimated Creatinine Clearance: 54.2 mL/min (A) (by C-G formula based on SCr of 1.02 mg/dL (H)). Liver Function Tests: No results for input(s): AST, ALT, ALKPHOS, BILITOT, PROT, ALBUMIN  in the last 168 hours. No results for input(s): LIPASE, AMYLASE in the last 168 hours. No results for input(s): AMMONIA in the last 168 hours. Coagulation Profile: No results for input(s): INR, PROTIME in the last 168 hours. Cardiac Enzymes: No results for input(s): CKTOTAL, CKMB, CKMBINDEX, TROPONINI in the last 168 hours. BNP (last 3 results) Recent Labs    01/14/24 1044  PROBNP 3,687.0*   HbA1C: No results for input(s): HGBA1C in the last 72 hours.  CBG: Recent Labs  Lab 01/18/24 1136 01/18/24 1643 01/18/24 2212 01/19/24 0730 01/19/24 1121  GLUCAP 385* 307* 133* 219* 340*   Lipid Profile: No results for input(s): CHOL, HDL, LDLCALC, TRIG, CHOLHDL, LDLDIRECT in the last 72 hours. Thyroid  Function Tests: No results for input(s): TSH, T4TOTAL, FREET4, T3FREE, THYROIDAB in the last 72 hours.  Anemia Panel: No results for input(s): VITAMINB12, FOLATE, FERRITIN, TIBC, IRON , RETICCTPCT in the last 72 hours. Sepsis Labs: Recent Labs  Lab 01/15/24 0524  PROCALCITON <0.10    Recent Results (from the past 240 hours)  Resp  panel by RT-PCR (RSV, Flu A&B, Covid) Anterior Nasal Swab     Status: None   Collection Time: 01/14/24 12:22 PM   Specimen: Anterior Nasal Swab  Result Value Ref Range Status   SARS Coronavirus 2 by RT PCR NEGATIVE NEGATIVE Final    Comment: (NOTE) SARS-CoV-2 target nucleic acids are NOT DETECTED.  The SARS-CoV-2 RNA is generally detectable in upper respiratory specimens during the acute phase of infection. The lowest concentration of SARS-CoV-2 viral copies this assay can detect is 138 copies/mL. A negative result does not preclude SARS-Cov-2 infection and should not be used as the sole basis for treatment or other patient management decisions. A negative result may occur with  improper specimen collection/handling, submission of specimen other than nasopharyngeal swab, presence of viral mutation(s) within the areas targeted by this assay, and inadequate number of viral copies(<138 copies/mL). A negative result must be  combined with clinical observations, patient history, and epidemiological information. The expected result is Negative.  Fact Sheet for Patients:  BloggerCourse.com  Fact Sheet for Healthcare Providers:  SeriousBroker.it  This test is no t yet approved or cleared by the [place]  FDA and  has been authorized for detection and/or diagnosis of SARS-CoV-2 by FDA under an Emergency Use Authorization (EUA). This EUA will remain  in effect (meaning this test can be used) for the duration of the COVID-19 declaration under Section 564(b)(1) of the Act, 21 U.S.C.section 360bbb-3(b)(1), unless the authorization is terminated  or revoked sooner.       Influenza A by PCR NEGATIVE NEGATIVE Final   Influenza B by PCR NEGATIVE NEGATIVE Final    Comment: (NOTE) The Xpert Xpress SARS-CoV-2/FLU/RSV plus assay is intended as an aid in the diagnosis of influenza from Nasopharyngeal swab specimens and should not be used as a  sole basis for treatment. Nasal washings and aspirates are unacceptable for Xpert Xpress SARS-CoV-2/FLU/RSV testing.  Fact Sheet for Patients: BloggerCourse.com  Fact Sheet for Healthcare Providers: SeriousBroker.it  This test is not yet approved or cleared by the [place]  FDA and has been authorized for detection and/or diagnosis of SARS-CoV-2 by FDA under an Emergency Use Authorization (EUA). This EUA will remain in effect (meaning this test can be used) for the duration of the COVID-19 declaration under Section 564(b)(1) of the Act, 21 U.S.C. section 360bbb-3(b)(1), unless the authorization is terminated or revoked.     Resp Syncytial Virus by PCR NEGATIVE NEGATIVE Final    Comment: (NOTE) Fact Sheet for Patients: BloggerCourse.com  Fact Sheet for Healthcare Providers: SeriousBroker.it  This test is not yet approved or cleared by the [place]  FDA and has been authorized for detection and/or diagnosis of SARS-CoV-2 by FDA under an Emergency Use Authorization (EUA). This EUA will remain in effect (meaning this test can be used) for the duration of the COVID-19 declaration under Section 564(b)(1) of the Act, 21 U.S.C. section 360bbb-3(b)(1), unless the authorization is terminated or revoked.  Performed at [facility], [address] [place], [place] [address]   Expectorated Sputum Assessment w Gram Stain, Rflx to Resp Cult     Status: None   Collection Time: 01/18/24  4:02 PM   Specimen: Expectorated Sputum  Result Value Ref Range Status   Specimen Description EXPECTORATED SPUTUM  Final   Special Requests NONE  Final   Sputum evaluation   Final    Sputum specimen not acceptable for testing.  Please recollect.   Notified H. Soper RN on 01/18/2024 at 1750 by SL Performed at [facility], [address], [place], [place] [address]    Report Status  01/18/2024 FINAL  Final         Radiology Studies: No results found.       Scheduled Meds:  apixaban   5 mg Oral BID   arformoterol   15 mcg Nebulization BID   bisoprolol   10 mg Oral Daily   budesonide   0.25 mg Nebulization BID   diltiazem   240 mg Oral Daily   doxycycline   100 mg Oral Q12H   furosemide   20 mg Intravenous Daily   gabapentin   300 mg Oral QHS   guaiFENesin   1,200 mg Oral BID   insulin  aspart  0-15 Units Subcutaneous TID WC   insulin  aspart  0-5 Units Subcutaneous QHS   insulin  aspart  4 Units Subcutaneous TID WC   insulin  glargine-yfgn  15 Units Subcutaneous Daily   levothyroxine  25 mcg Oral QAC breakfast   mirtazapine  15 mg Oral QHS   oxybutynin   5 mg Oral TID   pantoprazole   40 mg Oral BID   pravastatin   40 mg Oral q1800   [START ON 01/20/2024] predniSONE   40 mg Oral Q breakfast   revefenacin  175 mcg Nebulization Daily   Continuous Infusions:  cefTRIAXone  (ROCEPHIN )  IV 2 g (01/19/24 1032)     LOS: 5 days    Time spent: 35 Minutes    Tawana Pasch A Laure Leone, MD [facility]   If 7PM-7AM, please contact night-coverage [email or web address]  01/19/2024, 1:20 PM

## 2024-01-19 NOTE — Progress Notes (Signed)
 Physical Therapy Treatment Patient Details Name: Holly May MRN: [id number] DOB: 09-30-1950 Today's Date: 01/19/2024   History of Present Illness Holly May is a 73 y.o. female admitted with Afib with RVR 01/14/24 with medical history significant of   lung cancer, COPD on home oxygen , A fib, dyspnea, depression, hypertension, type 2 diabetes who was sent from cancer center for evaluation of hypoxia.  Patient normally uses 2 L/min of oxygen  at home and in their office she was satting in the 80s.  Patient has history of atrial fibrillation as well and was tachycardic in the office.    PT Comments  Pt making gradual progress.  She ambulated 400' with RW.  Needs min cues for safety.  O2 sats were stable on 2 L O2.  Continue POC with recommendation for HHPT at d/c.     If plan is discharge home, recommend the following: A little help with walking and/or transfers;A little help with bathing/dressing/bathroom;Assistance with cooking/housework;Help with stairs or ramp for entrance;Assist for transportation   Can travel by private vehicle        Equipment Recommendations  Rolling Holly (2 wheels)    Recommendations for Other Services       Precautions / Restrictions Precautions Precautions: Fall     Mobility  Bed Mobility Overal bed mobility: Needs Assistance Bed Mobility: Rolling, Supine to Sit Rolling: Supervision   Supine to sit: Supervision          Transfers Overall transfer level: Needs assistance Equipment used: Rolling Holly (2 wheels) Transfers: Sit to/from Stand Sit to Stand: Supervision           General transfer comment: STS from bed and toilet; pt performed toileting adls    Ambulation/Gait Ambulation/Gait assistance: Supervision Gait Distance (Feet): 400 Feet Assistive device: Rolling Holly (2 wheels) Gait Pattern/deviations: Step-through pattern Gait velocity: decreased but functional     General Gait Details: 1 standing rest break, min cues  for RW with turning   Stairs             Wheelchair Mobility     Tilt Bed    Modified Rankin (Stroke Patients Only)       Balance Overall balance assessment: Needs assistance Sitting-balance support: Feet supported, No upper extremity supported Sitting balance-Leahy Scale: Normal     Standing balance support: Bilateral upper extremity supported, No upper extremity supported Standing balance-Leahy Scale: Fair Standing balance comment: RW to ambulate but could stand without support                            Communication Communication Factors Affecting Communication: Reduced clarity of speech  Cognition Arousal: Alert Behavior During Therapy: WFL for tasks assessed/performed   PT - Cognitive impairments: No apparent impairments                         Following commands: Intact      Cueing    Exercises      General Comments General comments (skin integrity, edema, etc.): Pt on 2 L O2 with sats 98% or >.  HR up to 118 bpm with walking      Pertinent Vitals/Pain Pain Assessment Pain Assessment: No/denies pain    Home Living                          Prior Function  PT Goals (current goals can now be found in the care plan section) Progress towards PT goals: Progressing toward goals    Frequency    Min 2X/week      PT Plan      Co-evaluation              AM-PAC PT 6 Clicks Mobility   Outcome Measure  Help needed turning from your back to your side while in a flat bed without using bedrails?: None Help needed moving from lying on your back to sitting on the side of a flat bed without using bedrails?: None Help needed moving to and from a bed to a chair (including a wheelchair)?: A Little Help needed standing up from a chair using your arms (e.g., wheelchair or bedside chair)?: A Little Help needed to walk in hospital room?: A Little Help needed climbing 3-5 steps with a railing? : A  Little 6 Click Score: 20    End of Session Equipment Utilized During Treatment: Gait belt;Oxygen  Activity Tolerance: Patient tolerated treatment well Patient left: in chair;with chair alarm set;with call bell/phone within reach Nurse Communication: Mobility status PT Visit Diagnosis: Muscle weakness (generalized) (M62.81);Difficulty in walking, not elsewhere classified (R26.2)     Time: 1222-1253 PT Time Calculation (min) (ACUTE ONLY): 31 min  Charges:    $Gait Training: 8-22 mins $Therapeutic Activity: 8-22 mins PT General Charges $$ ACUTE PT VISIT: 1 Visit                     Benjiman, PT Acute Rehab [facility] [phone]    Benjiman VEAR Mulberry 01/19/2024, 1:42 PM

## 2024-01-19 NOTE — Evaluation (Signed)
 Clinical/Bedside Swallow Evaluation Patient Details  Name: Holly May MRN: [id number] Date of Birth: 08-08-1950  Today's Date: 01/19/2024 Time: SLP Start Time (ACUTE ONLY): 9162 SLP Stop Time (ACUTE ONLY): 0852 SLP Time Calculation (min) (ACUTE ONLY): 15 min  Past Medical History:  Past Medical History:  Diagnosis Date   Arthritis    Cancer (HCC)    lung   COPD (chronic obstructive pulmonary disease) (HCC)    Depression    Dyspnea    Essential hypertension, benign    Type 2 diabetes mellitus (HCC)    Urinary incontinence    Past Surgical History:  Past Surgical History:  Procedure Laterality Date   BRONCHIAL BIOPSY  07/09/2022   Procedure: BRONCHIAL BIOPSIES;  Surgeon: Brenna Adine CROME, DO;  Location: [facility];  Service: Pulmonary;;   BRONCHIAL BRUSHINGS  07/09/2022   Procedure: BRONCHIAL BRUSHINGS;  Surgeon: Brenna Adine CROME, DO;  Location: [facility];  Service: Pulmonary;;   BRONCHIAL NEEDLE ASPIRATION BIOPSY  07/09/2022   Procedure: BRONCHIAL NEEDLE ASPIRATION BIOPSIES;  Surgeon: Brenna Adine CROME, DO;  Location: [facility];  Service: Pulmonary;;   CHOLECYSTECTOMY     FIDUCIAL MARKER PLACEMENT  07/09/2022   Procedure: FIDUCIAL MARKER PLACEMENT;  Surgeon: Brenna Adine CROME, DO;  Location: [facility];  Service: Pulmonary;;   FRACTURE SURGERY Left    arm   OPEN REDUCTION INTERNAL FIXATION (ORIF) PROXIMAL PHALANX Left 04/11/2017   Procedure: OPEN REDUCTION INTERNAL FIXATION (ORIF) PROXIMAL PHALANX;  Surgeon: Sissy Cough, MD;  Location: [facility];  Service: Orthopedics;  Laterality: Left;   RESECTION OF MEDIASTINAL MASS N/A 02/18/2019   Procedure: RESECTION OF MEDIASTINAL MASS;  Surgeon: Fleeta Hanford Coy, MD;  Location: [facility] OR;  Service: Thoracic;  Laterality: N/A;   STERNOTOMY N/A 02/18/2019   Procedure: STERNOTOMY;  Surgeon: Fleeta Hanford Coy, MD;  Location: [facility] OR;  Service: Thoracic;  Laterality: N/A;   VIDEO BRONCHOSCOPY WITH ENDOBRONCHIAL  ULTRASOUND Bilateral 07/09/2022   Procedure: VIDEO BRONCHOSCOPY WITH ENDOBRONCHIAL ULTRASOUND;  Surgeon: Brenna Adine CROME, DO;  Location: [facility];  Service: Pulmonary;  Laterality: Bilateral;   HPI:  Patient is a 73 y.o. female with medical history significant of lung cancer, COPD on home oxygen , A fib, dyspnea, depression, hypertension, type 2 diabetes who was sent from cancer center for evaluation of hypoxia on 10/15. FEES completed in August 2025 which revealed [facility] oropharyngeal swallowing across all boluses. No aspiration or penetration was observed with any textures. She wears top and bottom dentures but stated they don't fit well, especially the bottom ones. Patient reports issues chewing her food properly with no teeth. BSE ordered to evaluate patient's swallow.    Assessment / Plan / Recommendation  Clinical Impression  Patient is not currently presenting with clinical s/s of dysphagia as per this BSE. She was awake, alert, and participated fully in this evaluation. She was edentulous and indicated she does not wear dentures to eat as they do not fit well. Specifically her bottom dentures. SLP educated patient on different denture adhesives but patient reports she's tried everything. SLP assessed patient's swallow via PO's of thin liquids and mechanical soft solids. Patient exhibited prolonged mastication of solids, presumed to be due to lack of dentition. Swallow initiation was timely and no overt s/s of aspiration observed with straw sips of thin liquids and mechanical soft solids. SLP is recommending to continue DYS 3 diet (mechanical soft) solids and thin liquids. SLP signing off at this time. SLP Visit Diagnosis: Dysphagia, unspecified (R13.10)  Aspiration Risk       Diet Recommendation Dysphagia 3 (Mech soft);Thin liquid    Liquid Administration via: Spoon;Cup;Straw Medication Administration: Other (Comment) (as tolerated) Supervision: Patient able to self feed Compensations:  Slow rate;Small sips/bites Postural Changes: Seated upright at 90 degrees;Remain upright for at least 30 minutes after po intake    Other  Recommendations Oral Care Recommendations: Oral care BID     Assistance Recommended at Discharge    Functional Status Assessment Patient has had a recent decline in their functional status and demonstrates the ability to make significant improvements in function in a reasonable and predictable amount of time.  Frequency and Duration            Prognosis        Swallow Study   General Date of Onset: 01/19/24 HPI: Patient is a 73 y.o. female with medical history significant of lung cancer, COPD on home oxygen , A fib, dyspnea, depression, hypertension, type 2 diabetes who was sent from cancer center for evaluation of hypoxia on 10/15. FEES completed in August 2025 which revealed [facility] oropharyngeal swallowing across all boluses. No aspiration or penetration was observed with any textures. She wears top and bottom dentures but stated they don't fit well, especially the bottom ones. Patient reports issues chewing her food properly with no teeth. BSE ordered to evaluate patient's swallow. Type of Study: Bedside Swallow Evaluation Previous Swallow Assessment: FEES at [facility] August 2025 Temperature Spikes Noted: No Respiratory Status: Nasal cannula History of Recent Intubation: No Behavior/Cognition: Alert;Cooperative;Pleasant mood Oral Cavity Assessment: Within Functional Limits Oral Care Completed by SLP: No Oral Cavity - Dentition: Dentures, top;Dentures, bottom;Dentures, not available Vision: Functional for self-feeding Self-Feeding Abilities: Able to feed self Patient Positioning: Upright in chair Baseline Vocal Quality: Normal    Oral/Motor/Sensory Function Overall Oral Motor/Sensory Function: Within functional limits   Ice Chips     Thin Liquid Thin Liquid: Within functional limits Presentation: Self Fed    Nectar Thick     Honey Thick     Puree      Solid     Solid: Within functional limits Presentation: Self Fed     Damien Hy  Graduate SLP Clinican

## 2024-01-19 NOTE — Progress Notes (Signed)
 01/19/2024 2:57 PM -----------------------------------------------------------[facility]--------------------------------------------------- D(Data) A(Action) R(response)     Data: Discharge Readiness Assessment EDD currently listed for tomorrow 01/20/2024. Needing HH PT/OT orders per TOC notes.    Action: Chart reviewed, EPIC Secure Chat with MD    Response: MD Acknowledged message. No immediate Barriers to discharge identified at this time.     Katie Faraone, RN [employer]

## 2024-01-19 NOTE — TOC Progression Note (Signed)
 Transition of Care [facility]) - Progression Note   Patient Details  Name: Holly May MRN: [id number] Date of Birth: [birth year]  Transition [facility] CM/SW Contact  Duwaine GORMAN Aran, LCSW Phone Number: 01/19/2024, 2:25 PM  Clinical Narrative: [name] is only offer for HHPT/OT as patient's insurance is OON with most HH agencies. Patient aware [name] will provide [facility] services after discharge. CSW discussed food insecurity with patient and she is agreeable to food pantry resources being added to AVS. CSW followed up with Mitch with [employer] to have the travel O2 tank and rolling walker delivered to the room. Care management to follow.  Expected Discharge Plan: Home w Home Health Services Barriers to Discharge: Continued Medical Work up  Expected Discharge Plan and Services In-house Referral: Clinical Social Work Post Acute Care Choice: Horticulturist, commercial, Home Health Living arrangements for the past 2 months: Single Family Home           DME Arranged: Walker rolling, Other see comment (POC) DME Agency: [employer] Date DME Agency Contacted: 01/16/24 Time DME Agency Contacted: [phone] Representative spoke with at DME Agency: Mitch  Social Drivers of Health (SDOH) Interventions SDOH Screenings   Food Insecurity: Food Insecurity Present (01/14/2024)  Housing: Low Risk  (01/14/2024)  Transportation Needs: No Transportation Needs (01/14/2024)  Utilities: Not At Risk (01/14/2024)  Alcohol Screen: Low Risk  (07/30/2022)  Depression (PHQ2-9): Low Risk  (01/14/2024)  Financial Resource Strain: Medium Risk (11/04/2023)   Received from [facility]  Social Connections: Moderately Isolated (01/14/2024)  Tobacco Use: High Risk (01/14/2024)   Readmission Risk Interventions     No data to display

## 2024-01-19 NOTE — Inpatient Diabetes Management (Incomplete)
 Inpatient Diabetes Program Recommendations  AACE/ADA: New Consensus Statement on Inpatient Glycemic Control (2015)  Target Ranges:  Prepandial:   less than 140 mg/dL      Peak postprandial:   less than 180 mg/dL (1-2 hours)      Critically ill patients:  140 - 180 mg/dL   Lab Results  Component Value Date   GLUCAP 340 (H) 01/19/2024   HGBA1C 8.0 (H) 01/16/2024    Review of Glycemic Control  Diabetes history: DM2 Outpatient Diabetes medications: *** Current orders for Inpatient glycemic control: ***  Inpatient Diabetes Program Recommendations:    Consider increasing Lantus  to 18 units daily

## 2024-01-20 DIAGNOSIS — I4891 Unspecified atrial fibrillation: Secondary | ICD-10-CM | POA: Diagnosis not present

## 2024-01-20 LAB — GLUCOSE, CAPILLARY
Glucose-Capillary: 79 mg/dL (ref 70–99)
Glucose-Capillary: 246 mg/dL — ABNORMAL HIGH (ref 70–99)

## 2024-01-20 MED ORDER — BLOOD GLUCOSE MONITORING SUPPL DEVI: 1.0000 | 0 refills | Status: AC

## 2024-01-20 MED ORDER — NICOTINE 21 MG/24HR TD PT24: 21.0000 mg | MEDICATED_PATCH | Freq: Every day | TRANSDERMAL | 0 refills | Status: DC | PRN

## 2024-01-20 MED ORDER — GUAIFENESIN ER 600 MG PO TB12: 1200.0000 mg | ORAL_TABLET | Freq: Two times a day (BID) | ORAL | 0 refills | Status: AC

## 2024-01-20 MED ORDER — PREDNISONE 20 MG PO TABS: 40.0000 mg | ORAL_TABLET | Freq: Every day | ORAL | 0 refills | Status: AC

## 2024-01-20 MED ORDER — GLIPIZIDE 5 MG PO TABS: 5.0000 mg | ORAL_TABLET | Freq: Two times a day (BID) | ORAL | 0 refills | Status: AC

## 2024-01-20 MED ORDER — HYDROCODONE-ACETAMINOPHEN 5-325 MG PO TABS: 1.0000 | ORAL_TABLET | ORAL | 0 refills | Status: DC | PRN

## 2024-01-20 MED ORDER — IPRATROPIUM-ALBUTEROL 0.5-2.5 (3) MG/3ML IN SOLN: 3.0000 mL | Freq: Four times a day (QID) | RESPIRATORY_TRACT | 0 refills | Status: AC | PRN

## 2024-01-20 MED ORDER — BLOOD GLUCOSE TEST VI STRP: 1.0000 | ORAL_STRIP | 0 refills | Status: AC

## 2024-01-20 MED ORDER — LANCET DEVICE MISC: 1.0000 | 0 refills | Status: AC

## 2024-01-20 MED ORDER — LEVOTHYROXINE SODIUM 25 MCG PO TABS: 25.0000 ug | ORAL_TABLET | Freq: Every day | ORAL | 0 refills | Status: DC

## 2024-01-20 MED ORDER — DILTIAZEM HCL ER COATED BEADS 240 MG PO CP24: 240.0000 mg | ORAL_CAPSULE | Freq: Every day | ORAL | 3 refills | Status: AC

## 2024-01-20 MED ORDER — FUROSEMIDE 20 MG PO TABS: 20.0000 mg | ORAL_TABLET | Freq: Two times a day (BID) | ORAL | 1 refills | Status: DC

## 2024-01-20 MED ORDER — BISOPROLOL FUMARATE 10 MG PO TABS: 10.0000 mg | ORAL_TABLET | Freq: Every day | ORAL | 0 refills | Status: DC

## 2024-01-20 MED ORDER — PANTOPRAZOLE SODIUM 40 MG PO TBEC: 40.0000 mg | DELAYED_RELEASE_TABLET | Freq: Two times a day (BID) | ORAL | 0 refills | Status: AC

## 2024-01-20 MED ORDER — LANCETS MISC: 1.0000 | 0 refills | Status: AC

## 2024-01-20 NOTE — Progress Notes (Signed)
 AVS reviewed w/ patient who verbalized an understanding. PIV x 2 removed as noted. Pt's ride, her sister,  enroute- should be here by 1300. RW and travel tank in room. Patient eating lunch

## 2024-01-20 NOTE — Discharge Summary (Addendum)
 Physician Discharge Summary   Patient: Holly May MRN: [id number] DOB: [birth year]  Admit date:     01/14/2024  Discharge date: 01/20/24  Discharge Physician: Owen DELENA Lore   PCP: Katrinka Aquas, MD   Recommendations at discharge:   Needs to follow up with oncology further care of Lung cancer.  Follow Bmet to monitor electrolytes.  Started synthroid, needs TSH   Discharge Diagnoses: Principal Problem:   Atrial fibrillation with RVR (HCC)  Resolved Problems:   * No resolved hospital problems. *  Hospital Course: 73 year old with past medical history significant for lung cancer, COPD on home oxygen , A-fib, depression, hypertension, diabetes type 2 sent from cancer center for evaluation of hypoxia.  Patient uses 2 L of oxygen  at home in the office she was seen noted to have oxygen  saturation in the 80s.  Patient has a history of A-fib she was noted to be tachycardic in the office as well.  She was treated outside hospital for pneumonia recently and discharged on Friday.  She is still taking his steroids.   Evaluation in the ED she received breathing treatment and IV steroid for wheezing.  Started on Cardizem  drip.  proBNP noted to be 3000 troponin 21.  Chest x-ray negative for acute cardiopulmonary abnormality.     Patient admitted for COPD exacerbation and A-fib with RVR   Assessment and Plan: 1-A-fib with RVR -Suspect exacerbated by lung issues.  - Continue with Eliquis  -Continue with  oral Cardizem , bisoprolol /  Stable.   COPD exacerbation and PNA -Continue with  guaifenesin  -Continue with ceftriaxone  and doxycycline , treated for 5 days.  -Continue with  Brovana  and Pulmicort  -Continue DuoNeb -Counseling in regards to smoking cessation -IV solumedrol--change to prednisone , discharge on 5 days.  -10/18: She was more tachypnea, extra breathing tx, chest x ray showed right lower lobe consolidation, incentive spirometry, respiratory toilet. CCM consulted.   -Considering episode of aspirations. Speech consulted. IV lasix  resume  -monitor on oral prednisone .  Resume oral lasix   Stable for discharge   Diabetes type 2  Hyperglycemia - Suspect in the setting of a steroid - A1c 8 She will need meds or insulin  at discharge Continue Lantus  and sliding scale and meal coverage.     Hyperkalemia:  Treated with  Lokelma   Lung cancer, left upper lobe adenocarcinoma.  - Continue follow-up outpatient   Hypertension - Continue with Cardizem    Hyperlipidemia - Resume statin   CKD stage III - Monitor renal function   Leukocytosis in setting steroids.  Chronic heart failure preserved ejection fraction - resume lasix .    Low TSH: Free T3 Pending free T4: 0.58.  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  She will need repeat TSH and free T4 in 4 weeks     Overreactive bladder; Oxybutynin .      Estimated body mass index is 26.69 kg/m as calculated from the following:   Height as of this encounter: 5' 7 (1.702 m).   Weight as of this encounter: 77.3 kg.        Consultants: Pulmonologist  Procedures performed:  Disposition: Home Diet recommendation:  Discharge Diet Orders (From admission, onward)     Start     Ordered   01/20/24 0000  Diet Carb Modified        01/20/24 1113           Carb modified diet DISCHARGE MEDICATION: Allergies as of 01/20/2024   No Known Allergies  Medication List     STOP taking these medications    amLODipine  5 MG tablet Commonly known as: NORVASC    omeprazole 20 MG capsule Commonly known as: PRILOSEC   torsemide 20 MG tablet Commonly known as: DEMADEX       TAKE these medications    albuterol  108 (90 Base) MCG/ACT inhaler Commonly known as: VENTOLIN  HFA Inhale 2 puffs into the lungs every 4 (four) hours as needed for wheezing or shortness of breath (cough, shortness of breath or wheezing.).   apixaban  5 MG Tabs  tablet Commonly known as: Eliquis  Take 1 tablet (5 mg total) by mouth 2 (two) times daily. Notes to patient: Breakfast & dinner   bisoprolol  10 MG tablet Commonly known as: ZEBETA  Take 1 tablet (10 mg total) by mouth daily. Start taking on: January 21, 2024 What changed:  medication strength how much to take   Blood Glucose Monitoring Suppl Devi 1 each by Does not apply route as directed. Dispense based on patient and insurance preference. Use up to four times daily as directed. (FOR ICD-10 E10.9, E11.9).   BLOOD GLUCOSE TEST STRIPS Strp 1 each by Does not apply route as directed. Dispense based on patient and insurance preference. Use up to four times daily as directed. (FOR ICD-10 E10.9, E11.9).   budesonide  0.25 MG/2ML nebulizer solution Commonly known as: Pulmicort  One vial with each duoneb up to 4 x daily What changed:  how much to take how to take this when to take this reasons to take this   citalopram  40 MG tablet Commonly known as: CELEXA  Take 40 mg by mouth daily.   diltiazem  240 MG 24 hr capsule Commonly known as: CARDIZEM  CD Take 1 capsule (240 mg total) by mouth daily. Start taking on: January 21, 2024   furosemide  20 MG tablet Commonly known as: LASIX  Take 1 tablet (20 mg total) by mouth 2 (two) times daily. What changed: See the new instructions.   gabapentin  300 MG capsule Commonly known as: NEURONTIN  Take 300 mg by mouth at bedtime.   glipiZIDE 5 MG tablet Commonly known as: GLUCOTROL Take 1 tablet (5 mg total) by mouth 2 (two) times daily before a meal.   guaiFENesin  600 MG 12 hr tablet Commonly known as: MUCINEX  Take 2 tablets (1,200 mg total) by mouth 2 (two) times daily.   HYDROcodone -acetaminophen  5-325 MG tablet Commonly known as: NORCO/VICODIN Take 1-2 tablets by mouth every 4 (four) hours as needed for moderate pain (pain score 4-6).   ipratropium-albuterol  0.5-2.5 (3) MG/3ML Soln Commonly known as: DUONEB Take 3 mLs by nebulization  every 6 (six) hours as needed (wheezing, shortness of breath).   Lancet Device Misc 1 each by Does not apply route as directed. Dispense based on patient and insurance preference. Use up to four times daily as directed. (FOR ICD-10 E10.9, E11.9).   Lancets Misc 1 each by Does not apply route as directed. Dispense based on patient and insurance preference. Use up to four times daily as directed. (FOR ICD-10 E10.9, E11.9).   levothyroxine 25 MCG tablet Commonly known as: SYNTHROID Take 1 tablet (25 mcg total) by mouth daily before breakfast. Start taking on: January 21, 2024   lovastatin 40 MG tablet Commonly known as: MEVACOR Take 40 mg by mouth at bedtime.   mirtazapine 15 MG tablet Commonly known as: REMERON Take 15 mg by mouth at bedtime. For appetite   naltrexone 50 MG tablet Commonly known as: DEPADE Take 25 mg by mouth daily.  nicotine  21 mg/24hr patch Commonly known as: NICODERM CQ  - dosed in mg/24 hours Place 1 patch (21 mg total) onto the skin daily as needed (craving smoking).   olmesartan  40 MG tablet Commonly known as: BENICAR  Take 1 tablet (40 mg total) by mouth daily.   oxybutynin  5 MG tablet Commonly known as: DITROPAN  Take 5 mg by mouth 3 (three) times daily.   pantoprazole  40 MG tablet Commonly known as: PROTONIX  Take 1 tablet (40 mg total) by mouth 2 (two) times daily.   potassium chloride  SA 20 MEQ tablet Commonly known as: KLOR-CON  M TAKE ONE TABLET BY MOUTH ONCE DAILY   predniSONE  20 MG tablet Commonly known as: DELTASONE  Take 2 tablets (40 mg total) by mouth daily with breakfast for 5 days. Start taking on: January 21, 2024 What changed:  medication strength how much to take how to take this when to take this additional instructions   Trelegy Ellipta 100-62.5-25 MCG/ACT Aepb Generic drug: Fluticasone -Umeclidin-Vilant Inhale 1 puff into the lungs daily.               Durable Medical Equipment  (From admission, onward)            Start     Ordered   01/16/24 1501  For home use only DME Walker rolling  Once       Question Answer Comment  Walker: With 5 Inch Wheels   Patient needs a walker to treat with the following condition Balance disorder      01/16/24 1500   01/16/24 1500  For home use only DME oxygen   Once       Comments: POC eval 1-5L pulse dose, if patient qualifies please dispense  Question Answer Comment  Length of Need Lifetime   Frequency Continuous (stationary and portable oxygen  unit needed)   Oxygen  delivery system Gas      01/16/24 1500            Contact information for follow-up providers     [employer]. Call.   Contact information: [address] [place], [place] [address] [phone]        [employer]. Call.   Contact information: [address] Christianna NOVAK [place], [place] [address] [phone]        [employer] [facility] [employer]. Call.   Contact information: [address] [place], [place] [address] [phone]             Contact information for after-discharge care     Home Medical Care     [facility]) .   Service: Home Health Services Why: [name] will provide PT and OT in the home after discharge. Contact information: [address] [place]  [address] [phone]                    Discharge Exam: Fredricka Weights   01/18/24 0500 01/19/24 0444 01/20/24 0500  Weight: 75.3 kg 77.3 kg 77.3 kg   General: NAD  Condition at discharge: stable  The results of significant diagnostics from this hospitalization (including imaging, microbiology, ancillary and laboratory) are listed below for reference.   Imaging Studies: DG CHEST PORT 1 VIEW Result Date: 01/17/2024 EXAM: 1 VIEW XRAY OF THE CHEST 01/17/2024 09:16:00 AM COMPARISON: 01/14/2024 CLINICAL HISTORY: Increased SOB this morning. Hx of lung cancer, COPD on home oxygen , A-fib. FINDINGS:  LUNGS AND PLEURA: Left upper lobe scarring. Interval development of  right basilar collapse and consolidation, right-sided volume loss, and small right pleural effusion. HEART AND MEDIASTINUM: Cardiomegaly, stable. Atherosclerotic calcifications of aorta. BONES AND SOFT TISSUES: Old right rib fractures. Median sternotomy changes. IMPRESSION: 1. Interval development of right basilar collapse and consolidation, right-sided volume loss, and small right pleural effusion. 2. Cardiomegaly, stable. Electronically signed by: Dorethia Molt MD 01/17/2024 09:28 AM EDT RP Workstation: [id number]   DG Chest 1 View Result Date: 01/14/2024 EXAM: 1 VIEW(S) XRAY OF THE CHEST 01/14/2024 11:18:50 AM COMPARISON: 07/23/2023 CLINICAL HISTORY: sob. FINDINGS: LUNGS AND PLEURA: Probable left upper lobe scarring. No focal pulmonary opacity. No pulmonary edema. No pleural effusion. No pneumothorax. HEART AND MEDIASTINUM: Stable cardiomediastinal silhouette. Sternotomy wires are noted. BONES AND SOFT TISSUES: Probable old right rib fractures are noted. No acute osseous abnormality. IMPRESSION: 1. No acute cardiopulmonary abnormality. 2. Stable left upper lobe scarring. 3. Stable old right rib fractures. Electronically signed by: Lynwood Seip MD 01/14/2024 11:36 AM EDT RP Workstation: [id number]    Microbiology: Results for orders placed or performed during the hospital encounter of 01/14/24  Resp panel by RT-PCR (RSV, Flu A&B, Covid) Anterior Nasal Swab     Status: None   Collection Time: 01/14/24 12:22 PM   Specimen: Anterior Nasal Swab  Result Value Ref Range Status   SARS Coronavirus 2 by RT PCR NEGATIVE NEGATIVE Final    Comment: (NOTE) SARS-CoV-2 target nucleic acids are NOT DETECTED.  The SARS-CoV-2 RNA is generally detectable in upper respiratory specimens during the acute phase of infection. The lowest concentration of SARS-CoV-2 viral copies this assay can detect is 138 copies/mL. A negative result does not preclude  SARS-Cov-2 infection and should not be used as the sole basis for treatment or other patient management decisions. A negative result may occur with  improper specimen collection/handling, submission of specimen other than nasopharyngeal swab, presence of viral mutation(s) within the areas targeted by this assay, and inadequate number of viral copies(<138 copies/mL). A negative result must be combined with clinical observations, patient history, and epidemiological information. The expected result is Negative.  Fact Sheet for Patients:  bloggercourse.com  Fact Sheet for Healthcare Providers:  seriousbroker.it  This test is no t yet approved or cleared by the [place]  FDA and  has been authorized for detection and/or diagnosis of SARS-CoV-2 by FDA under an Emergency Use Authorization (EUA). This EUA will remain  in effect (meaning this test can be used) for the duration of the COVID-19 declaration under Section 564(b)(1) of the Act, 21 U.S.C.section 360bbb-3(b)(1), unless the authorization is terminated  or revoked sooner.       Influenza A by PCR NEGATIVE NEGATIVE Final   Influenza B by PCR NEGATIVE NEGATIVE Final    Comment: (NOTE) The Xpert Xpress SARS-CoV-2/FLU/RSV plus assay is intended as an aid in the diagnosis of influenza from Nasopharyngeal swab specimens and should not be used as a sole basis for treatment. Nasal washings and aspirates are unacceptable for Xpert Xpress SARS-CoV-2/FLU/RSV testing.  Fact Sheet for Patients: bloggercourse.com  Fact Sheet for Healthcare Providers: seriousbroker.it  This test is not yet approved or cleared by the [place]  FDA and has been authorized for detection and/or diagnosis of SARS-CoV-2 by FDA under an Emergency Use Authorization (EUA). This EUA will remain in effect (meaning this test can be used) for the duration of  the COVID-19 declaration under Section 564(b)(1) of the Act, 21 U.S.C. section 360bbb-3(b)(1), unless the authorization is terminated or revoked.     Resp Syncytial Virus by  PCR NEGATIVE NEGATIVE Final    Comment: (NOTE) Fact Sheet for Patients: bloggercourse.com  Fact Sheet for Healthcare Providers: seriousbroker.it  This test is not yet approved or cleared by the [place]  FDA and has been authorized for detection and/or diagnosis of SARS-CoV-2 by FDA under an Emergency Use Authorization (EUA). This EUA will remain in effect (meaning this test can be used) for the duration of the COVID-19 declaration under Section 564(b)(1) of the Act, 21 U.S.C. section 360bbb-3(b)(1), unless the authorization is terminated or revoked.  Performed at [facility], [address], [place], [place] [address]   Expectorated Sputum Assessment w Gram Stain, Rflx to Resp Cult     Status: None   Collection Time: 01/18/24  4:02 PM   Specimen: Expectorated Sputum  Result Value Ref Range Status   Specimen Description EXPECTORATED SPUTUM  Final   Special Requests NONE  Final   Sputum evaluation   Final    Sputum specimen not acceptable for testing.  Please recollect.   Notified H. Soper RN on 01/18/2024 at 1750 by SL Performed at [facility], [address], [place], [place] [address]    Report Status 01/18/2024 FINAL  Final    Labs: CBC: Recent Labs  Lab 01/14/24 1044 01/15/24 0524 01/16/24 0513 01/17/24 0759 01/18/24 0519 01/19/24 0837  WBC 15.2* 13.1* 20.3* 18.6* 15.8* 19.3*  NEUTROABS 12.1*  --   --   --   --   --   HGB 10.5* 9.7* 10.2* 10.2* 10.8* 11.7*  HCT 35.8* 32.6* 33.9* 35.4* 35.5* 40.9  MCV 94.5 93.7 93.4 91.9 90.3 91.5  PLT 410* 369 380 380 366 390   Basic Metabolic Panel: Recent Labs  Lab 01/15/24 0524 01/15/24 1221 01/16/24 0513 01/17/24 0759 01/18/24 0519 01/19/24 0837  NA 135 133* 136 139 140  139  K 5.2* 5.2* 5.1 4.5 4.4 4.8  CL 94* 93* 97* 99 98 98  CO2 32 29 32 32 32 32  GLUCOSE 380* 430* 189* 213* 192* 304*  BUN 38* 34* 33* 37* 38* 48*  CREATININE 1.22* 1.16* 1.01* 0.95 0.83 1.02*  CALCIUM 8.7* 8.7* 9.3 9.2 9.4 9.5  MG 2.3  --   --   --   --   --    Liver Function Tests: No results for input(s): AST, ALT, ALKPHOS, BILITOT, PROT, ALBUMIN  in the last 168 hours. CBG: Recent Labs  Lab 01/19/24 1121 01/19/24 1737 01/19/24 2140 01/20/24 0735 01/20/24 1137  GLUCAP 340* 253* 214* 79 246*    Discharge time spent: greater than 30 minutes.  Signed: Owen DELENA Lore, MD [facility] 01/20/2024

## 2024-01-20 NOTE — TOC Transition Note (Signed)
 Transition of Care [facility]) - Discharge Note  Patient Details  Name: Holly May MRN: [id number] Date of Birth: 1950-07-10  Transition of Care [facility]) CM/SW Contact:  Duwaine GORMAN Aran, LCSW Phone Number: 01/20/2024, 11:27 AM  Clinical Narrative: Patient will discharge home today. [employer] delivered rolling walker and travel oxygen  tank to room. HH orders in for PT/OT/aide. CSW notified Cindie with [name] of HH orders and discharge. Care management signing off.  Final next level of care: Home w Home Health Services Barriers to Discharge: Barriers Resolved  Patient Goals and CMS Choice Patient states their goals for this hospitalization and ongoing recovery are:: Get POC CMS Medicare.gov Compare Post Acute Care list provided to:: Patient Choice offered to / list presented to : Patient  Discharge Plan and Services Additional resources added to the After Visit Summary for   In-house Referral: Clinical Social Work Post Acute Care Choice: Durable Medical Equipment, Home Health          DME Arranged: Walker rolling, Other see comment (POC) DME Agency: [employer] Date DME Agency Contacted: 01/16/24 Time DME Agency Contacted: [phone] Representative spoke with at DME Agency: Mitch HH Arranged: PT, OT, Nurse's Aide HH Agency: [facility] Date [facility] Agency Contacted: 01/20/24 Time HH Agency Contacted: [phone] Representative spoke with at [facility] Agency: Cindie  Social Drivers of Health (SDOH) Interventions SDOH Screenings   Food Insecurity: Food Insecurity Present (01/14/2024)  Housing: Low Risk  (01/14/2024)  Transportation Needs: No Transportation Needs (01/14/2024)  Utilities: Not At Risk (01/14/2024)  Alcohol Screen: Low Risk  (07/30/2022)  Depression (PHQ2-9): Low Risk  (01/14/2024)  Financial Resource Strain: Medium Risk (11/04/2023)   Received from [facility]  Social Connections: Moderately Isolated (01/14/2024)  Tobacco Use: High Risk (01/14/2024)   Readmission Risk  Interventions    01/19/2024    2:30 PM  Readmission Risk Prevention Plan  Transportation Screening Complete  HRI or Home Care Consult Complete  Social Work Consult for Recovery Care Planning/Counseling Complete  Palliative Care Screening Not Applicable  Medication Review Oceanographer) Complete

## 2024-02-06 ENCOUNTER — Encounter: Payer: Self-pay | Admitting: Internal Medicine

## 2024-02-06 ENCOUNTER — Inpatient Hospital Stay: Admitting: Internal Medicine

## 2024-02-06 DIAGNOSIS — R918 Other nonspecific abnormal finding of lung field: Secondary | ICD-10-CM

## 2024-02-06 DIAGNOSIS — G4734 Idiopathic sleep related nonobstructive alveolar hypoventilation: Secondary | ICD-10-CM

## 2024-02-06 DIAGNOSIS — J449 Chronic obstructive pulmonary disease, unspecified: Secondary | ICD-10-CM

## 2024-02-06 NOTE — Progress Notes (Deleted)
 Holly May, female    DOB: 07-13-1950    MRN: [id number]   Brief patient profile:  73yobf  active smoker  referred to pulmonary clinic in [place]  04/05/2022 by Karna Lukes  for SPN in setting  variable doe attributed to  Copd / AB  x  2017    S/p thymomectomy by Fleeta Brooke 2020 s adjuctive rx with preop pfts c/w GOLD 3 copd    History of Present Illness  04/05/2022  Pulmonary/ 1st office eval/ Hubert Derstine / [facility] on acei and DPI  Chief Complaint  Patient presents with   Consult    SOB   Dyspnea:  mb and back 262ft  each way level and so short of breath  sometimes has to stop but varies s pattern  Cough: clear mucus variably producttive esp p lie down sometimes wakes her up p asleep/ flat 2 pillows  SABA use: 3 x weekly  02: none Rec We will walk you today to see if you qualify for 0xygen  Start Trelegy 100 one click each For cough / congestion > mucinex  1200 mg every 12 hours as needed  Levaquin  500 mg one daily x 10 days  CT chest needs to be done after Feb 3rd 2024  The key is to stop smoking completely before smoking completely stops you! 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 > SBRT  /turned down for resection by Kerrin   08/21/2022  f/u ov/[place] office/Emelin Dascenzo re: GOLD 3 copd  maint on trelegy  Chief Complaint  Patient presents with   Follow-up    Pt f/u states that shse is doing well she begins her cancer treatment next week, she does believe that her breathing is getting better  Dyspnea:  [employer] walking ok Cough: minimal / thinks she's no longer on acei  Sleeping: flat bed 3 pillows baseline  SABA use: 3 x weekly / neb rarely  02: none  Rec Call us  back with the spelling of the new blood pressure pill  Plan A = Automatic = Always=   Trelegy 100 one puff each am   Plan B = Backup (to supplement  plan A, not to replace it) Only use your albuterol  inhaler as a rescue  Plan C = Crisis (instead of Plan B but only if Plan B stops working) - only use your albuterol  nebulizer if you first try Plan  Please schedule a follow up visit in 3 months but call sooner if needed  with all medications /inhalers/ solutions in hand    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   02/11/2023  f/u ov/ office/Goebel Hellums re: GOLD 3  maint on trelegy 100  did  bring meds (includes lisinopril  40 and fosamax)  Chief Complaint  Patient presents with   COPD GOLD 3/ group E still smoking   Dyspnea:  ? A little better, still doing [employer]/ uses HC  Cough: worse since last ov esp when lie down > min mucoid Sleeping: flat bed/ 2 pillows s    resp cc  SABA use: q 2-3 days  02: none  Rec Stop lisinopril   Plan A = Automatic = Always=  Trelegy 100 one click each AM   - take your time! Work  on inhaler technique:  Plan B = Backup (to supplement plan A, not to replace it) Only use your albuterol  inhaler as a rescue medication  Plan C = Crisis (instead of Plan B but only if Plan B stops working) - only use your albuterol  nebulizer if you first try Plan B  Stop lisinopril  and replace with benicar  (olemsartan)  40 mg one daily   Prednisone  10 mg  2 daily until better and one daily for a week and stop     Admit date: 05/30/2023 Discharge date: 06/06/2023  73 y.o. female with medical history significant of hypertension, paroxysmal atrial fibrillation, PAF, severe COPD, diabetes mellitus type 2, lung adenocarcinoma on radiation therapy, tobacco abuse presents with worsening cough and chills.  [name] with sepsis due to combination of UTI and community-acquired pneumonia, COPD exacerbation, acute on chronic diastolic CHF along with atrial fibrillation with RVR.    Sepsis, present on admission secondary to UTI and community-acquired pneumonia -She has been  on admission, resolved at time of discharge, she was treated appropriately with IV antibiotics, x-ray significant with opacity at the right lung base, treated with appropriate antibiotic during hospital stay, no further antibiotic needed at time of discharge     Acute respiratory failure with hypoxia, due to community-acquired pneumonia, COP exacerbation and acute on chronic diastolic CHF -She is with hypoxia upon presentation, increased work of breathing, this has much improved, no oxygen  requirement at time of discharge . -She was treated with scheduled nebs treatment, as needed albuterol , and IV steroids, respiratory status back to baseline, she will be discharged on prednisone  taper .   Acute on chronic diastolic CHF.   -Echo noted EF preserved around 55%.  Was treated with IV diuresis, euvolemic at time of discharge    AKI On admission creatinine noted to be 1.2 with BUN 25.  Baseline creatinine previously noted to be 0.88-0.95.  Currently stable will continue to monitor ongoing diuresis.   Prolonged QT interval Acute.  QTc noted to be prolonged at 512.  Replaced electrolytes, Celexa  and mirtazapine held initially, QTc stable on repeat EKG 06/01/2022 at 470 ms,    Essential hypertension On low-dose beta-blocker now on oral Cardizem  for better heart rate control, Norvasc  has been changed, continue with home dose telmisartan    Hypomagnesemia. Hypokalemia  -replaced    Paroxysmal atrial fibrillation on chronic anticoagulation Went in RVR, initially required Cardizem  drip, transitioned to short acting Cardizem , she remained with intermittent A-fib with RVR during hospital stay, so Cardizem  regimen will be added on discharge, she will be discharged on Cardizem  CD. -Continue with home dose beta-blockers -continue with Eliquis  for anticoagulation -TSH mildly suppressed likely sick euthyroid, free T4 stable, continue Eliquis , stable echo.   History of lung cancer with evidence of lung  nodules on CT scan. Patient status post radiation treatments. -Patient was instructed to follow with her oncology team and pulmonary team as an outpatient, and she was counseled at length to stop smoking   Left hip pain secondary to fall Prior to arrival patient reported missing a step and falling landing on her left hip.  On physical exam significant bruising around the left hip.  X-ray of the pelvis negative, pain has improved, PT and monitor.   Hyperlipidemia -Continue with home statin   GERD -Pharmacy substitution of Protonix    Tobacco abuse She was counseled at length, and determined to stop smoking yet    Controlled diabetes mellitus type 2, without long-term use of insulin  CBG were elevated  during hospital stay, but overall her A1c is acceptable at 6, so I will hold on initiating any hypoglycemic medications as she will be on short steroid taper and I would anticipate her CBG to be back to normal after stopping steroids.   Discharge Diagnoses:  Principal Problem:   Sepsis (HCC)   Urinary tract infection   CAP (community acquired pneumonia)   COPD exacerbation (HCC)   Acute respiratory failure with hypoxia (HCC)   Renal insufficiency   Prolonged QT interval   Essential hypertension   History of lung cancer   DM type 2 (diabetes mellitus, type 2) (HCC)   Left hip pain   Fall at home, initial encounter   GERD (gastroesophageal reflux disease)   Cigarette smoker       08/29/2023  f/u ov/[place] office/Marcille Barman re: GOLD 3 / 02 prn  maint on BREO  did not bring all meds/ still smoking  Chief Complaint  Patient presents with   COPD  Dyspnea:  back again walking at [employer] with [facility] parking s 02  Cough: minimal esp in am and hs mostly clear  Sleeping: flat bed 2 pillows s  resp cc  SABA use: neb twice daily  02: 2lpm hs but not every night  /all nigh Lung cancer screening:  Dr MARLA  Rec Plan A = Automatic = Always=    BRE0 100 one click each am  Plan B = Backup (to  supplement plan A, not to replace it) Only use your albuterol  inhaler as a rescue medication  Plan C = Crisis (instead of Plan B but only if Plan B stops working) - only use your albuterol  nebulizer if you first try Plan B   The key is to stop smoking completely before smoking completely stops you!   Admit Date: 11/03/2023  Discharge Date: 11/07/2023    Discharge Diagnoses:  Principal Problem (Resolved):   Atrial fibrillation with RVR    (POA: Unknown)   Alcohol intoxication (POA: Yes)   COPD (chronic obstructive pulmonary disease)    (POA: Unknown)   Chronic hypoxic respiratory failure    (POA: Unknown)   Hypertension (POA: Unknown)   Tobacco use disorder (POA: Yes) Hospital Course:    Twanna Resh is a 74 y.o. female whose presentation is complicated by alcohol use disorder, COPD, HTN, T2DM, depression, adenocarcinoma of LUL, diastolic heart failure, and paroxysmal afib who presented to [facility] for Afib with RVR and acute alcohol intoxication.    Atrial Fibrillation w/ RVR EKG initially showing AFib with rates in the 140s.  She does have a history of paroxysmal atrial fibrillation for which she takes bisoprolol  and Eliquis . Rate controlled with increased metoprolol  doses while inpatient. Suspect this is secondary to acute alcohol intoxication. Discharged on home Eliquis  5 mg BID and increased bisoprolol  to 10 mg daily.    Chronic hypoxic respiratory failure  COPD Patient is on Trelegy at home. She occasionally uses 2L O2 at home with activity but normally does not need at rest. She states her current cough/dyspnea is consistent with baseline. Given lack of increased cough or sputum changes, low concern for acute COPD exacerbation. Continue home Trelegy and supplemental O2 as needed.    BLE edema R>L New asymmetric lower extremity pitting edema. Patient denies leg pain/tenderness. BLE PVL 8/5 with no evidence of DVT in RLE or LLE.    Acute Alcohol Intoxication  Alcohol use  disorder  Blood EtOH  on admission 297. Reports no history of complicated withdrawal. Pt  previously was able to stop drinking for 9 months. She is not interested in inpatient rehab but is interested in cessation. She did not require any CIWA PRNs while admitted. Discharged on naltrexone 50 mg daily. Recommended AA meetings.    Transaminitis (improved) ALT 49, AST 161, Alk Phos 163 on admission. Patient has no known history of liver disease/cirrhosis or biliary disease. No RUQ pain/tenderness. Pattern is most consistent with alcohol related liver injury. LFTs down trended during admission.    Chronic Problems   CAD s/p CABG: Continue home lovastatin 40 mg daily Hypertension: Continue home amlodipine  5 mg, hydrochlorothiazide  25 mg daily, and olmesartan  40 mg daily Abdominal aortic fusiform dilatation: Incidental finding on CTA PE of a fusiform dilatation of the suprarenal abdominal aorta measuring up to 4.1 cm. She is not having any chest or abdominal pain. 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 F/up abdominal US  in 1 yr for surveillance per Society of Vascular Surgery guidelines. Follow up MRI for renal lesion in 3 months.  Type II Diabetes Mellitus: Not currently on any pharmacotherapies. Received minimal SSI while inpatient.   Tobacco Use Disorder: Encouraged tobacco cessation. Nicotine  patches and gum PRN while inpatient. Depression: Continue home citalopram  20 mg daily and mirtazapine 15 mg at bedtime. Osteoporosis: Continue home Fosamax 70 mg weekly. OAB: Continue home oxybutynin  5 mg TID. Pain: Continue home gabapentin  300 mg at bedtime.     11/27/2023  f/u ov/[place] office/Linnette Panella re: GOLD 3 / 02 prn   maint on Trelegy but not affordable  Chief Complaint  Patient presents with   COPD    Coughing w clear mucus Shob worse then usual   Dyspnea:  walking at [employer] s 02  Cough: better / min clear mucus   Sleeping: bed is flat 2 pillows s    resp cc  SABA use: about twic duone  02: 2lpm @ hs / not consistent with it/ no using daytime Lung cancer screening: follow by oncology  Rec Plan A = Automatic = Always=    Change   duoneb to 2-4 x daily with budesonide  0.25 with each treatment  Plan B = Backup (to supplement plan A, not to replace it) Use your albuterol  inhaler as a rescue medication  Plan C = Crisis (instead of Plan B but only if Plan B stops working) Plan D  if ABC not  working add Prednisone  10 mg x 2 each am until better then 1 daily x 5 days and stop (refillable) Please schedule a follow up visit in 6 months but call sooner if needed    01/12/2024  f/u ov/[place] office/Mischell Branford re: GOLD 3 copd  maint on trelegy 100 and back up neb but not using / following ABCD plan above Chief Complaint  Patient presents with   COPD    Abn ct from [facility]  Dyspnea:  very hard to understand   - very sedentary, thinks she needs amb 02  Cough: none  Sleeping: bed is flat / 2 pillows s  resp cc  SABA use: 2 x daily  02: concentrator on 2lpm hs  and  as needed during the day  Patient Instructions  My office will be contacting you by phone for referral to oncology at [facility]   - if you don't hear back from my office within one week please call us  back or notify us  thru MyChart and we'll address it right away.  Plan A =  Automatic = Always=    Trelegy one click each am  Plan B = Backup (to supplement plan A, not to replace it) Use your albuterol  inhaler as a rescue medication  Plan C  -  if A and B not working add nebulizer with duoneb  plus budesonide  0.25 mg up to every 4 hours if needed Plan D  if ABC not working : add   prednisone  10 m x 2 daily until better then one daily for 5 days and stop   Please schedule a follow up visit in 3 months but call sooner if needed  with all RESPIRATORY medications /inhalers/ solutions in hand    Admit date:     01/14/2024  Discharge date: 01/20/24     Recommendations at discharge:  Needs to follow up with oncology further care of Lung cancer.  Follow Bmet to monitor electrolytes.  Started synthroid, needs TSH    Discharge Diagnoses: Principal Problem:   Atrial fibrillation with RVR [facility])       Hospital Course: 73 year old with past medical history significant for lung cancer, COPD on home oxygen , A-fib, depression, hypertension, diabetes type 2 sent from cancer center for evaluation of hypoxia.  Patient uses 2 L of oxygen  at home in the office she was seen noted to have oxygen  saturation in the 80s.  Patient has a history of A-fib she was noted to be tachycardic in the office as well.  She was treated outside hospital for pneumonia recently and discharged on Friday.  She is still taking his steroids.   Evaluation in the ED she received breathing treatment and IV steroid for wheezing.  Started on Cardizem  drip.  proBNP noted to be 3000 troponin 21.  Chest x-ray negative for acute cardiopulmonary abnormality.     Patient admitted for COPD exacerbation and A-fib with RVR     Assessment and Plan: 1-A-fib with RVR -Suspect exacerbated by lung issues.  - Continue with Eliquis  -Continue with  oral Cardizem , bisoprolol /  Stable.    COPD exacerbation -Continue with  guaifenesin  -Continue with ceftriaxone  and doxycycline , treated for 5 days.  -Continue with  Brovana  and Pulmicort  -Continue DuoNeb -Counseling in regards to smoking cessation -IV solumedrol--change to prednisone , discharge on 5 days.  -10/18: She was more tachypnea, extra breathing tx, chest x ray showed right lower lobe consolidation, incentive spirometry, respiratory toilet. CCM consulted.  -Considering episode of aspirations. Speech consulted. IV lasix  resume  -monitor on oral prednisone .  Resume oral lasix   Stable for discharge   Diabetes type 2  Hyperglycemia - Suspect in the setting of a steroid - A1c 8 She will need meds or insulin  at discharge Continue  Lantus  and sliding scale and meal coverage.     Hyperkalemia:  Treated with  Lokelma   Lung cancer, left upper lobe adenocarcinoma.  - Continue follow-up outpatient   Hypertension - Continue with Cardizem    Hyperlipidemia - Resume statin   CKD stage III - Monitor renal function   Leukocytosis in setting steroids.  Chronic heart failure preserved ejection fraction - resume lasix .    Low TSH: Free T3 Pending free T4: 0.58.  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  She will need repeat TSH and free T4 in 4 weeks     Overreactive bladder; Oxybutynin .        02/06/2024  f/u ov/[place] office/Jeralynn Vaquera re: GOLD 3 COPD  maint on ***  *** still smoking  No chief complaint on  file.   Dyspnea:  *** Cough: *** Sleeping: ***   resp cc  SABA use: *** 02: ***  Lung cancer screening: ***   No obvious day to day or daytime variability or assoc excess/ purulent sputum or mucus plugs or hemoptysis or cp or chest tightness, subjective wheeze or overt sinus or hb symptoms.    Also denies any obvious fluctuation of symptoms with weather or environmental changes or other aggravating or alleviating factors except as outlined above   No unusual exposure hx or h/o childhood pna/ asthma or knowledge of premature birth.  Current Allergies, Complete Past Medical History, Past Surgical History, Family History, and Social History were reviewed in [employer] record.  ROS  The following are not active complaints unless bolded Hoarseness, sore throat, dysphagia, dental problems, itching, sneezing,  nasal congestion or discharge of excess mucus or purulent secretions, ear ache,   fever, chills, sweats, unintended wt loss or wt gain, classically pleuritic or exertional cp,  orthopnea pnd or arm/hand swelling  or leg swelling, presyncope, palpitations, abdominal pain, anorexia, nausea, vomiting, diarrhea  or change in bowel  habits or change in bladder habits, change in stools or change in urine, dysuria, hematuria,  rash, arthralgias, visual complaints, headache, numbness, weakness or ataxia or problems with walking or coordination,  change in mood or  memory.        No outpatient medications have been marked as taking for the 02/06/24 encounter (Appointment) with Darlean Ozell NOVAK, MD.          Past Medical History:  Diagnosis Date   Arthritis    COPD (chronic obstructive pulmonary disease) (HCC)    Depression    Dyspnea    Essential hypertension, benign    Type 2 diabetes mellitus (HCC)    Urinary incontinence        Objective:    Wts  02/06/2024           ***  01/12/2024       157  11/27/2023         144  08/29/2023         144  03/18/2023       141   02/11/2023       140  12/31/2022        136   08/21/2022        143  05/16/22 135 lb 12.8 oz (61.6 kg)  05/14/22 134 lb (60.8 kg)  04/05/22 134 lb 6.4 oz (61 kg)    Vital signs reviewed  02/06/2024  - Note at rest 02 sats  ***% on ***   General appearance:    ***   ,  Mod barrel d 1+ pitting RLE/ trace on L ***        Assessment

## 2024-02-09 ENCOUNTER — Ambulatory Visit: Admitting: Internal Medicine

## 2024-02-09 ENCOUNTER — Inpatient Hospital Stay: Admitting: Nurse Practitioner

## 2024-02-09 ENCOUNTER — Encounter: Payer: Self-pay | Admitting: Internal Medicine

## 2024-02-09 VITALS — BP 110/66 | HR 70 | Ht 67.0 in | Wt 160.6 lb

## 2024-02-09 DIAGNOSIS — F17210 Nicotine dependence, cigarettes, uncomplicated: Secondary | ICD-10-CM

## 2024-02-09 DIAGNOSIS — J449 Chronic obstructive pulmonary disease, unspecified: Secondary | ICD-10-CM | POA: Diagnosis not present

## 2024-02-09 DIAGNOSIS — G4734 Idiopathic sleep related nonobstructive alveolar hypoventilation: Secondary | ICD-10-CM

## 2024-02-09 DIAGNOSIS — R918 Other nonspecific abnormal finding of lung field: Secondary | ICD-10-CM

## 2024-02-09 DIAGNOSIS — R0902 Hypoxemia: Secondary | ICD-10-CM | POA: Diagnosis not present

## 2024-02-09 NOTE — Assessment & Plan Note (Addendum)
 5   min discussion re active cigarette smoking in addition to office E&M  Ask about tobacco use:   ongoing Advise quitting   matter of life and breath at this point  Assess willingness:  Not committed at this point Assist in quit attempt:  Per PCP when ready Arrange follow up:   Follow up per Primary Care planned         Total time for H and P, chart review, counseling, reviewing neb/02 / pulse ox  device(s) and generating customized AVS unique to this office visit / same day charting = 35 min

## 2024-02-09 NOTE — Patient Instructions (Addendum)
 No change in medications but there is no  reason to return here until / unless we can be  sure what medications you are taking and which ones you are taking daily vs as needed  Ok to adjust your 02 to keep your oxygen  level on your finger around 90%  or better on whatever equipment your are on - remember to turn it back down if you are having to turn it up to walking   The key is to stop smoking completely before smoking completely stops you!     Please schedule a follow up office visit in 4 weeks, call sooner if needed with all medications /inhalers/ solutions in hand so we can verify exactly what you are taking. This includes all medications from all doctors and over the counters - PLEASE separate them into two bags:  the ones you take automatically, no matter what, vs the ones you take just when you feel you need them BAG #2 is UP TO YOU  - this will really help us  help you take your medications more effectively.

## 2024-02-09 NOTE — Assessment & Plan Note (Addendum)
 Active smoker - PFT's  02/16/2019  FEV1 0.87 (39 % ) ratio 0.39  p 7 % improvement from saba p ? prior to study with DLCO  10.88 (49%)   and FV curve classically concave  - 04/05/2022  After extensive coaching inhaler device,  effectiveness =    75% with dpi > trelegy trial  - d/c ACE 04/05/2022  - 04/05/2022   Walked on RA  x  3  lap(s) =  approx 450  ft  @ mod pace, stopped due to end of study with lowest 02 sats 95% no sob   - PFT's  06/03/22  FEV1 0.95 (38 % ) ratio 0.54  p 0 % improvement from saba p 0 prior to study with DLCO  10.31 (49%)   and FV curve mildly concave/somewhat atyical and ERV 6% at wt 144   - 02/11/2023  After extensive coaching inhaler device,  effectiveness =    75% with dpi, 25% with hfa  - 02/11/2023 try pred 20 until better then 10 mg daily x 5 d and off>>> much better  - 02/11/2023    d/c acei  - 11/27/2023 new ABC with A= duoneb/bud 0.25 and C = prednisone   20 until better then 10 mg daily x 5 d and off - 01/12/2024  After extensive coaching inhaler device,  effectiveness =    50% with hfa and dpi so continue duoneb/bud 0.25 mg up to 4 x daily as plan D  Very little to offer this pt who doesn't bring her meds or know her meds or inhalers/ nebs  - she lives off of blister packs but these won't work for resp rx and not sure what intructions the pharmacy is using to fill them so asked her to return with the blister pack and all daily meds in one bag and all the others in a second bag  using a trust but verify approach to confirm accurate Medication  Reconciliation.  The principal here is that until we are certain that the  patients are doing what we've asked, it makes no sense to ask them to do more.

## 2024-02-09 NOTE — Assessment & Plan Note (Addendum)
 As of 08/29/2023 on 2lpm hs  - 08/29/2023   Walked on RA  x  2  lap(s) =  approx 300  ft  @ slow pace, stopped due to sob  with lowest 02 sats 97%   - 01/12/2024   Walked on RA  x  2  lap(s) =  approx 300  ft  @ slow  pace, stopped due to knee pain  with lowest 02 sats  89%   Rec 3lpm hs and titrate daytime to keep sats >90%

## 2024-02-09 NOTE — Progress Notes (Signed)
 Holly May, female    DOB: 02-24-1951    MRN: [id number]   Brief patient profile:  89  yobf  active smoker  referred to pulmonary clinic in [place]  04/05/2022 by Karna Lukes  for SPN in setting  variable doe attributed to  Copd / AB  x  2017 .  S/p thymectomy by Fleeta Brooke 2020 s adjuctive rx with preop pfts c/w GOLD 3 copd    History of Present Illness  04/05/2022  Pulmonary/ 1st office eval/ Kensly Bowmer / [place] [facility] on acei and DPI  Chief Complaint  Patient presents with   Consult    SOB   Dyspnea:  mb and back 243ft  each way level and so short of breath  sometimes has to stop but varies s pattern  Cough: clear mucus variably producttive esp p lie down sometimes wakes her up p asleep/ flat 2 pillows  SABA use: 3 x weekly  02: none Rec We will walk you today to see if you qualify for 0xygen  Start Trelegy 100 one click each For cough / congestion > mucinex  1200 mg every 12 hours as needed  Levaquin  500 mg one daily x 10 days  CT chest needs to be done after Feb 3rd 2024  The key is to stop smoking completely before smoking completely stops you! 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 > SBRT  /turned down for resection by Kerrin   08/21/2022  f/u ov/[place] office/Makel Mcmann re: GOLD 3 copd  maint on trelegy  Chief Complaint  Patient presents with   Follow-up    Pt f/u states that shse is doing well she begins her cancer treatment next week, she does believe that her breathing is getting better  Dyspnea:  [employer] walking ok Cough: minimal / thinks she's no longer on acei  Sleeping: flat bed 3 pillows baseline  SABA use: 3 x weekly / neb rarely  02: none  Rec Call us  back with the spelling of the new blood pressure pill  Plan A = Automatic = Always=   Trelegy 100 one puff each am   Plan B = Backup (to supplement  plan A, not to replace it) Only use your albuterol  inhaler as a rescue  Plan C = Crisis (instead of Plan B but only if Plan B stops working) - only use your albuterol  nebulizer if you first try Plan  Please schedule a follow up visit in 3 months but call sooner if needed  with all medications /inhalers/ solutions in hand    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   02/11/2023  f/u ov/ office/Jaena Brocato re: GOLD 3  maint on trelegy 100  did  bring meds (includes lisinopril  40 and fosamax)  Chief Complaint  Patient presents with   COPD GOLD 3/ group E still smoking   Dyspnea:  ? A little better, still doing [employer]/ uses HC  Cough: worse since last ov esp when lie down > min mucoid Sleeping: flat bed/ 2 pillows s    resp cc  SABA use: q 2-3 days  02: none  Rec Stop lisinopril   Plan A = Automatic = Always=  Trelegy 100 one click each AM   - take your time!  Work on inhaler technique:  Plan B = Backup (to supplement plan A, not to replace it) Only use your albuterol  inhaler as a rescue medication  Plan C = Crisis (instead of Plan B but only if Plan B stops working) - only use your albuterol  nebulizer if you first try Plan B  Stop lisinopril  and replace with benicar  (olemsartan)  40 mg one daily   Prednisone  10 mg  2 daily until better and one daily for a week and stop     Admit date: 05/30/2023 Discharge date: 06/06/2023  73 y.o. female with medical history significant of hypertension, paroxysmal atrial fibrillation, PAF, severe COPD, diabetes mellitus type 2, lung adenocarcinoma on radiation therapy, tobacco abuse presents with worsening cough and chills.  [name] with sepsis due to combination of UTI and community-acquired pneumonia, COPD exacerbation, acute on chronic diastolic CHF along with atrial fibrillation with RVR.    Sepsis, present on admission secondary to UTI and community-acquired pneumonia -She has been  on admission, resolved at time of discharge, she was treated appropriately with IV antibiotics, x-ray significant with opacity at the right lung base, treated with appropriate antibiotic during hospital stay, no further antibiotic needed at time of discharge     Acute respiratory failure with hypoxia, due to community-acquired pneumonia, COP exacerbation and acute on chronic diastolic CHF -She is with hypoxia upon presentation, increased work of breathing, this has much improved, no oxygen  requirement at time of discharge . -She was treated with scheduled nebs treatment, as needed albuterol , and IV steroids, respiratory status back to baseline, she will be discharged on prednisone  taper .   Acute on chronic diastolic CHF.   -Echo noted EF preserved around 55%.  Was treated with IV diuresis, euvolemic at time of discharge    AKI On admission creatinine noted to be 1.2 with BUN 25.  Baseline creatinine previously noted to be 0.88-0.95.  Currently stable will continue to monitor ongoing diuresis.   Prolonged QT interval Acute.  QTc noted to be prolonged at 512.  Replaced electrolytes, Celexa  and mirtazapine held initially, QTc stable on repeat EKG 06/01/2022 at 470 ms,    Essential hypertension On low-dose beta-blocker now on oral Cardizem  for better heart rate control, Norvasc  has been changed, continue with home dose telmisartan    Hypomagnesemia. Hypokalemia  -replaced    Paroxysmal atrial fibrillation on chronic anticoagulation Went in RVR, initially required Cardizem  drip, transitioned to short acting Cardizem , she remained with intermittent A-fib with RVR during hospital stay, so Cardizem  regimen will be added on discharge, she will be discharged on Cardizem  CD. -Continue with home dose beta-blockers -continue with Eliquis  for anticoagulation -TSH mildly suppressed likely sick euthyroid, free T4 stable, continue Eliquis , stable echo.   History of lung cancer with evidence of lung  nodules on CT scan. Patient status post radiation treatments. -Patient was instructed to follow with her oncology team and pulmonary team as an outpatient, and she was counseled at length to stop smoking   Left hip pain secondary to fall Prior to arrival patient reported missing a step and falling landing on her left hip.  On physical exam significant bruising around the left hip.  X-ray of the pelvis negative, pain has improved, PT and monitor.   Hyperlipidemia -Continue with home statin   GERD -Pharmacy substitution of Protonix    Tobacco abuse She was counseled at length, and determined to stop smoking yet    Controlled diabetes mellitus type 2, without long-term use of insulin  CBG were  elevated during hospital stay, but overall her A1c is acceptable at 6, so I will hold on initiating any hypoglycemic medications as she will be on short steroid taper and I would anticipate her CBG to be back to normal after stopping steroids.   Discharge Diagnoses:  Principal Problem:   Sepsis (HCC)   Urinary tract infection   CAP (community acquired pneumonia)   COPD exacerbation (HCC)   Acute respiratory failure with hypoxia (HCC)   Renal insufficiency   Prolonged QT interval   Essential hypertension   History of lung cancer   DM type 2 (diabetes mellitus, type 2) (HCC)   Left hip pain   Fall at home, initial encounter   GERD (gastroesophageal reflux disease)   Cigarette smoker       08/29/2023  f/u ov/[place] office/Ashutosh Dieguez re: GOLD 3 / 02 prn  maint on BREO  did not bring all meds/ still smoking  Chief Complaint  Patient presents with   COPD  Dyspnea:  back again walking at [employer] with [facility] parking s 02  Cough: minimal esp in am and hs mostly clear  Sleeping: flat bed 2 pillows s  resp cc  SABA use: neb twice daily  02: 2lpm hs but not every night  /all nigh Lung cancer screening:  Dr MARLA  Rec Plan A = Automatic = Always=    BRE0 100 one click each am  Plan B = Backup (to  supplement plan A, not to replace it) Only use your albuterol  inhaler as a rescue medication  Plan C = Crisis (instead of Plan B but only if Plan B stops working) - only use your albuterol  nebulizer if you first try Plan B   The key is to stop smoking completely before smoking completely stops you!   Admit Date: 11/03/2023  Discharge Date: 11/07/2023    Discharge Diagnoses:  Principal Problem (Resolved):   Atrial fibrillation with RVR    (POA: Unknown)   Alcohol intoxication (POA: Yes)   COPD (chronic obstructive pulmonary disease)    (POA: Unknown)   Chronic hypoxic respiratory failure    (POA: Unknown)   Hypertension (POA: Unknown)   Tobacco use disorder (POA: Yes) Hospital Course:    Joeanna Howdyshell is a 73 y.o. female whose presentation is complicated by alcohol use disorder, COPD, HTN, T2DM, depression, adenocarcinoma of LUL, diastolic heart failure, and paroxysmal afib who presented to [facility] for Afib with RVR and acute alcohol intoxication.    Atrial Fibrillation w/ RVR EKG initially showing AFib with rates in the 140s.  She does have a history of paroxysmal atrial fibrillation for which she takes bisoprolol  and Eliquis . Rate controlled with increased metoprolol  doses while inpatient. Suspect this is secondary to acute alcohol intoxication. Discharged on home Eliquis  5 mg BID and increased bisoprolol  to 10 mg daily.    Chronic hypoxic respiratory failure  COPD Patient is on Trelegy at home. She occasionally uses 2L O2 at home with activity but normally does not need at rest. She states her current cough/dyspnea is consistent with baseline. Given lack of increased cough or sputum changes, low concern for acute COPD exacerbation. Continue home Trelegy and supplemental O2 as needed.    BLE edema R>L New asymmetric lower extremity pitting edema. Patient denies leg pain/tenderness. BLE PVL 8/5 with no evidence of DVT in RLE or LLE.    Acute Alcohol Intoxication  Alcohol use  disorder  Blood EtOH  on admission 297. Reports no history of complicated withdrawal.  Pt previously was able to stop drinking for 9 months. She is not interested in inpatient rehab but is interested in cessation. She did not require any CIWA PRNs while admitted. Discharged on naltrexone 50 mg daily. Recommended AA meetings.    Transaminitis (improved) ALT 49, AST 161, Alk Phos 163 on admission. Patient has no known history of liver disease/cirrhosis or biliary disease. No RUQ pain/tenderness. Pattern is most consistent with alcohol related liver injury. LFTs down trended during admission.    Chronic Problems   CAD s/p CABG: Continue home lovastatin 40 mg daily Hypertension: Continue home amlodipine  5 mg, hydrochlorothiazide  25 mg daily, and olmesartan  40 mg daily Abdominal aortic fusiform dilatation: Incidental finding on CTA PE of a fusiform dilatation of the suprarenal abdominal aorta measuring up to 4.1 cm. She is not having any chest or abdominal pain. 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 F/up abdominal US  in 1 yr for surveillance per Society of Vascular Surgery guidelines. Follow up MRI for renal lesion in 3 months.  Type II Diabetes Mellitus: Not currently on any pharmacotherapies. Received minimal SSI while inpatient.   Tobacco Use Disorder: Encouraged tobacco cessation. Nicotine  patches and gum PRN while inpatient. Depression: Continue home citalopram  20 mg daily and mirtazapine 15 mg at bedtime. Osteoporosis: Continue home Fosamax 70 mg weekly. OAB: Continue home oxybutynin  5 mg TID. Pain: Continue home gabapentin  300 mg at bedtime.     11/27/2023  f/u ov/[place] office/Abdur Hoglund re: GOLD 3 / 02 prn   maint on Trelegy but not affordable  Chief Complaint  Patient presents with   COPD    Coughing w clear mucus Shob worse then usual   Dyspnea:  walking at [employer] s 02  Cough: better / min clear mucus   Sleeping: bed is flat 2 pillows s    resp cc  SABA use: about twic duone  02: 2lpm @ hs / not consistent with it/ no using daytime Lung cancer screening: follow by oncology  Rec Plan A = Automatic = Always=    Change   duoneb to 2-4 x daily with budesonide  0.25 with each treatment  Plan B = Backup (to supplement plan A, not to replace it) Use your albuterol  inhaler as a rescue medication  Plan C = Crisis (instead of Plan B but only if Plan B stops working) Plan D  if ABC not  working add Prednisone  10 mg x 2 each am until better then 1 daily x 5 days and stop (refillable) Please schedule a follow up visit in 6 months but call sooner if needed    01/12/2024  f/u ov/[place] office/Chrishawn Boley re: GOLD 3 copd  maint on trelegy 100 and back up neb but not using / following ABCD plan above Chief Complaint  Patient presents with   COPD    Abn ct from [facility]  Dyspnea:  very hard to understand   - very sedentary, thinks she needs amb 02  Cough: none  Sleeping: bed is flat / 2 pillows s  resp cc  SABA use: 2 x daily  02: concentrator on 2lpm hs  and  as needed during the day  Patient Instructions  My office will be contacting you by phone for referral to oncology at [facility]   - if you don't hear back from my office within one week please call us  back or notify us  thru MyChart and we'll address it right away.  Plan  A = Automatic = Always=    Trelegy one click each am  Plan B = Backup (to supplement plan A, not to replace it) Use your albuterol  inhaler as a rescue medication  Plan C  -  if A and B not working add nebulizer with duoneb  plus budesonide  0.25 mg up to every 4 hours if needed Plan D  if ABC not working : add   prednisone  10 m x 2 daily until better then one daily for 5 days and stop  Please schedule a follow up visit in 3 months but call sooner if needed  with all RESPIRATORY medications /inhalers/ solutions in hand      Admit date:     01/14/2024  Discharge date: 01/20/24     Recommendations at discharge:  Needs to follow up with oncology further care of Lung cancer.  Follow Bmet to monitor electrolytes.  Started synthroid, needs TSH    Discharge Diagnoses: Principal Problem:   Atrial fibrillation with RVR [facility])       Hospital Course: 73 year old with past medical history significant for lung cancer, COPD on home oxygen , A-fib, depression, hypertension, diabetes type 2 sent from cancer center for evaluation of hypoxia.  Patient uses 2 L of oxygen  at home in the office she was seen noted to have oxygen  saturation in the 80s.  Patient has a history of A-fib she was noted to be tachycardic in the office as well.  She was treated outside hospital for pneumonia recently and discharged on Friday.  She is still taking his steroids.   Evaluation in the ED she received breathing treatment and IV steroid for wheezing.  Started on Cardizem  drip.  proBNP noted to be 3000 troponin 21.  Chest x-ray negative for acute cardiopulmonary abnormality.     Patient admitted for COPD exacerbation and A-fib with RVR     Assessment and Plan: 1-A-fib with RVR -Suspect exacerbated by lung issues.  - Continue with Eliquis  -Continue with  oral Cardizem , bisoprolol /      COPD exacerbation -Continue with  guaifenesin  -Continue with ceftriaxone  and doxycycline , treated for 5 days.  -Continue with  Brovana  and Pulmicort  -Continue DuoNeb -Counseling in regards to smoking cessation -IV solumedrol--change to prednisone , discharge on 5 days.  -10/18: She was more tachypnea, extra breathing tx, chest x ray showed right lower lobe consolidation, incentive spirometry, respiratory toilet. CCM consulted.  -Considering episode of aspirations. Speech consulted. IV lasix  resume  -monitor on oral prednisone .  Resume oral lasix   Stable for discharge   Diabetes type 2  Hyperglycemia - Suspect in the setting of a steroid - A1c 8 She will need meds or insulin  at discharge Continue Lantus   and sliding scale and meal coverage.     Hyperkalemia:  Treated with  Lokelma   Lung cancer, left upper lobe adenocarcinoma.  - Continue follow-up outpatient   Hypertension - Continue with Cardizem    Hyperlipidemia - Resume statin   CKD stage III - Monitor renal function   Leukocytosis in setting steroids.  Chronic heart failure preserved ejection fraction - resume lasix .    Low TSH: Free T3 Pending free T4: 0.58.  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  She will need repeat TSH and free T4 in 4 weeks     Overreactive bladder; Oxybutynin .     02/09/2024  f/u ov/[place] office/Caryl Manas re: GOLD 3 COPD  maint on 2lpm hs and prn daytime    still smoking  and did not bring meds as requested / does not know any names ? On duoneb qid/ bud bid ?  Chief Complaint  Patient presents with   Hospitalization Follow-up    pna   COPD  Dyspnea:  room  to room  Cough:  lots of congestion > light brown nl for pt  Sleeping: bed is flat/ 2 pillows s resp cc  SABA use: has nebulizer not using  02: 3lpm  24/7 does not titrate with pulse ox    No obvious day to day or daytime variability or assoc  mucus plugs or hemoptysis or cp or chest tightness, subjective wheeze or overt sinus or hb symptoms.    Also denies any obvious fluctuation of symptoms with weather or environmental changes or other aggravating or alleviating factors except as outlined above   No unusual exposure hx or h/o childhood pna/ asthma or knowledge of premature birth.  Current Allergies, Complete Past Medical History, Past Surgical History, Family History, and Social History were reviewed in [employer] record.  ROS  The following are not active complaints unless bolded Hoarseness, sore throat, dysphagia, dental problems, itching, sneezing,  nasal congestion or discharge of excess mucus or purulent secretions, ear ache,   fever, chills, sweats,  unintended wt loss or wt gain, classically pleuritic or exertional cp,  orthopnea pnd or arm/hand swelling  or leg swelling, presyncope, palpitations, abdominal pain, anorexia, nausea, vomiting, diarrhea  or change in bowel habits or change in bladder habits, change in stools or change in urine, dysuria, hematuria,  rash, arthralgias, visual complaints, headache, numbness, weakness or ataxia or problems with walking or coordination,  change in mood or  memory.        Current Meds  Medication Sig   albuterol  (PROVENTIL  HFA;VENTOLIN  HFA) 108 (90 Base) MCG/ACT inhaler Inhale 2 puffs into the lungs every 4 (four) hours as needed for wheezing or shortness of breath (cough, shortness of breath or wheezing.).   apixaban  (ELIQUIS ) 5 MG TABS tablet Take 1 tablet (5 mg total) by mouth 2 (two) times daily.   bisoprolol  (ZEBETA ) 10 MG tablet Take 1 tablet (10 mg total) by mouth daily.   Blood Glucose Monitoring Suppl DEVI 1 each by Does not apply route as directed. Dispense based on patient and insurance preference. Use up to four times daily as directed. (FOR ICD-10 E10.9, E11.9).   budesonide  (PULMICORT ) 0.25 MG/2ML nebulizer solution One vial with each duoneb up to 4 x daily (Patient taking differently: Take 0.25 mg by nebulization 4 (four) times daily as needed (shortness of breath, wheezing). One vial with each duoneb up to 4 x daily)   citalopram  (CELEXA ) 40 MG tablet Take 40 mg by mouth daily.   diltiazem  (CARDIZEM  CD) 240 MG 24 hr capsule Take 1 capsule (240 mg total) by mouth daily.   furosemide  (LASIX ) 20 MG tablet Take 1 tablet (20 mg total) by mouth 2 (two) times daily.   gabapentin  (NEURONTIN ) 300 MG capsule Take 300 mg by mouth at bedtime.   glipiZIDE (GLUCOTROL) 5 MG tablet Take 1 tablet (5 mg total) by mouth 2 (two) times daily before a meal.   Glucose Blood (BLOOD GLUCOSE TEST STRIPS) STRP 1 each by Does not apply route as directed. Dispense based on patient and insurance preference. Use up to  four times daily as directed. (FOR ICD-10 E10.9, E11.9).   guaiFENesin  (MUCINEX ) 600 MG 12 hr tablet Take 2 tablets (1,200 mg total) by mouth 2 (two) times daily.  HYDROcodone -acetaminophen  (NORCO/VICODIN) 5-325 MG tablet Take 1-2 tablets by mouth every 4 (four) hours as needed for moderate pain (pain score 4-6).   ipratropium-albuterol  (DUONEB) 0.5-2.5 (3) MG/3ML SOLN Take 3 mLs by nebulization every 6 (six) hours as needed (wheezing, shortness of breath).   Lancet Device MISC 1 each by Does not apply route as directed. Dispense based on patient and insurance preference. Use up to four times daily as directed. (FOR ICD-10 E10.9, E11.9).   Lancets MISC 1 each by Does not apply route as directed. Dispense based on patient and insurance preference. Use up to four times daily as directed. (FOR ICD-10 E10.9, E11.9).   levothyroxine (SYNTHROID) 25 MCG tablet Take 1 tablet (25 mcg total) by mouth daily before breakfast.   lovastatin (MEVACOR) 40 MG tablet Take 40 mg by mouth at bedtime.   mirtazapine (REMERON) 15 MG tablet Take 15 mg by mouth at bedtime. For appetite   naltrexone (DEPADE) 50 MG tablet Take 25 mg by mouth daily.   nicotine  (NICODERM CQ  - DOSED IN MG/24 HOURS) 21 mg/24hr patch Place 1 patch (21 mg total) onto the skin daily as needed (craving smoking).   olmesartan  (BENICAR ) 40 MG tablet Take 1 tablet (40 mg total) by mouth daily.   oxybutynin  (DITROPAN ) 5 MG tablet Take 5 mg by mouth 3 (three) times daily.   pantoprazole  (PROTONIX ) 40 MG tablet Take 1 tablet (40 mg total) by mouth 2 (two) times daily.   potassium chloride  SA (KLOR-CON  M) 20 MEQ tablet TAKE ONE TABLET BY MOUTH ONCE DAILY   TRELEGY ELLIPTA 100-62.5-25 MCG/ACT AEPB Inhale 1 puff into the lungs daily.          Past Medical History:  Diagnosis Date   Arthritis    COPD (chronic obstructive pulmonary disease) (HCC)    Depression    Dyspnea    Essential hypertension, benign    Type 2 diabetes mellitus (HCC)    Urinary  incontinence        Objective:    Wts  02/09/2024       161 01/12/2024       157  11/27/2023         144  08/29/2023         144  03/18/2023       141   02/11/2023       140  12/31/2022        136   08/21/2022        143  05/16/22 135 lb 12.8 oz (61.6 kg)  05/14/22 134 lb (60.8 kg)  04/05/22 134 lb 6.4 oz (61 kg)    Vital signs reviewed  02/09/2024  - Note at rest 02 sats  94% on 3lpm pOC    General appearance:    w/c bound chronically ill, very difficult to understand   HEENT :  Oropharynx  clear      NECK :  without JVD/Nodes/TM/ nl carotid upstrokes bilaterally   LUNGS: no acc muscle use,  Mod barrel  contour chest wall with bilateral  Distant bs s audible wheeze and  without cough on insp or exp maneuvers and mod  Hyperresonant  to  percussion bilaterally     CV:  RRR  no s3 or murmur or increase in P2, and trace pitting both LEs  ABD:  soft and nontender with pos mid insp Hoover's  in the supine position. No bruits or organomegaly appreciated, bowel sounds nl  MS:   Ext warm without deformities or  obvious joint restrictions , calf tenderness, cyanosis or clubbing  SKIN: warm and dry without lesions    NEURO:  alert, approp, nl sensorium with  no motor or cerebellar deficits apparent.        I personally reviewed images and agree with radiology impression as follows:  CXR:   portable 01/17/24  Bilateral effsuions/ atx/ cm     Assessment     Assessment & Plan COPD GOLD 3/ group E  still smoking Active smoker - PFT's  02/16/2019  FEV1 0.87 (39 % ) ratio 0.39  p 7 % improvement from saba p ? prior to study with DLCO  10.88 (49%)   and FV curve classically concave  - 04/05/2022  After extensive coaching inhaler device,  effectiveness =    75% with dpi > trelegy trial  - d/c ACE 04/05/2022  - 04/05/2022   Walked on RA  x  3  lap(s) =  approx 450  ft  @ mod pace, stopped due to end of study with lowest 02 sats 95% no sob   - PFT's  06/03/22  FEV1 0.95 (38 % ) ratio  0.54  p 0 % improvement from saba p 0 prior to study with DLCO  10.31 (49%)   and FV curve mildly concave/somewhat atyical and ERV 6% at wt 144   - 02/11/2023  After extensive coaching inhaler device,  effectiveness =    75% with dpi, 25% with hfa  - 02/11/2023 try pred 20 until better then 10 mg daily x 5 d and off>>> much better  - 02/11/2023    d/c acei  - 11/27/2023 new ABC with A= duoneb/bud 0.25 and C = prednisone   20 until better then 10 mg daily x 5 d and off - 01/12/2024  After extensive coaching inhaler device,  effectiveness =    50% with hfa and dpi so continue duoneb/bud 0.25 mg up to 4 x daily as plan D  Very little to offer this pt who doesn't bring her meds or know her meds or inhalers/ nebs  - she lives off of blister packs but these won't work for resp rx and not sure what intructions the pharmacy is using to fill them so asked her to return with the blister pack and all daily meds in one bag and all the others in a second bag  using a trust but verify approach to confirm accurate Medication  Reconciliation.  The principal here is that until we are certain that the  patients are doing what we've asked, it makes no sense to ask them to do more.  Nocturnal hypoxemia As of 08/29/2023 on 2lpm hs  - 08/29/2023   Walked on RA  x  2  lap(s) =  approx 300  ft  @ slow pace, stopped due to sob  with lowest 02 sats 97%   - 01/12/2024   Walked on RA  x  2  lap(s) =  approx 300  ft  @ slow  pace, stopped due to knee pain  with lowest 02 sats  89%   Rec 3lpm hs and titrate daytime to keep sats >90%    Multiple pulmonary nodules determined by computed tomography of lung  Cigarette smoker 5   min discussion re active cigarette smoking in addition to office E&M  Ask about tobacco use:   ongoing Advise quitting   matter of life and breath at this point  Assess willingness:  Not committed at this point Assist  in quit attempt:  Per PCP when ready Arrange follow up:   Follow up per Primary Care  planned         Total time for H and P, chart review, counseling, reviewing neb/02 / pulse ox  device(s) and generating customized AVS unique to this office visit / same day charting = 35 min          AVS  Patient Instructions  No change in medications but there is no  reason to return here until / unless we can be  sure what medications you are taking and which ones you are taking daily vs as needed  Ok to adjust your 02 to keep your oxygen  level on your finger around 90%  or better on whatever equipment your are on - remember to turn it back down if you are having to turn it up to walking   The key is to stop smoking completely before smoking completely stops you!     Please schedule a follow up office visit in 4 weeks, call sooner if needed with all medications /inhalers/ solutions in hand so we can verify exactly what you are taking. This includes all medications from all doctors and over the counters - PLEASE separate them into two bags:  the ones you take automatically, no matter what, vs the ones you take just when you feel you need them BAG #2 is UP TO YOU  - this will really help us  help you take your medications more effectively.    Ozell America, MD 02/09/2024

## 2024-02-12 ENCOUNTER — Encounter (INDEPENDENT_AMBULATORY_CARE_PROVIDER_SITE_OTHER): Payer: Self-pay | Admitting: Vascular Surgery

## 2024-03-22 ENCOUNTER — Inpatient Hospital Stay: Attending: Hematology

## 2024-03-22 DIAGNOSIS — R0602 Shortness of breath: Secondary | ICD-10-CM | POA: Diagnosis not present

## 2024-03-22 DIAGNOSIS — R062 Wheezing: Secondary | ICD-10-CM | POA: Diagnosis not present

## 2024-03-22 DIAGNOSIS — Z833 Family history of diabetes mellitus: Secondary | ICD-10-CM | POA: Diagnosis not present

## 2024-03-22 DIAGNOSIS — Z79899 Other long term (current) drug therapy: Secondary | ICD-10-CM | POA: Diagnosis not present

## 2024-03-22 DIAGNOSIS — Z8249 Family history of ischemic heart disease and other diseases of the circulatory system: Secondary | ICD-10-CM | POA: Diagnosis not present

## 2024-03-22 DIAGNOSIS — I700 Atherosclerosis of aorta: Secondary | ICD-10-CM | POA: Diagnosis not present

## 2024-03-22 DIAGNOSIS — R42 Dizziness and giddiness: Secondary | ICD-10-CM | POA: Diagnosis not present

## 2024-03-22 DIAGNOSIS — R200 Anesthesia of skin: Secondary | ICD-10-CM | POA: Diagnosis not present

## 2024-03-22 DIAGNOSIS — C3412 Malignant neoplasm of upper lobe, left bronchus or lung: Secondary | ICD-10-CM | POA: Diagnosis present

## 2024-03-22 DIAGNOSIS — F32A Depression, unspecified: Secondary | ICD-10-CM | POA: Diagnosis not present

## 2024-03-22 DIAGNOSIS — R059 Cough, unspecified: Secondary | ICD-10-CM | POA: Diagnosis not present

## 2024-03-22 DIAGNOSIS — R5383 Other fatigue: Secondary | ICD-10-CM | POA: Diagnosis not present

## 2024-03-22 LAB — COMPREHENSIVE METABOLIC PANEL WITH GFR
ALT: 18 U/L (ref 0–44)
AST: 25 U/L (ref 15–41)
Albumin: 3.7 g/dL (ref 3.5–5.0)
Alkaline Phosphatase: 115 U/L (ref 38–126)
Anion gap: 14 (ref 5–15)
BUN: 28 mg/dL — ABNORMAL HIGH (ref 8–23)
CO2: 27 mmol/L (ref 22–32)
Calcium: 9.1 mg/dL (ref 8.9–10.3)
Chloride: 98 mmol/L (ref 98–111)
Creatinine, Ser: 1.86 mg/dL — ABNORMAL HIGH (ref 0.44–1.00)
GFR, Estimated: 28 mL/min — ABNORMAL LOW
Glucose, Bld: 156 mg/dL — ABNORMAL HIGH (ref 70–99)
Potassium: 4.9 mmol/L (ref 3.5–5.1)
Sodium: 140 mmol/L (ref 135–145)
Total Bilirubin: 0.3 mg/dL (ref 0.0–1.2)
Total Protein: 6.8 g/dL (ref 6.5–8.1)

## 2024-03-22 LAB — CBC WITH DIFFERENTIAL/PLATELET
Abs Immature Granulocytes: 0.08 K/uL — ABNORMAL HIGH (ref 0.00–0.07)
Basophils Absolute: 0.1 K/uL (ref 0.0–0.1)
Basophils Relative: 0 %
Eosinophils Absolute: 0.4 K/uL (ref 0.0–0.5)
Eosinophils Relative: 3 %
HCT: 34.8 % — ABNORMAL LOW (ref 36.0–46.0)
Hemoglobin: 9.9 g/dL — ABNORMAL LOW (ref 12.0–15.0)
Immature Granulocytes: 1 %
Lymphocytes Relative: 28 %
Lymphs Abs: 3.8 K/uL (ref 0.7–4.0)
MCH: 25.2 pg — ABNORMAL LOW (ref 26.0–34.0)
MCHC: 28.4 g/dL — ABNORMAL LOW (ref 30.0–36.0)
MCV: 88.5 fL (ref 80.0–100.0)
Monocytes Absolute: 1.1 K/uL — ABNORMAL HIGH (ref 0.1–1.0)
Monocytes Relative: 8 %
Neutro Abs: 8 K/uL — ABNORMAL HIGH (ref 1.7–7.7)
Neutrophils Relative %: 60 %
Platelets: 346 K/uL (ref 150–400)
RBC: 3.93 MIL/uL (ref 3.87–5.11)
RDW: 17.7 % — ABNORMAL HIGH (ref 11.5–15.5)
WBC: 13.5 K/uL — ABNORMAL HIGH (ref 4.0–10.5)
nRBC: 0.1 % (ref 0.0–0.2)

## 2024-03-26 ENCOUNTER — Inpatient Hospital Stay

## 2024-03-26 ENCOUNTER — Ambulatory Visit (HOSPITAL_COMMUNITY): Admission: RE | Admit: 2024-03-26 | Source: Ambulatory Visit

## 2024-03-29 ENCOUNTER — Encounter (HOSPITAL_COMMUNITY): Payer: Self-pay

## 2024-03-29 ENCOUNTER — Observation Stay (HOSPITAL_COMMUNITY)

## 2024-03-29 ENCOUNTER — Emergency Department (HOSPITAL_COMMUNITY)

## 2024-03-29 ENCOUNTER — Inpatient Hospital Stay (HOSPITAL_COMMUNITY)
Admission: EM | Admit: 2024-03-29 | Discharge: 2024-04-01 | DRG: 291 | Disposition: A | Discharge status: Home Health | Attending: Internal Medicine | Admitting: Internal Medicine

## 2024-03-29 ENCOUNTER — Other Ambulatory Visit: Payer: Self-pay

## 2024-03-29 ENCOUNTER — Encounter: Payer: Self-pay | Admitting: *Deleted

## 2024-03-29 DIAGNOSIS — I480 Paroxysmal atrial fibrillation: Secondary | ICD-10-CM | POA: Diagnosis not present

## 2024-03-29 DIAGNOSIS — Z79890 Hormone replacement therapy (postmenopausal): Secondary | ICD-10-CM

## 2024-03-29 DIAGNOSIS — I130 Hypertensive heart and chronic kidney disease with heart failure and stage 1 through stage 4 chronic kidney disease, or unspecified chronic kidney disease: Principal | ICD-10-CM | POA: Diagnosis present

## 2024-03-29 DIAGNOSIS — C3412 Malignant neoplasm of upper lobe, left bronchus or lung: Secondary | ICD-10-CM | POA: Diagnosis present

## 2024-03-29 DIAGNOSIS — Z79899 Other long term (current) drug therapy: Secondary | ICD-10-CM

## 2024-03-29 DIAGNOSIS — J441 Chronic obstructive pulmonary disease with (acute) exacerbation: Principal | ICD-10-CM | POA: Diagnosis present

## 2024-03-29 DIAGNOSIS — R0902 Hypoxemia: Secondary | ICD-10-CM

## 2024-03-29 DIAGNOSIS — J9621 Acute and chronic respiratory failure with hypoxia: Secondary | ICD-10-CM | POA: Diagnosis present

## 2024-03-29 DIAGNOSIS — E119 Type 2 diabetes mellitus without complications: Secondary | ICD-10-CM

## 2024-03-29 DIAGNOSIS — E1165 Type 2 diabetes mellitus with hyperglycemia: Secondary | ICD-10-CM | POA: Diagnosis present

## 2024-03-29 DIAGNOSIS — I5031 Acute diastolic (congestive) heart failure: Secondary | ICD-10-CM

## 2024-03-29 DIAGNOSIS — Z7951 Long term (current) use of inhaled steroids: Secondary | ICD-10-CM

## 2024-03-29 DIAGNOSIS — Z7901 Long term (current) use of anticoagulants: Secondary | ICD-10-CM

## 2024-03-29 DIAGNOSIS — I5033 Acute on chronic diastolic (congestive) heart failure: Secondary | ICD-10-CM | POA: Diagnosis not present

## 2024-03-29 DIAGNOSIS — R296 Repeated falls: Secondary | ICD-10-CM | POA: Diagnosis present

## 2024-03-29 DIAGNOSIS — J449 Chronic obstructive pulmonary disease, unspecified: Secondary | ICD-10-CM | POA: Diagnosis not present

## 2024-03-29 DIAGNOSIS — C3492 Malignant neoplasm of unspecified part of left bronchus or lung: Secondary | ICD-10-CM | POA: Diagnosis not present

## 2024-03-29 DIAGNOSIS — Z716 Tobacco abuse counseling: Secondary | ICD-10-CM

## 2024-03-29 DIAGNOSIS — Z1152 Encounter for screening for COVID-19: Secondary | ICD-10-CM

## 2024-03-29 DIAGNOSIS — I10 Essential (primary) hypertension: Secondary | ICD-10-CM | POA: Diagnosis not present

## 2024-03-29 DIAGNOSIS — Z833 Family history of diabetes mellitus: Secondary | ICD-10-CM

## 2024-03-29 DIAGNOSIS — F17210 Nicotine dependence, cigarettes, uncomplicated: Secondary | ICD-10-CM | POA: Diagnosis present

## 2024-03-29 DIAGNOSIS — Z923 Personal history of irradiation: Secondary | ICD-10-CM

## 2024-03-29 DIAGNOSIS — E1169 Type 2 diabetes mellitus with other specified complication: Secondary | ICD-10-CM

## 2024-03-29 DIAGNOSIS — Z7984 Long term (current) use of oral hypoglycemic drugs: Secondary | ICD-10-CM

## 2024-03-29 DIAGNOSIS — N1831 Chronic kidney disease, stage 3a: Secondary | ICD-10-CM | POA: Diagnosis present

## 2024-03-29 DIAGNOSIS — E873 Alkalosis: Secondary | ICD-10-CM | POA: Diagnosis not present

## 2024-03-29 DIAGNOSIS — Z8249 Family history of ischemic heart disease and other diseases of the circulatory system: Secondary | ICD-10-CM

## 2024-03-29 DIAGNOSIS — J9620 Acute and chronic respiratory failure, unspecified whether with hypoxia or hypercapnia: Secondary | ICD-10-CM | POA: Diagnosis present

## 2024-03-29 HISTORY — DX: Unspecified atrial fibrillation: I48.91

## 2024-03-29 LAB — URINALYSIS, ROUTINE W REFLEX MICROSCOPIC
Bilirubin Urine: NEGATIVE
Glucose, UA: NEGATIVE mg/dL
Hgb urine dipstick: NEGATIVE
Ketones, ur: NEGATIVE mg/dL
Leukocytes,Ua: NEGATIVE
Nitrite: NEGATIVE
Protein, ur: NEGATIVE mg/dL
Specific Gravity, Urine: 1.006 (ref 1.005–1.030)
pH: 7 (ref 5.0–8.0)

## 2024-03-29 LAB — RESP PANEL BY RT-PCR (RSV, FLU A&B, COVID)  RVPGX2
Influenza A by PCR: NEGATIVE
Influenza B by PCR: NEGATIVE
Resp Syncytial Virus by PCR: NEGATIVE
SARS Coronavirus 2 by RT PCR: NEGATIVE

## 2024-03-29 LAB — CBC WITH DIFFERENTIAL/PLATELET
Abs Immature Granulocytes: 0.07 K/uL (ref 0.00–0.07)
Basophils Absolute: 0.1 K/uL (ref 0.0–0.1)
Basophils Relative: 1 %
Eosinophils Absolute: 0.5 K/uL (ref 0.0–0.5)
Eosinophils Relative: 4 %
HCT: 34.8 % — ABNORMAL LOW (ref 36.0–46.0)
Hemoglobin: 10 g/dL — ABNORMAL LOW (ref 12.0–15.0)
Immature Granulocytes: 1 %
Lymphocytes Relative: 26 %
Lymphs Abs: 3.3 K/uL (ref 0.7–4.0)
MCH: 25.6 pg — ABNORMAL LOW (ref 26.0–34.0)
MCHC: 28.7 g/dL — ABNORMAL LOW (ref 30.0–36.0)
MCV: 89 fL (ref 80.0–100.0)
Monocytes Absolute: 1.3 K/uL — ABNORMAL HIGH (ref 0.1–1.0)
Monocytes Relative: 10 %
Neutro Abs: 7.5 K/uL (ref 1.7–7.7)
Neutrophils Relative %: 58 %
Platelets: 300 K/uL (ref 150–400)
RBC: 3.91 MIL/uL (ref 3.87–5.11)
RDW: 18.3 % — ABNORMAL HIGH (ref 11.5–15.5)
WBC: 12.8 K/uL — ABNORMAL HIGH (ref 4.0–10.5)
nRBC: 0 % (ref 0.0–0.2)

## 2024-03-29 LAB — CBG MONITORING, ED: Glucose-Capillary: 111 mg/dL — ABNORMAL HIGH (ref 70–99)

## 2024-03-29 LAB — COMPREHENSIVE METABOLIC PANEL WITH GFR
ALT: <5 U/L (ref 0–44)
AST: 15 U/L (ref 15–41)
Albumin: 3.7 g/dL (ref 3.5–5.0)
Alkaline Phosphatase: 102 U/L (ref 38–126)
Anion gap: 16 — ABNORMAL HIGH (ref 5–15)
BUN: 16 mg/dL (ref 8–23)
CO2: 28 mmol/L (ref 22–32)
Calcium: 9.3 mg/dL (ref 8.9–10.3)
Chloride: 95 mmol/L — ABNORMAL LOW (ref 98–111)
Creatinine, Ser: 1.13 mg/dL — ABNORMAL HIGH (ref 0.44–1.00)
GFR, Estimated: 52 mL/min — ABNORMAL LOW
Glucose, Bld: 118 mg/dL — ABNORMAL HIGH (ref 70–99)
Potassium: 3.9 mmol/L (ref 3.5–5.1)
Sodium: 139 mmol/L (ref 135–145)
Total Bilirubin: 0.4 mg/dL (ref 0.0–1.2)
Total Protein: 6.7 g/dL (ref 6.5–8.1)

## 2024-03-29 LAB — PRO BRAIN NATRIURETIC PEPTIDE: Pro Brain Natriuretic Peptide: 3579 pg/mL — ABNORMAL HIGH

## 2024-03-29 LAB — TROPONIN T, HIGH SENSITIVITY
Troponin T High Sensitivity: 22 ng/L — ABNORMAL HIGH (ref 0–19)
Troponin T High Sensitivity: 25 ng/L — ABNORMAL HIGH (ref 0–19)

## 2024-03-29 MED ORDER — LEVOTHYROXINE SODIUM 25 MCG PO TABS
25.0000 ug | ORAL_TABLET | Freq: Every day | ORAL | Status: DC
  Administered 2024-03-30 – 2024-04-01 (×3): 25 ug via ORAL
  Filled 2024-03-29 (×3): qty 1

## 2024-03-29 MED ORDER — ACETAMINOPHEN 650 MG RE SUPP: 650.0000 mg | Freq: Four times a day (QID) | RECTAL | Status: DC | PRN

## 2024-03-29 MED ORDER — APIXABAN 5 MG PO TABS
5.0000 mg | ORAL_TABLET | Freq: Two times a day (BID) | ORAL | Status: DC
  Administered 2024-03-29 – 2024-04-01 (×6): 5 mg via ORAL
  Filled 2024-03-29 (×6): qty 1

## 2024-03-29 MED ORDER — IPRATROPIUM-ALBUTEROL 0.5-2.5 (3) MG/3ML IN SOLN
3.0000 mL | Freq: Once | RESPIRATORY_TRACT | Status: AC
  Administered 2024-03-29: 3 mL via RESPIRATORY_TRACT
  Filled 2024-03-29: qty 3

## 2024-03-29 MED ORDER — INSULIN ASPART 100 UNIT/ML IJ SOLN
0.0000 [IU] | Freq: Three times a day (TID) | INTRAMUSCULAR | Status: DC
  Administered 2024-03-30 (×3): 5 [IU] via SUBCUTANEOUS
  Administered 2024-03-31: 1 [IU] via SUBCUTANEOUS
  Administered 2024-03-31: 2 [IU] via SUBCUTANEOUS
  Administered 2024-04-01: 3 [IU] via SUBCUTANEOUS
  Administered 2024-04-01: 1 [IU] via SUBCUTANEOUS
  Filled 2024-03-29 (×7): qty 1

## 2024-03-29 MED ORDER — FUROSEMIDE 10 MG/ML IJ SOLN
40.0000 mg | Freq: Once | INTRAMUSCULAR | Status: AC
  Administered 2024-03-29: 40 mg via INTRAVENOUS
  Filled 2024-03-29: qty 4

## 2024-03-29 MED ORDER — ACETAMINOPHEN 325 MG PO TABS: 650.0000 mg | ORAL_TABLET | Freq: Four times a day (QID) | ORAL | Status: DC | PRN

## 2024-03-29 MED ORDER — IPRATROPIUM-ALBUTEROL 0.5-2.5 (3) MG/3ML IN SOLN: 3.0000 mL | Freq: Three times a day (TID) | RESPIRATORY_TRACT | Status: DC

## 2024-03-29 MED ORDER — POLYETHYLENE GLYCOL 3350 17 G PO PACK: 17.0000 g | PACK | Freq: Every day | ORAL | Status: DC | PRN

## 2024-03-29 MED ORDER — PROMETHAZINE HCL 12.5 MG PO TABS: 12.5000 mg | ORAL_TABLET | Freq: Four times a day (QID) | ORAL | Status: DC | PRN

## 2024-03-29 MED ORDER — GUAIFENESIN-DM 100-10 MG/5ML PO SYRP
15.0000 mL | ORAL_SOLUTION | Freq: Three times a day (TID) | ORAL | Status: DC | PRN
  Administered 2024-04-01: 15 mL via ORAL
  Filled 2024-03-29: qty 15

## 2024-03-29 MED ORDER — DILTIAZEM HCL ER COATED BEADS 240 MG PO CP24
240.0000 mg | ORAL_CAPSULE | Freq: Every day | ORAL | Status: DC
  Administered 2024-03-30 – 2024-04-01 (×3): 240 mg via ORAL
  Filled 2024-03-29 (×3): qty 1

## 2024-03-29 MED ORDER — INSULIN ASPART 100 UNIT/ML IJ SOLN: 0.0000 [IU] | Freq: Every day | INTRAMUSCULAR | Status: DC

## 2024-03-29 MED ORDER — IRBESARTAN 150 MG PO TABS
300.0000 mg | ORAL_TABLET | Freq: Every day | ORAL | Status: DC
  Administered 2024-03-30 – 2024-04-01 (×3): 300 mg via ORAL
  Filled 2024-03-29 (×3): qty 2

## 2024-03-29 MED ORDER — BUDESON-GLYCOPYRROL-FORMOTEROL 160-9-4.8 MCG/ACT IN AERO
2.0000 | INHALATION_SPRAY | Freq: Two times a day (BID) | RESPIRATORY_TRACT | Status: DC
  Administered 2024-03-30 – 2024-03-31 (×3): 2 via RESPIRATORY_TRACT
  Filled 2024-03-29: qty 5.9

## 2024-03-29 MED ORDER — PANTOPRAZOLE SODIUM 40 MG PO TBEC
40.0000 mg | DELAYED_RELEASE_TABLET | Freq: Two times a day (BID) | ORAL | Status: DC
  Administered 2024-03-29 – 2024-04-01 (×6): 40 mg via ORAL
  Filled 2024-03-29 (×6): qty 1

## 2024-03-29 MED ORDER — BUDESONIDE 0.25 MG/2ML IN SUSP
0.2500 mg | Freq: Four times a day (QID) | RESPIRATORY_TRACT | Status: DC | PRN
  Administered 2024-03-30 – 2024-04-01 (×2): 0.25 mg via RESPIRATORY_TRACT
  Filled 2024-03-29 (×2): qty 2

## 2024-03-29 MED ORDER — BISOPROLOL FUMARATE 5 MG PO TABS
10.0000 mg | ORAL_TABLET | Freq: Every day | ORAL | Status: DC
  Administered 2024-03-30 – 2024-04-01 (×3): 10 mg via ORAL
  Filled 2024-03-29 (×3): qty 2

## 2024-03-29 MED ORDER — FUROSEMIDE 10 MG/ML IJ SOLN
40.0000 mg | Freq: Two times a day (BID) | INTRAMUSCULAR | Status: DC
  Administered 2024-03-30: 40 mg via INTRAVENOUS
  Filled 2024-03-29: qty 4

## 2024-03-29 MED ORDER — METHYLPREDNISOLONE SODIUM SUCC 125 MG IJ SOLR
80.0000 mg | Freq: Once | INTRAMUSCULAR | Status: AC
  Administered 2024-03-29: 80 mg via INTRAVENOUS
  Filled 2024-03-29: qty 2

## 2024-03-29 NOTE — ED Provider Notes (Signed)
 " [place] EMERGENCY DEPARTMENT AT [facility] Provider Note   CSN: [id number] Arrival date & time: 03/29/24  1933     Patient presents with: Shortness of Breath   Holly May is a 73 y.o. female.   Patient is a 73 year old female who presents to the emergency department with a chief complaint of shortness of breath, swelling to bilateral lower extremities which has been worsening over the past few days.  Patient notes that she has been compliant with all medications including her fluid pills with no improvement in her symptoms but she continues to have worsening swelling and weight gain.  Patient denies any associated fever, chills, chest pain, abdominal pain, nausea, vomiting, diarrhea.  She does have a history of CHF, COPD, diabetes.   Shortness of Breath      Prior to Admission medications  Medication Sig Start Date End Date Taking? Authorizing Provider  albuterol  (PROVENTIL  HFA;VENTOLIN  HFA) 108 (90 Base) MCG/ACT inhaler Inhale 2 puffs into the lungs every 4 (four) hours as needed for wheezing or shortness of breath (cough, shortness of breath or wheezing.). 02/18/18   Maree, Pratik D, DO  apixaban  (ELIQUIS ) 5 MG TABS tablet Take 1 tablet (5 mg total) by mouth 2 (two) times daily. 05/01/23   Mallipeddi, Vishnu P, MD  bisoprolol  (ZEBETA ) 10 MG tablet Take 1 tablet (10 mg total) by mouth daily. 01/21/24 02/20/24  Regalado, Owen A, MD  Blood Glucose Monitoring Suppl DEVI 1 each by Does not apply route as directed. Dispense based on patient and insurance preference. Use up to four times daily as directed. (FOR ICD-10 E10.9, E11.9). 01/20/24   Regalado, Belkys A, MD  budesonide  (PULMICORT ) 0.25 MG/2ML nebulizer solution One vial with each duoneb up to 4 x daily Patient taking differently: Take 0.25 mg by nebulization 4 (four) times daily as needed (shortness of breath, wheezing). One vial with each duoneb up to 4 x daily 11/27/23   Wert, Michael B, MD  citalopram  (CELEXA ) 40  MG tablet Take 40 mg by mouth daily.    [provider]  diltiazem  (CARDIZEM  CD) 240 MG 24 hr capsule Take 1 capsule (240 mg total) by mouth daily. 01/21/24 02/20/24  Regalado, Owen A, MD  furosemide  (LASIX ) 20 MG tablet Take 1 tablet (20 mg total) by mouth 2 (two) times daily. 01/20/24   Regalado, Belkys A, MD  gabapentin  (NEURONTIN ) 300 MG capsule Take 300 mg by mouth at bedtime.    [provider]  glipiZIDE  (GLUCOTROL ) 5 MG tablet Take 1 tablet (5 mg total) by mouth 2 (two) times daily before a meal. 01/20/24 02/19/24  Regalado, Belkys A, MD  Glucose Blood (BLOOD GLUCOSE TEST STRIPS) STRP 1 each by Does not apply route as directed. Dispense based on patient and insurance preference. Use up to four times daily as directed. (FOR ICD-10 E10.9, E11.9). 01/20/24   Regalado, Belkys A, MD  HYDROcodone -acetaminophen  (NORCO/VICODIN) 5-325 MG tablet Take 1-2 tablets by mouth every 4 (four) hours as needed for moderate pain (pain score 4-6). 01/20/24   Regalado, Belkys A, MD  ipratropium-albuterol  (DUONEB) 0.5-2.5 (3) MG/3ML SOLN Take 3 mLs by nebulization every 6 (six) hours as needed (wheezing, shortness of breath). 01/20/24   Regalado, Owen LABOR, MD  Lancet Device MISC 1 each by Does not apply route as directed. Dispense based on patient and insurance preference. Use up to four times daily as directed. (FOR ICD-10 E10.9, E11.9). 01/20/24   Regalado, Owen LABOR, MD  Lancets MISC 1 each  by Does not apply route as directed. Dispense based on patient and insurance preference. Use up to four times daily as directed. (FOR ICD-10 E10.9, E11.9). 01/20/24   Regalado, Owen A, MD  levothyroxine  (SYNTHROID ) 25 MCG tablet Take 1 tablet (25 mcg total) by mouth daily before breakfast. 01/21/24 02/20/24  Regalado, Belkys A, MD  lovastatin (MEVACOR) 40 MG tablet Take 40 mg by mouth at bedtime.    [provider]  mirtazapine  (REMERON ) 15 MG tablet Take 15 mg by mouth at bedtime. For appetite     [provider]  naltrexone (DEPADE) 50 MG tablet Take 25 mg by mouth daily. 11/07/23   [provider]  nicotine  (NICODERM CQ  - DOSED IN MG/24 HOURS) 21 mg/24hr patch Place 1 patch (21 mg total) onto the skin daily as needed (craving smoking). 01/20/24   Regalado, Belkys A, MD  olmesartan  (BENICAR ) 40 MG tablet Take 1 tablet (40 mg total) by mouth daily. 02/11/23 02/11/24  Darlean Ozell NOVAK, MD  oxybutynin  (DITROPAN ) 5 MG tablet Take 5 mg by mouth 3 (three) times daily.    [provider]  pantoprazole  (PROTONIX ) 40 MG tablet Take 1 tablet (40 mg total) by mouth 2 (two) times daily. 01/20/24 02/19/24  Regalado, Owen A, MD  potassium chloride  SA (KLOR-CON  M) 20 MEQ tablet TAKE ONE TABLET BY MOUTH ONCE DAILY 11/06/23   Rogers Hai, MD  TRELEGY ELLIPTA 100-62.5-25 MCG/ACT AEPB Inhale 1 puff into the lungs daily. 07/17/23   [provider]    Allergies: Patient has no known allergies.    Review of Systems  Respiratory:  Positive for shortness of breath.   All other systems reviewed and are negative.   Updated Vital Signs BP (!) 181/102 (BP Location: Left Arm)   Pulse 73   Temp 97.8 F (36.6 C) (Oral)   Resp (!) 24   Wt 73 kg   SpO2 98%   BMI 25.22 kg/m   Physical Exam Vitals and nursing note reviewed.  Constitutional:      General: She is not in acute distress.    Appearance: Normal appearance. She is not ill-appearing.  HENT:     Head: Normocephalic and atraumatic.     Nose: Nose normal.     Mouth/Throat:     Mouth: Mucous membranes are moist.  Eyes:     Extraocular Movements: Extraocular movements intact.     Conjunctiva/sclera: Conjunctivae normal.     Pupils: Pupils are equal, round, and reactive to light.  Cardiovascular:     Rate and Rhythm: Normal rate and regular rhythm.     Pulses: Normal pulses.     Heart sounds: Normal heart sounds. No murmur heard.    No gallop.  Pulmonary:     Effort: Pulmonary effort is normal.  Tachypnea present.     Breath sounds: No stridor. Wheezing and rales present. No decreased breath sounds or rhonchi.  Chest:     Chest wall: No tenderness.  Abdominal:     General: Abdomen is flat. Bowel sounds are normal.     Palpations: Abdomen is soft.     Tenderness: There is no abdominal tenderness. There is no guarding.  Musculoskeletal:        General: Normal range of motion.     Cervical back: Normal range of motion and neck supple.     Right lower leg: Edema present.     Left lower leg: Edema present.  Skin:    General: Skin is warm and dry.  Findings: No ecchymosis or rash.  Neurological:     General: No focal deficit present.     Mental Status: She is alert and oriented to person, place, and time. Mental status is at baseline.     Cranial Nerves: No cranial nerve deficit.     Motor: No weakness.  Psychiatric:        Mood and Affect: Mood normal.        Behavior: Behavior normal.        Thought Content: Thought content normal.        Judgment: Judgment normal.     (all labs ordered are listed, but only abnormal results are displayed) Labs Reviewed  RESP PANEL BY RT-PCR (RSV, FLU A&B, COVID)  [id number]  COMPREHENSIVE METABOLIC PANEL WITH GFR  PRO BRAIN NATRIURETIC PEPTIDE  CBC WITH DIFFERENTIAL/PLATELET  URINALYSIS, ROUTINE W REFLEX MICROSCOPIC  TROPONIN T, HIGH SENSITIVITY    EKG: None  Radiology: No results found.   Procedures   Medications Ordered in the ED  ipratropium-albuterol  (DUONEB) 0.5-2.5 (3) MG/3ML nebulizer solution 3 mL (has no administration in time range)  ipratropium-albuterol  (DUONEB) 0.5-2.5 (3) MG/3ML nebulizer solution 3 mL (has no administration in time range)  methylPREDNISolone  sodium succinate (SOLU-MEDROL ) 125 mg/2 mL injection 80 mg (has no administration in time range)  furosemide  (LASIX ) injection 40 mg (has no administration in time range)                                    Medical Decision Making Amount and/or  Complexity of Data Reviewed Labs: ordered. Radiology: ordered.  Risk Prescription drug management. Decision regarding hospitalization.   This patient presents to the ED for concern of dyspnea, this involves an extensive number of treatment options, and is a complaint that carries with it a high risk of complications and morbidity.  The differential diagnosis includes acute CHF, COPD, pulmonary embolus, ACS, pneumonia, pneumothorax, hemothorax, acute viral syndrome   Co morbidities that complicate the patient evaluation  COPD, CHF, diabetes   Additional history obtained:  Additional history obtained from medical record External records from outside source obtained and reviewed including medical records   Lab Tests:  I Ordered, and personally interpreted labs.  The pertinent results include: Mild leukocytosis, anemia at baseline, creatinine at baseline, normal liver function, unremarkable electrolytes, negative respiratory panel, elevated troponin, elevated BNP   Imaging Studies ordered:  I ordered imaging studies including chest x-ray I independently visualized and interpreted imaging which showed mild vascular crowding with no overt pulmonary edema I agree with the radiologist interpretation   Cardiac Monitoring: / EKG:  The patient was maintained on a cardiac monitor.  I personally viewed and interpreted the cardiac monitored which showed an underlying rhythm of: Atrial fibrillation, no acute ischemic changes, no ischemic changes, no STEMI   Consultations Obtained:  I requested consultation with the hospitalist,  and discussed lab and imaging findings as well as pertinent plan - they recommend: Admission   Problem List / ED Course / Critical interventions / Medication management  Patient is doing well at this time and does remain stable.  He is still requiring 5 L nasal cannula and does desat with minimal exertion.  Suspect that COPD and CHF at this time.-Given  breathing treatment for 12 days with Lasix  in the emergency department this time includes.  She did have a negative respiratory panel.  X-ray demonstrates no obvious acute consolidation.  Have  discussed patient case with Dr. FORBES Carwin with the hospitalist service who has excepted for admission. I ordered medication including DuoNeb, solu- Medrol , Lasix  for CHF, COPD Reevaluation of the patient after these medicines showed that the patient improved I have reviewed the patients home medicines and have made adjustments as needed   Social Determinants of Health:  None   Test / Admission - Considered:  Admission     Final diagnoses:  None    ED Discharge Orders     None          Daralene Lonni JONETTA DEVONNA 03/29/24 2146  "

## 2024-03-29 NOTE — ED Notes (Signed)
 Patient transported to CT

## 2024-03-29 NOTE — ED Notes (Signed)
 ED Provider at bedside.

## 2024-03-29 NOTE — Progress Notes (Signed)
 Patient placed on female external catheter. Medications given. Call light provided.

## 2024-03-29 NOTE — H&P (Signed)
 " History and Physical    Holly May FMW:[id number] DOB: 1951-01-26 DOA: 03/29/2024  PCP: Katrinka Aquas, MD   Patient coming from: Home  I have personally briefly reviewed patient's old medical records in [facility]  Chief Complaint: Leg swelling, SOB  HPI: Holly May is a 74 y.o. female with medical history significant for chronic respiratory failure on 5 L, atrial fibrillation, COPD, diabetes mellitus, hypertension. Patient presented to the ED with complaints of difficulty breathing started today, worse with exertion, and progressive bilateral lower extremity swelling.  Patient is not the best historian, she also does not have her dentures in, so her speech is a bit hard to understand but at baseline.  Reports compliance with Lasix  40 mg twice daily.  She does not check her weights regularly and is unsure of her baseline weight. No chest pain.  She reports intermittent cough.  Reports she is on 5 L of oxygen  because her O2 cord is long, otherwise she should be on 2 L.  ED Course: Heart rate 75-89, respiratory rate 15-24.  Blood pressure systolic 136-181.  O2 sats 92 to 100% on 5 L,.  Per EDP, with activity, sats desats to 80s on 5 L. ProBNP elevated at 3579.  WBC 12.8.  COVID influenza RSV negative.  Troponin 22. Chest x-ray pending. IV Lasix  40 mg x 1 given. IV Solu-Medrol  125 mg given and DuoNebs x 2. Hospitalist to admit for likely CHF exacerbation and COPD.  Review of Systems: As per HPI all other systems reviewed and negative.  Past Medical History:  Diagnosis Date   A-fib [facility])    Arthritis    Cancer (HCC)    lung   COPD (chronic obstructive pulmonary disease) (HCC)    Depression    Dyspnea    Essential hypertension, benign    Type 2 diabetes mellitus (HCC)    Urinary incontinence     Past Surgical History:  Procedure Laterality Date   BRONCHIAL BIOPSY  07/09/2022   Procedure: BRONCHIAL BIOPSIES;  Surgeon: Brenna Adine CROME, DO;  Location: [facility];   Service: Pulmonary;;   BRONCHIAL BRUSHINGS  07/09/2022   Procedure: BRONCHIAL BRUSHINGS;  Surgeon: Brenna Adine CROME, DO;  Location: [facility];  Service: Pulmonary;;   BRONCHIAL NEEDLE ASPIRATION BIOPSY  07/09/2022   Procedure: BRONCHIAL NEEDLE ASPIRATION BIOPSIES;  Surgeon: Brenna Adine CROME, DO;  Location: [facility];  Service: Pulmonary;;   CHOLECYSTECTOMY     FIDUCIAL MARKER PLACEMENT  07/09/2022   Procedure: FIDUCIAL MARKER PLACEMENT;  Surgeon: Brenna Adine CROME, DO;  Location: [facility];  Service: Pulmonary;;   FRACTURE SURGERY Left    arm   OPEN REDUCTION INTERNAL FIXATION (ORIF) PROXIMAL PHALANX Left 04/11/2017   Procedure: OPEN REDUCTION INTERNAL FIXATION (ORIF) PROXIMAL PHALANX;  Surgeon: Sissy Cough, MD;  Location: [facility];  Service: Orthopedics;  Laterality: Left;   RESECTION OF MEDIASTINAL MASS N/A 02/18/2019   Procedure: RESECTION OF MEDIASTINAL MASS;  Surgeon: Fleeta Hanford Coy, MD;  Location: [facility] OR;  Service: Thoracic;  Laterality: N/A;   STERNOTOMY N/A 02/18/2019   Procedure: STERNOTOMY;  Surgeon: Fleeta Hanford Coy, MD;  Location: [facility] OR;  Service: Thoracic;  Laterality: N/A;   VIDEO BRONCHOSCOPY WITH ENDOBRONCHIAL ULTRASOUND Bilateral 07/09/2022   Procedure: VIDEO BRONCHOSCOPY WITH ENDOBRONCHIAL ULTRASOUND;  Surgeon: Brenna Adine CROME, DO;  Location: [facility];  Service: Pulmonary;  Laterality: Bilateral;     reports that she has been smoking cigarettes. She started smoking about 53 years ago. She has  a 79.8 pack-year smoking history. She has never used smokeless tobacco. She reports current alcohol use. She reports that she does not use drugs.  Allergies[1]  Family History  Problem Relation Age of Onset   Diabetes Mellitus II Mother    Diabetes Mellitus II Sister    Hypertension Sister    Hypertension Brother      Prior to Admission medications  Medication Sig Start Date End Date Taking? Authorizing Provider  albuterol  (PROVENTIL   HFA;VENTOLIN  HFA) 108 (90 Base) MCG/ACT inhaler Inhale 2 puffs into the lungs every 4 (four) hours as needed for wheezing or shortness of breath (cough, shortness of breath or wheezing.). 02/18/18   Maree, Pratik D, DO  apixaban  (ELIQUIS ) 5 MG TABS tablet Take 1 tablet (5 mg total) by mouth 2 (two) times daily. 05/01/23   Mallipeddi, Vishnu P, MD  bisoprolol  (ZEBETA ) 10 MG tablet Take 1 tablet (10 mg total) by mouth daily. 01/21/24 02/20/24  Regalado, Owen A, MD  Blood Glucose Monitoring Suppl DEVI 1 each by Does not apply route as directed. Dispense based on patient and insurance preference. Use up to four times daily as directed. (FOR ICD-10 E10.9, E11.9). 01/20/24   Regalado, Belkys A, MD  budesonide  (PULMICORT ) 0.25 MG/2ML nebulizer solution One vial with each duoneb up to 4 x daily Patient taking differently: Take 0.25 mg by nebulization 4 (four) times daily as needed (shortness of breath, wheezing). One vial with each duoneb up to 4 x daily 11/27/23   Wert, Michael B, MD  citalopram  (CELEXA ) 40 MG tablet Take 40 mg by mouth daily.    [provider]  diltiazem  (CARDIZEM  CD) 240 MG 24 hr capsule Take 1 capsule (240 mg total) by mouth daily. 01/21/24 02/20/24  Regalado, Owen A, MD  furosemide  (LASIX ) 20 MG tablet Take 1 tablet (20 mg total) by mouth 2 (two) times daily. 01/20/24   Regalado, Belkys A, MD  gabapentin  (NEURONTIN ) 300 MG capsule Take 300 mg by mouth at bedtime.    [provider]  glipiZIDE  (GLUCOTROL ) 5 MG tablet Take 1 tablet (5 mg total) by mouth 2 (two) times daily before a meal. 01/20/24 02/19/24  Regalado, Belkys A, MD  Glucose Blood (BLOOD GLUCOSE TEST STRIPS) STRP 1 each by Does not apply route as directed. Dispense based on patient and insurance preference. Use up to four times daily as directed. (FOR ICD-10 E10.9, E11.9). 01/20/24   Regalado, Belkys A, MD  HYDROcodone -acetaminophen  (NORCO/VICODIN) 5-325 MG tablet Take 1-2 tablets by mouth every 4 (four) hours  as needed for moderate pain (pain score 4-6). 01/20/24   Regalado, Belkys A, MD  ipratropium-albuterol  (DUONEB) 0.5-2.5 (3) MG/3ML SOLN Take 3 mLs by nebulization every 6 (six) hours as needed (wheezing, shortness of breath). 01/20/24   Regalado, Owen LABOR, MD  Lancet Device MISC 1 each by Does not apply route as directed. Dispense based on patient and insurance preference. Use up to four times daily as directed. (FOR ICD-10 E10.9, E11.9). 01/20/24   Regalado, Owen A, MD  Lancets MISC 1 each by Does not apply route as directed. Dispense based on patient and insurance preference. Use up to four times daily as directed. (FOR ICD-10 E10.9, E11.9). 01/20/24   Regalado, Owen A, MD  levothyroxine  (SYNTHROID ) 25 MCG tablet Take 1 tablet (25 mcg total) by mouth daily before breakfast. 01/21/24 02/20/24  Regalado, Belkys A, MD  lovastatin (MEVACOR) 40 MG tablet Take 40 mg by mouth at bedtime.    [provider]  mirtazapine  (  REMERON ) 15 MG tablet Take 15 mg by mouth at bedtime. For appetite    [provider]  naltrexone (DEPADE) 50 MG tablet Take 25 mg by mouth daily. 11/07/23   [provider]  nicotine  (NICODERM CQ  - DOSED IN MG/24 HOURS) 21 mg/24hr patch Place 1 patch (21 mg total) onto the skin daily as needed (craving smoking). 01/20/24   Regalado, Belkys A, MD  olmesartan  (BENICAR ) 40 MG tablet Take 1 tablet (40 mg total) by mouth daily. 02/11/23 02/11/24  Darlean Ozell NOVAK, MD  oxybutynin  (DITROPAN ) 5 MG tablet Take 5 mg by mouth 3 (three) times daily.    [provider]  pantoprazole  (PROTONIX ) 40 MG tablet Take 1 tablet (40 mg total) by mouth 2 (two) times daily. 01/20/24 02/19/24  Regalado, Belkys A, MD  potassium chloride  SA (KLOR-CON  M) 20 MEQ tablet TAKE ONE TABLET BY MOUTH ONCE DAILY 11/06/23   Rogers Hai, MD  TRELEGY ELLIPTA 100-62.5-25 MCG/ACT AEPB Inhale 1 puff into the lungs daily. 07/17/23   [provider]    Physical Exam: Vitals:    03/29/24 2045 03/29/24 2100 03/29/24 2115 03/29/24 2130  BP: (!) 150/78 (!) 136/116 (!) 151/87 (!) 142/79  Pulse: 75 79 78 89  Resp: 18 17 17 18   Temp:      TempSrc:      SpO2: 100% 100% 96% 100%  Weight:        Constitutional: NAD, calm, comfortable Vitals:   03/29/24 2045 03/29/24 2100 03/29/24 2115 03/29/24 2130  BP: (!) 150/78 (!) 136/116 (!) 151/87 (!) 142/79  Pulse: 75 79 78 89  Resp: 18 17 17 18   Temp:      TempSrc:      SpO2: 100% 100% 96% 100%  Weight:       Eyes: PERRL, lids and conjunctivae normal ENMT: Mucous membranes are moist. .  Neck: normal, supple, no masses, no thyromegaly Respiratory: Reduced air entry bilaterally, no appreciable wheezing, sparse rhonchi. Normal respiratory effort. No accessory muscle use.  Cardiovascular: Regular rate and rhythm, no murmurs / rubs / gallops.  At least 2+ pitting bilateral extremity edema to knees.  Extremities warm.  Abdomen: no tenderness, no masses palpated. No hepatosplenomegaly.   Musculoskeletal: no clubbing / cyanosis. No joint deformity upper and lower extremities.  Skin: no rashes, lesions, ulcers. No induration Neurologic: No facial asymmetry, moving extremities spontaneously, speech fluent. Psychiatric: Normal judgment and insight. Alert and oriented x 3. Normal mood.   Labs on Admission: I have personally reviewed following labs and imaging studies  CBC: Recent Labs  Lab 03/29/24 2009  WBC 12.8*  NEUTROABS 7.5  HGB 10.0*  HCT 34.8*  MCV 89.0  PLT 300   Basic Metabolic Panel: Recent Labs  Lab 03/29/24 2009  NA 139  K 3.9  CL 95*  CO2 28  GLUCOSE 118*  BUN 16  CREATININE 1.13*  CALCIUM 9.3   GFR: Estimated Creatinine Clearance: 44.4 mL/min (A) (by C-G formula based on SCr of 1.13 mg/dL (H)). Liver Function Tests: Recent Labs  Lab 03/29/24 2009  AST 15  ALT <5  ALKPHOS 102  BILITOT 0.4  PROT 6.7  ALBUMIN  3.7   BNP (last 3 results) Recent Labs    01/14/24 1044 03/29/24 2009   PROBNP 3,687.0* 3,579.0*   Radiological Exams on Admission: No results found.  EKG: Independently reviewed.  Atrial fibrillation, rate 74, QTc 420.  No significant change from prior.  Assessment/Plan Principal Problem:   Acute on chronic diastolic CHF (  congestive heart failure) (HCC) Active Problems:   Essential hypertension   DM type 2 (diabetes mellitus, type 2) (HCC)   Acute on chronic respiratory failure (HCC)   COPD GOLD 3/ group E  still smoking   PAF (paroxysmal atrial fibrillation) (HCC)   Malignant neoplasm of unspecified part of left bronchus or lung (HCC)  Assessment and Plan:  Acute on chronic diastolic CHF-at least 2+ pitting bilateral lower extremity edema, BNP elevated at 3579.  She reports she has been taking Lasix  40 mg twice daily.  Chest X-ray without acute abnormality. - IV Lasix  40 twice daily - Input output, daily weights Daily BMP  Acute on chronic respiratory failure-patient is on 5 L at home at baseline, with activity here she intermittently desats to 80s. Reduced air entry bilaterally.  ?Possible COPD exacerbation also.  Falls-several falls, her legs just give out.  Reports hitting her head on 2 occasions.  She is on Eliquis . -Head CT - PT eval  COPD- Reduced air entry bilaterally, intermittent cough. - 125 mg IV Solu-Medrol  given, further steroid dosing pending clinical course.  Diabetic. - DuoNebs as needed and scheduled - Resume home regimen  Diabetes mellitus- uncontrolled.  Recent A1c 10/17- 8 - SSI- S - Hold glipizide   Hypertension-stable. - Resume diltiazem , bisoprolol , olmesartan   Atrial fibrillation- rate controlled on anticoagulation. 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  Completed radiation therapy.  Currently under surveillance-CT scanning every 6 months.  DVT prophylaxis: Eliquis  Code Status: FULL Family Communication: None at bedside Disposition Plan: ~ 2 days Consults called:  None  Admission status:  Obs Tele    Author: Tully FORBES Carwin, MD 03/29/2024 10:26 PM  For on call review [email or web address].     [1] No Known Allergies  "

## 2024-03-29 NOTE — ED Triage Notes (Signed)
 Pt reports increased leg swelling for a few weeks despite taking her fluid pills.  Pt reports feeling very short of breath today.

## 2024-03-30 ENCOUNTER — Other Ambulatory Visit (HOSPITAL_COMMUNITY): Payer: Self-pay

## 2024-03-30 DIAGNOSIS — C3492 Malignant neoplasm of unspecified part of left bronchus or lung: Secondary | ICD-10-CM | POA: Diagnosis not present

## 2024-03-30 DIAGNOSIS — F17210 Nicotine dependence, cigarettes, uncomplicated: Secondary | ICD-10-CM | POA: Diagnosis present

## 2024-03-30 DIAGNOSIS — E873 Alkalosis: Secondary | ICD-10-CM | POA: Diagnosis not present

## 2024-03-30 DIAGNOSIS — R296 Repeated falls: Secondary | ICD-10-CM | POA: Diagnosis present

## 2024-03-30 DIAGNOSIS — Z7951 Long term (current) use of inhaled steroids: Secondary | ICD-10-CM | POA: Diagnosis not present

## 2024-03-30 DIAGNOSIS — Z716 Tobacco abuse counseling: Secondary | ICD-10-CM | POA: Diagnosis not present

## 2024-03-30 DIAGNOSIS — Z7901 Long term (current) use of anticoagulants: Secondary | ICD-10-CM | POA: Diagnosis not present

## 2024-03-30 DIAGNOSIS — Z1152 Encounter for screening for COVID-19: Secondary | ICD-10-CM | POA: Diagnosis not present

## 2024-03-30 DIAGNOSIS — N1831 Chronic kidney disease, stage 3a: Secondary | ICD-10-CM | POA: Diagnosis present

## 2024-03-30 DIAGNOSIS — I5033 Acute on chronic diastolic (congestive) heart failure: Secondary | ICD-10-CM | POA: Diagnosis present

## 2024-03-30 DIAGNOSIS — Z7984 Long term (current) use of oral hypoglycemic drugs: Secondary | ICD-10-CM | POA: Diagnosis not present

## 2024-03-30 DIAGNOSIS — E1169 Type 2 diabetes mellitus with other specified complication: Secondary | ICD-10-CM | POA: Diagnosis not present

## 2024-03-30 DIAGNOSIS — I130 Hypertensive heart and chronic kidney disease with heart failure and stage 1 through stage 4 chronic kidney disease, or unspecified chronic kidney disease: Secondary | ICD-10-CM | POA: Diagnosis present

## 2024-03-30 DIAGNOSIS — J9621 Acute and chronic respiratory failure with hypoxia: Secondary | ICD-10-CM | POA: Diagnosis present

## 2024-03-30 DIAGNOSIS — I480 Paroxysmal atrial fibrillation: Secondary | ICD-10-CM | POA: Diagnosis present

## 2024-03-30 DIAGNOSIS — I10 Essential (primary) hypertension: Secondary | ICD-10-CM | POA: Diagnosis not present

## 2024-03-30 DIAGNOSIS — Z923 Personal history of irradiation: Secondary | ICD-10-CM | POA: Diagnosis not present

## 2024-03-30 DIAGNOSIS — Z79890 Hormone replacement therapy (postmenopausal): Secondary | ICD-10-CM | POA: Diagnosis not present

## 2024-03-30 DIAGNOSIS — E1165 Type 2 diabetes mellitus with hyperglycemia: Secondary | ICD-10-CM | POA: Diagnosis present

## 2024-03-30 DIAGNOSIS — J441 Chronic obstructive pulmonary disease with (acute) exacerbation: Secondary | ICD-10-CM | POA: Diagnosis present

## 2024-03-30 DIAGNOSIS — C3412 Malignant neoplasm of upper lobe, left bronchus or lung: Secondary | ICD-10-CM | POA: Diagnosis present

## 2024-03-30 DIAGNOSIS — R0902 Hypoxemia: Secondary | ICD-10-CM | POA: Diagnosis present

## 2024-03-30 DIAGNOSIS — Z79899 Other long term (current) drug therapy: Secondary | ICD-10-CM | POA: Diagnosis not present

## 2024-03-30 DIAGNOSIS — J449 Chronic obstructive pulmonary disease, unspecified: Secondary | ICD-10-CM | POA: Diagnosis not present

## 2024-03-30 DIAGNOSIS — Z8249 Family history of ischemic heart disease and other diseases of the circulatory system: Secondary | ICD-10-CM | POA: Diagnosis not present

## 2024-03-30 DIAGNOSIS — Z833 Family history of diabetes mellitus: Secondary | ICD-10-CM | POA: Diagnosis not present

## 2024-03-30 LAB — CBC
HCT: 35.1 % — ABNORMAL LOW (ref 36.0–46.0)
Hemoglobin: 10.3 g/dL — ABNORMAL LOW (ref 12.0–15.0)
MCH: 25.5 pg — ABNORMAL LOW (ref 26.0–34.0)
MCHC: 29.3 g/dL — ABNORMAL LOW (ref 30.0–36.0)
MCV: 86.9 fL (ref 80.0–100.0)
Platelets: 338 K/uL (ref 150–400)
RBC: 4.04 MIL/uL (ref 3.87–5.11)
RDW: 18.4 % — ABNORMAL HIGH (ref 11.5–15.5)
WBC: 11.5 K/uL — ABNORMAL HIGH (ref 4.0–10.5)
nRBC: 0 % (ref 0.0–0.2)

## 2024-03-30 LAB — BASIC METABOLIC PANEL WITH GFR
Anion gap: 6 (ref 5–15)
BUN: 16 mg/dL (ref 8–23)
CO2: 39 mmol/L — ABNORMAL HIGH (ref 22–32)
Calcium: 9.4 mg/dL (ref 8.9–10.3)
Chloride: 94 mmol/L — ABNORMAL LOW (ref 98–111)
Creatinine, Ser: 1.13 mg/dL — ABNORMAL HIGH (ref 0.44–1.00)
GFR, Estimated: 52 mL/min — ABNORMAL LOW
Glucose, Bld: 188 mg/dL — ABNORMAL HIGH (ref 70–99)
Potassium: 3.9 mmol/L (ref 3.5–5.1)
Sodium: 140 mmol/L (ref 135–145)

## 2024-03-30 LAB — GLUCOSE, CAPILLARY: Glucose-Capillary: 165 mg/dL — ABNORMAL HIGH (ref 70–99)

## 2024-03-30 LAB — CBG MONITORING, ED
Glucose-Capillary: 290 mg/dL — ABNORMAL HIGH (ref 70–99)
Glucose-Capillary: 300 mg/dL — ABNORMAL HIGH (ref 70–99)

## 2024-03-30 MED ORDER — NICOTINE 21 MG/24HR TD PT24
21.0000 mg | MEDICATED_PATCH | Freq: Every day | TRANSDERMAL | Status: DC
  Administered 2024-03-30 – 2024-04-01 (×3): 21 mg via TRANSDERMAL
  Filled 2024-03-30 (×3): qty 1

## 2024-03-30 MED ORDER — IPRATROPIUM-ALBUTEROL 0.5-2.5 (3) MG/3ML IN SOLN
3.0000 mL | Freq: Three times a day (TID) | RESPIRATORY_TRACT | Status: AC
  Administered 2024-03-30 (×3): 3 mL via RESPIRATORY_TRACT
  Filled 2024-03-30 (×2): qty 3

## 2024-03-30 MED ORDER — FUROSEMIDE 10 MG/ML IJ SOLN
60.0000 mg | Freq: Three times a day (TID) | INTRAMUSCULAR | Status: DC
  Administered 2024-03-30 – 2024-03-31 (×3): 60 mg via INTRAVENOUS
  Filled 2024-03-30 (×3): qty 6

## 2024-03-30 NOTE — ED Notes (Addendum)
 Patient transported to 338.

## 2024-03-30 NOTE — Progress Notes (Signed)
" °   03/30/24 1038  TOC Brief Assessment  Insurance and Status Reviewed  Patient has primary care physician Yes  Home environment has been reviewed From Home  Prior level of function: Independent  Prior/Current Home Services No current home services  Social Drivers of Health Review SDOH reviewed interventions complete  Readmission risk has been reviewed Yes  Transition of care needs no transition of care needs at this time    Inpatient Care Management (ICM) has reviewed patient and no other ICM needs have been identified at this time. We will continue to monitor patient advancement through interdisciplinary progression rounds. If new patient transition needs arise, please place a ICM consult. "

## 2024-03-30 NOTE — Hospital Course (Signed)
 73 y.o. female with medical history significant for chronic respiratory failure on 5 L, atrial fibrillation, COPD, diabetes mellitus, hypertension presented with complaints of difficulty breathing.   Assessment and Plan:   Acute on chronic hypoxic respiratory failure - Tachypnea with intermittent desaturations to the 80s.  Baseline O2 at 5 L.  Multifactorial etiology given underlying severe COPD, CHF, lung cancer.  Will attempt to wean O2 as tolerated.   Acute exacerbation of chronic HFpEF - Noted pitting, elevated BNP, congestion on chest x-ray.  Minimal urine output on current IV Lasix  regimen.  Will increase to IV Lasix  60 mg every 8 hours.  Monitor urine output.  Wean O2 as tolerated.   Possible COPD exacerbation - Intermittent cough with reduced air movement however very poor lung function at baseline.  Received IV Solu-Medrol  x 1, scheduled nebulizers.  Resume home regimen.   Tobacco dependence - Likely exacerbating all of above.  Encourage patient to quit.  Patient states she has been smoking for over 50 years.  Will order nicotine  patch.   Diabetes mellitus - Insulin  sliding scale on board.   Hypertension/atrial fibrillation - Resume home medication regiment.   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  Currently under surveillance.

## 2024-03-30 NOTE — Progress Notes (Signed)
 Patient calm & cooperative. Weaned from 5L [place] to RA to 1L [place] (at times). Snack provided. Slept well overnight.

## 2024-03-30 NOTE — ED Notes (Signed)
 BGL 300

## 2024-03-30 NOTE — ED Notes (Addendum)
 While cleaning up patient from bowel movement, her oxygen  desated to 84%. Patient bumped up to 2L nasal cannula and oxygen  sats jumped to 96%. MD made aware.

## 2024-03-30 NOTE — ED Notes (Signed)
BGL 290

## 2024-03-30 NOTE — Progress Notes (Signed)
 " Progress Note   Patient: Holly May FMW:[id number] DOB: 07-21-1950 DOA: 03/29/2024  DOS: the patient was seen and examined on 03/30/2024   Brief hospital course:  73 y.o. female with medical history significant for chronic respiratory failure on 5 L, atrial fibrillation, COPD, diabetes mellitus, hypertension presented with complaints of difficulty breathing.  Assessment and Plan:  Acute on chronic hypoxic respiratory failure - Tachypnea with intermittent desaturations to the 80s.  Baseline O2 at 5 L.  Multifactorial etiology given underlying severe COPD, CHF, lung cancer.  Will attempt to wean O2 as tolerated.  Acute exacerbation of chronic HFpEF - Noted pitting, elevated BNP, congestion on chest x-ray.  Minimal urine output on current IV Lasix  regimen.  Will increase to IV Lasix  60 mg every 8 hours.  Monitor urine output.  Wean O2 as tolerated.  Possible COPD exacerbation - Intermittent cough with reduced air movement however very poor lung function at baseline.  Received IV Solu-Medrol  x 1, scheduled nebulizers.  Resume home regimen.  Tobacco dependence - Likely exacerbating all of above.  Encourage patient to quit.  Patient states she has been smoking for over 50 years.  Will order nicotine  patch.  Diabetes mellitus - Insulin  sliding scale on board.  Hypertension/atrial fibrillation - Resume home medication regiment.  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  Currently under surveillance.   Subjective: Patient resting comfortably this morning, still somewhat short of breath however nasal cannula down to 4 L.  Not much urine output, still has edema in BL LE.  Patient with nonproductive cough as well.  Denies fever, purulent sputum, chest pain, nausea, vomiting, abdominal pain.  Physical Exam:  Vitals:   03/30/24 1013 03/30/24 1015 03/30/24 1030 03/30/24 1045  BP: (!) 142/80 (!) 142/80 (!) 136/95 (!) 135/97  Pulse:  (!) 105 (!) 119 (!) 104   Resp:  (!) 21 17 16   Temp:      TempSrc:      SpO2:  95% 98% 98%  Weight:        GENERAL:  Alert, pleasant, no acute distress HEENT:  EOMI, nasal cannula CARDIOVASCULAR:  RRR, no murmurs appreciated RESPIRATORY: Dry cough, poor air movement bilaterally GASTROINTESTINAL:  Soft, nontender, nondistended EXTREMITIES: 2+ BL LE edema  NEURO:  No new focal deficits appreciated SKIN:  No rashes noted PSYCH:  Appropriate mood and affect     Data Reviewed:  Imaging Studies: CT HEAD WO CONTRAST ( ) Result Date: 03/29/2024 EXAM: CT HEAD WITHOUT CONTRAST 03/29/2024 11:00:10 PM TECHNIQUE: CT of the head was performed without the administration of intravenous contrast. Automated exposure control, iterative reconstruction, and/or weight based adjustment of the mA/kV was utilized to reduce the radiation dose to as low as reasonably achievable. COMPARISON: 07/23/2023 CLINICAL HISTORY: Falls, trauma to head, on Eliquis . History of subdural hematoma. FINDINGS: BRAIN AND VENTRICLES: No acute hemorrhage. No evidence of acute infarct. No hydrocephalus. No extra-axial collection. No mass effect or midline shift. Generalized cerebral atrophy. Ill-defined hypoattenuation within cerebral white matter consistent with chronic small vessel ischemic disease, advanced for age, similar to prior study. 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 Intracranial arterial calcification. ORBITS: No acute abnormality. SINUSES: Mild mucosal thickening in right maxillary sinus. Moderate bilateral mastoid effusions. SOFT TISSUES AND SKULL: No acute soft tissue abnormality. No skull fracture. IMPRESSION: 1. No acute intracranial abnormality. 2. Generalized cerebral atrophy and chronic small vessel ischemic disease, advanced for age, similar to prior study. Electronically signed by: Franky Stanford MD  03/29/2024 11:46 PM EST RP Workstation: [id number]   DG Chest Port 1 View Result Date: 03/29/2024 CLINICAL DATA:   Shortness of breath. EXAM: PORTABLE CHEST 1 VIEW COMPARISON:  01/17/2024 FINDINGS: The heart is within normal limits in size given the AP projection and portable technique. Stable tortuosity and calcification of the thoracic aorta. No acute pulmonary process. Linear scarring type changes noted in the left mid lung region. The bony thorax is intact. IMPRESSION: No acute cardiopulmonary findings. Electronically Signed   By: MYRTIS Stammer M.D.   On: 03/29/2024 22:08    There are no new results to review at this time.  Previous records (including but not limited to H&P, progress notes, nursing notes, TOC management) were reviewed in assessment of this patient.  Labs: CBC: Recent Labs  Lab 03/29/24 2009 03/30/24 0321  WBC 12.8* 11.5*  NEUTROABS 7.5  --   HGB 10.0* 10.3*  HCT 34.8* 35.1*  MCV 89.0 86.9  PLT 300 338   Basic Metabolic Panel: Recent Labs  Lab 03/29/24 2009 03/30/24 0321  NA 139 140  K 3.9 3.9  CL 95* 94*  CO2 28 39*  GLUCOSE 118* 188*  BUN 16 16  CREATININE 1.13* 1.13*  CALCIUM 9.3 9.4   Liver Function Tests: Recent Labs  Lab 03/29/24 2009  AST 15  ALT <5  ALKPHOS 102  BILITOT 0.4  PROT 6.7  ALBUMIN  3.7   CBG: Recent Labs  Lab 03/29/24 2259 03/30/24 0800  GLUCAP 111* 290*    Scheduled Meds:  apixaban   5 mg Oral BID   bisoprolol   10 mg Oral Daily   budesonide -glycopyrrolate -formoterol   2 puff Inhalation BID   diltiazem   240 mg Oral Daily   furosemide   60 mg Intravenous Q8H   insulin  aspart  0-5 Units Subcutaneous QHS   insulin  aspart  0-9 Units Subcutaneous TID WC   ipratropium-albuterol   3 mL Nebulization TID   irbesartan   300 mg Oral Daily   levothyroxine   25 mcg Oral QAC breakfast   nicotine   21 mg Transdermal Daily   pantoprazole   40 mg Oral BID   Continuous Infusions: PRN Meds:.acetaminophen  **OR** acetaminophen , budesonide , guaiFENesin -dextromethorphan , polyethylene glycol, promethazine  Family Communication: None at  bedside  Disposition: Status is: Observation The patient will require care spanning > 2 midnights and should be moved to inpatient because: Acute on chronic hypoxic respiratory failure, exacerbation of CHF     Time spent: 41 minutes  Length of inpatient stay: 0 days  Author: Carliss LELON Canales, DO 03/30/2024 11:18 AM  For on call review [email or web address].   "

## 2024-03-30 NOTE — Inpatient Diabetes Management (Signed)
 Inpatient Diabetes Program Recommendations  AACE/ADA: New Consensus Statement on Inpatient Glycemic Control  Target Ranges:  Prepandial:   less than 140 mg/dL      Peak postprandial:   less than 180 mg/dL (1-2 hours)      Critically ill patients:  140 - 180 mg/dL    Latest Reference Range & Units 03/29/24 22:59 03/30/24 08:00 03/30/24 11:41  Glucose-Capillary 70 - 99 mg/dL 888 (H) 709 (H) 699 (H)   Review of Glycemic Control  Diabetes history: DM2 Outpatient Diabetes medications: Glipizide  5 mg BID Current orders for Inpatient glycemic control: Novolog  0-9 units TID with meals, Novolog  0-5 units QHS  Inpatient Diabetes Program Recommendations:    Insulin : Noted patient received Solumedrol 80 mg at 20:20 on 12/29 which is contributing to hyperglycemia. If CBGs continue to be over 180 mg/dl, may want to consider ordering insulin  glargine 5 units Q24H.  Thanks, Earnie Gainer, RN, MSN, CDCES Diabetes Coordinator Inpatient Diabetes Program [phone] (Team Pager from 8am to 5pm)

## 2024-03-30 NOTE — ED Notes (Signed)
 Report called to receiving RN @ 1255.

## 2024-03-31 DIAGNOSIS — J449 Chronic obstructive pulmonary disease, unspecified: Secondary | ICD-10-CM | POA: Diagnosis not present

## 2024-03-31 DIAGNOSIS — E1169 Type 2 diabetes mellitus with other specified complication: Secondary | ICD-10-CM | POA: Diagnosis not present

## 2024-03-31 DIAGNOSIS — I480 Paroxysmal atrial fibrillation: Secondary | ICD-10-CM | POA: Diagnosis not present

## 2024-03-31 DIAGNOSIS — J9621 Acute and chronic respiratory failure with hypoxia: Secondary | ICD-10-CM | POA: Diagnosis not present

## 2024-03-31 DIAGNOSIS — I5033 Acute on chronic diastolic (congestive) heart failure: Secondary | ICD-10-CM | POA: Diagnosis not present

## 2024-03-31 DIAGNOSIS — I10 Essential (primary) hypertension: Secondary | ICD-10-CM | POA: Diagnosis not present

## 2024-03-31 DIAGNOSIS — C3492 Malignant neoplasm of unspecified part of left bronchus or lung: Secondary | ICD-10-CM | POA: Diagnosis not present

## 2024-03-31 LAB — CBC
HCT: 29.6 % — ABNORMAL LOW (ref 36.0–46.0)
Hemoglobin: 8.8 g/dL — ABNORMAL LOW (ref 12.0–15.0)
MCH: 25.5 pg — ABNORMAL LOW (ref 26.0–34.0)
MCHC: 29.7 g/dL — ABNORMAL LOW (ref 30.0–36.0)
MCV: 85.8 fL (ref 80.0–100.0)
Platelets: 313 K/uL (ref 150–400)
RBC: 3.45 MIL/uL — ABNORMAL LOW (ref 3.87–5.11)
RDW: 18.6 % — ABNORMAL HIGH (ref 11.5–15.5)
WBC: 18.3 K/uL — ABNORMAL HIGH (ref 4.0–10.5)
nRBC: 0 % (ref 0.0–0.2)

## 2024-03-31 LAB — BASIC METABOLIC PANEL WITH GFR
BUN: 24 mg/dL — ABNORMAL HIGH (ref 8–23)
CO2: >45 mmol/L — ABNORMAL HIGH (ref 22–32)
Calcium: 9 mg/dL (ref 8.9–10.3)
Chloride: 90 mmol/L — ABNORMAL LOW (ref 98–111)
Creatinine, Ser: 1.27 mg/dL — ABNORMAL HIGH (ref 0.44–1.00)
GFR, Estimated: 45 mL/min — ABNORMAL LOW
Glucose, Bld: 163 mg/dL — ABNORMAL HIGH (ref 70–99)
Potassium: 3.8 mmol/L (ref 3.5–5.1)
Sodium: 137 mmol/L (ref 135–145)

## 2024-03-31 LAB — GLUCOSE, CAPILLARY
Glucose-Capillary: 142 mg/dL — ABNORMAL HIGH (ref 70–99)
Glucose-Capillary: 172 mg/dL — ABNORMAL HIGH (ref 70–99)
Glucose-Capillary: 115 mg/dL — ABNORMAL HIGH (ref 70–99)
Glucose-Capillary: 138 mg/dL — ABNORMAL HIGH (ref 70–99)

## 2024-03-31 MED ORDER — IPRATROPIUM-ALBUTEROL 0.5-2.5 (3) MG/3ML IN SOLN
3.0000 mL | Freq: Three times a day (TID) | RESPIRATORY_TRACT | Status: DC
  Administered 2024-03-31 – 2024-04-01 (×6): 3 mL via RESPIRATORY_TRACT
  Filled 2024-03-31 (×5): qty 3

## 2024-03-31 MED ORDER — FUROSEMIDE 10 MG/ML IJ SOLN
60.0000 mg | Freq: Two times a day (BID) | INTRAMUSCULAR | Status: DC
  Administered 2024-03-31 – 2024-04-01 (×2): 60 mg via INTRAVENOUS
  Filled 2024-03-31 (×2): qty 6

## 2024-03-31 MED ORDER — IPRATROPIUM-ALBUTEROL 0.5-2.5 (3) MG/3ML IN SOLN
RESPIRATORY_TRACT | Status: AC
  Filled 2024-03-31: qty 3

## 2024-03-31 MED ORDER — ACETAZOLAMIDE SODIUM 500 MG IJ SOLR
500.0000 mg | Freq: Once | INTRAMUSCULAR | Status: AC
  Administered 2024-03-31: 500 mg via INTRAVENOUS
  Filled 2024-03-31: qty 500

## 2024-03-31 MED ORDER — ALBUTEROL SULFATE (2.5 MG/3ML) 0.083% IN NEBU: 2.5000 mg | INHALATION_SOLUTION | RESPIRATORY_TRACT | Status: DC | PRN

## 2024-03-31 NOTE — Progress Notes (Signed)
 Mobility Specialist Progress Note:    03/31/24 1025  Mobility  Activity Pivoted/transferred from bed to chair  Level of Assistance Minimal assist, patient does 75% or more  Assistive Device Other (Comment) (1 HHA)  Distance Ambulated (ft) 2 ft  Range of Motion/Exercises Active;All extremities  Activity Response Tolerated well  Mobility Referral Yes  Mobility visit 1 Mobility  Mobility Specialist Start Time (ACUTE ONLY) 1025  Mobility Specialist Stop Time (ACUTE ONLY) 1045  Mobility Specialist Time Calculation (min) (ACUTE ONLY) 20 min   Pt received supine, agreeable to mobility. Required MinA to stand and pivot with 1 hand-held assist. Tolerated well, asx throughout. Call bell in reach, family and NT in room. All needs met.  Holly May Mobility Specialist Please contact via Special Educational Needs Teacher or  Rehab office at [phone]

## 2024-03-31 NOTE — Progress Notes (Signed)
 Patient noted to have respiratory distress after using bedside commode and attempting to get onto the weighing scale. Patient became SOB with increased work of breathing. Vital signs obtained, HR elevated. RT notified and at bedside, breathing treatment administered as ordered. Patient placed in comfortable position. Patient has had history of AFIB so EKG was done. HR and respiratory status gradually improving post-intervention. Will continue to monitor closely.

## 2024-03-31 NOTE — Plan of Care (Signed)
" °  Problem: Education: Goal: Ability to describe self-care measures that may prevent or decrease complications (Diabetes Survival Skills Education) will improve Outcome: Progressing   Problem: Coping: Goal: Ability to adjust to condition or change in health will improve Outcome: Progressing   Problem: Metabolic: Goal: Ability to maintain appropriate glucose levels will improve Outcome: Progressing   Problem: Skin Integrity: Goal: Risk for impaired skin integrity will decrease Outcome: Progressing   Problem: Education: Goal: Knowledge of General Education information will improve Description: Including pain rating scale, medication(s)/side effects and non-pharmacologic comfort measures Outcome: Progressing   Problem: Clinical Measurements: Goal: Respiratory complications will improve Outcome: Progressing   Problem: Activity: Goal: Risk for activity intolerance will decrease Outcome: Progressing   Problem: Pain Managment: Goal: General experience of comfort will improve and/or be controlled Outcome: Progressing   Problem: Safety: Goal: Ability to remain free from injury will improve Outcome: Progressing   Problem: Education: Goal: Ability to verbalize understanding of medication therapies will improve Outcome: Progressing   Problem: Cardiac: Goal: Ability to achieve and maintain adequate cardiopulmonary perfusion will improve Outcome: Progressing   "

## 2024-03-31 NOTE — Progress Notes (Signed)
 Was called to patient's room by RN due to increased WOB after patient went to bathroom.  Patient was pursed lipped breathing and upon auscultation, patient was wheezing.  PRN treatment was given to patient and patient was hooked up to EKG machine to make sure nothing cardiac was occurring.  Patient has hx of CHF and AFib with RVR.  Patient had elevated HR and BP, but after treatment and placing patient on [place] HFNC, and getting her to calm down some, her HR came back down, rate improved and patient looked more relaxed.  Patient is currently on 4L [place] with adequate sats.  RN was still at bedside.

## 2024-03-31 NOTE — TOC Initial Note (Signed)
 Transition of Care [facility]) - Initial/Assessment Note    Patient Details  Name: Holly May MRN: [id number] Date of Birth: 31-Aug-1950  [facility]) CM/SW Contact:    Lucie Lunger, LCSWA Phone Number: 03/31/2024, 9:26 AM  Clinical Narrative:                 Pt is high risk for readmission. CSW spoke with pt to complete assessment. Pt states her family stays with her in the home. Pt states she has assistance with her ADLs. Pts sister and niece are able to provide transportation when needed. Pt has home O2 with [employer] and received a walker from [employer] in October of 2025. Pt inquired about a wheelchair being ordered. CSW explained that insurnace will not cover this due to recently getting walker. CSW provided pt with info for [employer] and will add to AVS. Per notes pt had HH arranged at admission in Oct 2025 at [facility]. CSW spoke to pt about if she was still being seen. Pt states she never had HH come out and never received a call for services to be started. TOC to follow.   Expected Discharge Plan: Home w Home Health Services Barriers to Discharge: Continued Medical Work up   Patient Goals and CMS Choice Patient states their goals for this hospitalization and ongoing recovery are:: get better CMS Medicare.gov Compare Post Acute Care list provided to:: Patient Choice offered to / list presented to : Patient      Expected Discharge Plan and Services In-house Referral: Clinical Social Work Discharge Planning Services: CM Consult Post Acute Care Choice: Home Health Living arrangements for the past 2 months: Single Family Home                                      Prior Living Arrangements/Services Living arrangements for the past 2 months: Single Family Home Lives with:: Relatives Patient language and need for interpreter reviewed:: Yes Do you feel safe going back to the place where you live?: Yes      Need for Family Participation in Patient Care:  Yes (Comment) Care giver support system in place?: Yes (comment) Current home services: DME Criminal Activity/Legal Involvement Pertinent to Current Situation/Hospitalization: No - Comment as needed  Activities of Daily Living   ADL Screening (condition at time of admission) Independently performs ADLs?: No Does the patient have a NEW difficulty with bathing/dressing/toileting/self-feeding that is expected to last >3 days?: Yes (Initiates electronic notice to provider for possible OT consult) Does the patient have a NEW difficulty with getting in/out of bed, walking, or climbing stairs that is expected to last >3 days?: Yes (Initiates electronic notice to provider for possible PT consult) Does the patient have a NEW difficulty with communication that is expected to last >3 days?: No Is the patient deaf or have difficulty hearing?: No Does the patient have difficulty seeing, even when wearing glasses/contacts?: No Does the patient have difficulty concentrating, remembering, or making decisions?: No  Permission Sought/Granted                  Emotional Assessment Appearance:: Appears stated age Attitude/Demeanor/Rapport: Engaged Affect (typically observed): Accepting Orientation: : Oriented to Self, Oriented to Place, Oriented to  Time, Oriented to Situation Alcohol / Substance Use: Not Applicable Psych Involvement: No (comment)  Admission diagnosis:  Hypoxia [R09.02] COPD exacerbation (HCC) [J44.1] Acute diastolic congestive  heart failure (HCC) [I50.31] COPD mixed type (HCC) [J44.9] Acute on chronic hypoxic respiratory failure (HCC) [J96.21] Patient Active Problem List   Diagnosis Date Noted   Acute on chronic hypoxic respiratory failure (HCC) 03/30/2024   Acute on chronic diastolic CHF (congestive heart failure) (HCC) 03/29/2024   Atrial fibrillation with RVR (HCC) 01/14/2024   Acute respiratory failure with hypoxia (HCC) 05/31/2023   Urinary tract infection 05/31/2023    Renal insufficiency 05/31/2023   History of lung cancer 05/31/2023   Left hip pain 05/31/2023   Fall at home, initial encounter 05/31/2023   Cardiac arrhythmia 02/11/2023   Malignant neoplasm of unspecified part of left bronchus or lung (HCC) 02/11/2023   Anorexia 02/11/2023   PAF (paroxysmal atrial fibrillation) (HCC) 01/17/2023   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) 07/30/2022   Atrial tachycardia 07/17/2022   CAP (community acquired pneumonia) 07/17/2022   COPD exacerbation (HCC) 07/17/2022   Sepsis (HCC) 07/16/2022   Abnormal CT lung screening 07/09/2022   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   Hyperlipidemia 07/09/2022   Insomnia 07/09/2022   Leukocytosis 07/09/2022   Major depression, single episode 07/09/2022   Neurogenic dysfunction of the urinary bladder 07/09/2022   Osteoporosis 07/09/2022   Other specified disorders of kidney and ureter 07/09/2022   Nocturnal hypoxemia 07/09/2022   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   Vitamin D deficiency 07/09/2022   Wedge compression fracture of unspecified thoracic vertebra, initial encounter for closed fracture (HCC) 07/09/2022   Pulmonary nodule 06/20/2022   Abnormal PET of left lung 06/20/2022   GERD (gastroesophageal reflux disease) 04/16/2022   Multiple pulmonary nodules determined by computed tomography of lung 04/06/2022   History of thymoma 03/31/2019   Mediastinal mass 02/18/2019   Closed displaced fracture of proximal phalanx of left middle finger 04/10/2017   Acute on chronic respiratory failure (HCC) 06/12/2016   COPD GOLD 3/ group E  still smoking 06/12/2016   Elevated troponin 06/12/2016   Hypokalemia 06/12/2016   Acute renal failure (ARF) 06/12/2016   Preoperative cardiovascular examination 05/18/2014   Prolonged QT interval 05/18/2014   Humerus shaft fracture 04/06/2013   Closed fracture of part of upper end of humerus 04/06/2013   Syncope 03/31/2013   Fracture of left humerus 03/31/2013   DM type 2 (diabetes  mellitus, type 2) (HCC) 03/31/2013   Essential hypertension 03/31/2013   Heme positive stool 03/31/2013   Normocytic anemia 03/31/2013   Cigarette smoker 03/31/2013   Fracture of thoracic spine (HCC) 06/23/2011   Hemomediastinum 06/22/2011   Multiple rib fractures 06/22/2011   SDH (subdural hematoma) (HCC) 06/22/2011   PCP:  Katrinka Aquas, MD Pharmacy:   [facility] - [place], [place] - [address] [place] [address] Phone: [phone] Fax: [phone]     Social Drivers of Health (SDOH) Social History: SDOH Screenings   Food Insecurity: No Food Insecurity (03/30/2024)  Recent Concern: Food Insecurity - Food Insecurity Present (01/14/2024)  Housing: Low Risk (03/30/2024)  Transportation Needs: No Transportation Needs (03/30/2024)  Utilities: Not At Risk (03/30/2024)  Alcohol Screen: Low Risk (07/30/2022)  Depression (PHQ2-9): Low Risk (01/14/2024)  Financial Resource Strain: Medium Risk (11/04/2023)   Received from [facility]  Social Connections: Moderately Isolated (03/30/2024)  Tobacco Use: High Risk (03/29/2024)   SDOH Interventions:     Readmission Risk Interventions    03/31/2024    9:25 AM 01/19/2024    2:30 PM  Readmission Risk Prevention Plan  Transportation Screening Complete Complete  HRI or Home Care  Consult Complete Complete  Social Work Consult for Recovery Care Planning/Counseling Complete Complete  Palliative Care Screening Not Applicable Not Applicable  Medication Review Oceanographer) Complete Complete

## 2024-03-31 NOTE — Progress Notes (Signed)
 " Progress Note   Patient: Holly May FMW:[id number] DOB: [birth year] DOA: 03/29/2024  DOS: the patient was seen and examined on 03/31/2024   Brief hospital course:  73 y.o. female with medical history significant for chronic respiratory failure on 5 L, atrial fibrillation, COPD, diabetes mellitus, hypertension presented with complaints of difficulty breathing.  Assessment and Plan:  Acute on chronic hypoxic respiratory failure - Tachypnea with intermittent desaturations to the 80s.  Baseline O2 at 4-5 L.  Multifactorial etiology given underlying severe COPD, CHF, lung cancer.   Continue to wean down oxygen  supplementation and continue supportive care.-  Acute exacerbation of chronic HFpEF - Noted pitting, elevated BNP, congestion on chest x-ray.  - Continue IV diuresis - Patient developing metabolic alkalosis; Diamox will be provided - Continue to follow daily weights, strict intake and output/urine output and low-sodium diet.  COPD  - Intermittent cough with reduced air movement however very poor lung function at baseline.  -Continue bronchodilator management - Continue mucolytics - Continue oxygen  supplementation.  Tobacco dependence - Likely exacerbating all of above.  Encourage patient to quit.  Patient states she has been smoking for over 50 years.   - Cessation counseling provided - Continue nicotine  patch.  Diabetes mellitus - Insulin  sliding scale on board. - Follow CBG fluctuation and adjust hypoglycemia regimen as needed.  Hypertension/atrial fibrillation - Resume home medication regiment. - Continue to follow vital signs.  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  Currently under surveillance.   Subjective:  3 L in second supplementation in place; patient reports short winded sensation with activity and orthopnea overnight.  Trace to 1+ edema appreciated on her lower extremities bilaterally.  No chest pain, no nausea, no  vomiting.  Physical Exam:  Vitals:   03/31/24 0831 03/31/24 1001 03/31/24 1348 03/31/24 1444  BP:  118/67  139/74  Pulse:  (!) 106  87  Resp:    20  Temp:    98.4 F (36.9 C)  TempSrc:    Oral  SpO2: 100% 100% 100% 100%  Weight:      Height:       General exam: Alert, awake, oriented x 3; still short winded with activity and reporting some orthopnea overnight.  Patient denies nausea, vomiting or chest pain. Respiratory system: Fine crackles at the bases; no wheezing appreciated on exam.  3 L nasal cannula supplementation in place. Cardiovascular system: No palpitations, rate controlled, no rubs, no gallops, no JVD. Gastrointestinal system: Abdomen is nondistended, soft and nontender. No organomegaly or masses felt. Normal bowel sounds heard. Central nervous system: Generally weak.  No focal neurological deficits. Extremities: No cyanosis or clubbing; trace to 1+ edema appreciated bilaterally. Skin: No petechiae. Psychiatry: Judgement and insight appear normal. Mood & affect appropriate.    Data Reviewed:  Imaging Studies: CT HEAD WO CONTRAST ( ) Result Date: 03/29/2024 EXAM: CT HEAD WITHOUT CONTRAST 03/29/2024 11:00:10 PM TECHNIQUE: CT of the head was performed without the administration of intravenous contrast. Automated exposure control, iterative reconstruction, and/or weight based adjustment of the mA/kV was utilized to reduce the radiation dose to as low as reasonably achievable. COMPARISON: 07/23/2023 CLINICAL HISTORY: Falls, trauma to head, on Eliquis . History of subdural hematoma. FINDINGS: BRAIN AND VENTRICLES: No acute hemorrhage. No evidence of acute infarct. No hydrocephalus. No extra-axial collection. No mass effect or midline shift. Generalized cerebral atrophy. Ill-defined hypoattenuation within cerebral white matter consistent with chronic small vessel ischemic disease, advanced for age, similar to prior study. Small remote  infarcts in left caudate and right thalamus.  Intracranial arterial calcification. ORBITS: No acute abnormality. SINUSES: Mild mucosal thickening in right maxillary sinus. Moderate bilateral mastoid effusions. SOFT TISSUES AND SKULL: No acute soft tissue abnormality. No skull fracture. IMPRESSION: 1. No acute intracranial abnormality. 2. Generalized cerebral atrophy and chronic small vessel ischemic disease, advanced for age, similar to prior study. Electronically signed by: Franky Stanford MD 03/29/2024 11:46 PM EST RP Workstation: [id number]   DG Chest Port 1 View Result Date: 03/29/2024 CLINICAL DATA:  Shortness of breath. EXAM: PORTABLE CHEST 1 VIEW COMPARISON:  01/17/2024 FINDINGS: The heart is within normal limits in size given the AP projection and portable technique. Stable tortuosity and calcification of the thoracic aorta. No acute pulmonary process. Linear scarring type changes noted in the left mid lung region. The bony thorax is intact. IMPRESSION: No acute cardiopulmonary findings. Electronically Signed   By: MYRTIS Stammer M.D.   On: 03/29/2024 22:08   Labs: CBC: Recent Labs  Lab 03/29/24 2009 03/30/24 0321 03/31/24 0458  WBC 12.8* 11.5* 18.3*  NEUTROABS 7.5  --   --   HGB 10.0* 10.3* 8.8*  HCT 34.8* 35.1* 29.6*  MCV 89.0 86.9 85.8  PLT 300 338 313   Basic Metabolic Panel: Recent Labs  Lab 03/29/24 2009 03/30/24 0321 03/31/24 0458  NA 139 140 137  K 3.9 3.9 3.8  CL 95* 94* 90*  CO2 28 39* >45*  GLUCOSE 118* 188* 163*  BUN 16 16 24*  CREATININE 1.13* 1.13* 1.27*  CALCIUM 9.3 9.4 9.0   Liver Function Tests: Recent Labs  Lab 03/29/24 2009  AST 15  ALT <5  ALKPHOS 102  BILITOT 0.4  PROT 6.7  ALBUMIN  3.7   CBG: Recent Labs  Lab 03/30/24 1141 03/30/24 2221 03/31/24 0752 03/31/24 1208 03/31/24 1643  GLUCAP 300* 165* 142* 172* 115*    Scheduled Meds:  apixaban   5 mg Oral BID   bisoprolol   10 mg Oral Daily   budesonide -glycopyrrolate -formoterol   2 puff Inhalation BID   diltiazem   240 mg Oral Daily    furosemide   60 mg Intravenous Q12H   insulin  aspart  0-5 Units Subcutaneous QHS   insulin  aspart  0-9 Units Subcutaneous TID WC   ipratropium-albuterol   3 mL Nebulization TID   irbesartan   300 mg Oral Daily   levothyroxine   25 mcg Oral QAC breakfast   nicotine   21 mg Transdermal Daily   pantoprazole   40 mg Oral BID   Continuous Infusions: PRN Meds:.acetaminophen  **OR** acetaminophen , albuterol , budesonide , guaiFENesin -dextromethorphan , polyethylene glycol, promethazine  Family Communication: None at bedside  Disposition: Status is: Observation The patient will require care spanning > 2 midnights and should be moved to inpatient because: Acute on chronic hypoxic respiratory failure, exacerbation of CHF  Time spent: 50 minutes  Length of inpatient stay: 1 days  Author: Eric Nunnery, MD 03/31/2024 6:49 PM  For on call review [email or web address].   "

## 2024-04-01 ENCOUNTER — Other Ambulatory Visit: Payer: Self-pay | Admitting: Internal Medicine

## 2024-04-01 DIAGNOSIS — I10 Essential (primary) hypertension: Secondary | ICD-10-CM | POA: Diagnosis not present

## 2024-04-01 DIAGNOSIS — E1169 Type 2 diabetes mellitus with other specified complication: Secondary | ICD-10-CM | POA: Diagnosis not present

## 2024-04-01 DIAGNOSIS — I5033 Acute on chronic diastolic (congestive) heart failure: Secondary | ICD-10-CM | POA: Diagnosis not present

## 2024-04-01 DIAGNOSIS — J449 Chronic obstructive pulmonary disease, unspecified: Secondary | ICD-10-CM | POA: Diagnosis not present

## 2024-04-01 LAB — BASIC METABOLIC PANEL WITH GFR
Anion gap: 4 — ABNORMAL LOW (ref 5–15)
BUN: 23 mg/dL (ref 8–23)
CO2: 42 mmol/L — ABNORMAL HIGH (ref 22–32)
Calcium: 9.1 mg/dL (ref 8.9–10.3)
Chloride: 93 mmol/L — ABNORMAL LOW (ref 98–111)
Creatinine, Ser: 1.27 mg/dL — ABNORMAL HIGH (ref 0.44–1.00)
GFR, Estimated: 45 mL/min — ABNORMAL LOW
Glucose, Bld: 115 mg/dL — ABNORMAL HIGH (ref 70–99)
Potassium: 3.2 mmol/L — ABNORMAL LOW (ref 3.5–5.1)
Sodium: 139 mmol/L (ref 135–145)

## 2024-04-01 LAB — GLUCOSE, CAPILLARY
Glucose-Capillary: 125 mg/dL — ABNORMAL HIGH (ref 70–99)
Glucose-Capillary: 204 mg/dL — ABNORMAL HIGH (ref 70–99)

## 2024-04-01 LAB — MAGNESIUM: Magnesium: 2.3 mg/dL (ref 1.7–2.4)

## 2024-04-01 MED ORDER — TORSEMIDE 20 MG PO TABS: 60.0000 mg | ORAL_TABLET | Freq: Every day | ORAL | 2 refills | Status: AC

## 2024-04-01 MED ORDER — LEVOTHYROXINE SODIUM 25 MCG PO TABS: 25.0000 ug | ORAL_TABLET | Freq: Every day | ORAL | 1 refills | Status: AC

## 2024-04-01 MED ORDER — POTASSIUM CHLORIDE CRYS ER 20 MEQ PO TBCR
40.0000 meq | EXTENDED_RELEASE_TABLET | Freq: Once | ORAL | Status: AC
  Administered 2024-04-01: 40 meq via ORAL
  Filled 2024-04-01: qty 2

## 2024-04-01 MED ORDER — BISOPROLOL FUMARATE 10 MG PO TABS: 10.0000 mg | ORAL_TABLET | Freq: Every day | ORAL | 2 refills | Status: AC

## 2024-04-01 MED ORDER — TRELEGY ELLIPTA 100-62.5-25 MCG/ACT IN AEPB: 1.0000 | INHALATION_SPRAY | Freq: Every day | RESPIRATORY_TRACT | 1 refills | Status: AC

## 2024-04-01 MED ORDER — NICOTINE 21 MG/24HR TD PT24: 21.0000 mg | MEDICATED_PATCH | Freq: Every day | TRANSDERMAL | 1 refills | Status: AC

## 2024-04-01 NOTE — Plan of Care (Signed)
" °  Problem: Education: Goal: Ability to describe self-care measures that may prevent or decrease complications (Diabetes Survival Skills Education) will improve Outcome: Progressing   Problem: Fluid Volume: Goal: Ability to maintain a balanced intake and output will improve Outcome: Progressing   Problem: Education: Goal: Knowledge of General Education information will improve Description: Including pain rating scale, medication(s)/side effects and non-pharmacologic comfort measures Outcome: Progressing   Problem: Education: Goal: Ability to demonstrate management of disease process will improve Outcome: Progressing   Problem: Activity: Goal: Capacity to carry out activities will improve Outcome: Progressing   Problem: Cardiac: Goal: Ability to achieve and maintain adequate cardiopulmonary perfusion will improve Outcome: Progressing   "

## 2024-04-01 NOTE — Discharge Summary (Signed)
 " Physician Discharge Summary   Patient: Holly May MRN: [id number] DOB: 1950-12-25  Admit date:     03/29/2024  Discharge date: 04/01/2024  Discharge Physician: Eric Nunnery   PCP: Katrinka Aquas, MD   Recommendations at discharge:  Repeat basic metabolic panel to follow electrolytes and renal function Continue close monitoring of patient's CBG fluctuation with further adjustment to hypoglycemic regimen as needed Reassess blood pressure and adjust antihypertensive remain as required Continue assisting patient with smoking cessation.  Discharge Diagnoses: Principal Problem:   Acute on chronic diastolic CHF (congestive heart failure) (HCC) Active Problems:   Essential hypertension   DM type 2 (diabetes mellitus, type 2) (HCC)   Acute on chronic respiratory failure (HCC)   COPD GOLD 3/ group E  still smoking   PAF (paroxysmal atrial fibrillation) (HCC)   Malignant neoplasm of unspecified part of left bronchus or lung (HCC)   Acute on chronic hypoxic respiratory failure [facility])  Brief Hospital admission course: As per H&P written by Dr. Pearlean on 03/29/2024 Holly May is a 74 y.o. female with medical history significant for chronic respiratory failure on 5 L, atrial fibrillation, COPD, diabetes mellitus, hypertension. Patient presented to the ED with complaints of difficulty breathing started today, worse with exertion, and progressive bilateral lower extremity swelling.  Patient is not the best historian, she also does not have her dentures in, so her speech is a bit hard to understand but at baseline.  Reports compliance with Lasix  40 mg twice daily.  She does not check her weights regularly and is unsure of her baseline weight. No chest pain.  She reports intermittent cough.  Reports she is on 5 L of oxygen  because her O2 cord is long, otherwise she should be on 2 L.   ED Course: Heart rate 75-89, respiratory rate 15-24.  Blood pressure systolic 136-181.  O2 sats 92 to 100% on 5  L,.  Per EDP, with activity, sats desats to 80s on 5 L. ProBNP elevated at 3579.  WBC 12.8.  COVID influenza RSV negative.  Troponin 22. Chest x-ray pending. IV Lasix  40 mg x 1 given. IV Solu-Medrol  125 mg given and DuoNebs x 2. Hospitalist to admit for likely CHF exacerbation and COPD.  Assessment and Plan:   Acute on chronic hypoxic respiratory failure - Tachypnea with intermittent desaturations to the 80s.  Baseline O2 at 4-5 L.  Multifactorial etiology given underlying severe COPD, CHF, lung cancer.   -Condition overall improved and good oxygen  saturation on chronic supplementation appreciated at discharge.   Acute exacerbation of chronic HFpEF - Noted pitting, elevated BNP, congestion on chest x-ray.  - Excellent response to IV diuresis; Patient developed metabolic alkalosis and received Diamox. -At discharge adjusted dose of diuretics has been prescribed. - Patient advised to maintain adequate hydration, to follow heart healthy/low-sodium diet and to check weight on daily basis. - Outpatient follow-up with PCP has been recommended.   COPD  - Intermittent cough with reduced air movement however very poor lung function at baseline.  -Resume home bronchodilator management - Continue as needed mucolytics - Continue oxygen  supplementation.   Tobacco dependence - Likely exacerbating all of above.  Encourage patient to quit.  Patient states she has been smoking for over 50 years.   - Cessation counseling provided - Continue nicotine  patch; prescription provided at discharge.   Diabetes mellitus - Resume home hypoglycemic regimen - Modified carbohydrate diet and adequate hydration discussed with patient.   Hypertension/atrial fibrillation - Resume home medication regiment. -  Continue the use of Eliquis  for secondary prevention.   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  Currently under surveillance.   Consultants: None Procedures performed: See  below for x-ray reports. Disposition: Home Diet recommendation: Heart healthy/low-sodium and modified carbohydrate diet.  DISCHARGE MEDICATION: Allergies as of 04/01/2024   No Known Allergies      Medication List     STOP taking these medications    budesonide  0.25 MG/2ML nebulizer solution Commonly known as: Pulmicort    furosemide  20 MG tablet Commonly known as: LASIX        TAKE these medications    albuterol  108 (90 Base) MCG/ACT inhaler Commonly known as: VENTOLIN  HFA Inhale 2 puffs into the lungs every 4 (four) hours as needed for wheezing or shortness of breath (cough, shortness of breath or wheezing.).   apixaban  5 MG Tabs tablet Commonly known as: Eliquis  Take 1 tablet (5 mg total) by mouth 2 (two) times daily.   BC FAST PAIN RELIEF PO Take 2 packets by mouth daily.   bisoprolol  10 MG tablet Commonly known as: ZEBETA  Take 1 tablet (10 mg total) by mouth daily.   Blood Glucose Monitoring Suppl Devi 1 each by Does not apply route as directed. Dispense based on patient and insurance preference. Use up to four times daily as directed. (FOR ICD-10 E10.9, E11.9).   BLOOD GLUCOSE TEST STRIPS Strp 1 each by Does not apply route as directed. Dispense based on patient and insurance preference. Use up to four times daily as directed. (FOR ICD-10 E10.9, E11.9).   citalopram  40 MG tablet Commonly known as: CELEXA  Take 40 mg by mouth daily.   diltiazem  240 MG 24 hr capsule Commonly known as: CARDIZEM  CD Take 1 capsule (240 mg total) by mouth daily.   gabapentin  300 MG capsule Commonly known as: NEURONTIN  Take 300 mg by mouth at bedtime.   glipiZIDE  5 MG tablet Commonly known as: GLUCOTROL  Take 1 tablet (5 mg total) by mouth 2 (two) times daily before a meal.   ipratropium-albuterol  0.5-2.5 (3) MG/3ML Soln Commonly known as: DUONEB Take 3 mLs by nebulization every 6 (six) hours as needed (wheezing, shortness of breath).   Lancet Device Misc 1 each by Does not  apply route as directed. Dispense based on patient and insurance preference. Use up to four times daily as directed. (FOR ICD-10 E10.9, E11.9).   Lancets Misc 1 each by Does not apply route as directed. Dispense based on patient and insurance preference. Use up to four times daily as directed. (FOR ICD-10 E10.9, E11.9).   levothyroxine  25 MCG tablet Commonly known as: SYNTHROID  Take 1 tablet (25 mcg total) by mouth daily before breakfast.   lovastatin 40 MG tablet Commonly known as: MEVACOR Take 40 mg by mouth at bedtime.   mirtazapine  15 MG tablet Commonly known as: REMERON  Take 15 mg by mouth at bedtime. For appetite   naltrexone 50 MG tablet Commonly known as: DEPADE Take 25 mg by mouth daily.   nicotine  21 mg/24hr patch Commonly known as: NICODERM CQ  - dosed in mg/24 hours Place 1 patch (21 mg total) onto the skin daily. What changed:  when to take this reasons to take this   olmesartan  40 MG tablet Commonly known as: BENICAR  Take 1 tablet (40 mg total) by mouth daily.   oxybutynin  5 MG tablet Commonly known as: DITROPAN  Take 5 mg by mouth 3 (three) times daily.   pantoprazole  40 MG tablet Commonly known as: PROTONIX  Take 1  tablet (40 mg total) by mouth 2 (two) times daily.   potassium chloride  SA 20 MEQ tablet Commonly known as: KLOR-CON  M TAKE ONE TABLET BY MOUTH ONCE DAILY   torsemide 20 MG tablet Commonly known as: DEMADEX Take 3 tablets (60 mg total) by mouth daily. What changed: how much to take   Trelegy Ellipta 100-62.5-25 MCG/ACT Aepb Generic drug: Fluticasone -Umeclidin-Vilant Inhale 1 puff into the lungs daily.        Follow-up Information     Katrinka Aquas, MD. Schedule an appointment as soon as possible for a visit in 10 day(s).   Specialty: Internal Medicine Contact information: [address] [place] [address] [phone]                Discharge Exam: Fredricka Weights   03/30/24 1423 03/31/24 0500 04/01/24 0352   Weight: 74.1 kg 70.2 kg 69.7 kg   General exam: Alert, awake, oriented x 3; patient is speaking in full sentences and feeling ready to go home.  Denies orthopnea.  Good saturation on chronic supplementation. Respiratory system: No expiratory wheezing or frank crackles appreciated on exam. Cardiovascular system: Rate controlled, no rubs, no gallops, no JVD. Gastrointestinal system: Abdomen is nondistended, soft and nontender. No organomegaly or masses felt. Normal bowel sounds heard. Central nervous system: Generally weak.  No focal neurological deficits. Extremities: No cyanosis or clubbing; trace edema appreciated bilaterally. Skin: No petechiae. Psychiatry: Judgement and insight appear normal. Mood & affect appropriate.     Condition at discharge: Stable and improved.  The results of significant diagnostics from this hospitalization (including imaging, microbiology, ancillary and laboratory) are listed below for reference.   Imaging Studies: CT HEAD WO CONTRAST ( ) Result Date: 03/29/2024 EXAM: CT HEAD WITHOUT CONTRAST 03/29/2024 11:00:10 PM TECHNIQUE: CT of the head was performed without the administration of intravenous contrast. Automated exposure control, iterative reconstruction, and/or weight based adjustment of the mA/kV was utilized to reduce the radiation dose to as low as reasonably achievable. COMPARISON: 07/23/2023 CLINICAL HISTORY: Falls, trauma to head, on Eliquis . History of subdural hematoma. FINDINGS: BRAIN AND VENTRICLES: No acute hemorrhage. No evidence of acute infarct. No hydrocephalus. No extra-axial collection. No mass effect or midline shift. Generalized cerebral atrophy. Ill-defined hypoattenuation within cerebral white matter consistent with chronic small vessel ischemic disease, advanced for age, similar to prior study. 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 Intracranial arterial calcification. ORBITS: No acute abnormality. SINUSES: Mild mucosal  thickening in right maxillary sinus. Moderate bilateral mastoid effusions. SOFT TISSUES AND SKULL: No acute soft tissue abnormality. No skull fracture. IMPRESSION: 1. No acute intracranial abnormality. 2. Generalized cerebral atrophy and chronic small vessel ischemic disease, advanced for age, similar to prior study. Electronically signed by: Franky Stanford MD 03/29/2024 11:46 PM EST RP Workstation: [id number]   DG Chest Port 1 View Result Date: 03/29/2024 CLINICAL DATA:  Shortness of breath. EXAM: PORTABLE CHEST 1 VIEW COMPARISON:  01/17/2024 FINDINGS: The heart is within normal limits in size given the AP projection and portable technique. Stable tortuosity and calcification of the thoracic aorta. No acute pulmonary process. Linear scarring type changes noted in the left mid lung region. The bony thorax is intact. IMPRESSION: No acute cardiopulmonary findings. Electronically Signed   By: MYRTIS Stammer M.D.   On: 03/29/2024 22:08    Microbiology: Results for orders placed or performed during the hospital encounter of 03/29/24  Resp panel by RT-PCR (RSV, Flu A&B, Covid) Anterior Nasal Swab     Status: None  Collection Time: 03/29/24  8:15 PM   Specimen: Anterior Nasal Swab  Result Value Ref Range Status   SARS Coronavirus 2 by RT PCR NEGATIVE NEGATIVE Final    Comment: (NOTE) SARS-CoV-2 target nucleic acids are NOT DETECTED.  The SARS-CoV-2 RNA is generally detectable in upper respiratory specimens during the acute phase of infection. The lowest concentration of SARS-CoV-2 viral copies this assay can detect is 138 copies/mL. A negative result does not preclude SARS-Cov-2 infection and should not be used as the sole basis for treatment or other patient management decisions. A negative result may occur with  improper specimen collection/handling, submission of specimen other than nasopharyngeal swab, presence of viral mutation(s) within the areas targeted by this assay, and inadequate number of  viral copies(<138 copies/mL). A negative result must be combined with clinical observations, patient history, and epidemiological information. The expected result is Negative.  Fact Sheet for Patients:  bloggercourse.com  Fact Sheet for Healthcare Providers:  seriousbroker.it  This test is no t yet approved or cleared by the [place]  FDA and  has been authorized for detection and/or diagnosis of SARS-CoV-2 by FDA under an Emergency Use Authorization (EUA). This EUA will remain  in effect (meaning this test can be used) for the duration of the COVID-19 declaration under Section 564(b)(1) of the Act, 21 U.S.C.section 360bbb-3(b)(1), unless the authorization is terminated  or revoked sooner.       Influenza A by PCR NEGATIVE NEGATIVE Final   Influenza B by PCR NEGATIVE NEGATIVE Final    Comment: (NOTE) The Xpert Xpress SARS-CoV-2/FLU/RSV plus assay is intended as an aid in the diagnosis of influenza from Nasopharyngeal swab specimens and should not be used as a sole basis for treatment. Nasal washings and aspirates are unacceptable for Xpert Xpress SARS-CoV-2/FLU/RSV testing.  Fact Sheet for Patients: bloggercourse.com  Fact Sheet for Healthcare Providers: seriousbroker.it  This test is not yet approved or cleared by the [place]  FDA and has been authorized for detection and/or diagnosis of SARS-CoV-2 by FDA under an Emergency Use Authorization (EUA). This EUA will remain in effect (meaning this test can be used) for the duration of the COVID-19 declaration under Section 564(b)(1) of the Act, 21 U.S.C. section 360bbb-3(b)(1), unless the authorization is terminated or revoked.     Resp Syncytial Virus by PCR NEGATIVE NEGATIVE Final    Comment: (NOTE) Fact Sheet for Patients: bloggercourse.com  Fact Sheet for Healthcare  Providers: seriousbroker.it  This test is not yet approved or cleared by the [place]  FDA and has been authorized for detection and/or diagnosis of SARS-CoV-2 by FDA under an Emergency Use Authorization (EUA). This EUA will remain in effect (meaning this test can be used) for the duration of the COVID-19 declaration under Section 564(b)(1) of the Act, 21 U.S.C. section 360bbb-3(b)(1), unless the authorization is terminated or revoked.  Performed at [facility], [address] [place], [place] [address]     Labs: CBC: Recent Labs  Lab 03/29/24 2009 03/30/24 0321 03/31/24 0458  WBC 12.8* 11.5* 18.3*  NEUTROABS 7.5  --   --   HGB 10.0* 10.3* 8.8*  HCT 34.8* 35.1* 29.6*  MCV 89.0 86.9 85.8  PLT 300 338 313   Basic Metabolic Panel: Recent Labs  Lab 03/29/24 2009 03/30/24 0321 03/31/24 0458 04/01/24 0525  NA 139 140 137 139  K 3.9 3.9 3.8 3.2*  CL 95* 94* 90* 93*  CO2 28 39* >45* 42*  GLUCOSE 118* 188* 163* 115*  BUN 16 16 24*  23  CREATININE 1.13* 1.13* 1.27* 1.27*  CALCIUM 9.3 9.4 9.0 9.1  MG  --   --   --  2.3   Liver Function Tests: Recent Labs  Lab 03/29/24 2009  AST 15  ALT <5  ALKPHOS 102  BILITOT 0.4  PROT 6.7  ALBUMIN  3.7   CBG: Recent Labs  Lab 03/31/24 1208 03/31/24 1643 03/31/24 2016 04/01/24 0737 04/01/24 1147  GLUCAP 172* 115* 138* 125* 204*    Discharge time spent:  35 minutes.  Signed: Eric Nunnery, MD [facility] 04/01/2024 "

## 2024-04-01 NOTE — Progress Notes (Signed)
 Patient being discharged home per MD orders. AVS teaching provided. Family to transport patient home with patients home o2. Denies any further complaints. PIV and telemetry removed.

## 2024-04-02 NOTE — Telephone Encounter (Signed)
 Eliquis  5mg  refill request received. Patient is 75 years old, weight-69.7kg, Crea-1.27 on 04/01/24, Diagnosis-Afib, and last seen by Dr. Mallipeddi on 01/17/23-NEEDS AN APPT. Dose is appropriate based on dosing criteria.

## 2024-04-05 NOTE — Progress Notes (Deleted)
" °  Cardiology Office Note:  .   Date:  04/05/2024  ID:  Holly May, DOB May 02, 1950, MRN [id number] PCP: Holly Aquas, MD  [facility] Providers Cardiologist:  Holly SHAUNNA Maywood, MD { Click to update primary MD,subspecialty MD or APP then REFRESH:1}   History of Present Illness: Holly   Holly May is a 74 y.o. female with a past medical history significant for paroxysmal atrial fibrillation, diastolic heart failure, alcohol use disorder, COPD, HTN, T2DM, depression and adenocarcinoma of LUL.  Patient was hospitalized 10/2023 with PAF with RVR in setting of alcohol intoxication.   ROS: ***  Studies Reviewed: Holly         Prior CV Studies: {Select studies to display:26339}  Echocardiogram: 11/05/2023 Summary   1. Technically difficult study.   2. The left ventricle is normal in size with normal wall thickness.   3. The left ventricular systolic function is normal, LVEF is visually estimated at 55-60%.   4. The left atrium is mildly dilated in size.   5. The right ventricle is normal in size, with normal systolic function.   6. IVC size and inspiratory change suggest mildly elevated right atrial pressure. (5-10 mmHg).    Risk Assessment/Calculations:   {Does this patient have ATRIAL FIBRILLATION?:[phone]} No BP recorded.  {Refresh Note OR Click here to enter BP  :1}***       Physical Exam:   VS:  There were no vitals taken for this visit.   Orhtostatics: No data found. Wt Readings from Last 3 Encounters:  04/01/24 153 lb 10.6 oz (69.7 kg)  02/09/24 160 lb 9.6 oz (72.8 kg)  01/20/24 170 lb 6.7 oz (77.3 kg)    GEN: Well nourished, well developed in no acute distress NECK: No JVD; No carotid bruits CARDIAC: ***RRR, no murmurs, rubs, gallops RESPIRATORY:  Clear to auscultation without rales, wheezing or rhonchi  ABDOMEN: Soft, non-tender, non-distended EXTREMITIES:  No edema; No deformity   ASSESSMENT AND PLAN: .    PAF on eliquis  and bisoprolol   Severe  COPD/lung adenocarcinoma  Nicotine  abuse  HTN     {Are you ordering a CV Procedure (e.g. stress test, cath, DCCV, TEE, etc)?   Press F2        :[id number]}  Dispo: ***  Signed, Holly Pavy, PA-C   "

## 2024-04-05 NOTE — Telephone Encounter (Signed)
 Message sent to Darryle Glance to schedule pt an appt with Dr Mallipeddi.  Refill approved x 3.

## 2024-04-06 ENCOUNTER — Ambulatory Visit: Attending: Physician Assistant | Admitting: Physician Assistant

## 2024-04-12 ENCOUNTER — Inpatient Hospital Stay: Admitting: Physician Assistant

## 2024-04-14 ENCOUNTER — Ambulatory Visit: Admitting: Internal Medicine

## 2024-04-14 ENCOUNTER — Encounter: Payer: Self-pay | Admitting: Internal Medicine

## 2024-04-14 VITALS — BP 107/69 | HR 64 | Ht 67.0 in | Wt 163.0 lb

## 2024-04-14 DIAGNOSIS — G4734 Idiopathic sleep related nonobstructive alveolar hypoventilation: Secondary | ICD-10-CM

## 2024-04-14 DIAGNOSIS — R0902 Hypoxemia: Secondary | ICD-10-CM

## 2024-04-14 DIAGNOSIS — J449 Chronic obstructive pulmonary disease, unspecified: Secondary | ICD-10-CM | POA: Diagnosis not present

## 2024-04-14 DIAGNOSIS — F17211 Nicotine dependence, cigarettes, in remission: Secondary | ICD-10-CM | POA: Diagnosis not present

## 2024-04-14 DIAGNOSIS — R918 Other nonspecific abnormal finding of lung field: Secondary | ICD-10-CM

## 2024-04-14 NOTE — Assessment & Plan Note (Addendum)
 Quit smoking 04/2024 (hopefully sticks with it)  - PFT's  02/16/2019  FEV1 0.87 (39 % ) ratio 0.39  p 7 % improvement from saba p ? prior to study with DLCO  10.88 (49%)   and FV curve classically concave  - 04/05/2022  After extensive coaching inhaler device,  effectiveness =    75% with dpi > trelegy trial  - d/c ACE 04/05/2022  - 04/05/2022   Walked on RA  x  3  lap(s) =  approx 450  ft  @ mod pace, stopped due to end of study with lowest 02 sats 95% no sob   - PFT's  06/03/22  FEV1 0.95 (38 % ) ratio 0.54  p 0 % improvement from saba p 0 prior to study with DLCO  10.31 (49%)   and FV curve mildly concave/somewhat atyical and ERV 6% at wt 144   - 02/11/2023  After extensive coaching inhaler device,  effectiveness =    75% with dpi, 25% with hfa  - 02/11/2023 try pred 20 until better then 10 mg daily x 5 d and off>>> much better  - 02/11/2023    d/c acei  - 11/27/2023 new ABC with A= duoneb/bud 0.25 and C = prednisone   20 until better then 10 mg daily x 5 d and off - 01/12/2024  After extensive coaching inhaler device,  effectiveness =    50% with hfa and dpi so continue duoneb/bud 0.25 mg up to 4 x daily as plan D instead of prednisone    May have component of pulmonary edema/ cardiac asthma as hard to exclude but predominantly she Is Group E in terms of symptoms/risk so  laba/lama/ICS  therefore appropriate rx at this point >>>  trelegy plus more    approp SABA prn. And avoid OCS if possible  Re SABA :  I spent extra time with pt today reviewing appropriate use of albuterol  for prn use on exertion with the following points: 1) saba is for relief of sob that does not improve by walking a slower pace or resting but rather if the pt does not improve after trying this first. 2) If the pt is convinced, as many are, that saba helps recover from activity faster then it's easy to tell if this is the case by re-challenging : ie stop, take the inhaler, then p 5 minutes try the exact same activity (intensity of  workload) that just caused the symptoms and see if they are substantially diminished or not after saba 3) if there is an activity that reproducibly causes the symptoms, try the saba 15 min before the activity on alternate days   If in fact the saba really does help, then fine to continue to use it prn but advised may need to look closer at the maintenance regimen (trelegy) being used to achieve better control of airways disease with exertion.

## 2024-04-14 NOTE — Patient Instructions (Signed)
 No change on your medications   Make sure you check your oxygen  saturation  AT  your highest level of activity (not after you stop)   to be sure it stays over 90% and adjust  02 flow upward to maintain this level if needed but remember to turn it back to previous settings when you stop (to conserve your supply).   Congratulations on not smoking   Please schedule a follow up visit in 3 months but call sooner if needed

## 2024-04-14 NOTE — Progress Notes (Signed)
 "   Holly May, female    DOB: [birth year]    MRN: [id number]   Brief patient profile:  74  yobf  quit smoking 04/06/24   referred to pulmonary clinic in [employer]  04/05/2022 by [facility]  for SPN in setting  variable doe attributed to  Copd / AB  x  2017 .  S/p thymectomy by Holly May 2020 s adjuctive rx with preop pfts c/w GOLD 3 copd    History of Present Illness  04/05/2022  Pulmonary/ 1st office eval/ [facility] / [facility] on acei and DPI  Chief Complaint  Patient presents with   Consult    SOB   Dyspnea:  mb and back 293ft  each way level and so short of breath  sometimes has to stop but varies s pattern  Cough: clear mucus variably producttive esp p lie down sometimes wakes her up p asleep/ flat 2 pillows  SABA use: 3 x weekly  02: none Rec We will walk you today to see if you qualify for 0xygen  Start Trelegy 100 one click each For cough / congestion > mucinex  1200 mg every 12 hours as needed  Levaquin  500 mg one daily x 10 days  CT chest needs to be done after Feb 3rd 2024  The key is to stop smoking completely before smoking completely stops you! 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   02/11/2023  f/u ov/[facility] office/[facility] re: GOLD 3  maint on trelegy 100  did  bring meds (includes lisinopril  40 and fosamax)  Chief Complaint  Patient presents with   COPD GOLD 3/ group E still smoking   Dyspnea:  ? A little better, still doing [employer]/ uses [facility]  Cough: worse since last ov esp when lie down > min mucoid Sleeping: flat bed/ 2 pillows s    resp cc  SABA use: q 2-3 days  02: none  Rec Stop lisinopril   Plan A = Automatic = Always=  Trelegy 100 one click  each AM   - take your time! Work on inhaler technique:  Plan B = Backup (to supplement plan A, not to replace it) Only use your albuterol  inhaler as a rescue medication  Plan C = Crisis (instead of Plan B but only if Plan B stops working) - only use your albuterol  nebulizer if you first try Plan B  Stop lisinopril  and replace with benicar  (olemsartan)  40 mg one daily   Prednisone  10 mg  2 daily until better and one daily for a week and stop     Admit date: 05/30/2023 Discharge date: 06/06/2023  Discharge Diagnoses:  Principal Problem:   Sepsis (HCC)   Urinary tract infection   CAP (community acquired pneumonia)   COPD exacerbation (HCC)   Acute respiratory failure with hypoxia (HCC)   Renal insufficiency   Prolonged QT interval   Essential hypertension   History of lung cancer   DM type 2 (diabetes mellitus, type 2) (HCC)   Left hip pain   Fall at home, initial encounter   GERD (gastroesophageal reflux disease)   Cigarette smoker  08/29/2023  f/u ov/[facility] office/Holly May re: GOLD 3 / 02 prn  maint on BREO  did not bring all meds/ still smoking  Chief Complaint  Patient presents with   COPD  Dyspnea:  back again walking at [employer] with [facility] parking s 02  Cough: minimal esp in am and hs mostly clear  Sleeping: flat bed 2 pillows s  resp cc  SABA use: neb twice daily  02: 2lpm hs but not every night  /all nigh Lung cancer screening:  [facility]  Rec Plan A = Automatic = Always=    BRE0 100 one click each am  Plan B = Backup (to supplement plan A, not to replace it) Only use your albuterol  inhaler as a rescue medication  Plan C = Crisis (instead of Plan B but only if Plan B stops working) - only use your albuterol  nebulizer if you first try Plan B   The key is to stop smoking completely before smoking completely stops you!    Admit Date: 11/03/2023  Discharge Date: 11/07/2023    Discharge Diagnoses:  Principal Problem (Resolved):   Atrial fibrillation with RVR    (POA:  Unknown)   Alcohol intoxication (POA: Yes)   COPD (chronic obstructive pulmonary disease)    (POA: Unknown)   Chronic hypoxic respiratory failure    (POA: Unknown)   Hypertension (POA: Unknown)   Tobacco use disorder (POA: Yes)    11/27/2023  f/u ov/[facility] office/Holly May re: GOLD 3 / 02 prn   maint on Trelegy but not affordable  Chief Complaint  Patient presents with   COPD    Coughing w clear mucus Shob worse then usual   Dyspnea:  walking at [employer] s 02  Cough: better / min clear mucus  Sleeping: bed is flat 2 pillows s    resp cc  SABA use: about twic duone  02: 2lpm @ hs / not consistent with it/ no using daytime Lung cancer screening: follow by oncology  Rec Plan A = Automatic = Always=    Change   duoneb to 2-4 x daily with budesonide  0.25 with each treatment  Plan B = Backup (to supplement plan A, not to replace it) Use your albuterol  inhaler as a rescue medication  Plan C = Crisis (instead of Plan B but only if Plan B stops working) Plan D  if ABC not  working add Prednisone  10 mg x 2 each am until better then 1 daily x 5 days and stop (refillable) Please schedule a follow up visit in 6 months but call sooner if needed    01/12/2024  f/u ov/[facility] office/Holly May re: GOLD 3 copd  maint on trelegy 100 and back up neb but not using / following ABCD plan above Chief Complaint  Patient presents with   COPD    Abn ct from [facility]  Dyspnea:  very hard to understand   - very sedentary, thinks she needs amb 02  Cough: none  Sleeping: bed is flat / 2 pillows s  resp cc  SABA use: 2 x daily  02: concentrator on 2lpm hs  and  as needed during the day  Patient Instructions  My office will be contacting you by phone for referral to oncology at [facility]   - if you don't hear back from my office within one week please call us  back or notify us  thru MyChart and we'll address it right away.  Plan A = Automatic = Always=    Trelegy one  click each am  Plan B = Backup (to supplement plan  A, not to replace it) Use your albuterol  inhaler as a rescue medication  Plan C  -  if A and B not working add nebulizer with duoneb  plus budesonide  0.25 mg up to every 4 hours if needed Plan D  if ABC not working : add   prednisone  10 m x 2 daily until better then one daily for 5 days and stop  Please schedule a follow up visit in 3 months but call sooner if needed  with all RESPIRATORY medications /inhalers/ solutions in hand      Admit date:     01/14/2024  Discharge date: 01/20/24    Hospital Course: 74 year old with past medical history significant for lung cancer, COPD on home oxygen , A-fib, depression, hypertension, diabetes type 2 sent from cancer center for evaluation of hypoxia.  Patient uses 2 L of oxygen  at home in the office she was seen noted to have oxygen  saturation in the 80s.  Patient has a history of A-fib she was noted to be tachycardic in the office as well.  She was treated outside hospital for pneumonia recently and discharged on Friday.  She is still taking his steroids.   Evaluation in the ED she received breathing treatment and IV steroid for wheezing.  Started on Cardizem  drip.  proBNP noted to be 3000 troponin 21.  Chest x-ray negative for acute cardiopulmonary abnormality.     Patient admitted for COPD exacerbation and A-fib with RVR     Assessment and Plan: 1-A-fib with RVR -Suspect exacerbated by lung issues.  - Continue with Eliquis  -Continue with  oral Cardizem , bisoprolol /      COPD exacerbation -Continue with  guaifenesin  -Continue with ceftriaxone  and doxycycline , treated for 5 days.  -Continue with  Brovana  and Pulmicort  -Continue DuoNeb -Counseling in regards to smoking cessation -IV solumedrol--change to prednisone , discharge on 5 days.  -10/18: She was more tachypnea, extra breathing tx, chest x ray showed right lower lobe consolidation, incentive spirometry, respiratory toilet. CCM consulted.  -Considering episode of aspirations. Speech  consulted. IV lasix  resume  -monitor on oral prednisone .  Resume oral lasix   Stable for discharge   Diabetes type 2  Hyperglycemia - Suspect in the setting of a steroid rx - A1c 8 She will need meds or insulin  at discharge Continue Lantus  and sliding scale and meal coverage.     Hyperkalemia:  Treated with  Lokelma    Lung cancer, left upper lobe adenocarcinoma.  - Continue follow-up outpatient   Hypertension - Continue with Cardizem    Hyperlipidemia - Resume statin   CKD stage III - Monitor renal function   Leukocytosis in setting steroids.  Chronic heart failure preserved ejection fraction - resume lasix .    Low TSH: Free T3 Pending free T4: 0.58.  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  She will need repeat TSH and free T4 in 4 weeks     Overreactive bladder; Oxybutynin .    02/09/2024  f/u ov/[place] office/Delta Deshmukh re: GOLD 3 COPD  maint on 2lpm hs and prn daytime    still smoking  and did not bring meds as requested / does not know any names ? On duoneb qid/ bud bid ?  Chief Complaint  Patient presents with   Hospitalization Follow-up    pna   COPD  Dyspnea:  room  to room  Cough:  lots of congestion > light brown nl  for pt  Sleeping: bed is flat/ 2 pillows s resp cc  SABA use: has nebulizer not using  02: 3lpm  24/7 does not titrate with pulse ox  Rec Patient Instructions  No change in medications but there is no  reason to return here until / unless we can be  sure what medications you are taking and which ones you are taking daily vs as needed Ok to adjust your 02 to keep your oxygen  level on your finger around 90%  or better on whatever equipment your are on - remember to turn it back down if you are having to turn it up to walking  The key is to stop smoking completely before smoking completely stops you! Please schedule a follow up office visit in 4 weeks, call sooner if needed with all medications  /inhalers/ solutions in hand  Admit date:     03/29/2024  Discharge date: 04/01/2024  Discharge Physician: Eric Nunnery    PCP: Katrinka Aquas, MD    Recommendations at discharge:  Repeat basic metabolic panel to follow electrolytes and renal function Continue close monitoring of patient's CBG fluctuation with further adjustment to hypoglycemic regimen as needed Reassess blood pressure and adjust antihypertensive remain as required Continue assisting patient with smoking cessation.   Discharge Diagnoses: Principal Problem:   Acute on chronic diastolic CHF (congestive heart failure) (HCC)   Essential hypertension   DM type 2 (diabetes mellitus, type 2) (HCC)   Acute on chronic respiratory failure (HCC)   COPD GOLD 3/ group E  still smoking   PAF (paroxysmal atrial fibrillation) (HCC)   Malignant neoplasm of unspecified part of left bronchus or lung (HCC)   Acute on chronic hypoxic respiratory failure [facility])   Brief Hospital admission course: As per H&P written by Holly. Pearlean on 03/29/2024 JANNY CRUTE is a 74 y.o. female with medical history significant for chronic respiratory failure on 5 L, atrial fibrillation, COPD, diabetes mellitus, hypertension. Patient presented to the ED with complaints of difficulty breathing started today, worse with exertion, and progressive bilateral lower extremity swelling.  Patient is not the best historian, she also does not have her dentures in, so her speech is a bit hard to understand but at baseline.  Reports compliance with Lasix  40 mg twice daily.  She does not check her weights regularly and is unsure of her baseline weight. No chest pain.  She reports intermittent cough.  Reports she is on 5 L of oxygen  because her O2 cord is long, otherwise she should be on 2 L.   ED Course: Heart rate 75-89, respiratory rate 15-24.  Blood pressure systolic 136-181.  O2 sats 92 to 100% on 5 L,.  Per EDP, with activity, sats desats to 80s on 5 L. ProBNP elevated  at 3579.  WBC 12.8.  COVID influenza RSV negative.  Troponin 22. Chest x-ray pending. IV Lasix  40 mg x 1 given. IV Solu-Medrol  125 mg given and DuoNebs x 2. Hospitalist to admit for likely CHF exacerbation and COPD.   Assessment and Plan:   Acute on chronic hypoxic respiratory failure - Tachypnea with intermittent desaturations to the 80s.  Baseline O2 at 4-5 L.  Multifactorial etiology given underlying severe COPD, CHF, lung cancer.   -Condition overall improved and good oxygen  saturation on chronic supplementation appreciated at discharge.   Acute exacerbation of chronic HFpEF - Noted pitting, elevated BNP, congestion on chest x-ray.  - Excellent response to IV diuresis; Patient developed metabolic alkalosis and received Diamox . -  At discharge adjusted dose of diuretics has been prescribed. - Patient advised to maintain adequate hydration, to follow heart healthy/low-sodium diet and to check weight on daily basis. - Outpatient follow-up with PCP has been recommended.   COPD  - Intermittent cough with reduced air movement however very poor lung function at baseline.  -Resume home bronchodilator management - Continue as needed mucolytics - Continue oxygen  supplementation.   Tobacco dependence - Likely exacerbating all of above.  Encourage patient to quit.  Patient states she has been smoking for over 50 years.   - Cessation counseling provided - Continue nicotine  patch; prescription provided at discharge.   Diabetes mellitus - Resume home hypoglycemic regimen - Modified carbohydrate diet and adequate hydration discussed with patient.   Hypertension/atrial fibrillation - Resume home medication regiment. - Continue the use of Eliquis  for secondary prevention.   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  Currently under surveillance.     04/14/2024  f/u ov/[place] office/Irvin Bastin re:  GOLD 3 COPD  maint on 2lpm hs and prn daytime maint on Trelegy 100    no longer  smoker  did  bring  meds  Chief Complaint  Patient presents with   COPD    Shob doing okay - quit smoking 1 week ago feels 100% better   Dyspnea:  room to room on rollator/ [place] 2 weeks s 02   Cough: better since quit  Sleeping: bed is flat/ 3 pillows  s  resp cc  SABA use: up to 6 x daily  02: 2-4 lpm hs and prn daytime    No obvious day to day or daytime variability or assoc excess/ purulent sputum or mucus plugs or hemoptysis or cp or chest tightness, subjective wheeze or overt sinus or hb symptoms.    Also denies any obvious fluctuation of symptoms with weather or environmental changes or other aggravating or alleviating factors except as outlined above   No unusual exposure hx or h/o childhood pna/ asthma or knowledge of premature birth.  Current Allergies, Complete Past Medical History, Past Surgical History, Family History, and Social History were reviewed in [employer] record.  ROS  The following are not active complaints unless bolded Hoarseness, sore throat, dysphagia, dental problems, itching, sneezing,  nasal congestion or discharge of excess mucus or purulent secretions, ear ache,   fever, chills, sweats, unintended wt loss or wt gain, classically pleuritic or exertional cp,  orthopnea pnd or arm/hand swelling  or leg swelling, presyncope, palpitations, abdominal pain, anorexia, nausea, vomiting, diarrhea  or change in bowel habits or change in bladder habits, change in stools or change in urine, dysuria, hematuria,  rash, arthralgias, visual complaints, headache, numbness, weakness or ataxia or problems with walking/uses rollator or coordination,  change in mood or  memory.         Outpatient Medications Prior to Visit  Medication Sig Dispense Refill   albuterol  (PROVENTIL  HFA;VENTOLIN  HFA) 108 (90 Base) MCG/ACT inhaler Inhale 2 puffs into the lungs every 4 (four) hours as needed for wheezing or shortness of breath (cough, shortness of  breath or wheezing.). 1 Inhaler 3   apixaban  (ELIQUIS ) 5 MG TABS tablet TAKE ONE TABLET BY MOUTH TWICE DAILY 60 tablet 2   Aspirin-Caffeine (BC FAST PAIN RELIEF PO) Take 2 packets by mouth daily.     bisoprolol  (ZEBETA ) 10 MG tablet Take 1 tablet (10 mg total) by mouth daily. 30 tablet 2   Blood Glucose Monitoring Suppl DEVI 1  each by Does not apply route as directed. Dispense based on patient and insurance preference. Use up to four times daily as directed. (FOR ICD-10 E10.9, E11.9). 1 each 0   citalopram  (CELEXA ) 40 MG tablet Take 40 mg by mouth daily.     diltiazem  (CARDIZEM  CD) 240 MG 24 hr capsule Take 1 capsule (240 mg total) by mouth daily. 30 capsule 3   gabapentin  (NEURONTIN ) 300 MG capsule Take 300 mg by mouth at bedtime.     glipiZIDE  (GLUCOTROL ) 5 MG tablet Take 1 tablet (5 mg total) by mouth 2 (two) times daily before a meal. 60 tablet 0   Glucose Blood (BLOOD GLUCOSE TEST STRIPS) STRP 1 each by Does not apply route as directed. Dispense based on patient and insurance preference. Use up to four times daily as directed. (FOR ICD-10 E10.9, E11.9). 100 strip 0   ipratropium-albuterol  (DUONEB) 0.5-2.5 (3) MG/3ML SOLN Take 3 mLs by nebulization every 6 (six) hours as needed (wheezing, shortness of breath). 360 mL 0   Lancet Device MISC 1 each by Does not apply route as directed. Dispense based on patient and insurance preference. Use up to four times daily as directed. (FOR ICD-10 E10.9, E11.9). 1 each 0   Lancets MISC 1 each by Does not apply route as directed. Dispense based on patient and insurance preference. Use up to four times daily as directed. (FOR ICD-10 E10.9, E11.9). 100 each 0   levothyroxine  (SYNTHROID ) 25 MCG tablet Take 1 tablet (25 mcg total) by mouth daily before breakfast. 30 tablet 1   lovastatin (MEVACOR) 40 MG tablet Take 40 mg by mouth at bedtime.     mirtazapine  (REMERON ) 15 MG tablet Take 15 mg by mouth at bedtime. For appetite     naltrexone (DEPADE) 50 MG tablet  Take 25 mg by mouth daily.     nicotine  (NICODERM CQ  - DOSED IN MG/24 HOURS) 21 mg/24hr patch Place 1 patch (21 mg total) onto the skin daily. 28 patch 1   olmesartan  (BENICAR ) 40 MG tablet Take 1 tablet (40 mg total) by mouth daily. 30 tablet 11   oxybutynin  (DITROPAN ) 5 MG tablet Take 5 mg by mouth 3 (three) times daily.     pantoprazole  (PROTONIX ) 40 MG tablet Take 1 tablet (40 mg total) by mouth 2 (two) times daily. 60 tablet 0   potassium chloride  SA (KLOR-CON  M) 20 MEQ tablet TAKE ONE TABLET BY MOUTH ONCE DAILY 30 tablet 3   torsemide  (DEMADEX ) 20 MG tablet Take 3 tablets (60 mg total) by mouth daily. 90 tablet 2   TRELEGY ELLIPTA  100-62.5-25 MCG/ACT AEPB Inhale 1 puff into the lungs daily. 60 each 1   No facility-administered medications prior to visit.        Past Medical History:  Diagnosis Date   Arthritis    COPD (chronic obstructive pulmonary disease) (HCC)    Depression    Dyspnea    Essential hypertension, benign    Type 2 diabetes mellitus (HCC)    Urinary incontinence        Objective:    Wts  04/14/2024        163  02/09/2024       161 01/12/2024       157  11/27/2023         144  08/29/2023         144  03/18/2023       141   02/11/2023       140  12/31/2022  136   08/21/2022        143  05/16/22 135 lb 12.8 oz (61.6 kg)  05/14/22 134 lb (60.8 kg)  04/05/22 134 lb 6.4 oz (61 kg)   Vital signs reviewed  04/14/2024  - Note at rest 02 sats  97% on RA   General appearance:    amb (with rollator) hoarse  bf very difficult to understand speech focued  on getting more prednisone      HEENT :  Oropharynx  edentulous   Nasal turbinates nl    NECK :  without JVD/Nodes/TM/ nl carotid upstrokes bilaterally   LUNGS: no acc muscle use,  Mod barrel  contour chest wall with bilateral  Distant bs s audible wheeze and  without cough on insp or exp maneuvers and mod  Hyperresonant  to  percussion bilaterally     CV:  RRR  no s3 or murmur or increase in P2, and   trace L > R  pitting edema   ABD: obese  soft and nontender   MS:   Ext warm without deformities or   obvious joint restrictions , calf tenderness, cyanosis or clubbing  SKIN: warm and dry without lesions    NEURO:  alert, approp, nl sensorium with  no motor or cerebellar deficits apparent.             I personally reviewed images and agree with radiology impression as follows:  CXR:   pa and lateral   03/29/24  No acute cardiopulmonary findings.   Assessment     Assessment & Plan COPD GOLD 3/ group E  still smoking Quit smoking 04/2024 (hopefully sticks with it)  - PFT's  02/16/2019  FEV1 0.87 (39 % ) ratio 0.39  p 7 % improvement from saba p ? prior to study with DLCO  10.88 (49%)   and FV curve classically concave  - 04/05/2022  After extensive coaching inhaler device,  effectiveness =    75% with dpi > trelegy trial  - d/c ACE 04/05/2022  - 04/05/2022   Walked on RA  x  3  lap(s) =  approx 450  ft  @ mod pace, stopped due to end of study with lowest 02 sats 95% no sob   - PFT's  06/03/22  FEV1 0.95 (38 % ) ratio 0.54  p 0 % improvement from saba p 0 prior to study with DLCO  10.31 (49%)   and FV curve mildly concave/somewhat atyical and ERV 6% at wt 144   - 02/11/2023  After extensive coaching inhaler device,  effectiveness =    75% with dpi, 25% with hfa  - 02/11/2023 try pred 20 until better then 10 mg daily x 5 d and off>>> much better  - 02/11/2023    d/c acei  - 11/27/2023 new ABC with A= duoneb/bud 0.25 and C = prednisone   20 until better then 10 mg daily x 5 d and off - 01/12/2024  After extensive coaching inhaler device,  effectiveness =    50% with hfa and dpi so continue duoneb/bud 0.25 mg up to 4 x daily as plan D instead of prednisone    May have component of pulmonary edema/ cardiac asthma as hard to exclude but predominantly she Is Group E in terms of symptoms/risk so  laba/lama/ICS  therefore appropriate rx at this point >>>  trelegy plus more    approp SABA prn. And avoid  OCS if possible  Re SABA :  I spent extra time  with pt today reviewing appropriate use of albuterol  for prn use on exertion with the following points: 1) saba is for relief of sob that does not improve by walking a slower pace or resting but rather if the pt does not improve after trying this first. 2) If the pt is convinced, as many are, that saba helps recover from activity faster then it's easy to tell if this is the case by re-challenging : ie stop, take the inhaler, then p 5 minutes try the exact same activity (intensity of workload) that just caused the symptoms and see if they are substantially diminished or not after saba 3) if there is an activity that reproducibly causes the symptoms, try the saba 15 min before the activity on alternate days   If in fact the saba really does help, then fine to continue to use it prn but advised may need to look closer at the maintenance regimen (trelegy) being used to achieve better control of airways disease with exertion.    Nocturnal hypoxemia As of 08/29/2023 on 2lpm hs  - 08/29/2023   Walked on RA  x  2  lap(s) =  approx 300  ft  @ slow pace, stopped due to sob  with lowest 02 sats 97%   - 01/12/2024   Walked on RA  x  2  lap(s) =  approx 300  ft  @ slow  pace, stopped due to knee pain  with lowest 02 sats  89%  - 04/14/2024   Walked on RA  x  2  lap(s) =  approx 300   ft  @ slow  pace, stopped due to sob with  lowest 02 sats 90%    Ok to adjust noct 2-4lpm but during the day should be titrating with pulse ox to sats > 90% esp when exerting         Each maintenance medication was reviewed in detail including emphasizing most importantly the difference between maintenance and prns and under what circumstances the prns are to be triggered using an action plan format where appropriate.  Total time for H and P, chart review, counseling, reviewing hfa/ dpi/ neb/ 02/ pulse ox  device(s) and generating customized AVS unique to this office visit / same day  charting = 41 min post hosp f/u          AVS  Patient Instructions  No change on your medications   Make sure you check your oxygen  saturation  AT  your highest level of activity (not after you stop)   to be sure it stays over 90% and adjust  02 flow upward to maintain this level if needed but remember to turn it back to previous settings when you stop (to conserve your supply).   Congratulations on not smoking   Please schedule a follow up visit in 3 months but call sooner if needed    Ozell America, MD 04/14/2024                 "

## 2024-04-14 NOTE — Assessment & Plan Note (Addendum)
 As of 08/29/2023 on 2lpm hs  - 08/29/2023   Walked on RA  x  2  lap(s) =  approx 300  ft  @ slow pace, stopped due to sob  with lowest 02 sats 97%   - 01/12/2024   Walked on RA  x  2  lap(s) =  approx 300  ft  @ slow  pace, stopped due to knee pain  with lowest 02 sats  89%  - 04/14/2024   Walked on RA  x  2  lap(s) =  approx 300   ft  @ slow  pace, stopped due to sob with  lowest 02 sats 90%    Ok to adjust noct 2-4lpm but during the day should be titrating with pulse ox to sats > 90% esp when exerting         Each maintenance medication was reviewed in detail including emphasizing most importantly the difference between maintenance and prns and under what circumstances the prns are to be triggered using an action plan format where appropriate.  Total time for H and P, chart review, counseling, reviewing hfa/ dpi/ neb/ 02/ pulse ox  device(s) and generating customized AVS unique to this office visit / same day charting = 41 min post hosp f/u

## 2024-04-26 ENCOUNTER — Inpatient Hospital Stay: Admitting: Physician Assistant

## 2024-06-14 ENCOUNTER — Other Ambulatory Visit (HOSPITAL_COMMUNITY)

## 2024-06-14 ENCOUNTER — Inpatient Hospital Stay: Attending: Hematology

## 2024-06-30 ENCOUNTER — Inpatient Hospital Stay: Admitting: Physician Assistant

## 2024-07-14 ENCOUNTER — Ambulatory Visit: Admitting: Internal Medicine
